# Patient Record
Sex: Female | Born: 1935 | Race: White | Hispanic: No | Marital: Married | Smoking: Former smoker
Health system: Southern US, Community
[De-identification: ages and names within clinical notes are randomized; demographics above are authoritative.]

## PROBLEM LIST (undated history)

## (undated) DIAGNOSIS — D239 Other benign neoplasm of skin, unspecified: Secondary | ICD-10-CM

## (undated) DIAGNOSIS — D649 Anemia, unspecified: Secondary | ICD-10-CM

## (undated) DIAGNOSIS — F32A Depression, unspecified: Secondary | ICD-10-CM

## (undated) DIAGNOSIS — IMO0001 Reserved for inherently not codable concepts without codable children: Secondary | ICD-10-CM

## (undated) DIAGNOSIS — E039 Hypothyroidism, unspecified: Secondary | ICD-10-CM

## (undated) DIAGNOSIS — T7840XA Allergy, unspecified, initial encounter: Secondary | ICD-10-CM

## (undated) DIAGNOSIS — N184 Chronic kidney disease, stage 4 (severe): Secondary | ICD-10-CM

## (undated) DIAGNOSIS — K219 Gastro-esophageal reflux disease without esophagitis: Secondary | ICD-10-CM

## (undated) DIAGNOSIS — I509 Heart failure, unspecified: Secondary | ICD-10-CM

## (undated) DIAGNOSIS — Z942 Lung transplant status: Secondary | ICD-10-CM

## (undated) DIAGNOSIS — I10 Essential (primary) hypertension: Secondary | ICD-10-CM

## (undated) DIAGNOSIS — I82409 Acute embolism and thrombosis of unspecified deep veins of unspecified lower extremity: Secondary | ICD-10-CM

## (undated) DIAGNOSIS — F419 Anxiety disorder, unspecified: Secondary | ICD-10-CM

## (undated) DIAGNOSIS — IMO0002 Reserved for concepts with insufficient information to code with codable children: Secondary | ICD-10-CM

## (undated) DIAGNOSIS — J439 Emphysema, unspecified: Secondary | ICD-10-CM

## (undated) DIAGNOSIS — E785 Hyperlipidemia, unspecified: Secondary | ICD-10-CM

## (undated) DIAGNOSIS — R9430 Abnormal result of cardiovascular function study, unspecified: Secondary | ICD-10-CM

## (undated) DIAGNOSIS — F329 Major depressive disorder, single episode, unspecified: Secondary | ICD-10-CM

## (undated) DIAGNOSIS — E2740 Unspecified adrenocortical insufficiency: Secondary | ICD-10-CM

## (undated) DIAGNOSIS — E119 Type 2 diabetes mellitus without complications: Secondary | ICD-10-CM

## (undated) DIAGNOSIS — R011 Cardiac murmur, unspecified: Secondary | ICD-10-CM

## (undated) DIAGNOSIS — I5033 Acute on chronic diastolic (congestive) heart failure: Principal | ICD-10-CM

## (undated) HISTORY — PX: NISSEN FUNDOPLICATION: SHX2091

## (undated) HISTORY — DX: Essential (primary) hypertension: I10

## (undated) HISTORY — DX: Anemia, unspecified: D64.9

## (undated) HISTORY — DX: Hyperlipidemia, unspecified: E78.5

## (undated) HISTORY — DX: Reserved for concepts with insufficient information to code with codable children: IMO0002

## (undated) HISTORY — DX: Abnormal result of cardiovascular function study, unspecified: R94.30

## (undated) HISTORY — DX: Gastro-esophageal reflux disease without esophagitis: K21.9

## (undated) HISTORY — PX: APPENDECTOMY: SHX54

## (undated) HISTORY — DX: Allergy, unspecified, initial encounter: T78.40XA

## (undated) HISTORY — PX: TONSILLECTOMY: SUR1361

## (undated) HISTORY — PX: ABDOMINAL HYSTERECTOMY: SHX81

## (undated) HISTORY — DX: Emphysema, unspecified: J43.9

---

## 2003-04-03 ENCOUNTER — Encounter: Payer: Self-pay | Admitting: Pulmonary Disease

## 2004-02-26 ENCOUNTER — Ambulatory Visit: Payer: Self-pay | Admitting: Family Medicine

## 2004-02-26 ENCOUNTER — Encounter: Admission: RE | Admit: 2004-02-26 | Discharge: 2004-02-26 | Payer: Self-pay | Admitting: Family Medicine

## 2004-03-01 ENCOUNTER — Ambulatory Visit: Payer: Self-pay | Admitting: Family Medicine

## 2004-04-13 ENCOUNTER — Ambulatory Visit: Payer: Self-pay | Admitting: Family Medicine

## 2004-06-08 ENCOUNTER — Ambulatory Visit: Payer: Self-pay | Admitting: Family Medicine

## 2004-08-24 ENCOUNTER — Ambulatory Visit: Payer: Self-pay | Admitting: Family Medicine

## 2004-09-22 ENCOUNTER — Ambulatory Visit: Payer: Self-pay | Admitting: Family Medicine

## 2004-10-06 ENCOUNTER — Ambulatory Visit: Payer: Self-pay | Admitting: Family Medicine

## 2004-12-05 ENCOUNTER — Ambulatory Visit: Payer: Self-pay | Admitting: Internal Medicine

## 2005-01-18 ENCOUNTER — Ambulatory Visit: Payer: Self-pay | Admitting: Family Medicine

## 2006-05-11 ENCOUNTER — Encounter: Payer: Self-pay | Admitting: Family Medicine

## 2006-12-24 DIAGNOSIS — J45909 Unspecified asthma, uncomplicated: Secondary | ICD-10-CM | POA: Insufficient documentation

## 2006-12-24 DIAGNOSIS — I10 Essential (primary) hypertension: Secondary | ICD-10-CM | POA: Insufficient documentation

## 2007-12-30 ENCOUNTER — Encounter: Payer: Self-pay | Admitting: Family Medicine

## 2008-01-03 ENCOUNTER — Encounter: Payer: Self-pay | Admitting: Family Medicine

## 2008-01-24 ENCOUNTER — Encounter: Payer: Self-pay | Admitting: Family Medicine

## 2008-01-24 ENCOUNTER — Encounter: Payer: Self-pay | Admitting: Internal Medicine

## 2008-01-28 ENCOUNTER — Ambulatory Visit: Payer: Self-pay | Admitting: Internal Medicine

## 2008-01-28 DIAGNOSIS — J449 Chronic obstructive pulmonary disease, unspecified: Secondary | ICD-10-CM | POA: Insufficient documentation

## 2008-01-28 DIAGNOSIS — J4489 Other specified chronic obstructive pulmonary disease: Secondary | ICD-10-CM | POA: Insufficient documentation

## 2008-02-08 DIAGNOSIS — J309 Allergic rhinitis, unspecified: Secondary | ICD-10-CM | POA: Insufficient documentation

## 2008-02-08 DIAGNOSIS — E785 Hyperlipidemia, unspecified: Secondary | ICD-10-CM

## 2008-02-11 ENCOUNTER — Ambulatory Visit: Payer: Self-pay | Admitting: Internal Medicine

## 2008-03-06 ENCOUNTER — Ambulatory Visit: Payer: Self-pay | Admitting: Internal Medicine

## 2008-03-26 ENCOUNTER — Encounter: Payer: Self-pay | Admitting: Family Medicine

## 2008-04-22 ENCOUNTER — Encounter: Payer: Self-pay | Admitting: Family Medicine

## 2008-04-24 ENCOUNTER — Telehealth: Payer: Self-pay | Admitting: Internal Medicine

## 2008-04-30 ENCOUNTER — Telehealth (INDEPENDENT_AMBULATORY_CARE_PROVIDER_SITE_OTHER): Payer: Self-pay | Admitting: *Deleted

## 2008-05-06 ENCOUNTER — Telehealth: Payer: Self-pay | Admitting: Internal Medicine

## 2008-05-11 ENCOUNTER — Telehealth (INDEPENDENT_AMBULATORY_CARE_PROVIDER_SITE_OTHER): Payer: Self-pay | Admitting: *Deleted

## 2008-05-13 ENCOUNTER — Ambulatory Visit: Payer: Self-pay | Admitting: Internal Medicine

## 2008-05-18 LAB — HM MAMMOGRAPHY

## 2008-06-19 ENCOUNTER — Telehealth: Payer: Self-pay | Admitting: Family Medicine

## 2008-06-30 ENCOUNTER — Ambulatory Visit: Payer: Self-pay | Admitting: Family Medicine

## 2008-06-30 DIAGNOSIS — E039 Hypothyroidism, unspecified: Secondary | ICD-10-CM

## 2008-06-30 DIAGNOSIS — F341 Dysthymic disorder: Secondary | ICD-10-CM | POA: Insufficient documentation

## 2008-07-01 ENCOUNTER — Telehealth: Payer: Self-pay | Admitting: Family Medicine

## 2008-07-22 ENCOUNTER — Ambulatory Visit: Payer: Self-pay | Admitting: Family Medicine

## 2008-07-22 ENCOUNTER — Telehealth: Payer: Self-pay | Admitting: Family Medicine

## 2008-07-22 DIAGNOSIS — E876 Hypokalemia: Secondary | ICD-10-CM

## 2008-07-22 DIAGNOSIS — R252 Cramp and spasm: Secondary | ICD-10-CM | POA: Insufficient documentation

## 2008-07-23 ENCOUNTER — Encounter: Payer: Self-pay | Admitting: Family Medicine

## 2008-07-23 ENCOUNTER — Telehealth: Payer: Self-pay | Admitting: Family Medicine

## 2008-09-09 ENCOUNTER — Ambulatory Visit: Payer: Self-pay | Admitting: Family Medicine

## 2008-09-09 DIAGNOSIS — R350 Frequency of micturition: Secondary | ICD-10-CM

## 2008-09-09 DIAGNOSIS — T50995A Adverse effect of other drugs, medicaments and biological substances, initial encounter: Secondary | ICD-10-CM

## 2008-09-09 DIAGNOSIS — E559 Vitamin D deficiency, unspecified: Secondary | ICD-10-CM | POA: Insufficient documentation

## 2008-09-09 LAB — CONVERTED CEMR LAB
Bilirubin Urine: NEGATIVE
CO2: 28 meq/L (ref 19–32)
Chloride: 105 meq/L (ref 96–112)
Glucose, Urine, Semiquant: NEGATIVE
Ketones, urine, test strip: NEGATIVE
Nitrite: NEGATIVE
Potassium: 4.3 meq/L (ref 3.5–5.1)
Protein, U semiquant: NEGATIVE
Sodium: 140 meq/L (ref 135–145)
Specific Gravity, Urine: 1.02
Urobilinogen, UA: 0.2
pH: 5

## 2008-09-22 LAB — CONVERTED CEMR LAB
ALT: 16 units/L (ref 0–35)
AST: 22 units/L (ref 0–37)
Albumin: 3.8 g/dL (ref 3.5–5.2)
Alkaline Phosphatase: 67 units/L (ref 39–117)
BUN: 18 mg/dL (ref 6–23)
Basophils Absolute: 0 10*3/uL (ref 0.0–0.1)
Basophils Relative: 0.2 % (ref 0.0–3.0)
Bilirubin, Direct: 0.2 mg/dL (ref 0.0–0.3)
CO2: 29 meq/L (ref 19–32)
Calcium: 9.1 mg/dL (ref 8.4–10.5)
Chloride: 109 meq/L (ref 96–112)
Cholesterol: 163 mg/dL (ref 0–200)
Creatinine, Ser: 0.9 mg/dL (ref 0.4–1.2)
Eosinophils Absolute: 0.2 10*3/uL (ref 0.0–0.7)
Eosinophils Relative: 1.9 % (ref 0.0–5.0)
GFR calc non Af Amer: 65.64 mL/min (ref >60)
Glucose, Bld: 99 mg/dL (ref 70–99)
HCT: 43.9 % (ref 36.0–46.0)
HDL: 83.7 mg/dL (ref >39.00)
Hemoglobin: 15.1 g/dL — ABNORMAL HIGH (ref 12.0–15.0)
LDL Cholesterol: 70 mg/dL (ref 0–99)
Lymphocytes Relative: 20.1 % (ref 12.0–46.0)
Lymphs Abs: 1.7 10*3/uL (ref 0.7–4.0)
MCHC: 34.4 g/dL (ref 30.0–36.0)
MCV: 95.4 fL (ref 78.0–100.0)
Monocytes Absolute: 0.6 10*3/uL (ref 0.1–1.0)
Monocytes Relative: 7.5 % (ref 3.0–12.0)
Neutro Abs: 6 10*3/uL (ref 1.4–7.7)
Neutrophils Relative %: 70.3 % (ref 43.0–77.0)
Platelets: 212 10*3/uL (ref 150.0–400.0)
Potassium: 3.5 meq/L (ref 3.5–5.1)
RBC: 4.6 M/uL (ref 3.87–5.11)
RDW: 14.9 % — ABNORMAL HIGH (ref 11.5–14.6)
Sodium: 142 meq/L (ref 135–145)
TSH: 0.03 microintl units/mL — ABNORMAL LOW (ref 0.35–5.50)
Total Bilirubin: 0.8 mg/dL (ref 0.3–1.2)
Total CHOL/HDL Ratio: 2
Total Protein: 7 g/dL (ref 6.0–8.3)
Triglycerides: 48 mg/dL (ref 0.0–149.0)
VLDL: 9.6 mg/dL (ref 0.0–40.0)
Vit D, 25-Hydroxy: 30 ng/mL (ref 30–89)
WBC: 8.5 10*3/uL (ref 4.5–10.5)

## 2008-09-28 ENCOUNTER — Encounter: Payer: Self-pay | Admitting: Family Medicine

## 2008-10-13 ENCOUNTER — Telehealth: Payer: Self-pay | Admitting: Family Medicine

## 2008-10-29 ENCOUNTER — Ambulatory Visit: Payer: Self-pay | Admitting: Family Medicine

## 2008-11-02 ENCOUNTER — Telehealth: Payer: Self-pay | Admitting: Family Medicine

## 2008-12-23 ENCOUNTER — Telehealth: Payer: Self-pay | Admitting: Family Medicine

## 2009-01-14 ENCOUNTER — Telehealth (INDEPENDENT_AMBULATORY_CARE_PROVIDER_SITE_OTHER): Payer: Self-pay | Admitting: *Deleted

## 2009-02-09 ENCOUNTER — Ambulatory Visit: Payer: Self-pay | Admitting: Family Medicine

## 2009-02-09 DIAGNOSIS — R05 Cough: Secondary | ICD-10-CM

## 2009-02-09 DIAGNOSIS — Z87891 Personal history of nicotine dependence: Secondary | ICD-10-CM

## 2009-03-09 ENCOUNTER — Ambulatory Visit: Payer: Self-pay | Admitting: Family Medicine

## 2009-03-09 DIAGNOSIS — J019 Acute sinusitis, unspecified: Secondary | ICD-10-CM | POA: Insufficient documentation

## 2009-04-19 ENCOUNTER — Telehealth: Payer: Self-pay | Admitting: Family Medicine

## 2009-05-05 ENCOUNTER — Telehealth: Payer: Self-pay | Admitting: Family Medicine

## 2009-05-19 ENCOUNTER — Telehealth: Payer: Self-pay | Admitting: Family Medicine

## 2009-05-27 ENCOUNTER — Encounter: Payer: Self-pay | Admitting: Family Medicine

## 2009-06-09 ENCOUNTER — Ambulatory Visit: Payer: Self-pay | Admitting: Family Medicine

## 2009-06-17 ENCOUNTER — Ambulatory Visit: Payer: Self-pay | Admitting: Pulmonary Disease

## 2009-06-17 DIAGNOSIS — J438 Other emphysema: Secondary | ICD-10-CM | POA: Insufficient documentation

## 2009-06-17 DIAGNOSIS — J9610 Chronic respiratory failure, unspecified whether with hypoxia or hypercapnia: Secondary | ICD-10-CM

## 2009-06-21 ENCOUNTER — Telehealth: Payer: Self-pay | Admitting: Pulmonary Disease

## 2009-06-22 ENCOUNTER — Telehealth: Payer: Self-pay | Admitting: Pulmonary Disease

## 2009-06-24 ENCOUNTER — Telehealth: Payer: Self-pay | Admitting: Pulmonary Disease

## 2009-06-29 ENCOUNTER — Telehealth: Payer: Self-pay | Admitting: Pulmonary Disease

## 2009-07-01 ENCOUNTER — Telehealth: Payer: Self-pay | Admitting: Family Medicine

## 2009-07-28 ENCOUNTER — Encounter (HOSPITAL_COMMUNITY): Admission: RE | Admit: 2009-07-28 | Discharge: 2009-11-05 | Payer: Self-pay | Admitting: Pulmonary Disease

## 2009-07-30 ENCOUNTER — Telehealth: Payer: Self-pay | Admitting: Family Medicine

## 2009-09-15 ENCOUNTER — Ambulatory Visit: Payer: Self-pay | Admitting: Family Medicine

## 2009-09-15 DIAGNOSIS — M949 Disorder of cartilage, unspecified: Secondary | ICD-10-CM

## 2009-09-15 DIAGNOSIS — M899 Disorder of bone, unspecified: Secondary | ICD-10-CM | POA: Insufficient documentation

## 2009-09-17 ENCOUNTER — Ambulatory Visit: Payer: Self-pay | Admitting: Pulmonary Disease

## 2009-09-21 ENCOUNTER — Telehealth: Payer: Self-pay | Admitting: Pulmonary Disease

## 2009-09-23 ENCOUNTER — Encounter: Payer: Self-pay | Admitting: Pulmonary Disease

## 2009-10-04 ENCOUNTER — Telehealth (INDEPENDENT_AMBULATORY_CARE_PROVIDER_SITE_OTHER): Payer: Self-pay | Admitting: *Deleted

## 2009-10-08 ENCOUNTER — Telehealth: Payer: Self-pay | Admitting: Family Medicine

## 2009-10-14 ENCOUNTER — Telehealth (INDEPENDENT_AMBULATORY_CARE_PROVIDER_SITE_OTHER): Payer: Self-pay | Admitting: *Deleted

## 2009-10-21 ENCOUNTER — Ambulatory Visit: Payer: Self-pay | Admitting: Pulmonary Disease

## 2009-10-21 DIAGNOSIS — R0602 Shortness of breath: Secondary | ICD-10-CM

## 2009-10-22 ENCOUNTER — Telehealth (INDEPENDENT_AMBULATORY_CARE_PROVIDER_SITE_OTHER): Payer: Self-pay | Admitting: *Deleted

## 2009-10-25 ENCOUNTER — Ambulatory Visit: Payer: Self-pay

## 2009-10-25 ENCOUNTER — Ambulatory Visit: Payer: Self-pay | Admitting: Cardiology

## 2009-10-25 ENCOUNTER — Ambulatory Visit (HOSPITAL_COMMUNITY): Admission: RE | Admit: 2009-10-25 | Discharge: 2009-10-25 | Payer: Self-pay | Admitting: Pulmonary Disease

## 2009-10-25 ENCOUNTER — Encounter: Payer: Self-pay | Admitting: Pulmonary Disease

## 2009-11-03 ENCOUNTER — Encounter: Payer: Self-pay | Admitting: Pulmonary Disease

## 2009-11-08 ENCOUNTER — Encounter: Payer: Self-pay | Admitting: Pulmonary Disease

## 2009-11-09 ENCOUNTER — Encounter (HOSPITAL_COMMUNITY): Admission: RE | Admit: 2009-11-09 | Discharge: 2010-02-07 | Payer: Self-pay | Admitting: Pulmonary Disease

## 2009-11-10 ENCOUNTER — Telehealth: Payer: Self-pay | Admitting: Family Medicine

## 2009-11-10 ENCOUNTER — Telehealth (INDEPENDENT_AMBULATORY_CARE_PROVIDER_SITE_OTHER): Payer: Self-pay | Admitting: *Deleted

## 2009-11-11 ENCOUNTER — Encounter: Payer: Self-pay | Admitting: Family Medicine

## 2009-11-17 ENCOUNTER — Ambulatory Visit: Payer: Self-pay | Admitting: Family Medicine

## 2009-11-18 ENCOUNTER — Ambulatory Visit: Payer: Self-pay | Admitting: Pulmonary Disease

## 2009-11-19 ENCOUNTER — Telehealth (INDEPENDENT_AMBULATORY_CARE_PROVIDER_SITE_OTHER): Payer: Self-pay | Admitting: *Deleted

## 2009-11-19 ENCOUNTER — Ambulatory Visit: Payer: Self-pay | Admitting: Family Medicine

## 2009-11-22 ENCOUNTER — Telehealth: Payer: Self-pay | Admitting: Pulmonary Disease

## 2009-11-23 ENCOUNTER — Telehealth: Payer: Self-pay | Admitting: Family Medicine

## 2009-11-25 ENCOUNTER — Telehealth: Payer: Self-pay | Admitting: Family Medicine

## 2009-11-26 ENCOUNTER — Encounter: Payer: Self-pay | Admitting: Family Medicine

## 2009-12-06 ENCOUNTER — Encounter: Payer: Self-pay | Admitting: Family Medicine

## 2009-12-09 ENCOUNTER — Telehealth (INDEPENDENT_AMBULATORY_CARE_PROVIDER_SITE_OTHER): Payer: Self-pay | Admitting: *Deleted

## 2009-12-10 ENCOUNTER — Encounter (INDEPENDENT_AMBULATORY_CARE_PROVIDER_SITE_OTHER): Payer: Self-pay | Admitting: *Deleted

## 2009-12-13 ENCOUNTER — Telehealth (INDEPENDENT_AMBULATORY_CARE_PROVIDER_SITE_OTHER): Payer: Self-pay | Admitting: *Deleted

## 2009-12-15 ENCOUNTER — Ambulatory Visit: Payer: Self-pay | Admitting: Gastroenterology

## 2009-12-24 ENCOUNTER — Telehealth: Payer: Self-pay | Admitting: Family Medicine

## 2009-12-24 ENCOUNTER — Ambulatory Visit: Payer: Self-pay | Admitting: Family Medicine

## 2009-12-27 ENCOUNTER — Telehealth (INDEPENDENT_AMBULATORY_CARE_PROVIDER_SITE_OTHER): Payer: Self-pay | Admitting: *Deleted

## 2009-12-29 ENCOUNTER — Ambulatory Visit: Payer: Self-pay | Admitting: Gastroenterology

## 2009-12-29 ENCOUNTER — Encounter: Payer: Self-pay | Admitting: Pulmonary Disease

## 2009-12-29 LAB — HM COLONOSCOPY

## 2009-12-31 ENCOUNTER — Encounter: Payer: Self-pay | Admitting: Gastroenterology

## 2010-01-04 ENCOUNTER — Encounter: Payer: Self-pay | Admitting: Family Medicine

## 2010-01-07 ENCOUNTER — Telehealth (INDEPENDENT_AMBULATORY_CARE_PROVIDER_SITE_OTHER): Payer: Self-pay | Admitting: *Deleted

## 2010-01-10 ENCOUNTER — Telehealth: Payer: Self-pay | Admitting: Family Medicine

## 2010-01-13 ENCOUNTER — Telehealth: Payer: Self-pay | Admitting: Pulmonary Disease

## 2010-01-14 ENCOUNTER — Telehealth (INDEPENDENT_AMBULATORY_CARE_PROVIDER_SITE_OTHER): Payer: Self-pay | Admitting: *Deleted

## 2010-01-17 ENCOUNTER — Telehealth (INDEPENDENT_AMBULATORY_CARE_PROVIDER_SITE_OTHER): Payer: Self-pay | Admitting: *Deleted

## 2010-01-17 ENCOUNTER — Ambulatory Visit: Payer: Self-pay | Admitting: Pulmonary Disease

## 2010-02-14 ENCOUNTER — Ambulatory Visit: Payer: Self-pay | Admitting: Pulmonary Disease

## 2010-02-14 DIAGNOSIS — J441 Chronic obstructive pulmonary disease with (acute) exacerbation: Secondary | ICD-10-CM

## 2010-02-15 ENCOUNTER — Encounter (HOSPITAL_COMMUNITY)
Admission: RE | Admit: 2010-02-15 | Discharge: 2010-04-15 | Payer: Self-pay | Source: Home / Self Care | Attending: Pulmonary Disease | Admitting: Pulmonary Disease

## 2010-03-16 ENCOUNTER — Encounter: Payer: Self-pay | Admitting: Family Medicine

## 2010-03-25 ENCOUNTER — Telehealth: Payer: Self-pay | Admitting: Pulmonary Disease

## 2010-03-29 ENCOUNTER — Telehealth (INDEPENDENT_AMBULATORY_CARE_PROVIDER_SITE_OTHER): Payer: Self-pay | Admitting: *Deleted

## 2010-03-29 ENCOUNTER — Telehealth: Payer: Self-pay | Admitting: Family Medicine

## 2010-04-07 ENCOUNTER — Encounter: Payer: Self-pay | Admitting: Family Medicine

## 2010-04-17 HISTORY — PX: OTHER SURGICAL HISTORY: SHX169

## 2010-05-15 LAB — CONVERTED CEMR LAB
ALT: 15 units/L (ref 0–35)
AST: 23 units/L (ref 0–37)
Albumin: 4 g/dL (ref 3.5–5.2)
Alkaline Phosphatase: 50 units/L (ref 39–117)
BUN: 17 mg/dL (ref 6–23)
Basophils Absolute: 0.1 10*3/uL (ref 0.0–0.1)
Basophils Relative: 0.8 % (ref 0.0–3.0)
Bilirubin Urine: NEGATIVE
Bilirubin, Direct: 0.2 mg/dL (ref 0.0–0.3)
Blood in Urine, dipstick: NEGATIVE
CO2: 30 meq/L (ref 19–32)
Calcium: 9.7 mg/dL (ref 8.4–10.5)
Chloride: 103 meq/L (ref 96–112)
Cholesterol: 168 mg/dL (ref 0–200)
Creatinine, Ser: 0.8 mg/dL (ref 0.4–1.2)
Eosinophils Absolute: 0.1 10*3/uL (ref 0.0–0.7)
Eosinophils Relative: 1.5 % (ref 0.0–5.0)
GFR calc non Af Amer: 70.87 mL/min (ref >60)
Glucose, Bld: 91 mg/dL (ref 70–99)
Glucose, Urine, Semiquant: NEGATIVE
HCT: 45 % (ref 36.0–46.0)
HDL: 84.6 mg/dL (ref >39.00)
Hemoglobin: 15.4 g/dL — ABNORMAL HIGH (ref 12.0–15.0)
Ketones, urine, test strip: NEGATIVE
LDL Cholesterol: 73 mg/dL (ref 0–99)
Lymphocytes Relative: 17.5 % (ref 12.0–46.0)
Lymphs Abs: 1.6 10*3/uL (ref 0.7–4.0)
MCHC: 34.1 g/dL (ref 30.0–36.0)
MCV: 93.7 fL (ref 78.0–100.0)
Monocytes Absolute: 0.7 10*3/uL (ref 0.1–1.0)
Monocytes Relative: 8 % (ref 3.0–12.0)
Neutro Abs: 6.7 10*3/uL (ref 1.4–7.7)
Neutrophils Relative %: 72.2 % (ref 43.0–77.0)
Nitrite: NEGATIVE
Platelets: 211 10*3/uL (ref 150.0–400.0)
Potassium: 4.4 meq/L (ref 3.5–5.1)
Protein, U semiquant: NEGATIVE
RBC: 4.8 M/uL (ref 3.87–5.11)
RDW: 14.8 % — ABNORMAL HIGH (ref 11.5–14.6)
Sodium: 142 meq/L (ref 135–145)
Specific Gravity, Urine: 1.015
TSH: 0.09 microintl units/mL — ABNORMAL LOW (ref 0.35–5.50)
Total Bilirubin: 1 mg/dL (ref 0.3–1.2)
Total CHOL/HDL Ratio: 2
Total Protein: 7.3 g/dL (ref 6.0–8.3)
Triglycerides: 50 mg/dL (ref 0.0–149.0)
Urobilinogen, UA: 0.2
VLDL: 10 mg/dL (ref 0.0–40.0)
Vit D, 25-Hydroxy: 38 ng/mL (ref 30–89)
WBC Urine, dipstick: NEGATIVE
WBC: 9.2 10*3/uL (ref 4.5–10.5)
pH: 6.5

## 2010-05-17 NOTE — Progress Notes (Signed)
Summary: approval for lung trnasplant eval at [facility]  Phone Note Call from Patient   Summary of Call: call regarding transplant lung  Initial call taken by: Pura Spice, RN,  November 10, 2009 3:33 PM  Follow-up for Phone Call        pt called to report she has been approved for lung transplant evaluation appt in sept . pt reports will need colonscopy ,some immunizations .    Follow-up by: Pura Spice, RN,  November 10, 2009 3:42 PM  Additional Follow-up for Phone Call Additional follow up Details #1::        per dr Scotty Court have pt sch appt for immunizatins and refer to Dr Christella Hartigan for colonscopy. pt aware Additional Follow-up by: Pura Spice, RN,  November 11, 2009 7:40 AM

## 2010-05-17 NOTE — Assessment & Plan Note (Signed)
Summary: rov for severe emphysema   Primary Provider/Referring Provider:  Cecille Rubin  CC:  4 month follow up. pt states her breathing is the same form last visit. Pt states she has been having some acid reflux and is wondering what kind of meds can she take for that. Pt denies any cough. .  History of Present Illness: the pt comes in today for f/u of her severe emphysema with chronic respiratory failure.  She is participating in pulmonary rehab, and feels that her exertional tolerance is near her usual baseline.  She is being evaluated at [facility] for possible lung tx, and is optimistic this will work out.  She is denies cough or congestion, but is having some reflux symptoms.  I have asked her to try otc prilosec or pepcid.  Current Medications (verified): 1)  Levothyroxine Sodium 112 Mcg Tabs (Levothyroxine Sodium) .... Once Daily 2)  Lovastatin 20 Mg Tabs (Lovastatin) .... Take 1 Tablet By Mouth Once A Day At Bedtime 3)  Oxygen 4l/m  Inogen .... Use Cont Except At Bedtime Then Removes 4)  Citalopram Hydrobromide 20 Mg Tabs (Citalopram Hydrobromide) .... Take 1 Tablet By Mouth Once A Day 5)  Temazepam 30 Mg Caps (Temazepam) .... Take 1 Capsule By Mouth Once A Day As Needed Sleep 6)  Bayer Aspirin 325 Mg Tabs (Aspirin) .... Take 1 Tablet By Mouth Once A Day 7)  Calcium Carbonate-Vitamin D 600-400 Mg-Unit  Tabs (Calcium Carbonate-Vitamin D) .... Take 1 Tablet By Mouth Two Times A Day 8)  Spiriva Handihaler 18 Mcg Caps (Tiotropium Bromide Monohydrate) .Marland Kitchen.. 1 Puff Daily This Was Sent To [email or web address] 9)  Fosamax 70 Mg Tabs (Alendronate Sodium) .... Take 1 By Mouth Weekly 10)  Diltiazem Hcl 90 Mg Tabs (Diltiazem Hcl) .... Take 1 Tablet By Mouth Once A Day 11)  Symbicort 160-4.5 Mcg/act Aero (Budesonide-Formoterol Fumarate) .... 2 Inhalantions A M and  Pm For Copd 12)  Ventolin Hfa 108 (90 Base) Mcg/act Aers (Albuterol Sulfate) .... 2 Puffs Three Times A Day As Needed 13)  Triamterene-Hctz  37.5-25 Mg Tabs (Triamterene-Hctz) .Marland Kitchen.. 1 By Mouth Once Daily As Needed Swelling.  Allergies (verified): 1)  ! Pcn 2)  ! Neosporin  Review of Systems       The patient complains of shortness of breath with activity, acid heartburn, and indigestion.  The patient denies shortness of breath at rest, productive cough, non-productive cough, coughing up blood, chest pain, irregular heartbeats, loss of appetite, weight change, abdominal pain, difficulty swallowing, sore throat, tooth/dental problems, headaches, nasal congestion/difficulty breathing through nose, sneezing, itching, ear ache, anxiety, depression, hand/feet swelling, joint stiffness or pain, rash, change in color of mucus, and fever.    Vital Signs:  Patient profile:   75 year old female Height:      60 inches Weight:      122.38 pounds BMI:     23.99 O2 Sat:      90 % on 4 L/min Temp:     97.8 degrees F oral Pulse rate:   60 / minute BP sitting:   132 / 80  (right arm) Cuff size:   regular  Vitals Entered By: Carver Fila (January 17, 2010 3:03 PM)  O2 Flow:  4 L/min CC: 4 month follow up. pt states her breathing is the same form last visit. Pt states she has been having some acid reflux and is wondering what kind of meds can she take for that. Pt denies any cough.  Comments meds and  allergies updated Phone number updated Carver Fila  January 17, 2010 3:03 PM    Physical Exam  General:  wd female in nad Lungs:  mild decrease in bs, o/w clear Heart:  rrr, no mrg Extremities:  no edema or cyanosis Neurologic:  alert and oriented, moves all 4.   Impression & Recommendations:  Problem # 1:  EMPHYSEMA (ICD-492.8) the pt has severe emphysema with chronic respiratory failure.  She is doing as well as can be expected on her current regimen, and is participating in the pulmonary rehab program.  She has been referred to [facility] for possible lung transplant, with her evaluation ongoing currently.  I have asked her to stay on her  meds, and to keep as active as possible.  Other Orders: Est. Patient Level III (29562) Flu Vaccine 45yrs + MEDICARE PATIENTS (Z3086) Administration Flu vaccine - MCR (V7846)  Patient Instructions: 1)  no change in meds. 2)  will give you the flu shot today 3)  followup with me in 4mos or sooner if having problems        Flu Vaccine Consent Questions     Do you have a history of severe allergic reactions to this vaccine? no    Any prior history of allergic reactions to egg and/or gelatin? no    Do you have a sensitivity to the preservative Thimersol? no    Do you have a past history of Guillan-Barre Syndrome? no    Do you currently have an acute febrile illness? no    Have you ever had a severe reaction to latex? no    Vaccine information given and explained to patient? yes    Are you currently pregnant? no    Lot Number:[id number]   Exp Date:10/15/2010   Site Given  Left Deltoid IMdflu  Carver Fila  January 17, 2010 4:09 PM

## 2010-05-17 NOTE — Progress Notes (Signed)
Summary: FYI and thank you  Phone Note Call from Patient   Caller: Nancy Blair Call For: clance Summary of Call: Spoke with Nancy Blair and she just wanted to let Dr. Shelle Iron know that she has completed her work-up at [facility] for her transplant and she is set to do the 23 day rehab program there and this will be the last step in the process. After completion of the rehab program she will go on the transplant list. Nancy Blair just wanted to let [facility] know this and to thank him for referring her to [facility], I advised I will forward message to Dr. Shelle Iron.  Initial call taken by: Carron Curie CMA,  March 25, 2010 10:53 AM  Follow-up for Phone Call        noted. Follow-up by: Barbaraann Share MD,  March 25, 2010 4:15 PM

## 2010-05-17 NOTE — Progress Notes (Signed)
Summary: poredisone ?  Phone Note Call from Patient Call back at Home Phone [phone]   Caller: Patient Call For: clance Summary of Call: does pt need to continue her predisone since her xray was clear Initial call taken by: Lacinda Axon,  October 22, 2009 12:22 PM  Follow-up for Phone Call        Spoke with pt and advised that she needs to take the prednisone as [facility] prescribed.  Pt verbalized understanding. Follow-up by: Vernie Murders,  October 22, 2009 1:41 PM

## 2010-05-17 NOTE — Progress Notes (Signed)
Summary: refill temazepam  Phone Note Call from Patient   Caller: Patient Summary of Call: wants refills temazepam   Initial call taken by: Pura Spice, RN,  November 23, 2009 1:14 PM  Follow-up for Phone Call        notified pt and was in question about rx dr Scotty Court gave her on September 15 2009 for temazepam. pt stated she lost rx.  Follow-up by: Pura Spice, RN,  November 23, 2009 1:15 PM    Prescriptions: TEMAZEPAM 30 MG CAPS (TEMAZEPAM) Take 1 capsule by mouth once a day as needed sleep  #30 x 1   Entered by:   Pura Spice, RN   Authorized by:   Judithann Sheen MD   Signed by:   Pura Spice, RN on 11/23/2009   Method used:   Telephoned to ...       [facility] [address] (retail)       [phone]       [place], [place]  [address]       Ph: [phone]       Fax: [phone]   RxID:   [id number]

## 2010-05-17 NOTE — Progress Notes (Signed)
Summary: returning a call  Phone Note Call from Patient Call back at Home Phone [phone]   Caller: [facility] mail Summary of Call: returning a call. leave a message. or call her within the next 45 minutes. Initial call taken by: Warnell Forester,  October 08, 2009 1:04 PM  Follow-up for Phone Call        I think you have sent this to the wrong MD.  I am not trying to contact patient. Follow-up by: Barbaraann Share MD,  October 13, 2009 5:12 PM

## 2010-05-17 NOTE — Progress Notes (Signed)
Summary: order for Oximizer - waiting on fax  Phone Note Call from Patient   Caller: Patient Call For: clance Summary of Call: need order sent to [facility] for oximizer. Initial call taken by: Rickard Patience,  January 13, 2010 1:20 PM  Follow-up for Phone Call        called and spoke with pt.  pt would like an order for an oximizer sent to [facility].  Please advise if ok or not.  Thank you.  Aundra Millet Reynolds LPN  January 13, 2010 1:40 PM   Additional Follow-up for Phone Call Additional follow up Details #1::        I need to know what the issue is.  We do not usually prescribe oximizers unless pts need more than 5-6 liters and having sat issues.  Is she having sat issues?  if so, needs ov with me so we can check these.  Is she having issues at rehab?  then they need to send me notes  Additional Follow-up by: Barbaraann Share MD,  January 13, 2010 5:37 PM    Additional Follow-up for Phone Call Additional follow up Details #2::    [facility] Gweneth Dimitri RN  January 14, 2010 9:37 AM  Pt returned call.  States her o2 sats have dropped as low as 71% at rehab so they have her on 15L via mask.  They are wanting to try to use the oximizer and try to decrease the amount of o2 she is needing.  Pt requesting this today as she is now going to rehab 3 x wk.  Will call pulmonary rehab to request notes for KC's review.  [facility] pulm rehab, spoke with [facility] and was told she would have Boneta Lucks return call about getting records.  Will send message to [facility] so he is aware.  Gweneth Dimitri RN  January 14, 2010 10:04 AM  Geraldine Contras called back and said pt has prev had an oximizer for awhile now but pt's family members cleaned her house this last weekend and accidently threw the oximizer away- therefore that is why pt is requesting order for another one.  Geraldine Contras also stated she will get Boneta Lucks to fax over recent records on pt.  Waiting on fax. Aundra Millet Reynolds LPN  January 14, 2010 10:10 AM   received records from [facility] and  put in [facility] very important look at folder.  Aundra Millet Reynolds LPN  January 14, 2010 12:03 PM   Additional Follow-up for Phone Call Additional follow up Details #3:: Details for Additional Follow-up Action Taken: ok to order oximizer.  order sent to [facility] Additional Follow-up by: Barbaraann Share MD,  January 14, 2010 5:24 PM

## 2010-05-17 NOTE — Miscellaneous (Signed)
Summary: Pulmonary/[place]  Pulmonary/[place]   Imported By: Lester Jayton 09/28/2009 13:50:16  _____________________________________________________________________  External Attachment:    Type:   Image     Comment:   External Document

## 2010-05-17 NOTE — Progress Notes (Signed)
Summary: CMN- correction needed  Phone Note From Other Clinic   Caller: HEATHER W/ [employer] Call For: [facility] Summary of Call: call re: CMN. says she needs section "c" info- answer can match question # 5. then initial (kc's) and today's date. then fax back to [employer] [phone]. caller's # is [phone] (please check behind me- fax # may be caller's # and vise vesa. thanks. Initial call taken by: Tivis Ringer, CNA,  June 22, 2009 4:17 PM  Follow-up for Phone Call        paper is in [facility] very important look at folder for him to fill out part C.  Aundra Millet Reynolds LPN  June 23, 3084 8:25 AM   Additional Follow-up for Phone Call Additional follow up Details #1::        noted. Additional Follow-up by: Barbaraann Share MD,  June 23, 2009 6:12 PM

## 2010-05-17 NOTE — Letter (Signed)
Summary: Generic Letter   at [facility]  [address] [place], [place] [address]   Phone: [phone]  Fax: [phone]    05/27/2009    To Whom It May Concern:  I am writing this letter to express that my patient, [place], has multiple pulmonary problems. She is on oxygen and carries with her a small medical bag with her oxygenator in it. It is imperative that she has this bag with her all times.  Please feel free to contact me with any questions or concerns at [phone].           Sincerely,      Dr. Hosie Spangle. Stafford,Jr, MD

## 2010-05-17 NOTE — Miscellaneous (Signed)
Summary: Pulm Rehab/[place]  Pulm Rehab/[place]   Imported By: Sherian Rein 11/10/2009 11:07:29  _____________________________________________________________________  External Attachment:    Type:   Image     Comment:   External Document

## 2010-05-17 NOTE — Progress Notes (Signed)
Summary: Levothyroxine change  Phone Note From Other Clinic   Caller: Malena Peer, MD Summary of Call: Patient is seeing the Transplant Team at [facility] for possibility of lung transplant. During some routine blood work there it was uncovered that her TSH was suppressed at 0.18 and her free T4 was elevated at 1.23. they will fax [place] the labs then we will adjust Synthroid dose at that point Initial call taken by: Danise Edge MD,  January 10, 2010 4:56 PM  Follow-up for Phone Call        Any sign of the lab results from [facility] yet? If no please call patient and give her verbal report and we will need to decrease her Levothyroxine to 112 micrograms once daily and recheck tsh and free T4 in 8 weeks Follow-up by: Danise Edge MD,  January 11, 2010 2:21 PM  Additional Follow-up for Phone Call Additional follow up Details #1::        Patient informed Additional Follow-up by: Josph Macho RMA,  January 11, 2010 3:57 PM    New/Updated Medications: LEVOTHYROXINE SODIUM 112 MCG TABS (LEVOTHYROXINE SODIUM) once daily Prescriptions: LEVOTHYROXINE SODIUM 112 MCG TABS (LEVOTHYROXINE SODIUM) once daily  #30 x 1   Entered by:   Josph Macho RMA   Authorized by:   Danise Edge MD   Signed by:   Josph Macho RMA on 01/11/2010   Method used:   Electronically to        [facility] [address] (retail)       [phone]       [place], [place]  [address]       Ph: [phone]       Fax: [phone]   RxID:   [id number]  Left message for pt to return my call/ CF

## 2010-05-17 NOTE — Assessment & Plan Note (Signed)
Summary: BREATHING PROBLEMS // RS   Vital Signs:  Patient profile:   75 year old female Weight:      117 pounds BMI:     22.93 O2 Sat:      92 % on Room air Temp:     97.7 degrees F Pulse rate:   64 / minute BP sitting:   120 / 82  (left arm) Cuff size:   regular  Vitals Entered By: Pura Spice, RN (June 09, 2009 11:41 AM)  O2 Flow:  Room air CC: SOB uses  02 at home on 2L . [facility] notified to deliver "eclipse O2 machine " for pt.    History of Present Illness: this 75 year old white very female who has had COPD for some time and has been a smoker in the past COPD has worsened to the point that she needs O2 at 2 L most of the time today the pulse ox was 92% with 2 L of oxygen but after walking pO2 88 After some time and attempted to refer patient to a pulmonologist but she has refused Because of a bad experience previously he had worse she has been doing relatively well but her symptoms have increased in severity over the past 4-6 weeks and is here time she was able to go on a cruise utilized in the nasal oxygen We both agree that she needs to be referred and further treatment will continue same treatment until seen by pulmonologist Hypertension his control and no other complaints     Allergies: 1)  ! Pcn 2)  ! Neosporin  Past History:  Past Medical History: Last updated: 01/28/2008 Asthma Hypertension Allergic Rhinitis Hyperlipidemia eczema  Past Surgical History: Last updated: 01/28/2008 Appendectomy Hysterectomy Breast Biopsy B9 Vein stripping and ligation, no hx DVT Tonsils  Social History: Last updated: 09/09/2008 Occupation:jewelry sales Married Former Smoker-quit 20 yrs ago  smoked 3 ppd before quitting  Alcohol use-yes Regular exercise-yes  Risk Factors: Smoking Status: quit > 6 months (09/09/2008) Packs/Day: 3.0 (09/09/2008)  Review of Systems      See HPI General:  See HPI; Denies chills, fatigue, fever, loss of appetite,  malaise, sleep disorder, sweats, weakness, and weight loss. Eyes:  Denies blurring, discharge, double vision, eye irritation, eye pain, halos, itching, light sensitivity, red eye, vision loss-1 eye, and vision loss-both eyes. ENT:  Denies decreased hearing, difficulty swallowing, ear discharge, earache, hoarseness, nasal congestion, nosebleeds, postnasal drainage, ringing in ears, sinus pressure, and sore throat. CV:  Denies bluish discoloration of lips or nails, chest pain or discomfort, difficulty breathing at night, difficulty breathing while lying down, fainting, fatigue, leg cramps with exertion, lightheadness, near fainting, palpitations, shortness of breath with exertion, swelling of feet, swelling of hands, and weight gain. Resp:  Complains of shortness of breath; needs oxygen at 2 L. GI:  Denies abdominal pain, bloody stools, change in bowel habits, constipation, dark tarry stools, diarrhea, excessive appetite, gas, hemorrhoids, indigestion, loss of appetite, nausea, vomiting, vomiting blood, and yellowish skin color. GU:  Denies abnormal vaginal bleeding, decreased libido, discharge, dysuria, genital sores, hematuria, incontinence, nocturia, urinary frequency, and urinary hesitancy.  Physical Exam  General:  Well-developed,well-nourished,in no acute distress; alert,appropriate and cooperative throughout examination Head:  Normocephalic and atraumatic without obvious abnormalities. No apparent alopecia or balding. Eyes:  No corneal or conjunctival inflammation noted. EOMI. Perrla. Funduscopic exam benign, without hemorrhages, exudates or papilledema. Vision grossly normal. Ears:  External ear exam shows no significant lesions or deformities.  Otoscopic examination  reveals clear canals, tympanic membranes are intact bilaterally without bulging, retraction, inflammation or discharge. Hearing is grossly normal bilaterally. Nose:  External nasal examination shows no deformity or inflammation.  Nasal mucosa are pink and moist without lesions or exudates. Mouth:  Oral mucosa and oropharynx without lesions or exudates.  Teeth in good repair. Lungs:  diminished breath sounds bilaterally with a sarcoid lesion on expiration Heart:  Normal rate and regular rhythm. S1 and S2 normal without gallop, murmur, click, rub or other extra sounds. Abdomen:  Bowel sounds positive,abdomen soft and non-tender without masses, organomegaly or hernias noted. Extremities:  No clubbing, cyanosis, edema, or deformity noted with normal full range of motion of all joints.     Impression & Recommendations:  Problem # 1:  COPD (ICD-496) Assessment Deteriorated  Her updated medication list for this problem includes:    Spiriva Handihaler 18 Mcg Caps (Tiotropium bromide monohydrate) .Marland Kitchen... 1 puff daily this was sent to [email or web address]    Symbicort 160-4.5 Mcg/act Aero (Budesonide-formoterol fumarate) .Marland Kitchen... 2 inhalantions a m and  pm for copd    Ventolin Hfa 108 (90 Base) Mcg/act Aers (Albuterol sulfate) .Marland Kitchen... 2 puffs three times a day as needed  Orders: Pulmonary Referral (Pulmonary)  Problem # 2:  COUGH (ICD-786.2) Assessment: Unchanged  Problem # 3:  TOBACCO USE, QUIT (ICD-V15.82) Assessment: Improved  Problem # 4:  URINARY FREQUENCY (ICD-788.41) Assessment: Improved  Problem # 5:  HYPERLIPIDEMIA (ICD-272.4) Assessment: Improved  Her updated medication list for this problem includes:    Lovastatin 20 Mg Tabs (Lovastatin) .Marland Kitchen... Take 1 tablet by mouth once a day at bedtime  Problem # 6:  HYPOTHYROIDISM (ICD-244.9) Assessment: Improved  Her updated medication list for this problem includes:    Levothyroxine Sodium 125 Mcg Tabs (Levothyroxine sodium) .Marland Kitchen... Take 1 tablet by mouth once a day  Problem # 7:  ANXIETY DEPRESSION (ICD-300.4) Assessment: Improved  Problem # 8:  HYPERTENSION (ICD-401.9) Assessment: Improved  Her updated medication list for this problem includes:    Diltiazem Hcl 90  Mg Tabs (Diltiazem hcl) .Marland Kitchen... 1 by mouth two times a day  Complete Medication List: 1)  Levothyroxine Sodium 125 Mcg Tabs (Levothyroxine sodium) .... Take 1 tablet by mouth once a day 2)  Lovastatin 20 Mg Tabs (Lovastatin) .... Take 1 tablet by mouth once a day at bedtime 3)  Oxygen 2 L/m [facility]  .... At bedtime as needed 4)  Citalopram Hydrobromide 20 Mg Tabs (Citalopram hydrobromide) .... Take 1 tablet by mouth once a day 5)  Temazepam 30 Mg Caps (Temazepam) .... Take 1 capsule by mouth once a day as needed sleep 6)  Bayer Aspirin 325 Mg Tabs (Aspirin) .... Take 1 tablet by mouth once a day 7)  Calcium Carbonate-vitamin D 600-400 Mg-unit Tabs (Calcium carbonate-vitamin d) .... Take 1 tablet by mouth two times a day 8)  Spiriva Handihaler 18 Mcg Caps (Tiotropium bromide monohydrate) .Marland Kitchen.. 1 puff daily this was sent to [email or web address] 9)  Fosamax 70 Mg Tabs (Alendronate sodium) .... Take 1 by mouth weekly 10)  Diltiazem Hcl 90 Mg Tabs (Diltiazem hcl) .Marland Kitchen.. 1 by mouth two times a day 11)  Symbicort 160-4.5 Mcg/act Aero (Budesonide-formoterol fumarate) .... 2 inhalantions a m and  pm for copd 12)  Ventolin Hfa 108 (90 Base) Mcg/act Aers (Albuterol sulfate) .... 2 puffs three times a day as needed  Patient Instructions: 1)  To continue present treatment as well as using nasal oxygen at 2 L until she can be seen by the  pulmonologist

## 2010-05-17 NOTE — Assessment & Plan Note (Signed)
Summary: CPX/PT FASTING/CJR   Vital Signs:  Patient profile:   75 year old female Height:      60 inches Weight:      116 pounds BMI:     22.74 O2 Sat:      93 % on 4 L/min Temp:     98.1 degrees F Pulse rate:   76 / minute Pulse rhythm:   regular BP sitting:   130 / 72  (left arm) Cuff size:   regular  Vitals Entered By: Pura Spice, RN (September 15, 2009 10:56 AM)  O2 Flow:  4 L/min  Contraindications/Deferment of Procedures/Staging:    Test/Procedure: PAP Smear    Reason for deferment: patient declined     Test/Procedure: Colonoscopy    Reason for deferment: patient declined  CC: go over problems refill meds  refuses PAP stataed had hystrectomy 2008  Fasting  Is Patient Diabetic? No   History of Present Illness: This 75 year old white female with history of emphysema and being treated by Dr. Marcelyn Bruins His own courier and albuterol as needed but also on oxygen therapy during the day on exertion but not at night which is recommended to utilize it I she is to see Dr. Cora Daniels on June 3 for evaluation and any change in treatment Her other complaint is that of the entire ball of time but her oxygen level stays low and we will check her physically as well as laboratory one to explain this. She refuses a colonoscopic exam as well as a Pap smear Up-to-date on mammogram bone density she had a hysterectomy 2008 she smoked until my T. 90 and quit He has nocturnal leg cramps and such Klonopin sulfite is now available she drinks tonic water  Preventive Screening-Counseling & Management  Alcohol-Tobacco     Smoking Status: quit > 6 months     Year Quit: 1990  EKG  Procedure date:  09/15/2009  Findings:       sinus rhythm with rate of:   72 sinus Rhythm supraventricular extrasystoles first degee A-V block  Marked left axis deviation   Allergies: 1)  ! Pcn 2)  ! Neosporin  Past History:  Past Medical History: Last updated: 06/17/2009 Emphysema Hypertension Allergic  Rhinitis Hyperlipidemia eczema  Past Surgical History: Last updated: 01/28/2008 Appendectomy Hysterectomy Breast Biopsy B9 Vein stripping and ligation, no hx DVT Tonsils  Social History: Last updated: 06/17/2009 Occupation: retired Educational psychologist and food service Married. pt has 3 children. Former Smoker- 2 ppd x 40+ years.  quit 1990.   Alcohol use-yes Regular exercise-yes  Risk Factors: Smoking Status: quit > 6 months (09/15/2009) Packs/Day: 3.0 (09/09/2008)  Review of Systems      See HPI General:  See HPI; Complains of fatigue. Eyes:  Denies blurring, discharge, double vision, eye irritation, eye pain, halos, itching, light sensitivity, red eye, vision loss-1 eye, and vision loss-both eyes. ENT:  Denies decreased hearing, difficulty swallowing, ear discharge, earache, hoarseness, nasal congestion, nosebleeds, postnasal drainage, ringing in ears, sinus pressure, and sore throat. CV:  Denies bluish discoloration of lips or nails, chest pain or discomfort, difficulty breathing at night, difficulty breathing while lying down, fainting, fatigue, leg cramps with exertion, lightheadness, near fainting, palpitations, shortness of breath with exertion, swelling of feet, swelling of hands, and weight gain; electrocardiogram interpreted as sinus rhythm supraventricular extrasystoles first degree heart block left axis deviation. Resp:  See HPI; Complains of cough and shortness of breath; denies wheezing; shortness of breath on exertion but not  at rest. GI:  Denies abdominal pain, bloody stools, change in bowel habits, constipation, dark tarry stools, diarrhea, excessive appetite, gas, hemorrhoids, indigestion, loss of appetite, nausea, vomiting, vomiting blood, and yellowish skin color. GU:  Denies abnormal vaginal bleeding, decreased libido, discharge, dysuria, genital sores, hematuria, incontinence, nocturia, urinary frequency, and urinary hesitancy. MS:  Denies joint pain, joint redness,  joint swelling, loss of strength, low back pain, mid back pain, muscle aches, muscle , cramps, muscle weakness, stiffness, and thoracic pain.  Physical Exam  General:  Well-developed,well-nourished,in no acute distress; alert,appropriate and cooperative throughout examination Head:  Normocephalic and atraumatic without obvious abnormalities. No apparent alopecia or balding. Eyes:  No corneal or conjunctival inflammation noted. EOMI. Perrla. Funduscopic exam benign, without hemorrhages, exudates or papilledema. Vision grossly normal. Ears:  External ear exam shows no significant lesions or deformities.  Otoscopic examination reveals clear canals, tympanic membranes are intact bilaterally without bulging, retraction, inflammation or discharge. Hearing is grossly normal bilaterally. Nose:  External nasal examination shows no deformity or inflammation. Nasal mucosa are pink and moist without lesions or exudates. Mouth:  Oral mucosa and oropharynx without lesions or exudates.  Teeth in good repair. Neck:  No deformities, masses, or tenderness noted. Chest Wall:  No deformities, masses, or tenderness noted. Breasts:  No mass, nodules, thickening, tenderness, bulging, retraction, inflamation, nipple discharge or skin changes noted.   Lungs:  decreased breath sounds, no dullness no wheezing Heart:  Normal rate and regular rhythm. S1 and S2 normal without gallop, murmur, click, rub or other extra sounds. Abdomen:  Bowel sounds positive,abdomen soft and non-tender without masses, organomegaly or hernias noted. Rectal:  not examined Genitalia:  not examined Msk:  No deformity or scoliosis noted of thoracic or lumbar spine.   Pulses:  R and L carotid,radial,femoral,dorsalis pedis and posterior tibial pulses are full and equal bilaterally Extremities:  No clubbing, cyanosis, edema, or deformity noted with normal full range of motion of all joints.   Neurologic:  No cranial nerve deficits noted. Station and  gait are normal. Plantar reflexes are down-going bilaterally. DTRs are symmetrical throughout. Sensory, motor and coordinative functions appear intact. Skin:  Intact without suspicious lesions or rashes Cervical Nodes:  No lymphadenopathy noted Axillary Nodes:  No palpable lymphadenopathy Inguinal Nodes:  No significant adenopathy Psych:  Cognition and judgment appear intact. Alert and cooperative with normal attention span and concentration. No apparent delusions, illusions, hallucinations   Impression & Recommendations:  Problem # 1:  CHRONIC RESPIRATORY FAILURE (ZOX-096.04) Assessment Unchanged  Problem # 2:  EMPHYSEMA (ICD-492.8) Assessment: Unchanged nasal oxygen, Spiriva Symbicort" identified as directed by Dr. Shelle Iron  Problem # 3:  HYPERLIPIDEMIA (ICD-272.4) Assessment: Improved  Her updated medication list for this problem includes:    Lovastatin 20 Mg Tabs (Lovastatin) .Marland Kitchen... Take 1 tablet by mouth once a day at bedtime  Orders: Venipuncture (54098) TLB-Lipid Panel (80061-LIPID) TLB-Hepatic/Liver Function Pnl (80076-HEPATIC)  Problem # 4:  LEG CRAMPS, NOCTURNAL (ICD-729.82) Assessment: Unchanged tonic water one or 2 glasses a night to prevent cramps  Problem # 5:  HYPOTHYROIDISM (ICD-244.9) Assessment: Improved  Her updated medication list for this problem includes:    Levothyroxine Sodium 125 Mcg Tabs (Levothyroxine sodium) .Marland Kitchen... Take 1 tablet by mouth once a day    Levothroid 112 Mcg Tabs (Levothyroxine sodium) ..... One q.d.  Orders: TLB-TSH (Thyroid Stimulating Hormone) (84443-TSH)  Problem # 6:  ANXIETY DEPRESSION (ICD-300.4) Assessment: Improved citalopram 20 mg q.d.  Problem # 7:  COPD (ICD-496) Assessment: Unchanged  Her updated  medication list for this problem includes:    Spiriva Handihaler 18 Mcg Caps (Tiotropium bromide monohydrate) .Marland Kitchen... 1 puff daily this was sent to [email or web address]    Symbicort 160-4.5 Mcg/act Aero (Budesonide-formoterol  fumarate) .Marland Kitchen... 2 inhalantions a m and  pm for copd    Ventolin Hfa 108 (90 Base) Mcg/act Aers (Albuterol sulfate) .Marland Kitchen... 2 puffs three times a day as needed  Problem # 8:  HYPERTENSION (ICD-401.9) Assessment: Improved  Her updated medication list for this problem includes:    Diltiazem Hcl 90 Mg Tabs (Diltiazem hcl) .Marland Kitchen... Take 1 tablet by mouth once a day  Orders: EKG w/ Interpretation (93000)  Problem # 9:  OSTEOPENIA (ICD-733.90)  Her updated medication list for this problem includes:    Calcium Carbonate-vitamin D 600-400 Mg-unit Tabs (Calcium carbonate-vitamin d) .Marland Kitchen... Take 1 tablet by mouth two times a day    Fosamax 70 Mg Tabs (Alendronate sodium) .Marland Kitchen... Take 1 by mouth weekly  Orders: Prescription Created Electronically [phone] [id number])  Complete Medication List: 1)  Levothyroxine Sodium 125 Mcg Tabs (Levothyroxine sodium) .... Take 1 tablet by mouth once a day 2)  Lovastatin 20 Mg Tabs (Lovastatin) .... Take 1 tablet by mouth once a day at bedtime 3)  Oxygen 4l/m Inogen  .... Use cont except at bedtime then removes 4)  Citalopram Hydrobromide 20 Mg Tabs (Citalopram hydrobromide) .... Take 1 tablet by mouth once a day 5)  Temazepam 30 Mg Caps (Temazepam) .... Take 1 capsule by mouth once a day as needed sleep 6)  Bayer Aspirin 325 Mg Tabs (Aspirin) .... Take 1 tablet by mouth once a day 7)  Calcium Carbonate-vitamin D 600-400 Mg-unit Tabs (Calcium carbonate-vitamin d) .... Take 1 tablet by mouth two times a day 8)  Spiriva Handihaler 18 Mcg Caps (Tiotropium bromide monohydrate) .Marland Kitchen.. 1 puff daily this was sent to [email or web address] 9)  Fosamax 70 Mg Tabs (Alendronate sodium) .... Take 1 by mouth weekly 10)  Diltiazem Hcl 90 Mg Tabs (Diltiazem hcl) .... Take 1 tablet by mouth once a day 11)  Symbicort 160-4.5 Mcg/act Aero (Budesonide-formoterol fumarate) .... 2 inhalantions a m and  pm for copd 12)  Ventolin Hfa 108 (90 Base) Mcg/act Aers (Albuterol sulfate) .... 2 puffs three times a day as  needed 13)  Levothroid 112 Mcg Tabs (Levothyroxine sodium) .... One q.d.  Other Orders: T-Vitamin D (25-Hydroxy) [id number]) UA Dipstick w/o Micro (automated)  (81003) TLB-BMP (Basic Metabolic Panel-BMET) (80048-METABOL) TLB-CBC Platelet - w/Differential (85025-CBCD)  Patient Instructions: 1)  as we had reviewed your various medical problems a lot she has continued medications as prescribed levothyroxine dosage has been reduced due to to a low TSH 2)  Continue treatment under Dr. Shelle Iron 4 emphysema, COPD 3)  Return as needed 4)  Will call results of lab studies Prescriptions: LEVOTHROID 112 MCG TABS (LEVOTHYROXINE SODIUM) one q.d.  #30 x 11   Entered and Authorized by:   Judithann Sheen MD   Signed by:   Judithann Sheen MD on 09/27/2009   Method used:   Electronically to        [facility] [address] (retail)       [phone]       [place], [place]  [address]       Ph: [phone]       Fax: [phone]   RxID:   [id number] TEMAZEPAM 30 MG CAPS (TEMAZEPAM) Take 1 capsule by mouth once a day  as needed sleep  #30 x 5   Entered and Authorized by:   Judithann Sheen MD   Signed by:   Judithann Sheen MD on 09/15/2009   Method used:   Print then Give to Patient   RxID:   [id number] LEVOTHYROXINE SODIUM 125 MCG TABS (LEVOTHYROXINE SODIUM) Take 1 tablet by mouth once a day  #30 x 11   Entered and Authorized by:   Judithann Sheen MD   Signed by:   Judithann Sheen MD on 09/15/2009   Method used:   Electronically to        [facility] [address] [place] (retail)       [phone]       [place], [place]  [address]       Ph: [phone]       Fax: [phone]   RxID:   [id number] LOVASTATIN 20 MG TABS (LOVASTATIN) Take 1 tablet by mouth once a day at bedtime  #30 x 11   Entered and Authorized by:   Judithann Sheen MD   Signed by:   Judithann Sheen MD on 09/15/2009    Method used:   Electronically to        [facility] [address] [place] (retail)       [phone]       [place], [place]  [address]       Ph: [phone]       Fax: [phone]   RxID:   [id number] CITALOPRAM HYDROBROMIDE 20 MG TABS (CITALOPRAM HYDROBROMIDE) Take 1 tablet by mouth once a day  #30 x 11   Entered and Authorized by:   Judithann Sheen MD   Signed by:   Judithann Sheen MD on 09/15/2009   Method used:   Electronically to        [facility] [address] [place] (retail)       [phone]       [place], [place]  [address]       Ph: [phone]       Fax: [phone]   RxID:   [id number] FOSAMAX 70 MG TABS (ALENDRONATE SODIUM) take 1 by mouth weekly  #4 x 11   Entered and Authorized by:   Judithann Sheen MD   Signed by:   Judithann Sheen MD on 09/15/2009   Method used:   Electronically to        [facility] [address] [place] (retail)       [phone]       [place], [place]  [address]       Ph: [phone]       Fax: [phone]   RxID:   [id number]     Laboratory Results   Urine Tests    Routine Urinalysis   Color: yellow Appearance: Clear Glucose: negative   (Normal Range: Negative) Bilirubin: negative   (Normal Range: Negative) Ketone: negative   (Normal Range: Negative) Spec. Gravity: 1.015   (Normal Range: 1.003-1.035) Blood: negative   (Normal Range: Negative) pH: 6.5   (Normal Range: 5.0-8.0) Protein: negative   (Normal Range: Negative) Urobilinogen: 0.2   (Normal Range: 0-1) Nitrite: negative   (Normal Range: Negative) Leukocyte Esterace: negative   (Normal Range: Negative)  Comments: Rita Ohara  September 15, 2009 12:19 PM     Appended Document: CPX/PT FASTING/CJR reviewed EKG which was abnormal interpretation is sinus rhythm with supraventricular extrasystoles first degree AV block marked left axis deviation

## 2010-05-17 NOTE — Progress Notes (Signed)
Summary: lung transplant approved - FYI  --LMTCB x 1  Phone Note Call from Patient Call back at [facility] Phone [phone]   Caller: Patient Call For: clance Summary of Call: pt wants kc to know that she has been approved by [facility] for a lung transplant (she meets all the criteria).  Initial call taken by: Tivis Ringer, CNA,  November 10, 2009 3:32 PM  Follow-up for Phone Call        [facility] Vernie Murders  November 10, 2009 3:35 PM  Spoke with pt.  She states that she has been approved for lung trasplant and is going to [facility] for her eval on Sept 19th 2011.  Follow-up by: Vernie Murders,  November 10, 2009 3:42 PM  Additional Follow-up for Phone Call Additional follow up Details #1::        good for her!  let her know they will do extensive testing, and she will have to meet all of those criteria as well. Additional Follow-up by: Barbaraann Share MD,  November 10, 2009 5:46 PM    Additional Follow-up for Phone Call Additional follow up Details #2::    Left message for pt to call office back.  Gweneth Dimitri RN  November 11, 2009 8:47 AM  Pt aware of [facility]'s response. Vernie Murders  November 11, 2009 11:49 AM

## 2010-05-17 NOTE — Assessment & Plan Note (Signed)
Summary: Acute NP office visit - COPD, asthma   Copy to:  Rickard Patience Primary Provider/Referring Provider:  Judithann Sheen MD  CC:  increased SOB, wheezing, and prod cough with thick white mucus.  History of Present Illness: 75 yo female with known hx of she has known severe emphysema with chronic respiratory failure, and is oxygen dependent.   November 18, 2009--Presents for an acute office viist. Complains of increased SOB, wheezing, prod cough with thick white mucus. She has upcoming visit w/ [facility] for eval. for Lung transplant. She is worried that she will get worse. Mucus is white and sticky. Worse in evening. Denies chest pain, dyspnea, orthopnea, hemoptysis, fever, n/v/d, edema, headache. Using mucinex.   Medications Prior to Update: 1)  Levothyroxine Sodium 125 Mcg Tabs (Levothyroxine Sodium) .... Take 1 Tablet By Mouth Once A Day 2)  Lovastatin 20 Mg Tabs (Lovastatin) .... Take 1 Tablet By Mouth Once A Day At Bedtime 3)  Oxygen 4l/m  Inogen .... Use Cont Except At Bedtime Then Removes 4)  Citalopram Hydrobromide 20 Mg Tabs (Citalopram Hydrobromide) .... Take 1 Tablet By Mouth Once A Day 5)  Temazepam 30 Mg Caps (Temazepam) .... Take 1 Capsule By Mouth Once A Day As Needed Sleep 6)  Bayer Aspirin 325 Mg Tabs (Aspirin) .... Take 1 Tablet By Mouth Once A Day 7)  Calcium Carbonate-Vitamin D 600-400 Mg-Unit  Tabs (Calcium Carbonate-Vitamin D) .... Take 1 Tablet By Mouth Two Times A Day 8)  Spiriva Handihaler 18 Mcg Caps (Tiotropium Bromide Monohydrate) .Marland Kitchen.. 1 Puff Daily This Was Sent To [email or web address] 9)  Fosamax 70 Mg Tabs (Alendronate Sodium) .... Take 1 By Mouth Weekly 10)  Diltiazem Hcl 90 Mg Tabs (Diltiazem Hcl) .... Take 1 Tablet By Mouth Once A Day 11)  Symbicort 160-4.5 Mcg/act Aero (Budesonide-Formoterol Fumarate) .... 2 Inhalantions A M and  Pm For Copd 12)  Ventolin Hfa 108 (90 Base) Mcg/act Aers (Albuterol Sulfate) .... 2 Puffs Three Times A Day As Needed 13)   Levothroid 112 Mcg Tabs (Levothyroxine Sodium) .... One Q.d. 14)  Prednisone 10 Mg  Tabs (Prednisone) .... Take 4 Each Day For 2 Days, Then 3 Each Day For 2 Days, Then 2 Each Day For 2 Days, Then 1 Each Day For 2 Days, Then Stop  Current Medications (verified): 1)  Levothyroxine Sodium 125 Mcg Tabs (Levothyroxine Sodium) .... Take 1 Tablet By Mouth Once A Day 2)  Lovastatin 20 Mg Tabs (Lovastatin) .... Take 1 Tablet By Mouth Once A Day At Bedtime 3)  Oxygen 4l/m  Inogen .... Use Cont Except At Bedtime Then Removes 4)  Citalopram Hydrobromide 20 Mg Tabs (Citalopram Hydrobromide) .... Take 1 Tablet By Mouth Once A Day 5)  Temazepam 30 Mg Caps (Temazepam) .... Take 1 Capsule By Mouth Once A Day As Needed Sleep 6)  Bayer Aspirin 325 Mg Tabs (Aspirin) .... Take 1 Tablet By Mouth Once A Day 7)  Calcium Carbonate-Vitamin D 600-400 Mg-Unit  Tabs (Calcium Carbonate-Vitamin D) .... Take 1 Tablet By Mouth Two Times A Day 8)  Spiriva Handihaler 18 Mcg Caps (Tiotropium Bromide Monohydrate) .Marland Kitchen.. 1 Puff Daily This Was Sent To [email or web address] 9)  Fosamax 70 Mg Tabs (Alendronate Sodium) .... Take 1 By Mouth Weekly 10)  Diltiazem Hcl 90 Mg Tabs (Diltiazem Hcl) .... Take 1 Tablet By Mouth Once A Day 11)  Symbicort 160-4.5 Mcg/act Aero (Budesonide-Formoterol Fumarate) .... 2 Inhalantions A M and  Pm For Copd 12)  Ventolin Hfa 108 (90  Base) Mcg/act Aers (Albuterol Sulfate) .... 2 Puffs Three Times A Day As Needed  Allergies (verified): 1)  ! Pcn 2)  ! Neosporin  Past History:  Past Medical History: Last updated: 06/17/2009 Emphysema Hypertension Allergic Rhinitis Hyperlipidemia eczema  Past Surgical History: Last updated: 01/28/2008 Appendectomy Hysterectomy Breast Biopsy B9 Vein stripping and ligation, no hx DVT Tonsils  Family History: Last updated: 06/17/2009 Family History Hypertension Family History of Stroke M 1st degree relative <50 2 children w/ asthma Twin sister- no smoking, no lung  disease. 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 Former Smoker- 2 ppd x 40+ years.  quit 1990.   Alcohol use-yes Regular exercise-yes  Risk Factors: Exercise: yes (12/24/2006)  Risk Factors: Smoking Status: quit > 6 months (09/15/2009) Packs/Day: 3.0 (09/09/2008)  Review of Systems      See HPI  Vital Signs:  Patient profile:   75 year old female Height:      60 inches Weight:      122 pounds BMI:     23.91 O2 Sat:      90 % on 4 L/min cont Temp:     99.3 degrees F oral Pulse rate:   82 / minute BP sitting:   142 / 76  (right arm) Cuff size:   regular  Vitals Entered By: Boone Master CNA/MA (November 18, 2009 3:18 PM)  O2 Flow:  4 L/min cont CC: increased SOB, wheezing, prod cough with thick white mucus Is Patient Diabetic? No Comments Medications reviewed with patient Daytime contact number verified with patient. Boone Master CNA/MA  November 18, 2009 3:20 PM    Physical Exam  Additional Exam:  GEN: A/Ox3; pleasant , NAD HEENT:  [place]/AT, , EACs-clear, TMs-wnl, NOSE-clear, THROAT-clear NECK:  Supple w/ fair ROM; no JVD; normal carotid impulses w/o bruits; no thyromegaly or nodules palpated; no lymphadenopathy. RESP  Coarse BS w/ no wheezing  CARD:  RRR, no m/r/g   GI:   Soft & nt; nml bowel sounds; no organomegaly or masses detected. [place]: Warm bil,  no calf tenderness edema, clubbing, pulses intact     Impression & Recommendations:  Problem # 1:  COPD (ICD-496)  Exacerbation  REC:  Doxycycline 100mg  two times a day for 7 days.  Mucinex DM two times a day as needed cough/congestion Please contact office for sooner follow up if symptoms do not improve or worsen   Orders: Est. Patient Level III ([address])  Medications Added to Medication List This Visit: 1)  Doxycycline Hyclate 100 Mg Caps (Doxycycline hyclate) .Marland Kitchen..  1 by mouth two times a day  Complete Medication List: 1)  Levothyroxine Sodium 125 Mcg Tabs (Levothyroxine sodium) .... Take 1 tablet by mouth once a day 2)  Lovastatin 20 Mg Tabs (Lovastatin) .... Take 1 tablet by mouth once a day at bedtime 3)  Oxygen 4l/m Inogen  .... Use cont except at bedtime then removes 4)  Citalopram Hydrobromide 20 Mg Tabs (Citalopram hydrobromide) .... Take 1 tablet by mouth once a day 5)  Temazepam 30 Mg Caps (Temazepam) .... Take 1 capsule by mouth once a day as needed sleep 6)  Bayer Aspirin 325 Mg Tabs (Aspirin) .... Take 1 tablet by mouth once a day 7)  Calcium Carbonate-vitamin D 600-400 Mg-unit Tabs (Calcium carbonate-vitamin d) .... Take 1 tablet by mouth two times a day 8)  Spiriva Handihaler 18 Mcg Caps (  Tiotropium bromide monohydrate) .Marland Kitchen.. 1 puff daily this was sent to [email or web address] 9)  Fosamax 70 Mg Tabs (Alendronate sodium) .... Take 1 by mouth weekly 10)  Diltiazem Hcl 90 Mg Tabs (Diltiazem hcl) .... Take 1 tablet by mouth once a day 11)  Symbicort 160-4.5 Mcg/act Aero (Budesonide-formoterol fumarate) .... 2 inhalantions a m and  pm for copd 12)  Ventolin Hfa 108 (90 Base) Mcg/act Aers (Albuterol sulfate) .... 2 puffs three times a day as needed 13)  Doxycycline Hyclate 100 Mg Caps (Doxycycline hyclate) .Marland Kitchen.. 1 by mouth two times a day  Patient Instructions: 1)  Doxycycline 100mg  two times a day for 7 days.  2)  Mucinex DM two times a day as needed cough/congestion 3)  Please contact office for sooner follow up if symptoms do not improve or worsen  Prescriptions: DOXYCYCLINE HYCLATE 100 MG CAPS (DOXYCYCLINE HYCLATE) 1 by mouth two times a day  #14 x 0   Entered and Authorized by:   Rubye Oaks NP   Signed by:   Rubye Oaks NP on 11/18/2009   Method used:   Electronically to        [facility] [address] [place] (retail)       [phone]       [place], [place]  [address]       Ph: [phone]       Fax:  [phone]   RxID:   [id number]

## 2010-05-17 NOTE — Assessment & Plan Note (Signed)
Summary: HEP B INJ // RS  Nurse Visit   Allergies: 1)  ! Pcn 2)  ! Neosporin  Immunizations Administered:  Hepatitis B Vaccine # 2:    Vaccine Type: HepB Adolescent    Site: left deltoid    Mfr: Merck    Dose: 1.0 ml    Route: IM    Given by: Josph Macho RMA    Exp. Date: 02/06/2012    Lot #: [id number]    VIS given: 11/01/05 version given December 24, 2009.  Orders Added: 1)  Hepatitis B Vaccine ADOLESCENT (2 dose) [90743] 2)  Admin 1st Vaccine [[address]109]

## 2010-05-17 NOTE — Progress Notes (Signed)
Summary: records  Phone Note Call from Patient Call back at Home Phone [phone]   Caller: Patient Call For: clance Reason for Call: Talk to Nurse Summary of Call: have all patients records been sent to [facility] for her appt?  If not, please let her know and she'll come by and pick them up. Initial call taken by: Eugene Gavia,  December 09, 2009 1:29 PM  Follow-up for Phone Call        this is a message that needs to be addressed through medical records.  will forward to medical records.  Aundra Millet Reynolds LPN  December 09, 2009 1:33 PM   Additional Follow-up for Phone Call Additional follow up Details #1::        spoke with the patient. Additional Follow-up by: Wilder Glade,  December 09, 2009 4:57 PM

## 2010-05-17 NOTE — Progress Notes (Signed)
Summary: WT GAIN  Phone Note Call from Patient Call back at Home Phone [phone]   Caller: Patient Call For: Judithann Sheen MD Summary of Call: PT WIOULD  GINA TO CALL HER BACK CONCERN  WT GAIN Initial call taken by: Heron Sabins,  November 25, 2009 11:59 AM  Follow-up for Phone Call        pt stated been on prednisone and doxycycline given to her by dr clance for infection and c/o 9 pound weight gain. pls advise .   Follow-up by: Pura Spice, RN,  November 25, 2009 5:20 PM  Additional Follow-up for Phone Call Additional follow up Details #1::        per dr Alfonzo Feller call in generic maxzide  Additional Follow-up by: Pura Spice, RN,  November 25, 2009 5:21 PM    New/Updated Medications: TRIAMTERENE-HCTZ 37.5-25 MG TABS (TRIAMTERENE-HCTZ) 1 by mouth once daily as needed swelling. Prescriptions: TRIAMTERENE-HCTZ 37.5-25 MG TABS (TRIAMTERENE-HCTZ) 1 by mouth once daily as needed swelling.  #30 x 0   Entered by:   Pura Spice, RN   Authorized by:   Judithann Sheen MD   Signed by:   Pura Spice, RN on 11/25/2009   Method used:   Electronically to        [facility] [address] (retail)       [phone]       [place], [place]  [address]       Ph: [phone]       Fax: [phone]   RxID:   [id number]

## 2010-05-17 NOTE — Progress Notes (Signed)
Summary: referral questions  Phone Note Call from Patient   Caller: Patient Call For: Judithann Sheen MD Summary of Call: Pt has questions about GI referral. [phone] Initial call taken by: [facility] CMA,  December 13, 2009 11:42 AM  Follow-up for Phone Call        pt informed of MDS name Follow-up by: Josph Macho RMA,  December 13, 2009 12:07 PM

## 2010-05-17 NOTE — Assessment & Plan Note (Signed)
Summary: acute sick visit for worsening sob.   Copy to:  Rickard Patience Primary Provider/Referring Provider:  Judithann Sheen MD  CC:  Pt reports that O2 is up to 6L now at rehab - SOB worse in past week.  History of Present Illness: The pt comes in today for an acute sick visit.  She has known severe emphysema with chronic respiratory failure, and is oxygen dependent.  Despite this, she leads a fairly active lifestyle, and participates in regular exercise.  She comes in today where she has noted worsening doe the past one week, and is requiring higher oxygen flow at rehab to maintain her sats.  She denies cough, congestion, pleuritic chest pain or pressure.  She does not feel that she is taking on fluid, although her weight is up about 2 pounds since last visit.  Her sats today are 84% on 4lpm pulsed walking to the exam room, but does come up to 92% after resting a few minutes.  She has been staying on her pulmonary meds consistently.  Current Medications (verified): 1)  Levothyroxine Sodium 125 Mcg Tabs (Levothyroxine Sodium) .... Take 1 Tablet By Mouth Once A Day 2)  Lovastatin 20 Mg Tabs (Lovastatin) .... Take 1 Tablet By Mouth Once A Day At Bedtime 3)  Oxygen 4l/m  Inogen .... Use Cont Except At Bedtime Then Removes 4)  Citalopram Hydrobromide 20 Mg Tabs (Citalopram Hydrobromide) .... Take 1 Tablet By Mouth Once A Day 5)  Temazepam 30 Mg Caps (Temazepam) .... Take 1 Capsule By Mouth Once A Day As Needed Sleep 6)  Bayer Aspirin 325 Mg Tabs (Aspirin) .... Take 1 Tablet By Mouth Once A Day 7)  Calcium Carbonate-Vitamin D 600-400 Mg-Unit  Tabs (Calcium Carbonate-Vitamin D) .... Take 1 Tablet By Mouth Two Times A Day 8)  Spiriva Handihaler 18 Mcg Caps (Tiotropium Bromide Monohydrate) .Marland Kitchen.. 1 Puff Daily This Was Sent To [email or web address] 9)  Fosamax 70 Mg Tabs (Alendronate Sodium) .... Take 1 By Mouth Weekly 10)  Diltiazem Hcl 90 Mg Tabs (Diltiazem Hcl) .... Take 1 Tablet By Mouth Once A  Day 11)  Symbicort 160-4.5 Mcg/act Aero (Budesonide-Formoterol Fumarate) .... 2 Inhalantions A M and  Pm For Copd 12)  Ventolin Hfa 108 (90 Base) Mcg/act Aers (Albuterol Sulfate) .... 2 Puffs Three Times A Day As Needed 13)  Levothroid 112 Mcg Tabs (Levothyroxine Sodium) .... One Q.d.  Allergies (verified): 1)  ! Pcn 2)  ! Neosporin  Past History:  Past medical, surgical, family and social histories (including risk factors) reviewed, and no changes noted (except as noted below).  Past Medical History: Reviewed history from 06/17/2009 and no changes required. Emphysema Hypertension Allergic Rhinitis Hyperlipidemia eczema  Past Surgical History: Reviewed history from 01/28/2008 and no changes required. Appendectomy Hysterectomy Breast Biopsy B9 Vein stripping and ligation, no hx DVT Tonsils  Family History: Reviewed history from 06/17/2009 and no changes required. Family History Hypertension Family History of Stroke M 1st degree relative <50 2 children w/ asthma Twin sister- no smoking, no lung disease. heart disease: mother, father cancer: sister (pancreatic)   Social History: Reviewed history from 06/17/2009 and no changes required. 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 Former Smoker- 2 ppd x 40+ years.  quit 1990.   Alcohol use-yes Regular exercise-yes  Review of Systems       The patient complains of shortness of breath with activity and shortness of breath at rest.  The patient denies productive cough,  non-productive cough, coughing up blood, chest pain, irregular heartbeats, acid heartburn, indigestion, loss of appetite, weight change, abdominal pain, difficulty swallowing, sore throat, tooth/dental problems, headaches, nasal congestion/difficulty breathing through nose, sneezing, itching, ear ache, anxiety, depression, hand/feet swelling, joint stiffness or pain, rash, change in color of mucus, and fever.    Vital  Signs:  Patient profile:   75 year old female Height:      60 inches Weight:      120 pounds BMI:     23.52 O2 Sat:      84 % on 4 L/min Temp:     97.9 degrees F oral Pulse rate:   94 / minute BP sitting:   142 / 62  (left arm) Cuff size:   regular  Vitals Entered By: Abigail Miyamoto RN (October 21, 2009 11:15 AM)  O2 Flow:  4 L/min  O2 Sat Comments 92% on 4L after resting  Physical Exam  General:  thin female in nad Nose:  no purulence or drainage noted. Lungs:  decreased bs throughout with no wheezing or rhonchi Heart:  distant, rrr Extremities:  no edema or cyanosis Neurologic:  alert and oriented, moves all 4.   Impression & Recommendations:  Problem # 1:  DYSPNEA (ICD-786.05) the pt has worsening sob and a little bit lower sats than baseline of unknown origin.  She has no acute bronchospasm on exam, or other historical info that is suggestive of a copd exacerbation.  She has nothing to suggest acute PE or a pulmonary infection.  She has not had a recent cardiac evaluation, and will at least start with an echo.  Despite all of this, her worsening symptoms are more than likely due to her lung disease.  Will treat emperically with a course of steroids, and see if we can get her on continuous rather than pulsed oxygen.  Will also check a cxr today to r/o acute process.  Problem # 2:  EMPHYSEMA (ICD-492.8) with chronic respiratory failure.  The pt is interested in being evaluated at [facility] for possible lung transplant.  I have spoken with the team there, and are willing to evaluate her.  The paperwork has been faxed to [place] and will return.  Medications Added to Medication List This Visit: 1)  Prednisone 10 Mg Tabs (Prednisone) .... Take 4 each day for 2 days, then 3 each day for 2 days, then 2 each day for 2 days, then 1 each day for 2 days, then stop  Other Orders: Est. Patient Level IV (62130) Echo Referral (Echo) T-2 View CXR (71020TC)  Patient Instructions: 1)  will treat  with a course of prednisone. 2)  will check cxr today, and schedule for echo. 3)  please call dme and see if your portable concentrator can be switched to continuous mode with exertions.  If it cannot, we may have to consider different portable source. 4)  please call if you are not improving   Prescriptions: PREDNISONE 10 MG  TABS (PREDNISONE) take 4 each day for 2 days, then 3 each day for 2 days, then 2 each day for 2 days, then 1 each day for 2 days, then stop  #20 x 0   Entered and Authorized by:   Barbaraann Share MD   Signed by:   Barbaraann Share MD on 10/21/2009   Method used:   Print then Give to Patient   RxID:   [id number]

## 2010-05-17 NOTE — Progress Notes (Signed)
Summary: PPD results  Phone Note From Other Clinic   Caller: kim w/ [facility] Call For: clance Summary of Call: requests PPD results. fax to [phone]. contact # is [phone] Initial call taken by: Tivis Ringer, CNA,  January 14, 2010 5:01 PM  Follow-up for Phone Call        I looked through all OV notes from [facility] and looks like we never placed PPD.  I spoke with Raynelle Fanning at [facility] and made aware of this. Follow-up by: Vernie Murders,  January 14, 2010 5:12 PM

## 2010-05-17 NOTE — Progress Notes (Signed)
Summary: REQ FOR MED REFILL  Phone Note Call from Patient   Caller: Patient [phone] Reason for Call: Refill Medication Summary of Call: Pt called to adv that she called in on Monday, 05/03/2009, and left msg for Almira Coaster, RN (Dr. Charmian Muff nurse)??--- No phone note submitted?Marland Kitchen... Pt adv that [facility] Bergeron is advising her that no has sent in a refill RX for med (Diltiazem Hcl 90 Mg Tabs) 90 day supply..... Pt req that same be sent in to [facility] [address] [address].   Initial call taken by: Debbra Riding,  May 05, 2009 2:39 PM  Follow-up for Phone Call        notified pt and left mess on her VM that i did not receive any message from her for her refill on diltiazem but have escripted it over for her and infromed her next CPX due May 2011 and to call back if questions . Follow-up by: Pura Spice, RN,  May 05, 2009 2:51 PM    Prescriptions: DILTIAZEM HCL 90 MG TABS (DILTIAZEM HCL) 1 by mouth two times a day  #180 x 0   Entered by:   Pura Spice, RN   Authorized by:   Judithann Sheen MD   Signed by:   Pura Spice, RN on 05/05/2009   Method used:   Faxed to ...       [facility] [address] (retail)       [phone]       [place], [place]  [address]       Ph: [phone]       Fax: [phone]   RxID:   [id number]

## 2010-05-17 NOTE — Letter (Signed)
Summary: Lung Transplant/[facility]  Lung Transplant/[facility]   Imported By: Lester Conejos 11/17/2009 09:16:57  _____________________________________________________________________  External Attachment:    Type:   Image     Comment:   External Document

## 2010-05-17 NOTE — Progress Notes (Signed)
Summary: pulm rehab  Phone Note From Other Clinic   Caller: jenni w/ [facility] Call For: Nancy Blair Summary of Call: requests to talk to nurse re: pt (caller is aware that kc is oot of office this afternoon). [phone] Initial call taken by: Tivis Ringer, CNA,  September 21, 2009 4:08 PM  Follow-up for Phone Call        Boneta Lucks calling from pulm rehab states that a fax is coming about pt progress in rehab. Sh estaets she has not been getting good sats onpt. She has been in the mid 80's on 4 liters nasal oximizer, so they have incresed her to 6 liters nasal oximizer and  she went 250 feet before desaturating. Boneta Lucks wants to know if it is ok to have pt on 6 liters for rehab and if they are not able to progress her in the program on 6 liters would he ok a venti mask fro rehab? Again Boneta Lucks is faxing over orders for this to be signed as well. Carron Curie CMA  September 21, 2009 4:16 PM   Additional Follow-up for Phone Call Additional follow up Details #1::        that plan is ok with me. Additional Follow-up by: Barbaraann Share MD,  September 22, 2009 3:50 PM    Additional Follow-up for Phone Call Additional follow up Details #2::    Aundra Millet Boneta Lucks will be faxing an order form for [facility] to sign in regards to this phone note, just wanted to make you aware that per Boneta Lucks they have to have this signed cannot take verbal. Thanks. Carron Curie CMA  September 22, 2009 4:13 PM   paperwork received and put in [facility] very important look at folder for him to review and sign.  Arman Filter LPN  September 24, 6211 8:53 AM   Additional Follow-up for Phone Call Additional follow up Details #3:: Details for Additional Follow-up Action Taken: done Additional Follow-up by: Barbaraann Share MD,  September 23, 2009 5:04 PM

## 2010-05-17 NOTE — Progress Notes (Signed)
Summary: needs refill today  Phone Note Call from Patient Call back at Home Phone [phone]   Caller: Patient Call For: Judithann Sheen MD Summary of Call: pt needs refill on spiriva call into [facility] [address] [phone] today, pt is out Initial call taken by: Heron Sabins,  July 30, 2009 12:37 PM    Prescriptions: SPIRIVA HANDIHALER 18 MCG CAPS (TIOTROPIUM BROMIDE MONOHYDRATE) 1 puff daily this was sent to [email or web address]  #1 x 3   Entered by:   Lynann Beaver CMA   Authorized by:   Judithann Sheen MD   Signed by:   Lynann Beaver CMA on 07/30/2009   Method used:   Electronically to        [facility] [address] (retail)       [phone]       [place], [place]  [address]       Ph: [phone]       Fax: [phone]   RxID:   [id number]

## 2010-05-17 NOTE — Progress Notes (Signed)
Summary: REFILL REQUEST  Phone Note Refill Request Message from:  Patient on December 24, 2009 2:47 PM  Refills Requested: Medication #1:  TEMAZEPAM 30 MG CAPS Take 1 capsule by mouth once a day as needed sleep   Notes: Psychologist, forensic - [address]  Medication #2:  DILTIAZEM HCL 90 MG TABS Take 1 tablet by mouth once a day   Notes: Psychologist, forensic - [address]    Initial call taken by: Debbra Riding,  December 24, 2009 2:48 PM  Follow-up for Phone Call        Please advise Temazepam refill? I already sent the refill for Diltiazem Follow-up by: Josph Macho RMA,  December 24, 2009 3:15 PM  Additional Follow-up for Phone Call Additional follow up Details #1::        so it looks like Dr Scotty Court sent 6 months worth in June and [place] sent 2 months worth last month? So even if she lost Dr Charmian Muff rx she should still have a refill Additional Follow-up by: Danise Edge MD,  December 24, 2009 3:29 PM    Additional Follow-up for Phone Call Additional follow up Details #2::    Pharmacy states there are no refills on the Temazepam? Follow-up by: Josph Macho RMA,  December 24, 2009 4:34 PM  Prescriptions: TEMAZEPAM 30 MG CAPS (TEMAZEPAM) Take 1 capsule by mouth once a day as needed sleep  #30 x 0   Entered by:   Josph Macho RMA   Authorized by:   Danise Edge MD   Signed by:   Josph Macho RMA on 12/24/2009   Method used:   Telephoned to ...       [facility] [address] (retail)       [phone]       [place], [place]  [address]       Ph: [phone]       Fax: [phone]   RxID:   [id number] DILTIAZEM HCL 90 MG TABS (DILTIAZEM HCL) Take 1 tablet by mouth once a day  #30 x 1   Entered by:   Josph Macho RMA   Authorized by:   Danise Edge MD   Signed by:   Josph Macho RMA on 12/24/2009   Method used:   Electronically to        [facility] [address] (retail)       [phone]       [place], [place]  [address]       Ph: [phone]       Fax: [phone]   RxID:   [id number]

## 2010-05-17 NOTE — Assessment & Plan Note (Signed)
Summary: consult for emphysema, chronic respiratory failure.   Copy to:  Rickard Patience Primary Provider/Referring Provider:  Judithann Sheen MD  CC:  Pulmonary Consult.  History of Present Illness: The pt is a 75y/o female with known severe emphysema, who presents today to re-establish with pulmonologist.  She has not been seen in this office since 2005.  She  had pfts in 2004 which showed FEV1 of 1.34, and ratio of 50.  Her DLCO was 41%.  She has been on HS oxygen since last year, and she tells me her last cxr in Dec of 2010 was clear.  The pt states that her doe was at her usual baseline until about one year ago when she began to notice worsening.  She currently has less than one block DOE on flat ground at a moderate pace.  She gets winded bringing groceries in from the car.  She denies chronic cough, and has had no LE edema.  She currently is being maintained on spiriva and symbicort inhalers.  Medications Prior to Update: 1)  Levothyroxine Sodium 125 Mcg Tabs (Levothyroxine Sodium) .... Take 1 Tablet By Mouth Once A Day 2)  Lovastatin 20 Mg Tabs (Lovastatin) .... Take 1 Tablet By Mouth Once A Day At Bedtime 3)  Oxygen 2 L/m Advanced .... At Bedtime As Needed 4)  Citalopram Hydrobromide 20 Mg Tabs (Citalopram Hydrobromide) .... Take 1 Tablet By Mouth Once A Day 5)  Temazepam 30 Mg Caps (Temazepam) .... Take 1 Capsule By Mouth Once A Day As Needed Sleep 6)  Bayer Aspirin 325 Mg Tabs (Aspirin) .... Take 1 Tablet By Mouth Once A Day 7)  Calcium Carbonate-Vitamin D 600-400 Mg-Unit  Tabs (Calcium Carbonate-Vitamin D) .... Take 1 Tablet By Mouth Two Times A Day 8)  Spiriva Handihaler 18 Mcg Caps (Tiotropium Bromide Monohydrate) .Marland Kitchen.. 1 Puff Daily This Was Sent To [email or web address] 9)  Fosamax 70 Mg Tabs (Alendronate Sodium) .... Take 1 By Mouth Weekly 10)  Diltiazem Hcl 90 Mg Tabs (Diltiazem Hcl) .Marland Kitchen.. 1 By Mouth Two Times A Day 11)  Symbicort 160-4.5 Mcg/act Aero (Budesonide-Formoterol  Fumarate) .... 2 Inhalantions A M and  Pm For Copd 12)  Ventolin Hfa 108 (90 Base) Mcg/act Aers (Albuterol Sulfate) .... 2 Puffs Three Times A Day As Needed  Allergies (verified): 1)  ! Pcn 2)  ! Neosporin  Past History:  Past Medical History: Emphysema Hypertension Allergic Rhinitis Hyperlipidemia eczema  Past Surgical History: Reviewed history from 01/28/2008 and no changes required. Appendectomy Hysterectomy Breast Biopsy B9 Vein stripping and ligation, no hx DVT Tonsils  Family History: Reviewed history from 01/28/2008 and no changes required. Family History Hypertension Family History of Stroke M 1st degree relative <50 2 children w/ asthma Twin sister- no smoking, no lung disease. heart disease: mother, father cancer: sister (pancreatic)   Social History: Reviewed history from 09/09/2008 and no changes required. 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 Former Smoker- 2 ppd x 40+ years.  quit 1990.   Alcohol use-yes Regular exercise-yes  Review of Systems       The patient complains of shortness of breath with activity and acid heartburn.  The patient denies shortness of breath at rest, productive cough, non-productive cough, coughing up blood, chest pain, irregular heartbeats, indigestion, loss of appetite, weight change, abdominal pain, difficulty swallowing, sore throat, tooth/dental problems, headaches, nasal congestion/difficulty breathing through nose, sneezing, itching, ear ache, anxiety, depression, hand/feet swelling, joint stiffness or pain, rash, change in  color of mucus, and fever.    Vital Signs:  Patient profile:   75 year old female Height:      60 inches Weight:      117.50 pounds BMI:     23.03 O2 Sat:      81 % on Room air Temp:     97.5 degrees F oral Pulse rate:   65 / minute BP sitting:   130 / 72  (left arm) Cuff size:   regular  Vitals Entered By: Arman Filter LPN (June 18, 1306 11:29  AM)  O2 Flow:  Room air CC: Pulmonary Consult Comments Started pt on 2 LPM cont.  o2 sats increased to 88% on 2 LPM cont.  increased o2 to 3 LPM cont.  o2 sats increased to 92 % on 3 LPM cont.   Medications reviewed with patient Arman Filter LPN  June 17, 6576 11:29 AM    Physical Exam  General:  wd female in nad Eyes:  PERRLA and EOMI.   Nose:  narrowed but patent Mouth:  clear Neck:  no jvd, tmg, LN Lungs:  very decreased bs throughout, no wheezing Heart:  rrr, no mrg Abdomen:  soft and nontender, bs+ Extremities:  no edema, no cyanosis pulses intact distally Neurologic:  alert and oriented, moves all 4.   Impression & Recommendations:  Problem # 1:  EMPHYSEMA (ICD-492.8) the pt has a history of severe emphysema by her pfts in 2004, and currently is having worsening doe.  She does not have bronchospasm by exam, and I suspect the issue is exertional hypoxemia.  She will need to wear oxygen 24hrs a day.  She is on a good bronchodilator regimen, and the only other thing to consider is a pulmonary rehab program.  She is willing to try.  If she goes to rehab, wears oxygen compliantly, and continues to have signficant doe, may want to consider a cardiac w/u if this hasn't been done.  Problem # 2:  CHRONIC RESPIRATORY FAILURE (ICD-518.83) the pt has become oxgyen dependent at this point, and will need to wear 24hrs/day.  Other Orders: Est. Patient Level V (46962) DME Referral (DME) Rehabilitation Referral (Rehab) Consultation Level IV (95284)  Patient Instructions: 1)  stay on current inhaler regimen. 2)  will need to stay on oxygen 24hrs a day at 2 liters 3)  will refer to pulmonary rehab program at [facility]. 4)  followup with me in 3mos or sooner if oxygen is not helping a lot.

## 2010-05-17 NOTE — Assessment & Plan Note (Signed)
Summary: rov for severe emphysema   Copy to:  Rickard Patience Primary Provider/Referring Provider:  Judithann Sheen MD  CC:  Pt is here for a 3 month f/u appt.  Pt states breathing is unchanged from last visit-still sob with exertion.  pt denied cough, chest pain, and edema.  .  History of Present Illness: the pt comes in today for f/u of her known chronic respiratory failure secondary to severe emphysema.  She is maintaining her usual baseline with respect to exercise tolerance, and denies any recent chest infection.  She is staying on her bronchodilator regimen, and reports no increased congestion, cough, or purulence.  She has not had any worsening LE edema.  She is not as compliant as I wish her to be wrt oxygen use.  Vital Signs:  Patient profile:   75 year old female Height:      60 inches Weight:      118 pounds BMI:     23.13 O2 Sat:      90 % on 3 L/minpulse Temp:     98.0 degrees F oral Pulse rate:   75 / minute BP sitting:   124 / 62  (left arm) Cuff size:   regular  Vitals Entered By: Arman Filter LPN (September 17, 1608 1:39 PM)  O2 Flow:  3 L/minpulse   Current Medications (verified): 1)  Levothyroxine Sodium 125 Mcg Tabs (Levothyroxine Sodium) .... Take 1 Tablet By Mouth Once A Day 2)  Lovastatin 20 Mg Tabs (Lovastatin) .... Take 1 Tablet By Mouth Once A Day At Bedtime 3)  Oxygen 4l/m  Inogen .... Use Cont Except At Bedtime Then Removes 4)  Citalopram Hydrobromide 20 Mg Tabs (Citalopram Hydrobromide) .... Take 1 Tablet By Mouth Once A Day 5)  Temazepam 30 Mg Caps (Temazepam) .... Take 1 Capsule By Mouth Once A Day As Needed Sleep 6)  Bayer Aspirin 325 Mg Tabs (Aspirin) .... Take 1 Tablet By Mouth Once A Day 7)  Calcium Carbonate-Vitamin D 600-400 Mg-Unit  Tabs (Calcium Carbonate-Vitamin D) .... Take 1 Tablet By Mouth Two Times A Day 8)  Spiriva Handihaler 18 Mcg Caps (Tiotropium Bromide Monohydrate) .Marland Kitchen.. 1 Puff Daily This Was Sent To [email or web address] 9)  Fosamax  70 Mg Tabs (Alendronate Sodium) .... Take 1 By Mouth Weekly 10)  Diltiazem Hcl 90 Mg Tabs (Diltiazem Hcl) .... Take 1 Tablet By Mouth Once A Day 11)  Symbicort 160-4.5 Mcg/act Aero (Budesonide-Formoterol Fumarate) .... 2 Inhalantions A M and  Pm For Copd 12)  Ventolin Hfa 108 (90 Base) Mcg/act Aers (Albuterol Sulfate) .... 2 Puffs Three Times A Day As Needed  Allergies (verified): 1)  ! Pcn 2)  ! Neosporin  Review of Systems       The patient complains of shortness of breath with activity.  The patient denies shortness of breath at rest, productive cough, non-productive cough, coughing up blood, chest pain, irregular heartbeats, acid heartburn, indigestion, loss of appetite, weight change, abdominal pain, difficulty swallowing, sore throat, tooth/dental problems, headaches, nasal congestion/difficulty breathing through nose, sneezing, itching, ear ache, anxiety, depression, hand/feet swelling, joint stiffness or pain, rash, change in color of mucus, and fever.    Physical Exam  General:  wd female in nad Nose:  no drainage noted Lungs:  decreased bs, no wheezing or rhonchi Heart:  rrr, no mrg Extremities:  no edema or cyanosis Neurologic:  alert and oriented, moves all 4.   Impression & Recommendations:  Problem # 1:  CHRONIC RESPIRATORY  FAILURE (ICD-518.83) the pt is wearing oxygen, but not as compliantly as she should.  She is trying to balance mobility, convenience, and compliance all at the same time, and sometimes results in inadequate sats.  I have asked her to work on this.  Problem # 2:  EMPHYSEMA (ICD-492.8) the pt is on an adequate bronchodilator regimen, and there is really nothing further to do medically.  She is participating in pulmonary rehab, and I have asked her to continue.  Medications Added to Medication List This Visit: 1)  Diltiazem Hcl 90 Mg Tabs (Diltiazem hcl) .... Take 1 tablet by mouth once a day  Other Orders: Est. Patient Level III ([address]) Prescription  Created Electronically [phone])  Patient Instructions: 1)  no change in meds 2)  stay in pulmonary rehab. 3)  if your sats fall on pulsed oxygen, take a rest or change to continuous flow. 4)  wear oxygen 24/hrs a day unless sitting at home comfortably with adequate sats.  You MUST wear while sleeping 5)  followup with me in 4mos.  Prescriptions: VENTOLIN HFA 108 (90 BASE) MCG/ACT AERS (ALBUTEROL SULFATE) 2 puffs three times a day as needed  #1 x 6   Entered and Authorized by:   Barbaraann Share MD   Signed by:   Barbaraann Share MD on 09/17/2009   Method used:   Electronically to        [facility] [address] (retail)       [phone]       [place], [place]  [address]       Ph: [phone]       Fax: [phone]   RxID:   [id number] SYMBICORT 160-4.5 MCG/ACT AERO (BUDESONIDE-FORMOTEROL FUMARATE) 2 inhalantions A M and  PM for COPD  #1 x 6   Entered and Authorized by:   Barbaraann Share MD   Signed by:   Barbaraann Share MD on 09/17/2009   Method used:   Electronically to        [facility] [address] (retail)       [phone]       [place], [place]  [address]       Ph: [phone]       Fax: [phone]   RxID:   [id number] SPIRIVA HANDIHALER 18 MCG CAPS (TIOTROPIUM BROMIDE MONOHYDRATE) 1 puff daily this was sent to [email or web address]  #30 x 3   Entered and Authorized by:   Barbaraann Share MD   Signed by:   Barbaraann Share MD on 09/17/2009   Method used:   Electronically to        [facility] [address] (retail)       [phone]       [place], [place]  [address]       Ph: [phone]       Fax: [phone]   RxID:   [id number]     Appended Document: rov for severe emphysema reviewed

## 2010-05-17 NOTE — Letter (Signed)
Summary: CMN[facility]  CMN/[facility]   Imported By: Lester Delanson 06/28/2009 10:19:51  _____________________________________________________________________  External Attachment:    Type:   Image     Comment:   External Document

## 2010-05-17 NOTE — Letter (Signed)
Summary: [facility] Instructions  [facility]  [address] [place], [place] [address]   Phone: [phone]  Fax: [phone]       Nancy Blair    1935/06/06    MRN: [id number]        Procedure Day Dorna Bloom:  Wednesday   12-29-09     Arrival Time:  9:00 a.m.     Procedure Time:  10:00 a.m.     Location of Procedure:                    _x_  [facility] (4th Floor)   PREPARATION FOR COLONOSCOPY WITH MOVIPREP   Starting 5 days prior to your procedure  12-24-09 do not eat nuts, seeds, popcorn, corn, beans, peas,  salads, or any raw vegetables.  Do not take any fiber supplements (e.g. Metamucil, Citrucel, and Benefiber).  THE DAY BEFORE YOUR PROCEDURE         DATE: 12-28-09   DAY: Tuesday  1.  Drink clear liquids the entire day-NO SOLID FOOD  2.  Do not drink anything colored red or purple.  Avoid juices with pulp.  No orange juice.  3.  Drink at least 64 oz. (8 glasses) of fluid/clear liquids during the day to prevent dehydration and help the prep work efficiently.  CLEAR LIQUIDS INCLUDE: Water Jello Ice Popsicles Tea (sugar ok, no milk/cream) Powdered fruit flavored drinks Coffee (sugar ok, no milk/cream) Gatorade Juice: apple, white grape, white cranberry  Lemonade Clear bullion, consomm, broth Carbonated beverages (any kind) Strained chicken noodle soup Hard Candy                             4.  In the morning, mix first dose of MoviPrep solution:    Empty 1 Pouch A and 1 Pouch B into the disposable container    Add lukewarm drinking water to the top line of the container. Mix to dissolve    Refrigerate (mixed solution should be used within 24 hrs)  5.  Begin drinking the prep at 5:00 p.m. The MoviPrep container is divided by 4 marks.   Every 15 minutes drink the solution down to the next mark (approximately 8 oz) until the full liter is complete.   6.  Follow completed prep with 16 oz of clear liquid of your choice (Nothing red or  purple).  Continue to drink clear liquids until bedtime.  7.  Before going to bed, mix second dose of MoviPrep solution:    Empty 1 Pouch A and 1 Pouch B into the disposable container    Add lukewarm drinking water to the top line of the container. Mix to dissolve    Refrigerate  THE DAY OF YOUR PROCEDURE      DATE: 12-29-09   DAY: Wednesday  Beginning at  5:00 a.m. (5 hours before procedure):         1. Every 15 minutes, drink the solution down to the next mark (approx 8 oz) until the full liter is complete.  2. Follow completed prep with 16 oz. of clear liquid of your choice.    3. You may drink clear liquids until 8:00 a.m.  (2 HOURS BEFORE PROCEDURE).   MEDICATION INSTRUCTIONS  Unless otherwise instructed, you should take regular prescription medications with a small sip of water   as early as possible the morning of your procedure.   Additional medication instructions:   Hold Triamterene/HCTZ  the morning of procedure.         OTHER INSTRUCTIONS  You will need a responsible adult at least 73 years of age to accompany you and drive you home.   This person must remain in the waiting room during your procedure.  Wear loose fitting clothing that is easily removed.  Leave jewelry and other valuables at home.  However, you may wish to bring a book to read or  an iPod/MP3 player to listen to music as you wait for your procedure to start.  Remove all body piercing jewelry and leave at home.  Total time from sign-in until discharge is approximately 2-3 hours.  You should go home directly after your procedure and rest.  You can resume normal activities the  day after your procedure.  The day of your procedure you should not:   Drive   Make legal decisions   Operate machinery   Drink alcohol   Return to work  You will receive specific instructions about eating, activities and medications before you leave.    The above instructions have been reviewed and  explained to me by   Wyona Almas RN  December 15, 2009 11:37 AM     I fully understand and can verbalize these instructions _____________________________ Date _________

## 2010-05-17 NOTE — Progress Notes (Signed)
Summary: transplant-returned call  Phone Note Call from Patient Call back at Home Phone [phone]   Caller: Patient Call For: clance Summary of Call: pt wants to know if kc has talked to the transplant team at [facility] re: seeing pt. also wants to verify that records from her pcp have been received.  Initial call taken by: Tivis Ringer, CNA,  October 04, 2009 11:05 AM  Follow-up for Phone Call        Pt would like to know 1.  if [facility] has talked with the transplant team at [facility] yet and 2.  if he has received her records yet from her PCP. Will forward message to KC-pls advise.  Thanks! Gweneth Dimitri RN  October 04, 2009 11:13 AM   Additional Follow-up for Phone Call Additional follow up Details #1::        I called transplant nurse practioner last week and left message for her to call me...will try again I have not seen the records in question.  May want to ask megan   NO records received from PCP.  Aundra Millet Reynolds LPN  October 05, 2009 1:43 PM Additional Follow-up by: Barbaraann Share MD,  October 04, 2009 4:29 PM    Additional Follow-up for Phone Call Additional follow up Details #2::    pt returned call from kc. Leonides Sake.  October 05, 2009 1:26 PM  pt returned call  Philipp Deputy [facility]  October 06, 2009 9:37 AM   lmtcb  Philipp Deputy [facility]  October 07, 2009 12:03 PM   Spoke with pt and advised that [facility] has tried to contact transplant NP and had to [facility] but will keep trying to reach her.  I advised also that records from PCP not yet recieived.  Pt states that she will call PCP and have these sent again.   Follow-up by: Vernie Murders,  October 08, 2009 9:55 AM  Additional Follow-up for Phone Call Additional follow up Details #3:: Details for Additional Follow-up Action Taken:

## 2010-05-17 NOTE — Letter (Signed)
Summary: Results Letter  [facility]  [address] [place], [place] [address]   Phone: [phone]  Fax: [phone]        December 31, 2009 MRN: [id number]    [facility] [address] [place], [place]  [address]    Dear Ms. Whitacre,   The polyps removed during your recent procedure were proven to be adenomatous.  These are pre-cancerous polyps that may have grown into cancers if they had not been removed.  Based on current nationally recognized surveillance guidelines, I recommend that you have a repeat colonoscopy in 3 years.   We will therefore put your information in our reminder system and will contact you in 3 years to schedule a repeat procedure.  Please call if you have any questions or concerns.       Sincerely,  Rachael Fee MD  This letter has been electronically signed by your physician.  Appended Document: Results Letter letter mailed

## 2010-05-17 NOTE — Letter (Signed)
Summary: CMN for Oxygen / [facility]  CMN for Oxygen / [employer]   Imported By: Lennie Odor 01/13/2010 15:03:40  _____________________________________________________________________  External Attachment:    Type:   Image     Comment:   External Document

## 2010-05-17 NOTE — Progress Notes (Signed)
Summary: PPD results  Phone Note Call from Patient Call back at [phone]   Caller: [email or web address] Call For: clance Summary of Call: States pt says she had PPD done on 8/3, pls advise. Initial call taken by: Darletta Moll,  January 17, 2010 11:17 AM  Follow-up for Phone Call        called and spoke with [facility].  They are requesting pt's PPD results that were done 11/19/2009.  Results faxed to [facility] at [phone].  Aundra Millet Reynolds LPN  January 17, 2010 11:29 AM

## 2010-05-17 NOTE — Miscellaneous (Signed)
Summary: TB skin test/[facility]  TB skin test/[place] [facility]   Imported By: Maryln Gottron 01/21/2010 15:34:51  _____________________________________________________________________  External Attachment:    Type:   Image     Comment:   External Document

## 2010-05-17 NOTE — Miscellaneous (Signed)
Summary: [facility] Previsit/prep  Clinical Lists Changes  Medications: Added new medication of MOVIPREP 100 GM  SOLR (PEG-KCL-NACL-NASULF-NA ASC-C) As per prep instructions. - Signed Rx of MOVIPREP 100 GM  SOLR (PEG-KCL-NACL-NASULF-NA ASC-C) As per prep instructions.;  #1 x 0;  Signed;  Entered by: Wyona Almas RN;  Authorized by: Rachael Fee MD;  Method used: Electronically to [facility] [phone] [place], [place], [place]  [address], Ph: [phone], Fax: [phone] Observations: Added new observation of ALLERGY REV: Done (12/15/2009 10:22)    Prescriptions: MOVIPREP 100 GM  SOLR (PEG-KCL-NACL-NASULF-NA ASC-C) As per prep instructions.  #1 x 0   Entered by:   Wyona Almas RN   Authorized by:   Rachael Fee MD   Signed by:   Wyona Almas RN on 12/15/2009   Method used:   Electronically to        [facility] [address] (retail)       [phone]       [place], [place]  [address]       Ph: [phone]       Fax: [phone]   RxID:   [id number]

## 2010-05-17 NOTE — Consult Note (Signed)
Summary: [facility]-Possible Lung Transplant  [facility]-Possible Lung Transplant   Imported By: Maryln Gottron 01/19/2010 10:02:31  _____________________________________________________________________  External Attachment:    Type:   Image     Comment:   External Document

## 2010-05-17 NOTE — Progress Notes (Signed)
Summary: Nancy Blair w/[facility]  Phone Note From Other Clinic   Caller: heather w/[employer] Call For: [name] Summary of Call: Nancy Blair is waiting on signature from Dr. Shelle Iron plz ref previous notes,....section C question 5 needs to be completed.......Marland Kitchenfax#[phone]...Marland KitchenMarland KitchenI advised rep it would be faxed as soon as completed by Dr Shelle Iron. Initial call taken by: Tivis Ringer, CNA,  June 24, 2009 5:00 PM  Follow-up for Phone Call        has this been done yet? Please advise. Carron Curie CMA  June 24, 2009 5:04 PM   this has already been taking care of. KC completed it last night and was faxed approx 5:30 last night.  Aundra Millet Reynolds LPN  June 25, 2009 8:42 AM

## 2010-05-17 NOTE — Progress Notes (Signed)
Summary: Temazepam refill  Phone Note Call from Patient   Summary of Call: Pt called and left a message stating her Temazepam wasn't called in on Friday?  I called [facility] and they stated the Temazepam was called in on Friday and the Rx is ready to be picked up. I left a message for the pt to return my call. Initial call taken by: Josph Macho RMA,  December 27, 2009 12:12 PM

## 2010-05-17 NOTE — Assessment & Plan Note (Signed)
Summary: acute sick visit for copd exacerbation.   Copy to:  Rickard Patience Primary Provider/Referring Provider:  Cecille Rubin  CC:  pt c/o cough with thick white phlem, wheezing, increased sob, chest pain, chest congestion, and post nasal drip x 3 days.  History of Present Illness: The pt comes in today for an acute sick visit.  She has known severe emphysema with chronic respiratory failure, and is being evaluated at [facility] for possible lung transplant.  She gives a 6day h/o runny nose, sore throat, then cough with tinted mucus and worsening chest congestion.  She has since developed chest tightness, and is having worsening sob.  She is concerned over ongoing symptoms.  She denies any f/c/s.  Current Medications (verified): 1)  Levothyroxine Sodium 112 Mcg Tabs (Levothyroxine Sodium) .... Once Daily 2)  Lovastatin 20 Mg Tabs (Lovastatin) .... Take 1 Tablet By Mouth Once A Day At Bedtime 3)  Oxygen 4l/m  [employer] .... Use Cont Except At Bedtime Then Removes 4)  Citalopram Hydrobromide 20 Mg Tabs (Citalopram Hydrobromide) .... Take 1 Tablet By Mouth Once A Day 5)  Temazepam 30 Mg Caps (Temazepam) .... Take 1 Capsule By Mouth Once A Day As Needed Sleep 6)  Bayer Aspirin 325 Mg Tabs (Aspirin) .... Take 1 Tablet By Mouth Once A Day 7)  Calcium Carbonate-Vitamin D 600-400 Mg-Unit  Tabs (Calcium Carbonate-Vitamin D) .... Take 1 Tablet By Mouth Two Times A Day 8)  Spiriva Handihaler 18 Mcg Caps (Tiotropium Bromide Monohydrate) .Marland Kitchen.. 1 Puff Daily This Was Sent To [email or web address] 9)  Diltiazem Hcl 90 Mg Tabs (Diltiazem Hcl) .... Take 1 Tablet By Mouth Once A Day 10)  Symbicort 160-4.5 Mcg/act Aero (Budesonide-Formoterol Fumarate) .... 2 Inhalantions A M and  Pm For Copd 11)  Ventolin Hfa 108 (90 Base) Mcg/act Aers (Albuterol Sulfate) .... 2 Puffs Three Times A Day As Needed 12)  Triamterene-Hctz 37.5-25 Mg Tabs (Triamterene-Hctz) .Marland Kitchen.. 1 By Mouth Once Daily As Needed Swelling.  Allergies (verified): 1)   ! Pcn 2)  ! Neosporin  Past History:  Past medical, surgical, family and social histories (including risk factors) reviewed, and no changes noted (except as noted below).  Past Medical History: Reviewed history from 06/17/2009 and no changes required. Emphysema Hypertension Allergic Rhinitis Hyperlipidemia eczema  Past Surgical History: Reviewed history from 01/28/2008 and no changes required. Appendectomy Hysterectomy Breast Biopsy B9 Vein stripping and ligation, no hx DVT Tonsils  Family History: Reviewed history from 06/17/2009 and no changes required. Family History Hypertension Family History of Stroke M 1st degree relative <50 2 children w/ asthma Twin sister- no smoking, no lung disease. heart disease: mother, father cancer: sister (pancreatic)   Social History: Reviewed history from 06/17/2009 and no changes required. 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 Former Smoker- 2 ppd x 40+ years.  quit 1990.   Alcohol use-yes Regular exercise-yes  Review of Systems       The patient complains of shortness of breath with activity, shortness of breath at rest, productive cough, chest pain, and nasal congestion/difficulty breathing through nose.  The patient denies non-productive cough, coughing up blood, irregular heartbeats, acid heartburn, indigestion, loss of appetite, weight change, abdominal pain, difficulty swallowing, sore throat, tooth/dental problems, headaches, sneezing, itching, ear ache, anxiety, depression, hand/feet swelling, joint stiffness or pain, rash, change in color of mucus, and fever.    Vital Signs:  Patient profile:   75 year old female Height:      60 inches  Weight:      122 pounds BMI:     23.91 O2 Sat:      94 % on 4 L/min continous Temp:     97.6 degrees F oral Pulse rate:   64 / minute BP sitting:   122 / 78  (left arm) Cuff size:   regular  Vitals Entered By: Carver Fila (February 14, 2010  3:12 PM)  O2 Flow:  4 L/min continous CC: pt c/o cough with thick white phlem, wheezing, increased sob, chest pain, chest congestion, post nasal drip x 3 days Comments meds and allergies updated Phone number updated Mindy Silva  February 14, 2010 3:12 PM    Physical Exam  General:  wd female in nad Nose:  no purulence or discharge noted Mouth:  no exudates or lesions seen Lungs:  decreased bs, a few rhonchi but no wheezing Heart:  rrr, no mrg Extremities:  no edema or cyanosis  Neurologic:  alert and oriented, moves all 4.   Impression & Recommendations:  Problem # 1:  CHRONIC OBSTRUCTIVE PULMONARY DISEASE, ACUTE EXACERBATION (ICD-491.21) the pt's history is most c/w an acute copd exacerbation.  It is unclear whether she has an acute viral vs, bacterial infection, but she has no lung reserve and will need aggressive treatment in order to prevent decompensation.  Will treat her with a an abx as well as a course of prednisone.  She is to call if she is not seeing improvement after 3-4 disease.  Medications Added to Medication List This Visit: 1)  Prednisone 10 Mg Tabs (Prednisone) .... Take 4 each day for 2 days, then 3 each day for 2 days, then 2 each day for 2 days, then 1 each day for 2 days, then stop 2)  Levaquin 750 Mg Tabs (Levofloxacin) .... One tablet by mouth daily  Patient Instructions: 1)  will treat with a short course of prednisone. 2)  levaquin 750mg  one each day for 5 days 3)  please call if not improving by the end of the week.   Prescriptions: LEVAQUIN 750 MG  TABS (LEVOFLOXACIN) One tablet by mouth daily  #5 x 0   Entered and Authorized by:   Barbaraann Share MD   Signed by:   Barbaraann Share MD on 02/14/2010   Method used:   Print then Give to Patient   RxID:   [id number] PREDNISONE 10 MG  TABS (PREDNISONE) take 4 each day for 2 days, then 3 each day for 2 days, then 2 each day for 2 days, then 1 each day for 2 days, then stop  #20 x 0   Entered and  Authorized by:   Barbaraann Share MD   Signed by:   Barbaraann Share MD on 02/14/2010   Method used:   Electronically to        [facility] [address] (retail)       [phone]       [place], [place]  [address]       Ph: [phone]       Fax: [phone]   RxID:   [id number]     Appended Document: Orders Update    Clinical Lists Changes  Orders: Added new Service order of Est. Patient Level IV ([address]) - Signed

## 2010-05-17 NOTE — Assessment & Plan Note (Signed)
Summary: TB/HEP B/TETANUS/CJR  Nurse Visit   Allergies: 1)  ! Pcn 2)  ! Neosporin  Immunizations Administered:  Tetanus Vaccine:    Vaccine Type: Td    Site: left deltoid    Mfr: Sanofi Pasteur    Dose: 0.5 ml    Route: IM    Given by: Pura Spice, RN    Exp. Date: 05/19/2011    Lot #: [id number]  PPD Skin Test:    Vaccine Type: PPD    Site: left forearm    Mfr: Sanofi Pasteur    Dose: 0.1 ml    Route: ID    Given by: C. Freeman,CMA    Exp. Date: 01/28/2011    Lot #: [id number]  Hepatitis B Vaccine # 1:    Vaccine Type: HepB NB-9yrs    Site: right deltoid    Mfr: Merck    Dose: 0.1 ml    Route: IM    Given by: Pura Spice, RN    Exp. Date: 08/15/2011    Lot #: [id number]  Orders Added: 1)  TD Toxoids IM 7 YR + [90714] 2)  Admin 1st Vaccine [90471] 3)  TB Skin Test [21308] 4)  Admin of Any Addtl Vaccine [90472] 5)  Hepatitis B Vaccine NB-81yrs [65784] pt here for immunizations only as she is on transplant list at [facility]. ..gh rn........Marland Kitchen #1 Hep B given .gh rn.......Marland Kitchen

## 2010-05-17 NOTE — Progress Notes (Signed)
Summary: pulmonary rehab  Phone Note Call from Patient   Caller: Patient Call For: Khris Jansson Summary of Call: pt calling to see when she can start pulmonary rehab Initial call taken by: Rickard Patience,  June 29, 2009 12:00 PM  Follow-up for Phone Call        advised pt it takes rehab at least 1 month to get pt set up for appt to start because of high pt volume. Pt staets understanding. Carron Curie CMA  June 29, 2009 12:08 PM

## 2010-05-17 NOTE — Progress Notes (Signed)
Summary: REQ FOR MED  Phone Note Call from Patient   Caller: Patient @ [phone] Reason for Call: Refill Medication Summary of Call: Pt called in to adv that she needs to have a RX for med (Ventolin - Albuterol) called in to [facility] on [address]...Marland KitchenMarland KitchenPt adv that the med that she was put on to replace Ventolin [employer] trader) is making her sick... Pt req to be put back on Ventolin even though her insurance doesn't cover it, she will pay for cost because she knows it works and doesn't make her sick. Initial call taken by: Debbra Riding,  April 19, 2009 11:59 AM  Follow-up for Phone Call        ok per dr Johny Shears and was called in to [facility] [address]  Follow-up by: Pura Spice, RN,  April 20, 2009 9:36 AM  Additional Follow-up for Phone Call Additional follow up Details #1::        Left message for pt to pick up meds. Additional Follow-up by: Lynann Beaver CMA,  April 20, 2009 9:39 AM    New/Updated Medications: VENTOLIN HFA 108 (90 BASE) MCG/ACT AERS (ALBUTEROL SULFATE) 2 puffs three times a day as needed Prescriptions: VENTOLIN HFA 108 (90 BASE) MCG/ACT AERS (ALBUTEROL SULFATE) 2 puffs three times a day as needed  #1 x 6   Entered by:   Pura Spice, RN   Authorized by:   Judithann Sheen MD   Signed by:   Pura Spice, RN on 04/20/2009   Method used:   Electronically to        [facility] [address] (retail)       [phone]       [place], [place]  [address]       Ph: [phone]       Fax: [phone]   RxID:   [id number]

## 2010-05-17 NOTE — Progress Notes (Signed)
Summary: note needed  Phone Note Call from Patient Call back at Home Phone [phone]   Caller: vm Call For: stafford Summary of Call: Going on a trip & needs note about the oxygen saying what it is & what it's for so I can get it through the airport.  Call to pick up note or to let me you'll mail it. Initial call taken by: Rudy Jew, RN,  May 19, 2009 11:23 AM  Follow-up for Phone Call        Will write a letter to explain what the oxygenator is to take  on the plane

## 2010-05-17 NOTE — Procedures (Signed)
Summary: Colonoscopy  Patient: Nancy Blair Note: All result statuses are Final unless otherwise noted.  Tests: (1) Colonoscopy (COL)   COL Colonoscopy           DONE     [facility]     [address]     [place], [place]  [address]           COLONOSCOPY PROCEDURE REPORT           PATIENT:  [name], [name]  MR#:  [id number]     BIRTHDATE:  Feb 17, 1936, 74 yrs. old  GENDER:  female     ENDOSCOPIST:  Rachael Fee, MD     REF. BY:  Dianna Limbo, M.D.     PROCEDURE DATE:  12/29/2009     PROCEDURE:  Colonoscopy with snare polypectomy     ASA CLASS:  Class III     INDICATIONS:  Routine Risk Screening     MEDICATIONS:   Fentanyl 25 mcg IV, Versed 4 mg IV     DESCRIPTION OF PROCEDURE:   After the risks benefits and     alternatives of the procedure were thoroughly explained, informed     consent was obtained.  Digital rectal exam was performed and     revealed no rectal masses.   The LB [id number]-[id number] endoscope     was introduced through the anus and advanced to the cecum, which     was identified by both the appendix and ileocecal valve, without     limitations.  The quality of the prep was good, using MoviPrep.     The instrument was then slowly withdrawn as the colon was fully     examined.     <<PROCEDUREIMAGES>>     FINDINGS:  Three sessile polyps were found, all were removed with     cold snare and sent to pathology (jar 1). These measured 2-5mm     across, located in ascending/transverse/rectum segments (see     image3).  Melanosis coli was found throughout the colon.  Mild     diverticulosis was found in the sigmoid to descending colon     segments.  This was otherwise a normal examination of the colon     (see image1, image2, and image5).   Retroflexed views in the     rectum revealed no abnormalities.    The scope was then withdrawn     from the patient and the procedure completed.     COMPLICATIONS:  None     ENDOSCOPIC IMPRESSION:     1) Three  small polyps, all removed and sent to pathology     2) Melanosis throughout the colon     3) Mild diverticulosis in the sigmoid to descending colon     segments     4) Otherwise normal examination           RECOMMENDATIONS:     1) If the polyp(s) removed today are proven to be adenomatous     (pre-cancerous) polyps, you will need a colonoscopy in 3-5 years.     Otherwise you should continue to follow colorectal cancer     screening guidelines for "routine risk" patients with a     colonoscopy in 10 years.     2) You will receive a letter within 1-2 weeks with the results     of your biopsy as well as final recommendations. Please call my     office if you have not received a  letter after 3 weeks.           ______________________________     Rachael Fee, MD           n.     eSIGNED:   Rachael Fee at 12/29/2009 10:36 AM           [place], [place], [phone]  Note: An exclamation mark (!) indicates a result that was not dispersed into the flowsheet. Document Creation Date: 12/29/2009 10:37 AM _______________________________________________________________________  (1) Order result status: Final Collection or observation date-time: 12/29/2009 10:31 Requested date-time:  Receipt date-time:  Reported date-time:  Referring Physician:   Ordering Physician: Rob Bunting [phone]) Specimen Source:  Source: Launa Grill Order Number: [id number] Lab site:   Appended Document: Colonoscopy     Procedures Next Due Date:    Colonoscopy: 12/2012

## 2010-05-17 NOTE — Letter (Signed)
Summary: [facility]  [facility]   Imported By: Maryln Gottron 12/14/2009 15:40:55  _____________________________________________________________________  External Attachment:    Type:   Image     Comment:   External Document

## 2010-05-17 NOTE — Progress Notes (Signed)
Summary: portable 02  Phone Note Call from Patient Call back at Home Phone [phone]   Caller: Patient Call For: nadel Reason for Call: Talk to Nurse Summary of Call: [facility] told  pt portable 02 ordered for pt.  They were suppose to send a rep to our office to pick up order.  Pt still hasn't heard from [facility]?  Can you pleae check on thsi? Initial call taken by: Eugene Gavia,  November 22, 2009 3:16 PM  Follow-up for Phone Call        per EMR, looks like TP sent order to [facility] on 11-19-2009 for oxygen. Pt still hasn't heard from [facility] re oxygen.  Will forward message to [facility] to address.  Aundra Millet Reynolds LPN  November 23, 2009 10:53 AM  Spoke with Efraim Kaufmann at [place] [facility]. [facility] was needing o2 sats. Per Truett Perna will contact pt today. Rhonda Cobb  November 23, 2009 11:05 AM Called and spoke with pt's husband (pt was at rehab) and advised of above and that they should hear from [place] [facility] today. Rhonda Cobb  November 23, 2009 11:08 AM

## 2010-05-17 NOTE — Progress Notes (Signed)
Summary: transplant/ [facility] status/ low O2-CALL AFTER 1:30 PM  Phone Note Call from Patient Call back at Home Phone [phone]   Caller: Patient Call For: clance Summary of Call: pt called again re: transplant team in [facility]. wanted to know the status. pt also wants kc to know that she is almost through w/ pulm rehab. was on 2L O2 and now on 6L. says it drops "rapidly"- barely stays above 90 on 6L. call pt after 1:30 today when she will be home.  Initial call taken by: Tivis Ringer, CNA,  October 14, 2009 9:05 AM  Follow-up for Phone Call        Spoke with pt.  She is calling again to check and see if you have heard anything from [facility] regarding transplant.  Please advise thanks! Follow-up by: Vernie Murders,  October 14, 2009 1:47 PM  Additional Follow-up for Phone Call Additional follow up Details #1::        let her know that I am awaiting email from the director of pulmonary transplant at [facility].  I will call her as soon as I know something.   regarding her sats, if she is requiring 6lpm at REST to maintain sats, she needs ov.  If she it is with exertion, part of the problem is either pulsed oxygen, or what she is doing is too much for her at that time. Additional Follow-up by: Barbaraann Share MD,  October 14, 2009 4:41 PM    Additional Follow-up for Phone Call Additional follow up Details #2::    called spoke with patient, advised her of KC's response.  pt verbalized her understanding regarding the e-mail from the pulm transplant director at [facility].  pt states that she is using the oxygen at 6L/min w/ activity and 4L/min at rest.  appt scheduled with [facility] 10-21-09 @ 1100.  pt okay with waiting 1 week, declined sooner appt with TP.  advised pt to call if her symptoms worsen before OV.  pt verbalized her understanding. Boone Master CNA/MA  October 14, 2009 5:23 PM    Appended Document: transplant/ [facility] status/ low O2-CALL AFTER 1:30 PM megan, did we ever receive the paperwork for transplant referral on  this pt from [facility]?  Appended Document: transplant/ [facility] status/ low O2-CALL AFTER 1:30 PM received paperwork and put in your very important look at folder for you to review.

## 2010-05-17 NOTE — Assessment & Plan Note (Signed)
Summary: tb reading with judy//ccm  Nurse Visit   Allergies: 1)  ! Pcn 2)  ! Neosporin  PPD Results    Date of reading: 11/19/2009    Results: < 5mm    Interpretation: negative

## 2010-05-17 NOTE — Progress Notes (Signed)
Summary: increase o2 liter flow?   Phone Note Call from Patient Call back at Home Phone [phone]   Caller: Patient Call For: clance Summary of Call: Wants to know if it will be ok to increase her O2 to 5 liters while she is working around the house, re: difficulty breathing. Also as an [place], she went to [facility] and it turned out very well, waiting on phone call 9/27 to see if she will be put in program. Initial call taken by: Darletta Moll,  January 07, 2010 11:48 AM  Follow-up for Phone Call        [facility] to discuss pt SOB.Carron Curie CMA  January 07, 2010 12:14 PM  Pt states that she is currenlty on 4 liters with exertion, but she is asking if she can increase to 5 liters with exertion because she is still have SOB on 4 liters. Please advise.  Pt also wanted KC to know sh ehas been to [facility] and completed all tests and will be contacted on 01-11-10 about if she gets in the program there. Carron Curie CMA  January 07, 2010 12:27 PM    Additional Follow-up for Phone Call Additional follow up Details #1::        let her know that most of time, shortness of breath is not an oxygen problem but a lack of pulmonary reserve problem.  Would not want to increase her oxygen unless sats were less than 90%.  Also let her know that too much oxygen can be toxic to her, and slow down her breathing drive.  I would not recommend turning up oxygen unless we know for a fact her oxygen level is the issue. Additional Follow-up by: Barbaraann Share MD,  January 07, 2010 2:48 PM    Additional Follow-up for Phone Call Additional follow up Details #2::    called spoke with patient, informed her of KC's recs as stated above.  pt verbalized her understanding about the low sats vs. pulm reserve, and the recommendation to not increase her o2 until we know that her o2 level is the issue.  pt has appt with [facility] 10.3.11 - will keep this appt. Follow-up by: Boone Master CNA/MA,  January 07, 2010 3:01  PM

## 2010-05-17 NOTE — Progress Notes (Signed)
Summary: Form  Phone Note Call from Patient Call back at Home Phone [phone]   Caller: Patient Call For: Jacob Chamblee Reason for Call: Talk to Nurse Summary of Call: need to sign, date and fax form back to [employer] asap.  Please contact pt when done. Initial call taken by: Eugene Gavia,  June 21, 2009 2:31 PM  Follow-up for Phone Call        do you have this form? Carron Curie CMA  June 21, 2009 3:19 PM  No. I do not.  Was she going to fax it?  Mail it? Drop it off?  Aundra Millet Reynolds LPN  June 22, 979 3:34 PM   pt brough form on day of ov and states KC filled it out and [facility] faxed it. So I found form in [facility] scan and it was signed and dated at time of OV but I refaxed it any way. pt aware form completed and refaxed. Carron Curie CMA  June 21, 2009 3:46 PM

## 2010-05-17 NOTE — Progress Notes (Signed)
Summary: order for continuous flow portable o2 to [facility]  Phone Note Call from Patient Call back at Home Phone [phone]   Caller: Patient Call For: clance Summary of Call: pt requests order for "full flow O2"- [facility] has already been at her home today to demonstrate this.  Initial call taken by: Tivis Ringer, CNA,  November 19, 2009 11:22 AM  Follow-up for Phone Call        pt seen by TP yesteday.  she is requesting an order be placed for her to have portbale o2 that provides continuous o2.  TP, may we place this order?  pt uses [facility]. Boone Master CNA/MA  November 19, 2009 11:32 AM   Additional Follow-up for Phone Call Additional follow up Details #1::        yes Additional Follow-up by: Rubye Oaks NP,  November 19, 2009 11:35 AM    Additional Follow-up for Phone Call Additional follow up Details #2::    order placed in EMR.  [facility] TCB to inform pt of pending order. Boone Master CNA/MA  November 19, 2009 11:55 AM   pt returned call.  advised of pending order.  pt verbalized her understanding. Follow-up by: Boone Master CNA/MA,  November 19, 2009 12:11 PM

## 2010-05-17 NOTE — Progress Notes (Signed)
Summary: order request for [facility] to pick up equip  Phone Note Call from Patient Call back at Home Phone [phone]   Caller: vm Wed Summary of Call: Need an order faxed to [facility] that I no longer need their equipment.  Got the equipment needed and I want [facility] to take theirs back.  Call me if questions.   Initial call taken by: Rudy Jew, RN,  July 01, 2009 8:42 AM  Follow-up for Phone Call        [facility]ified [facility] and spoke with beth shull and order faxed to [phone] to pick up O2 equipment.  Follow-up by: Pura Spice, RN,  July 01, 2009 10:18 AM

## 2010-05-18 ENCOUNTER — Ambulatory Visit: Admit: 2010-05-18 | Payer: Self-pay | Admitting: Pulmonary Disease

## 2010-05-18 ENCOUNTER — Ambulatory Visit: Payer: Self-pay | Admitting: Pulmonary Disease

## 2010-05-19 NOTE — Progress Notes (Signed)
Summary: samples please  Phone Note Refill Request Message from:  Patient  Refills Requested: Medication #1:  SPIRIVA HANDIHALER 18 MCG CAPS 1 puff daily this was sent to [email or web address]  Medication #2:  SYMBICORT 160-4.5 MCG/ACT AERO 2 inhalantions A M and  PM for COPD Pt is requesting samples  Initial call taken by: Heron Sabins,  March 29, 2010 11:49 AM  Follow-up for Phone Call        pt informed none avaiable Follow-up by: Willy Eddy, LPN,  March 29, 2010 2:15 PM

## 2010-05-19 NOTE — Letter (Addendum)
Summary: [facility]   Imported By: Maryln Gottron 04/12/2010 14:21:11  _____________________________________________________________________  External Attachment:    Type:   Image     Comment:   External Document

## 2010-05-19 NOTE — Progress Notes (Signed)
Summary: sample  Phone Note Call from Patient Call back at Home Phone [phone]   Caller: Patient Call For: clance Reason for Call: Talk to Nurse Summary of Call: Patient asking for samples of spiriva and symbicort. Initial call taken by: Lehman Prom,  March 29, 2010 11:45 AM  Follow-up for Phone Call        No samples available.  Pt statees no rx needed.  Has also called PCP for samples.   Follow-up by: Gweneth Dimitri RN,  March 29, 2010 11:58 AM

## 2010-05-19 NOTE — Letter (Signed)
Summary: [facility]  [facility]   Imported By: Maryln Gottron 04/28/2010 09:24:07  _____________________________________________________________________  External Attachment:    Type:   Image     Comment:   External Document

## 2010-07-12 ENCOUNTER — Encounter: Payer: Self-pay | Admitting: Family Medicine

## 2010-08-20 ENCOUNTER — Other Ambulatory Visit: Payer: Self-pay | Admitting: Family Medicine

## 2010-08-20 ENCOUNTER — Other Ambulatory Visit: Payer: Self-pay | Admitting: Pulmonary Disease

## 2010-08-23 ENCOUNTER — Other Ambulatory Visit: Payer: Self-pay

## 2010-08-23 MED ORDER — LEVOTHYROXINE SODIUM 112 MCG PO TABS: 112.0000 ug | ORAL_TABLET | Freq: Every day | ORAL | Status: DC

## 2010-08-23 NOTE — Telephone Encounter (Signed)
rx sent to lev[facility] to [facility] [address]

## 2010-09-08 ENCOUNTER — Ambulatory Visit (INDEPENDENT_AMBULATORY_CARE_PROVIDER_SITE_OTHER): Payer: Medicare Other | Admitting: Family Medicine

## 2010-09-08 ENCOUNTER — Encounter: Payer: Self-pay | Admitting: Family Medicine

## 2010-09-08 VITALS — BP 128/62 | HR 71 | Temp 98.2°F | Wt 120.5 lb

## 2010-09-08 DIAGNOSIS — Z942 Lung transplant status: Secondary | ICD-10-CM

## 2010-09-11 ENCOUNTER — Encounter: Payer: Self-pay | Admitting: Family Medicine

## 2010-09-11 NOTE — Patient Instructions (Signed)
Filled out handicap form to take to the local license dept

## 2010-09-11 NOTE — Progress Notes (Signed)
  Subjective:    Patient ID: Nancy Blair, female    DOB: May 13, 1935, 75 y.o.   MRN: [id number] This 75 year old white married female is in today with a history of having had total lung replacement in the last 3 months has been doing her well infarction in without oxygen his head severe COPD needing oxygen and 25 L even at rest she is doing fine but towards long walks at this time and request a handicap sticker for her car she has no other medical complaints at this timeHPI    Review of Systems see history of present illness     Objective:   Physical Examthe patient is a well-developed well-nourished white female who breathed and normal and has no distress happy and very pleasant Heart exam normal no enlarged heart regular rhythm Lungs are clear to palpation and percussion bilaterally no dullness no wheezing no rales       Assessment & Plan:  Post total lung replacement COPD cured Signed handicapped form Does not  need any medication refills at this time

## 2010-09-13 ENCOUNTER — Ambulatory Visit: Payer: Self-pay | Admitting: Family Medicine

## 2010-12-07 DIAGNOSIS — R845 Abnormal microbiological findings in specimens from respiratory organs and thorax: Secondary | ICD-10-CM | POA: Insufficient documentation

## 2011-01-23 ENCOUNTER — Telehealth: Payer: Self-pay | Admitting: *Deleted

## 2011-01-23 NOTE — Telephone Encounter (Signed)
[facility] Nurse called to let us know Pt's BP was elevated at her Physical today 174/90.  She is being followed at [facility] for lung transplant, and DM and HT right now.  Has appt there in 3 weeks.  Offered her an appt to come see of the the MDs for her BP and to get a flu shot but she refuses stating the only reason her BP is up is because this is a new nurse.  She usually is followed by [facility].  Pt was advised if she changes her mind, to please call for appt.

## 2011-01-24 ENCOUNTER — Ambulatory Visit: Payer: Medicare Other | Admitting: Family Medicine

## 2011-01-24 NOTE — Telephone Encounter (Signed)
noted 

## 2011-01-31 DIAGNOSIS — Z79899 Other long term (current) drug therapy: Secondary | ICD-10-CM | POA: Insufficient documentation

## 2011-04-21 ENCOUNTER — Ambulatory Visit: Payer: Medicare Other | Admitting: Family Medicine

## 2011-04-27 ENCOUNTER — Encounter: Payer: Self-pay | Admitting: Family Medicine

## 2011-04-27 ENCOUNTER — Ambulatory Visit (INDEPENDENT_AMBULATORY_CARE_PROVIDER_SITE_OTHER): Payer: Medicare Other | Admitting: Family Medicine

## 2011-04-27 DIAGNOSIS — I10 Essential (primary) hypertension: Secondary | ICD-10-CM

## 2011-04-27 DIAGNOSIS — E785 Hyperlipidemia, unspecified: Secondary | ICD-10-CM

## 2011-04-27 DIAGNOSIS — L089 Local infection of the skin and subcutaneous tissue, unspecified: Secondary | ICD-10-CM | POA: Insufficient documentation

## 2011-04-27 DIAGNOSIS — Z942 Lung transplant status: Secondary | ICD-10-CM

## 2011-04-27 MED ORDER — DOXYCYCLINE HYCLATE 100 MG PO TABS: 100.0000 mg | ORAL_TABLET | Freq: Two times a day (BID) | ORAL | Status: AC

## 2011-04-27 NOTE — Assessment & Plan Note (Signed)
Chronic problem.  Fair control.  Asymptomatic.  No changes at this time.

## 2011-04-27 NOTE — Assessment & Plan Note (Signed)
Pt still following w/ [facility], local pulm is Dr Shelle Iron.  Last bronch showed mild rejection.  Has bronch upcoming.  Will follow along.

## 2011-04-27 NOTE — Progress Notes (Signed)
  Subjective:    Patient ID: Nancy Blair, female    DOB: Oct 30, 1935, 76 y.o.   MRN: [id number]  HPI New to establish.  Previous MD- Scotty Court.  Health Maintenance- overdue for mammo but not interested at this time due to post-op chest pain.  Colonoscopy 2012.  Pap 2012.  Hyperlipidemia- chronic problem, on Lovastatin.  No abd pain, N/V.  HTN- chronic problem, well controlled today.  On dilt.  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  On Prograf, Dapsone, Prednisone.  Has not had bone density.  Anemia- chronic problem, not on iron, not currently being treated.  Following w/ regular labs.   Review of Systems For ROS see HPI     Objective:   Physical Exam  Vitals reviewed. Constitutional: She appears well-developed and well-nourished. No distress.  HENT:  Head: Normocephalic and atraumatic.  Eyes:       L eye droop (chronic)  Cardiovascular: Normal rate and regular rhythm.   Murmur (I/VI SEM) heard. Pulmonary/Chest: Effort normal and breath sounds normal. No respiratory distress.  Skin: Skin is warm and dry.       1.5 cm lesion on L lower leg w/ heaped up margins and central ulceration.  TTP, no drainage or fluctuance.          Assessment & Plan:

## 2011-04-27 NOTE — Assessment & Plan Note (Signed)
Chronic problem.  Tolerating statin w/out difficulty.  Overdue for labs.  Not fasting today.  Will return for CPE and will do labs at that time.

## 2011-04-27 NOTE — Patient Instructions (Signed)
Please schedule your complete physical in the next month at your convenience (don't eat before this) We'll call you with your wound center appt Start the Doxycycline- take w/ food to avoid upset stomach Call with any questions or concerns Welcome!  We're glad to have you!

## 2011-04-27 NOTE — Assessment & Plan Note (Signed)
L lower leg wound w/ heaped up granulation tissue, central ulceration.  + TTP.  No purulent drainage but poor healing.  Start abx.  Refer to wound care.  Pt expressed understanding and is in agreement w/ plan.

## 2011-05-04 ENCOUNTER — Encounter (HOSPITAL_BASED_OUTPATIENT_CLINIC_OR_DEPARTMENT_OTHER): Payer: Medicare Other | Attending: Internal Medicine

## 2011-05-04 DIAGNOSIS — Z942 Lung transplant status: Secondary | ICD-10-CM | POA: Insufficient documentation

## 2011-05-04 DIAGNOSIS — T380X5A Adverse effect of glucocorticoids and synthetic analogues, initial encounter: Secondary | ICD-10-CM | POA: Insufficient documentation

## 2011-05-04 DIAGNOSIS — K219 Gastro-esophageal reflux disease without esophagitis: Secondary | ICD-10-CM | POA: Insufficient documentation

## 2011-05-04 DIAGNOSIS — S91009A Unspecified open wound, unspecified ankle, initial encounter: Secondary | ICD-10-CM | POA: Insufficient documentation

## 2011-05-04 DIAGNOSIS — Z79899 Other long term (current) drug therapy: Secondary | ICD-10-CM | POA: Insufficient documentation

## 2011-05-04 DIAGNOSIS — E139 Other specified diabetes mellitus without complications: Secondary | ICD-10-CM | POA: Insufficient documentation

## 2011-05-04 DIAGNOSIS — IMO0002 Reserved for concepts with insufficient information to code with codable children: Secondary | ICD-10-CM | POA: Insufficient documentation

## 2011-05-04 DIAGNOSIS — Z794 Long term (current) use of insulin: Secondary | ICD-10-CM | POA: Insufficient documentation

## 2011-05-04 DIAGNOSIS — E785 Hyperlipidemia, unspecified: Secondary | ICD-10-CM | POA: Insufficient documentation

## 2011-05-04 DIAGNOSIS — E039 Hypothyroidism, unspecified: Secondary | ICD-10-CM | POA: Insufficient documentation

## 2011-05-04 DIAGNOSIS — I10 Essential (primary) hypertension: Secondary | ICD-10-CM | POA: Insufficient documentation

## 2011-05-04 DIAGNOSIS — S81009A Unspecified open wound, unspecified knee, initial encounter: Secondary | ICD-10-CM | POA: Insufficient documentation

## 2011-05-11 NOTE — Progress Notes (Signed)
[facility]  [name], [name]              ACCOUNT NO.:  [id number]  MEDICAL RECORD NO.:  [id number]      DATE OF BIRTH:  1936-02-10  PHYSICIAN:  Maxwell Caul, M.D. VISIT DATE:  05/11/2011                                  OFFICE VISIT   Nancy Blair is a 76 year old lady who is in our clinic today for review of the wound on the left anterior leg.  The patient states that this occurred 2 weeks ago when she hit her leg on a gate.  She went to her own primary physician.  She was given an antibiotic.  She is not certain whether cultures were done.  She was referred here.  She is a steroid- induced type 2 diabetic, has not had any prior wound issues.  She is a bilateral lung transplant.  The patient currently on Prograf and prednisone.  PAST MEDICAL HISTORY: 1. Hypertension. 2. Steroid-induced diabetes. 3. Anemia. 4. Hypothyroidism. 5. Gastroesophageal reflux. 6. Hyperlipidemia. 7. 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 8. Hysterectomy. 9. Tubal ligation. 10.History of vein stripping and bilateral vein ligation. 11.Bilateral cataracts.  MEDICATIONS: 1. Aspirin 81 daily. 2. Dapsone 50 mg daily. 3. Ganciclovir 65 IV daily (presumably CMV).  Complication of her     immunocompromised state. 4. Novolin insulin sliding scale. 5. She is on Synthroid. 6. Magnesium oxide. 7. Lopressor. 8. Nystatin. 9. Protonix. 10.Pravachol. 11.Prednisone 15 mg daily. 12.Prograf 4 mg in the a.m. and 5 mg in the p.m.  PHYSICAL EXAMINATION:  VITAL SIGNS:  Her temperature is 98.3, pulse 76, respirations 18, blood pressure is 189/72, weight 122 pounds. RESPIRATORY:  Revealed clear air entry bilaterally.  There was no crackles or wheezes.  She was not in any distress. CARDIAC:  Heart sounds are normal.  There is no signs of congestive heart failure. LYMPH:  Nonpalpable in the cervical, clavicular, or axillary areas.  WOUND EXAMINATION:  The wound was on the left  anterior leg over her tibia.  This was a small area, but nevertheless had raised edges.  Light surface debridement was done of the wound, which bled fairly profusely, but responded to direct pressure.  IMPRESSIONS:  Presumably traumatic wound to the left anterior lower extremity.  I asked the patient several times if there was a history of some form of lesion on this area before the presumed trauma each time she denied this.  The raised edges with the regular border made me somewhat concerned about this area. Nevertheless, the patient stuck to her history of trauma 2 weeks ago.  We prescribed a silver collagen to this wound with a Santyl underneath.  We applied a Kerlix, Coban wrap to this.  I did culture the area for CNS.  If this wound remains nonhealing, one would wonder about a punch biopsy.  However, given the simplicity of the history, we will see how she does with conservative care for now.  There was really no evidence of surrounding infection.          ______________________________ Maxwell Caul, M.D.     MGR/MEDQ  D:  05/11/2011  T:  05/11/2011  Job:  [id number]

## 2011-05-22 ENCOUNTER — Telehealth: Payer: Self-pay | Admitting: *Deleted

## 2011-05-22 ENCOUNTER — Other Ambulatory Visit: Payer: Self-pay | Admitting: Family Medicine

## 2011-05-22 LAB — CBC WITH DIFFERENTIAL/PLATELET
Basophils Absolute: 0.1 10*3/uL (ref 0.0–0.1)
Basophils Relative: 1 % (ref 0–1)
Eosinophils Absolute: 0.1 10*3/uL (ref 0.0–0.7)
Eosinophils Relative: 1 % (ref 0–5)
HCT: 23.9 % — ABNORMAL LOW (ref 36.0–46.0)
Hemoglobin: 7.1 g/dL — ABNORMAL LOW (ref 12.0–15.0)
Lymphocytes Relative: 40 % (ref 12–46)
Lymphs Abs: 1.9 10*3/uL (ref 0.7–4.0)
MCH: 32.3 pg (ref 26.0–34.0)
MCHC: 29.7 g/dL — ABNORMAL LOW (ref 30.0–36.0)
MCV: 108.6 fL — ABNORMAL HIGH (ref 78.0–100.0)
Monocytes Absolute: 0.6 10*3/uL (ref 0.1–1.0)
Monocytes Relative: 13 % — ABNORMAL HIGH (ref 3–12)
Neutro Abs: 2.1 10*3/uL (ref 1.7–7.7)
Neutrophils Relative %: 44 % (ref 43–77)
Platelets: 266 10*3/uL (ref 150–400)
RBC: 2.2 MIL/uL — ABNORMAL LOW (ref 3.87–5.11)
RDW: 14 % (ref 11.5–15.5)
WBC: 4.8 10*3/uL (ref 4.0–10.5)

## 2011-05-22 LAB — BASIC METABOLIC PANEL WITH GFR
BUN: 28 mg/dL — ABNORMAL HIGH (ref 6–23)
CO2: 24 mEq/L (ref 19–32)
Calcium: 9 mg/dL (ref 8.4–10.5)
Chloride: 108 mEq/L (ref 96–112)
Creat: 2.01 mg/dL — ABNORMAL HIGH (ref 0.50–1.10)
GFR, Est African American: 28 mL/min — ABNORMAL LOW
GFR, Est Non African American: 24 mL/min — ABNORMAL LOW
Glucose, Bld: 85 mg/dL (ref 70–99)
Potassium: 4 mEq/L (ref 3.5–5.3)
Sodium: 141 mEq/L (ref 135–145)

## 2011-05-22 LAB — VANCOMYCIN, TROUGH: Vancomycin Tr: 31.3 ug/mL — ABNORMAL HIGH (ref 10.0–20.0)

## 2011-05-22 NOTE — Telephone Encounter (Signed)
I did not order this medicine for her.  She has been seeing the wound center and her treating MD over there needs to adjust her med.

## 2011-05-22 NOTE — Telephone Encounter (Signed)
Spoke with [facility] who states that they did not order labs either. Called back over to [facility] and ask who was the order provider on labs per Latifah  Dr Elmo Putt is ordering provider Per Loney Loh will contact ordering provider. Tried to look up order physician unable to find contact info on provider.

## 2011-05-22 NOTE — Telephone Encounter (Signed)
Vancomycin high 31.3

## 2011-05-22 NOTE — Telephone Encounter (Signed)
Copy of noted printed and given to Dr [name] advising of abnormal lab

## 2011-05-22 NOTE — Telephone Encounter (Signed)
Called left message with labs tech at [facility] that Dr [name] was not order provider will need to contact wound care in refer to these labs. Spoke to wound center who advise they did not order labs.Ph [phone] and was advise by them that maybe labs order by [phone] [facility] Tried to call office is closed for lunch will try later.

## 2011-05-22 NOTE — Telephone Encounter (Signed)
Dr Frederico Hamman is a provider at [facility]- not sure how I'm getting this info.

## 2011-05-22 NOTE — Telephone Encounter (Signed)
Savannah from [facility] to give BMP orders for pt however no one in our system ordered the labs, rep stated Vanetta Mulders ordered the labs, rep stated that she would find the provider that placed the order, I did give rep our fax number as well. After call disconnected spoke to MD Tabori and she advised that MD Albon is with [facility], called [facility] back to advise the location of MD Albon noted per internet with [facility] and noted sent to vm, left message of contact information.

## 2011-05-24 ENCOUNTER — Telehealth: Payer: Self-pay | Admitting: *Deleted

## 2011-05-24 NOTE — Telephone Encounter (Signed)
Called pt to clarify the MD that she has seen recently that ordered her lab work noted that it was MD Vanetta Mulders at [facility], advised pt that she needs to call that office immediately to schedule and appt, called [facility] and spoke with Marcelino Duster to advise that we did not order these labs and per the critical matter MD Albon needs to be notified, Marcelino Duster advised that the rep from yesterday had called 5 different places to find whom sent the labs, advised that these need to be sent to this MD asap, Marcelino Duster advised that she noted there is an MD Albon on [employer] and she sent the labs there, I advised that the pt had seen the MD Albon whom information I had sent to their office via phone yesterday, Marcelino Duster advised she would take care of it, I spoke to MD tabori per concern for pt and printed the labs results and called MD Albon office to verify the fax number and alert as to concern for pt based on critical labs,spoke with MD Albon and advised the critical labs, faxed results to number [phone], MD Albon advised that she had noted many concerns about pt per she had started pt on Vancomycin ABT oral and via IV and wanted to make sure we did not have pt on any vancomycin, advised we did not prescribe vancomycin to pt. MD Albon understood.

## 2011-05-25 ENCOUNTER — Encounter (HOSPITAL_BASED_OUTPATIENT_CLINIC_OR_DEPARTMENT_OTHER): Payer: Medicare Other | Attending: Internal Medicine

## 2011-05-25 ENCOUNTER — Telehealth: Payer: Self-pay | Admitting: Family Medicine

## 2011-05-25 ENCOUNTER — Telehealth: Payer: Self-pay | Admitting: Gastroenterology

## 2011-05-25 DIAGNOSIS — A4902 Methicillin resistant Staphylococcus aureus infection, unspecified site: Secondary | ICD-10-CM | POA: Insufficient documentation

## 2011-05-25 DIAGNOSIS — E119 Type 2 diabetes mellitus without complications: Secondary | ICD-10-CM | POA: Insufficient documentation

## 2011-05-25 DIAGNOSIS — C44721 Squamous cell carcinoma of skin of unspecified lower limb, including hip: Secondary | ICD-10-CM | POA: Insufficient documentation

## 2011-05-25 DIAGNOSIS — D899 Disorder involving the immune mechanism, unspecified: Secondary | ICD-10-CM | POA: Insufficient documentation

## 2011-05-25 DIAGNOSIS — E039 Hypothyroidism, unspecified: Secondary | ICD-10-CM | POA: Insufficient documentation

## 2011-05-25 DIAGNOSIS — Z942 Lung transplant status: Secondary | ICD-10-CM | POA: Insufficient documentation

## 2011-05-25 DIAGNOSIS — E785 Hyperlipidemia, unspecified: Secondary | ICD-10-CM | POA: Insufficient documentation

## 2011-05-25 DIAGNOSIS — D649 Anemia, unspecified: Secondary | ICD-10-CM

## 2011-05-25 DIAGNOSIS — I10 Essential (primary) hypertension: Secondary | ICD-10-CM | POA: Insufficient documentation

## 2011-05-25 NOTE — Telephone Encounter (Signed)
Patient called stating she needs referral for endoscopy and colonoscopy as soon as possible.

## 2011-05-25 NOTE — Telephone Encounter (Signed)
Placed refferal for Gastrologist per MD Tabori instructions advised referral coordinator to see if we can get a rush order for pt to be seen, referral coordinator working on this currently.

## 2011-05-25 NOTE — Telephone Encounter (Signed)
Pt appt given for 05/26/11 830 with Dr Christella Hartigan pt aware

## 2011-05-25 NOTE — Telephone Encounter (Signed)
Please call pt and see if she has a GI doctor so that we can enter her referrals.

## 2011-05-26 ENCOUNTER — Ambulatory Visit (HOSPITAL_BASED_OUTPATIENT_CLINIC_OR_DEPARTMENT_OTHER)
Admission: RE | Admit: 2011-05-26 | Discharge: 2011-05-26 | Disposition: A | Discharge status: Other Acute Inpatient Hospital | Payer: Medicare Other | Source: Ambulatory Visit | Attending: Gastroenterology | Admitting: Gastroenterology

## 2011-05-26 ENCOUNTER — Encounter: Payer: Self-pay | Admitting: Gastroenterology

## 2011-05-26 ENCOUNTER — Encounter (HOSPITAL_COMMUNITY): Payer: Self-pay | Admitting: *Deleted

## 2011-05-26 ENCOUNTER — Ambulatory Visit (INDEPENDENT_AMBULATORY_CARE_PROVIDER_SITE_OTHER): Payer: Medicare Other | Admitting: Gastroenterology

## 2011-05-26 ENCOUNTER — Encounter (HOSPITAL_COMMUNITY)
Admission: RE | Disposition: A | Discharge status: Other Acute Inpatient Hospital | Payer: Self-pay | Source: Ambulatory Visit | Attending: Gastroenterology

## 2011-05-26 ENCOUNTER — Inpatient Hospital Stay (HOSPITAL_COMMUNITY)
Admission: AD | Admit: 2011-05-26 | Discharge: 2011-05-28 | DRG: 812 | Disposition: A | Discharge status: Home / Self Care | Payer: Medicare Other | Source: Ambulatory Visit | Attending: Internal Medicine | Admitting: Internal Medicine

## 2011-05-26 ENCOUNTER — Encounter (HOSPITAL_COMMUNITY): Payer: Self-pay

## 2011-05-26 DIAGNOSIS — Z9889 Other specified postprocedural states: Secondary | ICD-10-CM

## 2011-05-26 DIAGNOSIS — K2970 Gastritis, unspecified, without bleeding: Secondary | ICD-10-CM

## 2011-05-26 DIAGNOSIS — IMO0002 Reserved for concepts with insufficient information to code with codable children: Secondary | ICD-10-CM

## 2011-05-26 DIAGNOSIS — L089 Local infection of the skin and subcutaneous tissue, unspecified: Secondary | ICD-10-CM

## 2011-05-26 DIAGNOSIS — K921 Melena: Secondary | ICD-10-CM

## 2011-05-26 DIAGNOSIS — Z942 Lung transplant status: Secondary | ICD-10-CM

## 2011-05-26 DIAGNOSIS — D649 Anemia, unspecified: Secondary | ICD-10-CM

## 2011-05-26 DIAGNOSIS — N179 Acute kidney failure, unspecified: Secondary | ICD-10-CM | POA: Diagnosis present

## 2011-05-26 DIAGNOSIS — J4489 Other specified chronic obstructive pulmonary disease: Secondary | ICD-10-CM | POA: Diagnosis present

## 2011-05-26 DIAGNOSIS — K2990 Gastroduodenitis, unspecified, without bleeding: Secondary | ICD-10-CM

## 2011-05-26 DIAGNOSIS — I129 Hypertensive chronic kidney disease with stage 1 through stage 4 chronic kidney disease, or unspecified chronic kidney disease: Secondary | ICD-10-CM | POA: Diagnosis present

## 2011-05-26 DIAGNOSIS — D631 Anemia in chronic kidney disease: Principal | ICD-10-CM | POA: Diagnosis present

## 2011-05-26 DIAGNOSIS — I10 Essential (primary) hypertension: Secondary | ICD-10-CM

## 2011-05-26 DIAGNOSIS — J449 Chronic obstructive pulmonary disease, unspecified: Secondary | ICD-10-CM | POA: Diagnosis present

## 2011-05-26 DIAGNOSIS — T50995A Adverse effect of other drugs, medicaments and biological substances, initial encounter: Secondary | ICD-10-CM | POA: Diagnosis present

## 2011-05-26 DIAGNOSIS — E039 Hypothyroidism, unspecified: Secondary | ICD-10-CM | POA: Diagnosis present

## 2011-05-26 DIAGNOSIS — N189 Chronic kidney disease, unspecified: Secondary | ICD-10-CM | POA: Diagnosis present

## 2011-05-26 HISTORY — PX: ESOPHAGOGASTRODUODENOSCOPY: SHX5428

## 2011-05-26 LAB — DIFFERENTIAL
Basophils Absolute: 0.1 10*3/uL (ref 0.0–0.1)
Basophils Relative: 1 % (ref 0–1)
Eosinophils Relative: 1 % (ref 0–5)
Lymphocytes Relative: 30 % (ref 12–46)
Monocytes Relative: 7 % (ref 3–12)
Neutro Abs: 3.7 10*3/uL (ref 1.7–7.7)

## 2011-05-26 LAB — COMPREHENSIVE METABOLIC PANEL
BUN: 34 mg/dL — ABNORMAL HIGH (ref 6–23)
Calcium: 8.5 mg/dL (ref 8.4–10.5)
GFR calc Af Amer: 16 mL/min — ABNORMAL LOW (ref >90)
Glucose, Bld: 112 mg/dL — ABNORMAL HIGH (ref 70–99)
Sodium: 139 mEq/L (ref 135–145)
Total Protein: 5.5 g/dL — ABNORMAL LOW (ref 6.0–8.3)

## 2011-05-26 LAB — CBC
HCT: 21.8 % — ABNORMAL LOW (ref 36.0–46.0)
Hemoglobin: 6.7 g/dL — CL (ref 12.0–15.0)
RBC: 2.08 MIL/uL — ABNORMAL LOW (ref 3.87–5.11)
WBC: 6.1 10*3/uL (ref 4.0–10.5)

## 2011-05-26 LAB — GLUCOSE, CAPILLARY: Glucose-Capillary: 135 mg/dL — ABNORMAL HIGH (ref 70–99)

## 2011-05-26 LAB — PREPARE RBC (CROSSMATCH)

## 2011-05-26 SURGERY — EGD (ESOPHAGOGASTRODUODENOSCOPY)
Anesthesia: Moderate Sedation

## 2011-05-26 MED ORDER — CITALOPRAM HYDROBROMIDE 10 MG PO TABS
10.0000 mg | ORAL_TABLET | Freq: Every day | ORAL | Status: DC
  Administered 2011-05-26 – 2011-05-28 (×3): 10 mg via ORAL
  Filled 2011-05-26 (×4): qty 1

## 2011-05-26 MED ORDER — SODIUM CHLORIDE 0.9 % IV SOLN
75.0000 mg | INTRAVENOUS | Status: DC
  Administered 2011-05-26 – 2011-05-28 (×3): 75 mg via INTRAVENOUS
  Filled 2011-05-26 (×3): qty 75

## 2011-05-26 MED ORDER — DIPHENHYDRAMINE HCL 50 MG/ML IJ SOLN
INTRAMUSCULAR | Status: AC
  Filled 2011-05-26: qty 1

## 2011-05-26 MED ORDER — PANTOPRAZOLE SODIUM 40 MG PO TBEC
40.0000 mg | DELAYED_RELEASE_TABLET | Freq: Every day | ORAL | Status: DC
  Administered 2011-05-26 – 2011-05-28 (×3): 40 mg via ORAL
  Filled 2011-05-26 (×4): qty 1

## 2011-05-26 MED ORDER — SIMVASTATIN 20 MG PO TABS
20.0000 mg | ORAL_TABLET | Freq: Every day | ORAL | Status: DC
  Administered 2011-05-26 – 2011-05-27 (×2): 20 mg via ORAL
  Filled 2011-05-26 (×4): qty 1

## 2011-05-26 MED ORDER — DILTIAZEM HCL 90 MG PO TABS
90.0000 mg | ORAL_TABLET | Freq: Every day | ORAL | Status: DC
  Administered 2011-05-27 – 2011-05-28 (×2): 90 mg via ORAL
  Filled 2011-05-26 (×3): qty 1

## 2011-05-26 MED ORDER — GANCICLOVIR SODIUM 500 MG IV SOLR: 75.0000 mg | Freq: Every day | INTRAVENOUS | Status: DC

## 2011-05-26 MED ORDER — MIDAZOLAM HCL 10 MG/2ML IJ SOLN
INTRAMUSCULAR | Status: AC
  Filled 2011-05-26: qty 4

## 2011-05-26 MED ORDER — SODIUM CHLORIDE 0.9 % IV SOLN
Freq: Once | INTRAVENOUS | Status: AC
  Administered 2011-05-26: 500 mL via INTRAVENOUS

## 2011-05-26 MED ORDER — BUSPIRONE HCL 5 MG PO TABS
5.0000 mg | ORAL_TABLET | Freq: Three times a day (TID) | ORAL | Status: DC
  Administered 2011-05-26 – 2011-05-28 (×7): 5 mg via ORAL
  Filled 2011-05-26 (×12): qty 1

## 2011-05-26 MED ORDER — TRAZODONE HCL 50 MG PO TABS
50.0000 mg | ORAL_TABLET | Freq: Every day | ORAL | Status: DC
  Administered 2011-05-26 – 2011-05-27 (×2): 50 mg via ORAL
  Filled 2011-05-26 (×4): qty 1

## 2011-05-26 MED ORDER — BUTAMBEN-TETRACAINE-BENZOCAINE 2-2-14 % EX AERO
INHALATION_SPRAY | CUTANEOUS | Status: DC | PRN
  Administered 2011-05-26: 2 via TOPICAL

## 2011-05-26 MED ORDER — FENTANYL NICU IV SYRINGE 50 MCG/ML
INJECTION | INTRAMUSCULAR | Status: DC | PRN
  Administered 2011-05-26 (×2): 25 ug via INTRAVENOUS

## 2011-05-26 MED ORDER — ONDANSETRON HCL 4 MG PO TABS: 4.0000 mg | ORAL_TABLET | Freq: Four times a day (QID) | ORAL | Status: DC | PRN

## 2011-05-26 MED ORDER — PREDNISONE 10 MG PO TABS
10.0000 mg | ORAL_TABLET | Freq: Every day | ORAL | Status: DC
  Administered 2011-05-26 – 2011-05-28 (×3): 10 mg via ORAL
  Filled 2011-05-26 (×4): qty 1

## 2011-05-26 MED ORDER — LEVOTHYROXINE SODIUM 112 MCG PO TABS
112.0000 ug | ORAL_TABLET | Freq: Every day | ORAL | Status: DC
  Administered 2011-05-26 – 2011-05-28 (×3): 112 ug via ORAL
  Filled 2011-05-26 (×4): qty 1

## 2011-05-26 MED ORDER — DAPSONE 25 MG PO TABS
50.0000 mg | ORAL_TABLET | Freq: Every day | ORAL | Status: DC
  Administered 2011-05-26 – 2011-05-28 (×3): 50 mg via ORAL
  Filled 2011-05-26 (×4): qty 2

## 2011-05-26 MED ORDER — ACETAMINOPHEN 650 MG RE SUPP: 650.0000 mg | Freq: Four times a day (QID) | RECTAL | Status: DC | PRN

## 2011-05-26 MED ORDER — ACETAMINOPHEN 325 MG PO TABS: 650.0000 mg | ORAL_TABLET | Freq: Four times a day (QID) | ORAL | Status: DC | PRN

## 2011-05-26 MED ORDER — FENTANYL CITRATE 0.05 MG/ML IJ SOLN
INTRAMUSCULAR | Status: AC
  Filled 2011-05-26: qty 4

## 2011-05-26 MED ORDER — MIDAZOLAM HCL 10 MG/2ML IJ SOLN
INTRAMUSCULAR | Status: DC | PRN
  Administered 2011-05-26: 2 mg via INTRAVENOUS
  Administered 2011-05-26: 1 mg via INTRAVENOUS
  Administered 2011-05-26: 2 mg via INTRAVENOUS

## 2011-05-26 MED ORDER — METOPROLOL TARTRATE 25 MG PO TABS
25.0000 mg | ORAL_TABLET | Freq: Two times a day (BID) | ORAL | Status: DC
  Administered 2011-05-26 – 2011-05-28 (×4): 25 mg via ORAL
  Filled 2011-05-26 (×7): qty 1

## 2011-05-26 MED ORDER — ONDANSETRON HCL 4 MG/2ML IJ SOLN: 4.0000 mg | Freq: Four times a day (QID) | INTRAMUSCULAR | Status: DC | PRN

## 2011-05-26 NOTE — Op Note (Signed)
[facility] [address] [place], [place]  [address]  ENDOSCOPY PROCEDURE REPORT  PATIENT:  Nancy Blair, Nancy Blair  MR#:  [id number] BIRTHDATE:  [birth year], 75 yrs. old  GENDER:  female ENDOSCOPIST:  Rachael Fee, MD REFERRING:   Dr. [name]; Dr. 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 MEDICATIONS:   Fentanyl 50 mcg IV, Versed 5 mg IV TOPICAL ANESTHETIC:  Cetacaine Spray  DESCRIPTION OF PROCEDURE:   After the risks benefits and alternatives of the procedure were thoroughly explained, informed consent was obtained.  The Pentax Gastroscope [id number] endoscope was introduced through the mouth and advanced to the second portion of the duodenum, without limitations.  The instrument was slowly withdrawn as the mucosa was fully examined. <<PROCEDUREIMAGES>>  Previous fundoplication anatomy, the wrap in normal appearing. (see image5).  There was mild, non-specific gastritis. (see image6).  Otherwise the examination was normal (see image4, image3, and image1).   There was no blood in UGI tract. Retroflexed views revealed no abnormalities.  The scope was then withdrawn from the patient and the procedure completed. COMPLICATIONS:  None  ENDOSCOPIC IMPRESSION: 1) Intact fundopication 2) Mild, non-specific gastritis 3) Otherwise normal examination; no blood in UGI tract  RECOMMENDATIONS: [facility] ospitalist to admit for blood transfusion, clincal observation. She will need follow up appt with Dr. Frederico Hamman early next week at [facility].  ______________________________ Rachael Fee, MD  n. eSIGNED:   Rachael Fee at 05/26/2011 12:27 PM  [place], [place], [id number]

## 2011-05-26 NOTE — Interval H&P Note (Signed)
History and Physical Interval Note:  05/26/2011 11:16 AM  Nancy Blair  has presented today for surgery, with the diagnosis of Melena [578.1] Anemia [285.9]  The various methods of treatment have been discussed with the patient and family. After consideration of risks, benefits and other options for treatment, the patient has consented to  Procedure(s): ESOPHAGOGASTRODUODENOSCOPY (EGD) as a surgical intervention .  The patients' history has been reviewed, patient examined, no change in status, stable for surgery.  I have reviewed the patients' chart and labs.  Questions were answered to the patient's satisfaction.     Rob Bunting

## 2011-05-26 NOTE — Progress Notes (Signed)
Dr  Christella Hartigan said that that was okay to use existing PICC line for endoscopic procedure. [facility] to fax over information concerning placement.

## 2011-05-26 NOTE — Patient Instructions (Signed)
EGD today at [facility] and then admission to hospital for blood transfusion. This plan was discussed with Dr. Frederico Hamman.

## 2011-05-26 NOTE — H&P (Signed)
PCP:   Neena Rhymes, MD, MD   Chief Complaint:  Anemia.  HPI: 76 year old lady with h/o double lung transplant , follows at [facility], was sent in for EGD today for anemia, with a hgb of 7, malena and occasional spitting of blood. She underwent EGD today , was found to be normal without any frank bleeding. We were requested to admit the patient for management of symptomatic anemia with 1 to 2 units of blood transfusions and monitor overnight. She currently denies any complaints. Prior to the procedure she reported dizziness, and malena. She denies any chest pain, sob, palpitations, abd pain nausea or vomiting.   Review of Systems:  The patient denies, fever, weight loss,, vision loss, decreased hearing, hoarseness, chest pain, syncope, peripheral edema, balance deficits, hemoptysis, abdominal pain, hematochezia, severe indigestion/heartburn, hematuria, incontinence, genital sores, muscle weakness, suspicious skin lesions, transient blindness, difficulty walking, depression, unusual weight change, enlarged lymph nodes, angioedema, and breast masses.  Past Medical History: Past Medical History  Diagnosis Date  . Emphysema of lung   . Hypertension   . Hyperlipidemia   . Allergy   . Anemia   . GERD (gastroesophageal reflux disease)    Past Surgical History  Procedure Date  . Appendectomy   . Abdominal hysterectomy   . Tonsillectomy   . Total lung replacement     Medications: Prior to Admission medications   Medication Sig Start Date End Date Taking? Authorizing Provider  aspirin 81 MG tablet Take 160 mg by mouth daily.    Historical Provider, MD  busPIRone (BUSPAR) 5 MG tablet Take 5 mg by mouth 3 (three) times daily.    Historical Provider, MD  Calcium Carbonate-Vit D-Min 600-400 MG-UNIT TABS Take by mouth 2 (two) times daily.      Historical Provider, MD  Calcium Citrate (CITRACAL PO) Take 1 tablet by mouth daily.    Historical Provider, MD  ciprofloxacin (CIPRO) 750 MG tablet Take  750 mg by mouth 2 (two) times daily.    Historical Provider, MD  citalopram (CELEXA) 20 MG tablet TAKE ONE TABLET BY MOUTH EVERY DAY 08/20/10   Damian Leavell., MD  CLINDAMYCIN HCL PO Take by mouth as directed.    Historical Provider, MD  dapsone 25 MG tablet Take 50 mg by mouth daily.     Historical Provider, MD  diltiazem (CARDIZEM) 90 MG tablet TAKE ONE TABLET BY MOUTH EVERY DAY 08/20/10   Danise Edge, MD  GANCICLOVIR SODIUM IV Inject into the vein.    Historical Provider, MD  levothyroxine (SYNTHROID, LEVOTHROID) 112 MCG tablet Take 1 tablet (112 mcg total) by mouth daily. 08/23/10   Damian Leavell., MD  magnesium oxide (MAG-OX) 400 MG tablet Take 400 mg by mouth 2 (two) times daily.    Historical Provider, MD  metoprolol tartrate (LOPRESSOR) 25 MG tablet Take 25 mg by mouth 2 (two) times daily.    Historical Provider, MD  Multiple Vitamins-Minerals (CENTRUM SILVER PO) Take 1 tablet by mouth daily.    Historical Provider, MD  NON FORMULARY Ganciclovir 24 ml Infusion as directed    Historical Provider, MD  omeprazole (PRILOSEC) 20 MG capsule Take 20 mg by mouth daily.    Historical Provider, MD  pravastatin (PRAVACHOL) 20 MG tablet Take 20 mg by mouth daily.    Historical Provider, MD  predniSONE (DELTASONE) 10 MG tablet Take 10 mg by mouth as directed.      Historical Provider, MD  tacrolimus (PROGRAF) 1 MG capsule Take  5 mg by mouth 2 (two) times daily.    Historical Provider, MD  traZODone (DESYREL) 50 MG tablet Take 50 mg by mouth at bedtime.    Historical Provider, MD    Allergies:   Allergies  Allergen Reactions  . Penicillins     REACTION: swelling  . Triple Antibiotic     REACTION: hives,blisters    Social History:  reports that she has quit smoking. She has never used smokeless tobacco. She reports that she does not drink alcohol or use illicit drugs. .  Family History: Family History  Problem Relation Age of Onset  . Heart disease Mother   . Heart  disease Father   . Pancreatic cancer Sister   . Colon cancer Neg Hx   . Malignant hyperthermia Neg Hx     Physical Exam: There were no vitals filed for this visit. Constitutional: Vital signs reviewed.  Patient is a well-developed and well-nourished  in no acute distress and cooperative with exam. Alert and oriented x3.  Head: Normocephalic and atraumatic Mouth: no erythema or exudates, MMM Eyes: PERRL, EOMI, conjunctivae normal, No scleral icterus.  Neck: Supple, Trachea midline normal ROM, No JVD, mass, thyromegaly, or carotid bruit present.  Cardiovascular: RRR, S1 normal, S2 normal, no MRG, pulses symmetric and intact bilaterally Pulmonary/Chest: clear no wheezes, rales, or rhonchi Abdominal: Soft. Non-tender, non-distended, bowel sounds are normal, no masses, organomegaly, or guarding present.  GU: no CVA tenderness Musculoskeletal:left leg wound  Wrapped in bandage. Hematology: no cervical, inginal, or axillary adenopathy.  Neurological: A&O x3, Strenght is normal and symmetric bilaterally, cranial nerve II-XII are grossly intact, no focal motor deficit, sensory intact to light touch bilaterally.  Skin: Warm, dry and intact. No rash, cyanosis, or clubbing.       Labs on Admission:  No results found for this basename: NA:2,K:2,CL:2,CO2:2,GLUCOSE:2,BUN:2,CREATININE:2,CALCIUM:2,MG:2,PHOS:2 in the last 72 hours No results found for this basename: AST:2,ALT:2,ALKPHOS:2,BILITOT:2,PROT:2,ALBUMIN:2 in the last 72 hours No results found for this basename: LIPASE:2,AMYLASE:2 in the last 72 hours No results found for this basename: WBC:2,NEUTROABS:2,HGB:2,HCT:2,MCV:2,PLT:2 in the last 72 hours No results found for this basename: CKTOTAL:3,CKMB:3,CKMBINDEX:3,TROPONINI:3 in the last 72 hours No results found for this basename: TSH,T4TOTAL,FREET3,T3FREE,THYROIDAB in the last 72 hours No results found for this basename: VITAMINB12:2,FOLATE:2,FERRITIN:2,TIBC:2,IRON:2,RETICCTPCT:2 in the last  72 hours  Radiological Exams on Admission: No results found.  Assessment/Plan Present on Admission:  .Anemia: macrocytic. Her baseline hgb is 9. She was found to have hgb of 6.7. 2 units prbc transfusion ordered. We will get a post transfusion H&H.  Her EGD is within normal limits. She underwent a colonoscopy 2 years ago , within normal limits.   S/p lung transplant: on immunosuppressants. Spoke to Dr Edwyna Shell, would resume her home medications including prednisone, ganciclovir pro phylactically for viremia. Would hold prograf tonight for her acute on chronic renal failure and resume once daily dosing in am.   Acute on chronic renal failure: most likley secondary to her medications. Continue to monitor.  Left lower extremity wound with mild cellulitis like changes: stable. No antibiotics needed.  DVT Prophylaxis    Time spent on this patient including examination and decision-making process: 50 minutes.  Rasheena Talmadge [phone] 05/26/2011, 2:52 PM

## 2011-05-26 NOTE — Progress Notes (Signed)
Pt Hgb 6.7  MD notified.

## 2011-05-26 NOTE — Progress Notes (Signed)
Review of pertinent gastrointestinal problems: 1. adenomatous colon polyps, colonoscopy September 2011. Also noted diverticulosis and melanosis. Recall colonoscopy set for 3 year interval.     HPI: This is a   very pleasant 76-year-old woman whom I last saw over a year ago at the time of her routine screening colonoscopy.   CBC recently showed hemoglobin 7.1, MCV 109. One year ago her hemoglobin was 15. In the interim she has had a double lung transplantation and has been on immunosuppressive medicines.  She has been told she was anemic "all her life."  She had a bronchoscopy, done as protocol looking for rejection, no sign of rejection (about a month ago).  She has been coughing up blood.  No vomiting of blood.  She actually had fundoplication as part of hte lung transplant to keep acid from causing.  Had started vancomycin (10 day course, ).  Clindamycin.  Gancyclovir.      She has had black colored stools now for about 2 days.  She agrees that these were dark and tarry.   She is not on iron.  She has not had significant abdominal pains, no nausea and she does not vomit ever.  She has no chest pains, no shortness of breath, she is fatigued but not more than her baseline  She is on prilosec daily.   I do not have any other lab tests available since one year ago. All of her transplant followup has been at [facility], I assume labs have been drawn several times there. They are not available at the time of this visit.  She is on Prograf and dapsone. Dapsone is listed as having serious reactions of hemolytic anemia, aplastic anemia. Prograf also has anemia listed as a potential side effect.  Ganciclovir has a side effect of pancytopenia.  No nsaids.   She had labs drawn at home, YESTERDAY, results sent to [facility], not available currently.  Her creatinine is now 2, her BUN 28. One year ago these were both normal.  We are having a very difficult time getting labs from [facility] to compare her current  abnormalities to.  After several phone calls I was able to speak with someone who ran out of her other recent labs. One week ago her hemoglobin was 8.5 and one month ago her hemoglobin was 9.3. One week ago her creatinine was 1.9 and one month ago her creatinine was 1.8.  Review of systems: Pertinent positive and negative review of systems were noted in the above HPI section. Complete review of systems was performed and was otherwise normal.    Past Medical History  Diagnosis Date  . Emphysema of lung   . Hypertension   . Hyperlipidemia   . Allergy   . Anemia     Past Surgical History  Procedure Date  . Appendectomy   . Abdominal hysterectomy   . Tonsillectomy   . Total lung replacement     Current Outpatient Prescriptions  Medication Sig Dispense Refill  . aspirin 81 MG tablet Take 160 mg by mouth daily.      . busPIRone (BUSPAR) 5 MG tablet Take 5 mg by mouth 3 (three) times daily.      . Calcium Carbonate-Vit D-Min 600-400 MG-UNIT TABS Take by mouth 2 (two) times daily.        . citalopram (CELEXA) 20 MG tablet TAKE ONE TABLET BY MOUTH EVERY DAY  30 tablet  10  . dapsone 25 MG tablet Take 50 mg by mouth daily.       .   diltiazem (CARDIZEM) 90 MG tablet TAKE ONE TABLET BY MOUTH EVERY DAY  30 tablet  3  . levothyroxine (SYNTHROID, LEVOTHROID) 112 MCG tablet Take 1 tablet (112 mcg total) by mouth daily.  30 tablet  11  . magnesium oxide (MAG-OX) 400 MG tablet Take 400 mg by mouth 2 (two) times daily.      . metoprolol tartrate (LOPRESSOR) 25 MG tablet Take 25 mg by mouth 2 (two) times daily.      . NON FORMULARY Oxygen 4L/M Indogen. Use constantly except at bedtime       . predniSONE (DELTASONE) 10 MG tablet Take 10 mg by mouth as directed.        . tacrolimus (PROGRAF) 1 MG capsule Take 5 mg by mouth 2 (two) times daily.      . traZODone (DESYREL) 50 MG tablet Take 50 mg by mouth at bedtime.        Allergies as of 05/26/2011 - Review Complete 05/26/2011  Allergen Reaction  Noted  . Penicillins    . Triple antibiotic      Family History  Problem Relation Age of Onset  . Heart disease Mother   . Heart disease Father   . Pancreatic cancer Sister   . Colon cancer Neg Hx     History   Social History  . Marital Status: Widowed    Spouse Name: N/A    Number of Children: N/A  . Years of Education: N/A   Occupational History  . Not on file.   Social History Main Topics  . Smoking status: Former Smoker  . Smokeless tobacco: Never Used  . Alcohol Use: No  . Drug Use: No  . Sexually Active: Not on file   Other Topics Concern  . Not on file   Social History Narrative  . No narrative on file       Physical Exam: Ht 5' (1.524 m)  Wt 127 lb (57.607 kg)  BMI 24.80 kg/m2 Constitutional: generally well-appearing Psychiatric: alert and oriented x3 Eyes: extraocular movements intact Mouth: oral pharynx moist, no lesions Neck: supple no lymphadenopathy Cardiovascular: heart regular rate and rhythm Lungs: clear to auscultation bilaterally Abdomen: soft, nontender, nondistended, no obvious ascites, no peritoneal signs, normal bowel sounds Extremities: no lower extremity edema bilaterally Skin: Pale    Assessment and plan: 76 y.o. 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 With her recent melena and worsening anemia she may have upper GI bleed. She needs EGD and blood transfusion. She is quite complex medically with double lung transplantation, currently immunosuppressed. She is also on several antibiotics currently for possible rejection or possible infection in her lungs. We will arrange for EGD at [facility] and will plan to admit her to the hospitalist service for blood transfusion, care with her immunosuppression, lung transplantation.    

## 2011-05-26 NOTE — H&P (View-Only) (Signed)
Review of pertinent gastrointestinal problems: 1. adenomatous colon polyps, colonoscopy September 2011. Also noted diverticulosis and melanosis. Recall colonoscopy set for 3 year interval.     HPI: This is a   very pleasant 76 year old woman whom I last saw over a year ago at the time of her routine screening colonoscopy.   CBC recently showed hemoglobin 7.1, MCV 109. One year ago her hemoglobin was 15. In the interim she has had a double lung transplantation and has been on immunosuppressive medicines.  She has been told she was anemic "all her life."  She had a bronchoscopy, done as protocol looking for rejection, no sign of rejection (about a month ago).  She has been coughing up blood.  No vomiting of blood.  She actually had fundoplication as part of hte lung transplant to keep acid from causing.  Had started vancomycin (10 day course, ).  Clindamycin.  Gancyclovir.      She has had black colored stools now for about 2 days.  She agrees that these were dark and tarry.   She is not on iron.  She has not had significant abdominal pains, no nausea and she does not vomit ever.  She has no chest pains, no shortness of breath, she is fatigued but not more than her baseline  She is on prilosec daily.   I do not have any other lab tests available since one year ago. All of her transplant followup has been at [facility], I assume labs have been drawn several times there. They are not available at the time of this visit.  She is on Prograf and dapsone. Dapsone is listed as having serious reactions of hemolytic anemia, aplastic anemia. Prograf also has anemia listed as a potential side effect.  Ganciclovir has a side effect of pancytopenia.  No nsaids.   She had labs drawn at home, [facility], results sent to [facility], not available currently.  Her creatinine is now 2, her BUN 28. One year ago these were both normal.  We are having a very difficult time getting labs from [facility] to compare her current  abnormalities to.  After several phone calls I was able to speak with someone who ran out of her other recent labs. One week ago her hemoglobin was 8.5 and one month ago her hemoglobin was 9.3. One week ago her creatinine was 1.9 and one month ago her creatinine was 1.8.  Review of systems: Pertinent positive and negative review of systems were noted in the above HPI section. Complete review of systems was performed and was otherwise normal.    Past Medical History  Diagnosis Date  . Emphysema of lung   . Hypertension   . Hyperlipidemia   . Allergy   . Anemia     Past Surgical History  Procedure Date  . Appendectomy   . Abdominal hysterectomy   . Tonsillectomy   . Total lung replacement     Current Outpatient Prescriptions  Medication Sig Dispense Refill  . aspirin 81 MG tablet Take 160 mg by mouth daily.      . busPIRone (BUSPAR) 5 MG tablet Take 5 mg by mouth 3 (three) times daily.      . Calcium Carbonate-Vit D-Min 600-400 MG-UNIT TABS Take by mouth 2 (two) times daily.        . citalopram (CELEXA) 20 MG tablet TAKE ONE TABLET BY MOUTH EVERY DAY  30 tablet  10  . dapsone 25 MG tablet Take 50 mg by mouth daily.       Marland Kitchen  diltiazem (CARDIZEM) 90 MG tablet TAKE ONE TABLET BY MOUTH EVERY DAY  30 tablet  3  . levothyroxine (SYNTHROID, LEVOTHROID) 112 MCG tablet Take 1 tablet (112 mcg total) by mouth daily.  30 tablet  11  . magnesium oxide (MAG-OX) 400 MG tablet Take 400 mg by mouth 2 (two) times daily.      . metoprolol tartrate (LOPRESSOR) 25 MG tablet Take 25 mg by mouth 2 (two) times daily.      . NON FORMULARY Oxygen 4L/M Indogen. Use constantly except at bedtime       . predniSONE (DELTASONE) 10 MG tablet Take 10 mg by mouth as directed.        . tacrolimus (PROGRAF) 1 MG capsule Take 5 mg by mouth 2 (two) times daily.      . traZODone (DESYREL) 50 MG tablet Take 50 mg by mouth at bedtime.        Allergies as of 05/26/2011 - Review Complete 05/26/2011  Allergen Reaction  Noted  . Penicillins    . Triple antibiotic      Family History  Problem Relation Age of Onset  . Heart disease Mother   . Heart disease Father   . Pancreatic cancer Sister   . Colon cancer Neg Hx     History   Social History  . Marital Status: Widowed    Spouse Name: N/A    Number of Children: N/A  . Years of Education: N/A   Occupational History  . Not on file.   Social History Main Topics  . Smoking status: Former Games developer  . Smokeless tobacco: Never Used  . Alcohol Use: No  . Drug Use: No  . Sexually Active: Not on file   Other Topics Concern  . Not on file   Social History Narrative  . No narrative on file       Physical Exam: Ht 5' (1.524 m)  Wt 127 lb (57.607 kg)  BMI 24.80 kg/m2 Constitutional: generally well-appearing Psychiatric: alert and oriented x3 Eyes: extraocular movements intact Mouth: oral pharynx moist, no lesions Neck: supple no lymphadenopathy Cardiovascular: heart regular rate and rhythm Lungs: clear to auscultation bilaterally Abdomen: soft, nontender, nondistended, no obvious ascites, no peritoneal signs, normal bowel sounds Extremities: no lower extremity edema bilaterally Skin: Pale    Assessment and plan: 76 y.o. 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 With her recent melena and worsening anemia she may have upper GI bleed. She needs EGD and blood transfusion. She is quite complex medically with double lung transplantation, currently immunosuppressed. She is also on several antibiotics currently for possible rejection or possible infection in her lungs. We will arrange for EGD at [facility] and will plan to admit her to the hospitalist service for blood transfusion, care with her immunosuppression, lung transplantation.

## 2011-05-27 LAB — BASIC METABOLIC PANEL
BUN: 38 mg/dL — ABNORMAL HIGH (ref 6–23)
Calcium: 8.3 mg/dL — ABNORMAL LOW (ref 8.4–10.5)
Creatinine, Ser: 3 mg/dL — ABNORMAL HIGH (ref 0.50–1.10)
GFR calc Af Amer: 17 mL/min — ABNORMAL LOW (ref >90)
GFR calc non Af Amer: 14 mL/min — ABNORMAL LOW (ref >90)
Potassium: 4.8 mEq/L (ref 3.5–5.1)

## 2011-05-27 LAB — GLUCOSE, CAPILLARY: Glucose-Capillary: 106 mg/dL — ABNORMAL HIGH (ref 70–99)

## 2011-05-27 LAB — CBC
HCT: 31.8 % — ABNORMAL LOW (ref 36.0–46.0)
MCHC: 32.7 g/dL (ref 30.0–36.0)
RDW: 18.4 % — ABNORMAL HIGH (ref 11.5–15.5)

## 2011-05-27 MED ORDER — SODIUM CHLORIDE 0.9 % IJ SOLN
10.0000 mL | INTRAMUSCULAR | Status: DC | PRN
  Administered 2011-05-27: 10 mL

## 2011-05-27 MED ORDER — TACROLIMUS 0.5 MG PO CAPS
0.5000 mg | ORAL_CAPSULE | Freq: Every day | ORAL | Status: DC
  Administered 2011-05-27 – 2011-05-28 (×2): 0.5 mg via ORAL
  Filled 2011-05-27 (×3): qty 1

## 2011-05-27 MED ORDER — FUROSEMIDE 10 MG/ML IJ SOLN
20.0000 mg | Freq: Once | INTRAMUSCULAR | Status: AC
  Administered 2011-05-27: 20 mg via INTRAVENOUS
  Filled 2011-05-27: qty 2

## 2011-05-27 MED ORDER — HYDRALAZINE HCL 20 MG/ML IJ SOLN
5.0000 mg | Freq: Once | INTRAMUSCULAR | Status: AC
  Administered 2011-05-27: 5 mg via INTRAVENOUS
  Filled 2011-05-27: qty 1

## 2011-05-27 MED ORDER — INSULIN ASPART 100 UNIT/ML ~~LOC~~ SOLN
0.0000 [IU] | Freq: Three times a day (TID) | SUBCUTANEOUS | Status: DC
  Administered 2011-05-27: 2 [IU] via SUBCUTANEOUS

## 2011-05-27 MED ORDER — INSULIN ASPART 100 UNIT/ML ~~LOC~~ SOLN
0.0000 [IU] | Freq: Every day | SUBCUTANEOUS | Status: DC
  Filled 2011-05-27: qty 3

## 2011-05-27 NOTE — Progress Notes (Signed)
Subjective: COMFORTABLE  Objective: Weight change:   Intake/Output Summary (Last 24 hours) at 05/27/11 1926 Last data filed at 05/27/11 1900  Gross per 24 hour  Intake   1195 ml  Output      0 ml  Net   1195 ml    Filed Vitals:   05/27/11 1300  BP: 111/59  Pulse: 71  Temp: 99 F (37.2 C)  Resp: 20   On exam she is alert has low grade temp CVS S1S2 LUNGS CLEAR ABD SOFT NON TENDER BOWEL SOUNDS HEARD EXT NO PEDAL EDEMA. LEFT LEG WOUND.  Lab Results: Results for orders placed during the hospital encounter of 05/26/11 (from the past 24 hour(s))  CBC     Status: Abnormal   Collection Time   05/27/11  8:20 AM      Component Value Range   WBC 8.8  4.0 - 10.5 (K/uL)   RBC 3.34 (*) 3.87 - 5.11 (MIL/uL)   Hemoglobin 10.4 (*) 12.0 - 15.0 (g/dL)   HCT 16.1 (*) 09.6 - 46.0 (%)   MCV 95.2  78.0 - 100.0 (fL)   MCH 31.1  26.0 - 34.0 (pg)   MCHC 32.7  30.0 - 36.0 (g/dL)   RDW 04.5 (*) 40.9 - 15.5 (%)   Platelets 217  150 - 400 (K/uL)  BASIC METABOLIC PANEL     Status: Abnormal   Collection Time   05/27/11  8:20 AM      Component Value Range   Sodium 138  135 - 145 (mEq/L)   Potassium 4.8  3.5 - 5.1 (mEq/L)   Chloride 102  96 - 112 (mEq/L)   CO2 24  19 - 32 (mEq/L)   Glucose, Bld 158 (*) 70 - 99 (mg/dL)   BUN 38 (*) 6 - 23 (mg/dL)   Creatinine, Ser 8.11 (*) 0.50 - 1.10 (mg/dL)   Calcium 8.3 (*) 8.4 - 10.5 (mg/dL)   GFR calc non Af Amer 14 (*) >90 (mL/min)   GFR calc Af Amer 17 (*) >90 (mL/min)  GLUCOSE, CAPILLARY     Status: Abnormal   Collection Time   05/27/11  5:27 PM      Component Value Range   Glucose-Capillary 170 (*) 70 - 99 (mg/dL)     Micro Results: No results found for this or any previous visit (from the past 240 hour(s)).  Studies/Results: No results found. Medications: Scheduled Meds:   . busPIRone  5 mg Oral TID  . citalopram  10 mg Oral Daily  . dapsone  50 mg Oral Daily  . diltiazem  90 mg Oral Daily  . furosemide  20 mg Intravenous Once  .  ganciclovir (CYTOVENE) IV  75 mg Intravenous Q24H  . hydrALAZINE  5 mg Intravenous Once  . insulin aspart  0-5 Units Subcutaneous QHS  . insulin aspart  0-9 Units Subcutaneous TID WC  . levothyroxine  112 mcg Oral QAC breakfast  . metoprolol tartrate  25 mg Oral BID  . pantoprazole  40 mg Oral Q1200  . predniSONE  10 mg Oral Q breakfast  . simvastatin  20 mg Oral q1800  . tacrolimus  0.5 mg Oral Daily  . traZODone  50 mg Oral QHS   Continuous Infusions:  PRN Meds:.acetaminophen, acetaminophen, ondansetron (ZOFRAN) IV, ondansetron, sodium chloride  Assessment/Plan: Patient Active Hospital Problem List: Anemia (05/26/2011)  post transfusion H&H is 10. Will repeat a cbc in am. No malena or evidence of GI bleeding.   Copd s/p lung transplant: on  prograf and prednisone  Left leg wound: outpatient follow up.  Dm: on ssi   LOS: 1 day   Nancy Blair 05/27/2011, 7:26 PM

## 2011-05-28 LAB — GLUCOSE, CAPILLARY
Glucose-Capillary: 112 mg/dL — ABNORMAL HIGH (ref 70–99)
Glucose-Capillary: 221 mg/dL — ABNORMAL HIGH (ref 70–99)

## 2011-05-28 LAB — CBC
MCH: 31 pg (ref 26.0–34.0)
MCHC: 32.4 g/dL (ref 30.0–36.0)
Platelets: 207 10*3/uL (ref 150–400)
RBC: 3.26 MIL/uL — ABNORMAL LOW (ref 3.87–5.11)
RDW: 17.8 % — ABNORMAL HIGH (ref 11.5–15.5)

## 2011-05-28 LAB — TYPE AND SCREEN
Antibody Screen: NEGATIVE
Unit division: 0

## 2011-05-28 LAB — BASIC METABOLIC PANEL
BUN: 33 mg/dL — ABNORMAL HIGH (ref 6–23)
GFR calc Af Amer: 22 mL/min — ABNORMAL LOW (ref >90)
GFR calc non Af Amer: 19 mL/min — ABNORMAL LOW (ref >90)
Potassium: 4 mEq/L (ref 3.5–5.1)
Sodium: 142 mEq/L (ref 135–145)

## 2011-05-28 MED ORDER — TACROLIMUS 0.5 MG PO CAPS: 0.5000 mg | ORAL_CAPSULE | Freq: Every day | ORAL | Status: DC

## 2011-05-28 MED ORDER — HEPARIN SOD (PORK) LOCK FLUSH 100 UNIT/ML IV SOLN: 250.0000 [IU] | INTRAVENOUS | Status: DC | PRN

## 2011-05-28 NOTE — Progress Notes (Signed)
Spoke with Dr. Blake Divine, pt is going home with PICC to continue Ganciclovir at home. Dr. Blake Divine states she spoke with [facility] doctor (Dr. Glenetta Borg). Pt is to start Ganciclovir 75mg  tomorrow. Dr. Blake Divine States Dr. Glenetta Borg will order change with pt's home infusion company. Pt's husband present for discharge, reviewed medications, he states pt has appt with Dr. Glenetta Borg tomorrow. He will take discharge medication list for review. IV nurse present to flush PICC as Ganciclovir is complete.

## 2011-05-29 ENCOUNTER — Encounter (HOSPITAL_COMMUNITY): Payer: Self-pay | Admitting: Gastroenterology

## 2011-05-29 NOTE — Progress Notes (Signed)
UR completed 

## 2011-05-29 NOTE — Discharge Summary (Addendum)
DISCHARGE SUMMARY  Nancy Blair  MR#: [id number]  DOB:23-Sep-1935  Date of Admission: 05/26/2011 Date of Discharge: 05/29/2011  Attending Physician:Nancy Blair  Patient's NWG:Nancy [name], MD, MD  Consults: -GI consult for EGD.   Discharge Diagnoses: Present on Admission:  .Anemia Lung Transplant COPD Hypertension Hypothyroidism Acute on Chronic Renal Failure      Medication List  As of 05/29/2011  7:55 AM   STOP taking these medications         ciprofloxacin 750 MG tablet      clindamycin 150 MG capsule      magnesium oxide 400 MG tablet         TAKE these medications         aspirin 81 MG chewable tablet   Chew 81 mg by mouth daily.      busPIRone 5 MG tablet   Commonly known as: BUSPAR   Take 5 mg by mouth 3 (three) times daily.      CALCIUM 600 + D PO   Take 1 tablet by mouth 2 (two) times daily.      citalopram 20 MG tablet   Commonly known as: CELEXA   TAKE ONE TABLET BY MOUTH EVERY DAY      CITRACAL PO   Take 1 tablet by mouth daily.      dapsone 25 MG tablet   Take 50 mg by mouth daily.      GANCICLOVIR SODIUM IV   Inject into the vein once.      levothyroxine 112 MCG tablet   Commonly known as: SYNTHROID, LEVOTHROID   Take 1 tablet (112 mcg total) by mouth daily.      metoprolol tartrate 25 MG tablet   Commonly known as: LOPRESSOR   Take 25 mg by mouth 2 (two) times daily.      mulitivitamin with minerals Tabs   Take 1 tablet by mouth daily.      omeprazole 20 MG capsule   Commonly known as: PRILOSEC   Take 20 mg by mouth daily.      pravastatin 20 MG tablet   Commonly known as: PRAVACHOL   Take 20 mg by mouth daily.      predniSONE 10 MG tablet   Commonly known as: DELTASONE   Take 15 mg by mouth daily. Patient intends to taper down to 10mg  daily due to dr's instructions.      tacrolimus 0.5 MG capsule   Commonly known as: PROGRAF   Take 1 capsule (0.5 mg total) by mouth daily.      traZODone 50 MG tablet   Commonly known as: DESYREL   Take 50 mg by mouth at bedtime.          Admit Note  76 year old lady with h/o double lung transplant , follows at [facility], was sent in for EGD today for anemia, with a hgb of 7, malena and occasional spitting of blood. She underwent EGD today , was found to be normal without any frank bleeding. We were requested to admit the patient for management of symptomatic anemia with 1 to 2 units of blood transfusions and monitor overnight.   Hospital Course: Present on Admission:  1. COPD: S/P lung transplant on immunosuppressants, prograf and prednisone. Follow up with Dr Nancy Blair in am.  2. Anemia: probably anemia of chronic disease vs secondary to medications. She had a colonoscopy 2 years which was normal as per the GI, underwent EGD this admission which was essentially normal without any frank bleeding.  She was admitted and got 2 units of PRBC transfusion. Her Hemoglobin improved to 10 and remained at 10.  3. Acute on chronic Renal Failure:  Most likely secondary to medications. Decreased the Ganciclovir to 75mg  daily and decreased the prograf to 0.5mg  daily. Her renal funtion has improved from 3.3 to 2.4 at the time of discharge.  4. Hypertension: controlled. Initially Diltiazem was put on the medication regimen, as it was on their medication list but was later taken off, as her blood pressure dropped.  5. Hypothyroidism: continue with synthroid.     Day of Discharge BP 110/70  Pulse 64  Temp(Src) 98.2 F (36.8 C) (Oral)  Resp 20  Ht 5' (1.524 m)  Wt 56.745 kg (125 lb 1.6 oz)  BMI 24.43 kg/m2  SpO2 92%  Physical Exam: On exam she is alert afebrile and comfortable.  CVS S1S2  LUNGS CLEAR  ABD SOFT NON TENDER BOWEL SOUNDS HEARD  EXT no pedal edema, cyanosis or clubbing and left leg wound wrapped in bandage   Results for orders placed during the hospital encounter of 05/26/11 (from the past 24 hour(s))  BASIC METABOLIC PANEL     Status: Abnormal    Collection Time   05/28/11 11:30 AM      Component Value Range   Sodium 142  135 - 145 (mEq/L)   Potassium 4.0  3.5 - 5.1 (mEq/L)   Chloride 108  96 - 112 (mEq/L)   CO2 24  19 - 32 (mEq/L)   Glucose, Bld 94  70 - 99 (mg/dL)   BUN 33 (*) 6 - 23 (mg/dL)   Creatinine, Ser 0.45 (*) 0.50 - 1.10 (mg/dL)   Calcium 8.4  8.4 - 40.9 (mg/dL)   GFR calc non Af Amer 19 (*) >90 (mL/min)   GFR calc Af Amer 22 (*) >90 (mL/min)  GLUCOSE, CAPILLARY     Status: Normal   Collection Time   05/28/11 11:40 AM      Component Value Range   Glucose-Capillary 93  70 - 99 (mg/dL)  GLUCOSE, CAPILLARY     Status: Abnormal   Collection Time   05/28/11  4:32 PM      Component Value Range   Glucose-Capillary 221 (*) 70 - 99 (mg/dL)    Disposition: Home   Follow-up Appts: Discharge Orders    Future Orders Please Complete By Expires   Diet - Blair sodium heart healthy      Increase activity slowly      Discharge instructions      Comments:   Follow up at [facility] tomorrow.      Follow-up Information    Follow up with Nancy Rhymes, MD. (As needed)    Contact information:   [phone] [place] [address] [phone]          Tests Needing Follow-up: CBC  Time spent in discharge (includes decision making & examination of pt): 45 minutes  Signed: Valine Blair 05/29/2011, 7:55 AM

## 2011-05-30 ENCOUNTER — Encounter: Payer: Medicare Other | Admitting: Family Medicine

## 2011-06-08 ENCOUNTER — Encounter (HOSPITAL_BASED_OUTPATIENT_CLINIC_OR_DEPARTMENT_OTHER): Payer: Medicare Other

## 2011-06-16 DIAGNOSIS — D239 Other benign neoplasm of skin, unspecified: Secondary | ICD-10-CM

## 2011-06-16 HISTORY — DX: Other benign neoplasm of skin, unspecified: D23.9

## 2011-06-19 ENCOUNTER — Telehealth: Payer: Self-pay

## 2011-06-19 NOTE — Telephone Encounter (Signed)
Patient voiced understanding and agreed to call.    KP

## 2011-06-19 NOTE — Telephone Encounter (Signed)
Call from patient and she stated she was put on an increase dose of recently due to the rejection of her lung transplant. Since the medication was increase she has had lightheadedness. (per patient double lung transplant 05/2010) She denied Chest pain. Denied SOB. I have advised patient to call her physician at [facility] and let them know of her symptoms since medication increased, she voiced understanding and agreed to do so, any other suggestion. Please advise    KP

## 2011-06-19 NOTE — Telephone Encounter (Signed)
No further recommendations.

## 2011-07-04 LAB — PULMONARY FUNCTION TEST

## 2011-07-13 ENCOUNTER — Encounter: Payer: Self-pay | Admitting: Family Medicine

## 2011-07-13 ENCOUNTER — Ambulatory Visit (INDEPENDENT_AMBULATORY_CARE_PROVIDER_SITE_OTHER): Payer: Medicare Other | Admitting: Family Medicine

## 2011-07-13 VITALS — BP 120/77 | HR 76 | Temp 97.6°F | Ht 60.0 in | Wt 129.4 lb

## 2011-07-13 DIAGNOSIS — C44721 Squamous cell carcinoma of skin of unspecified lower limb, including hip: Secondary | ICD-10-CM | POA: Insufficient documentation

## 2011-07-13 NOTE — Progress Notes (Signed)
  Subjective:    Patient ID: Nancy Blair, female    DOB: 07/27/1935, 76 y.o.   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  Procedure done 3 weeks ago.  Has follow up w/ them in a 'few weeks'.  Still having 'oozing from wound'.  Some surrounding redness.  Pulmonary MD wanted this looked at.  Some pain.  Reports she had skin grafting done- no notes to review.   Review of Systems For ROS see HPI     Objective:   Physical Exam  Skin: Skin is warm and dry. Lesion (3.5x3 cm area on L lower leg w/ dark outer area (pt reports site of skin grafting) and 2 areas of pink, clean based, wounds w/ new granulation tissue.  + serous drainage.  no surrounding erythema or induration) noted. No rash noted.             Assessment & Plan:

## 2011-07-13 NOTE — Patient Instructions (Signed)
The wound looks good!  It does not look infected If any concerns- please call your surgeon Continue to keep it clean and dry Call if you need [place]! Happy Odis Luster!

## 2011-07-16 NOTE — Assessment & Plan Note (Signed)
New.  Pt's wound appears to be well healing.  No current purulent drainage, surrounding erythema or induration.  No current need for abx.  Reviewed red flags that should prompt immediate return.  Pt has f/u pending w/ derm.  Will assist as able.

## 2011-07-21 LAB — PULMONARY FUNCTION TEST

## 2011-08-01 LAB — PULMONARY FUNCTION TEST

## 2011-08-18 ENCOUNTER — Telehealth: Payer: Self-pay | Admitting: *Deleted

## 2011-08-18 DIAGNOSIS — T86819 Unspecified complication of lung transplant: Secondary | ICD-10-CM

## 2011-08-18 DIAGNOSIS — Z298 Encounter for other specified prophylactic measures: Secondary | ICD-10-CM

## 2011-08-18 NOTE — Telephone Encounter (Signed)
Incoming fax noted to set pt up for pt for weekly blood draws per [facility] MD Zaas  CBC (DX 996.84) BMP & LFT'S (DX 996.84) MAGNESIUM (DX 275.2) CYCLOSPORIN-BY LC/MS (DX V07.2)  Placed standing orders in the chart, called pt to advise the orders have been set up and that she can set up her first lab visit, set up pt first lab visit for 08-21-11 at 8:45am, pt accepted apt.

## 2011-08-21 ENCOUNTER — Other Ambulatory Visit (INDEPENDENT_AMBULATORY_CARE_PROVIDER_SITE_OTHER): Payer: Medicare Other

## 2011-08-21 ENCOUNTER — Telehealth: Payer: Self-pay | Admitting: *Deleted

## 2011-08-21 ENCOUNTER — Ambulatory Visit (INDEPENDENT_AMBULATORY_CARE_PROVIDER_SITE_OTHER): Payer: Medicare Other | Admitting: Family Medicine

## 2011-08-21 ENCOUNTER — Encounter: Payer: Self-pay | Admitting: Family Medicine

## 2011-08-21 VITALS — BP 120/75 | HR 67 | Temp 97.7°F | Ht 60.0 in | Wt 128.0 lb

## 2011-08-21 DIAGNOSIS — R42 Dizziness and giddiness: Secondary | ICD-10-CM

## 2011-08-21 DIAGNOSIS — Z2989 Encounter for other specified prophylactic measures: Secondary | ICD-10-CM

## 2011-08-21 DIAGNOSIS — T86819 Unspecified complication of lung transplant: Secondary | ICD-10-CM

## 2011-08-21 DIAGNOSIS — Z298 Encounter for other specified prophylactic measures: Secondary | ICD-10-CM

## 2011-08-21 LAB — CBC WITH DIFFERENTIAL/PLATELET
Eosinophils Relative: 0 % (ref 0.0–5.0)
HCT: 22.4 % — CL (ref 36.0–46.0)
Hemoglobin: 7.2 g/dL — CL (ref 12.0–15.0)
Lymphs Abs: 1.7 10*3/uL (ref 0.7–4.0)
Monocytes Relative: 4.3 % (ref 3.0–12.0)
Neutro Abs: 19.5 10*3/uL — ABNORMAL HIGH (ref 1.4–7.7)
WBC: 22.2 10*3/uL (ref 4.5–10.5)

## 2011-08-21 LAB — BASIC METABOLIC PANEL
BUN: 44 mg/dL — ABNORMAL HIGH (ref 6–23)
CO2: 24 mEq/L (ref 19–32)
Calcium: 9.6 mg/dL (ref 8.4–10.5)
Creatinine, Ser: 2.7 mg/dL — ABNORMAL HIGH (ref 0.4–1.2)
Glucose, Bld: 159 mg/dL — ABNORMAL HIGH (ref 70–99)

## 2011-08-21 LAB — HEPATIC FUNCTION PANEL
Albumin: 2.8 g/dL — ABNORMAL LOW (ref 3.5–5.2)
Total Protein: 6.2 g/dL (ref 6.0–8.3)

## 2011-08-21 NOTE — Patient Instructions (Signed)
Dr Frederico Hamman is supposed to call me back and let me know if we can stop the Cipro or need to change it to something else [facility] notify you of your lab results Call with any questions or concerns Hang in there!!!

## 2011-08-21 NOTE — Progress Notes (Signed)
Lab only 

## 2011-08-21 NOTE — Telephone Encounter (Addendum)
Critical lab values: 22.2 WBC, 7.2 hemoglobin, 22.4 hematocrit. Labs to be faxed Forward to MD, copy printed and given to MD

## 2011-08-21 NOTE — Progress Notes (Signed)
  Subjective:    Patient ID: Nancy Blair, female    DOB: Sep 05, 1935, 76 y.o.   MRN: [id number]  HPI Dizziness- 'i'm light headed all the time', sxs started last week when she started the 7 day course of Cipro.  Dizziness both at rest and w/ position change.  Was given Cipro by Dr Frederico Hamman for slight lung rejection discovered on bronch/UTI.  Denies vertigo.  Drinking fluids w/out difficulty.  Has hx of similar and that was felt to be due to orthostasis but BP has been better recently.  Pt and husband feel strongly that sxs are due to initiation of Cipro.   Review of Systems For ROS see HPI     Objective:   Physical Exam  Vitals reviewed. Constitutional: She is oriented to person, place, and time. She appears well-developed and well-nourished. No distress.  HENT:  Head: Normocephalic and atraumatic.  Eyes: Conjunctivae and EOM are normal. Pupils are equal, round, and reactive to light.  Neck: Normal range of motion. Neck supple.  Cardiovascular: Normal rate, regular rhythm, normal heart sounds and intact distal pulses.   Pulmonary/Chest: Effort normal and breath sounds normal. No respiratory distress. She has no wheezes. She has no rales.  Abdominal: Soft. Bowel sounds are normal. She exhibits no distension. There is no tenderness. There is no rebound and no guarding.  Musculoskeletal: She exhibits no edema.  Lymphadenopathy:    She has no cervical adenopathy.  Neurological: She is alert and oriented to person, place, and time. No cranial nerve deficit.  Skin: Skin is warm and dry.       Sallow appearing  Psychiatric: She has a normal mood and affect. Her behavior is normal.          Assessment & Plan:

## 2011-08-24 ENCOUNTER — Other Ambulatory Visit (INDEPENDENT_AMBULATORY_CARE_PROVIDER_SITE_OTHER): Payer: Medicare Other

## 2011-08-24 DIAGNOSIS — T86819 Unspecified complication of lung transplant: Secondary | ICD-10-CM

## 2011-08-24 LAB — CBC WITH DIFFERENTIAL/PLATELET
Basophils Relative: 0.3 % (ref 0.0–3.0)
Eosinophils Relative: 0.1 % (ref 0.0–5.0)
MCV: 102.6 fl — ABNORMAL HIGH (ref 78.0–100.0)
Monocytes Absolute: 1.1 10*3/uL — ABNORMAL HIGH (ref 0.1–1.0)
Neutrophils Relative %: 83.7 % — ABNORMAL HIGH (ref 43.0–77.0)
RBC: 2.12 Mil/uL — ABNORMAL LOW (ref 3.87–5.11)
WBC: 18.5 10*3/uL (ref 4.5–10.5)

## 2011-08-24 NOTE — Progress Notes (Signed)
Lab only 

## 2011-09-02 DIAGNOSIS — R42 Dizziness and giddiness: Secondary | ICD-10-CM | POA: Insufficient documentation

## 2011-09-02 NOTE — Assessment & Plan Note (Signed)
New.  Pt reports hx of similar but at that time BP was low- BP normal today.  Pt w/ multiple chronic problems that could be cause- anemia, hypothyroid, anti-rejection meds- and new issues- UTI, abx use, dehydration.  Pt had labs drawn this AM- awaiting results to determine possible causes.  Called [facility] to get verbal OK to d/c cipro- they are in agreement w/ this.  Pt aware.  After labs reviewed- pt found to have very high WBC.  Since pt is immunosuppressed due to rejection meds, called [facility] to determine whether they wanted pt to go to ER here in [place] or go to [place].  Transplant coordinator indicated they would contact pt and address the issue.

## 2011-09-04 ENCOUNTER — Other Ambulatory Visit (INDEPENDENT_AMBULATORY_CARE_PROVIDER_SITE_OTHER): Payer: Medicare Other

## 2011-09-04 DIAGNOSIS — T86819 Unspecified complication of lung transplant: Secondary | ICD-10-CM

## 2011-09-04 DIAGNOSIS — Z298 Encounter for other specified prophylactic measures: Secondary | ICD-10-CM

## 2011-09-04 LAB — HEPATIC FUNCTION PANEL
AST: 23 U/L (ref 0–37)
Albumin: 2.8 g/dL — ABNORMAL LOW (ref 3.5–5.2)
Total Protein: 5.8 g/dL — ABNORMAL LOW (ref 6.0–8.3)

## 2011-09-04 LAB — CBC WITH DIFFERENTIAL/PLATELET
Basophils Relative: 0.5 % (ref 0.0–3.0)
Eosinophils Absolute: 0 10*3/uL (ref 0.0–0.7)
Hemoglobin: 9.3 g/dL — ABNORMAL LOW (ref 12.0–15.0)
Lymphs Abs: 1.4 10*3/uL (ref 0.7–4.0)
MCHC: 32.6 g/dL (ref 30.0–36.0)
MCV: 98.2 fl (ref 78.0–100.0)
Monocytes Absolute: 0.6 10*3/uL (ref 0.1–1.0)
Neutro Abs: 10.6 10*3/uL — ABNORMAL HIGH (ref 1.4–7.7)
RBC: 2.9 Mil/uL — ABNORMAL LOW (ref 3.87–5.11)

## 2011-09-04 LAB — BASIC METABOLIC PANEL
BUN: 29 mg/dL — ABNORMAL HIGH (ref 6–23)
CO2: 22 mEq/L (ref 19–32)
Calcium: 8.2 mg/dL — ABNORMAL LOW (ref 8.4–10.5)
Glucose, Bld: 122 mg/dL — ABNORMAL HIGH (ref 70–99)
Sodium: 138 mEq/L (ref 135–145)

## 2011-09-04 LAB — MAGNESIUM: Magnesium: 1.6 mg/dL (ref 1.5–2.5)

## 2011-09-04 NOTE — Progress Notes (Signed)
LABS ONLY  

## 2011-09-05 LAB — CYCLOSPORINE LEVEL: Cyclosporine, Blood: 787 ng/mL — ABNORMAL HIGH (ref 140–330)

## 2011-09-08 ENCOUNTER — Other Ambulatory Visit (INDEPENDENT_AMBULATORY_CARE_PROVIDER_SITE_OTHER): Payer: Medicare Other

## 2011-09-08 DIAGNOSIS — T86819 Unspecified complication of lung transplant: Secondary | ICD-10-CM

## 2011-09-08 LAB — BASIC METABOLIC PANEL
BUN: 37 mg/dL — ABNORMAL HIGH (ref 6–23)
Calcium: 8.4 mg/dL (ref 8.4–10.5)
GFR: 29.82 mL/min — ABNORMAL LOW (ref >60.00)
Potassium: 3.1 mEq/L — ABNORMAL LOW (ref 3.5–5.1)
Sodium: 142 mEq/L (ref 135–145)

## 2011-09-08 LAB — CBC WITH DIFFERENTIAL/PLATELET
Lymphocytes Relative: 5.2 % — ABNORMAL LOW (ref 12.0–46.0)
MCHC: 31.8 g/dL (ref 30.0–36.0)
Monocytes Absolute: 0 10*3/uL — ABNORMAL LOW (ref 0.1–1.0)
Neutrophils Relative %: 94.6 % — ABNORMAL HIGH (ref 43.0–77.0)
RDW: 21.5 % — ABNORMAL HIGH (ref 11.5–14.6)

## 2011-09-08 LAB — HEPATIC FUNCTION PANEL
AST: 23 U/L (ref 0–37)
Total Bilirubin: 0.6 mg/dL (ref 0.3–1.2)

## 2011-09-08 LAB — MAGNESIUM: Magnesium: 1.8 mg/dL (ref 1.5–2.5)

## 2011-09-08 NOTE — Progress Notes (Signed)
Labs only

## 2011-09-08 NOTE — Progress Notes (Signed)
Addended by: Silvio Pate D on: 09/08/2011 10:06 AM   Modules accepted: Orders

## 2011-09-14 ENCOUNTER — Other Ambulatory Visit (INDEPENDENT_AMBULATORY_CARE_PROVIDER_SITE_OTHER): Payer: Medicare Other

## 2011-09-14 DIAGNOSIS — Z298 Encounter for other specified prophylactic measures: Secondary | ICD-10-CM

## 2011-09-14 DIAGNOSIS — T86819 Unspecified complication of lung transplant: Secondary | ICD-10-CM

## 2011-09-14 LAB — CBC WITH DIFFERENTIAL/PLATELET
Basophils Relative: 0.6 % (ref 0.0–3.0)
Eosinophils Absolute: 0 10*3/uL (ref 0.0–0.7)
Eosinophils Relative: 0.1 % (ref 0.0–5.0)
Hemoglobin: 10.4 g/dL — ABNORMAL LOW (ref 12.0–15.0)
Lymphocytes Relative: 9.5 % — ABNORMAL LOW (ref 12.0–46.0)
MCHC: 33 g/dL (ref 30.0–36.0)
Monocytes Relative: 3.6 % (ref 3.0–12.0)
Neutro Abs: 10.2 10*3/uL — ABNORMAL HIGH (ref 1.4–7.7)
Neutrophils Relative %: 86.2 % — ABNORMAL HIGH (ref 43.0–77.0)
RBC: 3.22 Mil/uL — ABNORMAL LOW (ref 3.87–5.11)
WBC: 11.9 10*3/uL — ABNORMAL HIGH (ref 4.5–10.5)

## 2011-09-14 LAB — HEPATIC FUNCTION PANEL
ALT: 22 U/L (ref 0–35)
AST: 35 U/L (ref 0–37)
Albumin: 3.4 g/dL — ABNORMAL LOW (ref 3.5–5.2)
Total Protein: 6.3 g/dL (ref 6.0–8.3)

## 2011-09-14 LAB — BASIC METABOLIC PANEL
BUN: 68 mg/dL — ABNORMAL HIGH (ref 6–23)
CO2: 25 mEq/L (ref 19–32)
Chloride: 106 mEq/L (ref 96–112)
Creatinine, Ser: 2.3 mg/dL — ABNORMAL HIGH (ref 0.4–1.2)
Glucose, Bld: 178 mg/dL — ABNORMAL HIGH (ref 70–99)
Potassium: 2.6 mEq/L — CL (ref 3.5–5.1)

## 2011-09-14 LAB — MAGNESIUM: Magnesium: 2.3 mg/dL (ref 1.5–2.5)

## 2011-09-14 MED ORDER — POTASSIUM CHLORIDE CRYS ER 20 MEQ PO TBCR: 40.0000 meq | EXTENDED_RELEASE_TABLET | Freq: Two times a day (BID) | ORAL | Status: DC

## 2011-09-14 NOTE — Progress Notes (Signed)
Labs only

## 2011-09-14 NOTE — Progress Notes (Signed)
Addended by: Edwena Felty T on: 09/14/2011 04:24 PM   Modules accepted: Orders

## 2011-09-19 ENCOUNTER — Other Ambulatory Visit (INDEPENDENT_AMBULATORY_CARE_PROVIDER_SITE_OTHER): Payer: Medicare Other

## 2011-09-19 DIAGNOSIS — T86819 Unspecified complication of lung transplant: Secondary | ICD-10-CM

## 2011-09-19 DIAGNOSIS — Z298 Encounter for other specified prophylactic measures: Secondary | ICD-10-CM

## 2011-09-19 LAB — HEPATIC FUNCTION PANEL
Bilirubin, Direct: 0.2 mg/dL (ref 0.0–0.3)
Total Bilirubin: 0.8 mg/dL (ref 0.3–1.2)

## 2011-09-19 LAB — CBC WITH DIFFERENTIAL/PLATELET
Basophils Absolute: 0 10*3/uL (ref 0.0–0.1)
Basophils Relative: 0.2 % (ref 0.0–3.0)
Eosinophils Absolute: 0 10*3/uL (ref 0.0–0.7)
Lymphocytes Relative: 23.2 % (ref 12.0–46.0)
MCHC: 33.3 g/dL (ref 30.0–36.0)
MCV: 98.9 fl (ref 78.0–100.0)
Monocytes Absolute: 0.4 10*3/uL (ref 0.1–1.0)
Neutro Abs: 5.1 10*3/uL (ref 1.4–7.7)
Neutrophils Relative %: 71.3 % (ref 43.0–77.0)
RBC: 3 Mil/uL — ABNORMAL LOW (ref 3.87–5.11)
RDW: 21.4 % — ABNORMAL HIGH (ref 11.5–14.6)

## 2011-09-19 LAB — BASIC METABOLIC PANEL
BUN: 47 mg/dL — ABNORMAL HIGH (ref 6–23)
Chloride: 108 mEq/L (ref 96–112)
Creatinine, Ser: 1.8 mg/dL — ABNORMAL HIGH (ref 0.4–1.2)
GFR: 28.88 mL/min — ABNORMAL LOW (ref >60.00)
Potassium: 5.6 mEq/L — ABNORMAL HIGH (ref 3.5–5.1)

## 2011-09-19 LAB — MAGNESIUM: Magnesium: 2.2 mg/dL (ref 1.5–2.5)

## 2011-09-19 NOTE — Progress Notes (Signed)
Labs only

## 2011-09-20 ENCOUNTER — Other Ambulatory Visit: Payer: Self-pay | Admitting: Family Medicine

## 2011-09-20 LAB — CYCLOSPORINE LEVEL: Cyclosporine, Blood: 131 ng/mL — ABNORMAL LOW (ref 140–330)

## 2011-10-03 ENCOUNTER — Other Ambulatory Visit (INDEPENDENT_AMBULATORY_CARE_PROVIDER_SITE_OTHER): Payer: Medicare Other

## 2011-10-03 ENCOUNTER — Other Ambulatory Visit: Payer: Self-pay | Admitting: *Deleted

## 2011-10-03 DIAGNOSIS — T86819 Unspecified complication of lung transplant: Secondary | ICD-10-CM

## 2011-10-03 DIAGNOSIS — Z298 Encounter for other specified prophylactic measures: Secondary | ICD-10-CM

## 2011-10-03 LAB — CBC WITH DIFFERENTIAL/PLATELET
Basophils Relative: 0.9 % (ref 0.0–3.0)
Eosinophils Relative: 0.4 % (ref 0.0–5.0)
HCT: 29.3 % — ABNORMAL LOW (ref 36.0–46.0)
Hemoglobin: 9.6 g/dL — ABNORMAL LOW (ref 12.0–15.0)
Lymphocytes Relative: 22.6 % (ref 12.0–46.0)
Lymphs Abs: 1.3 10*3/uL (ref 0.7–4.0)
Monocytes Relative: 6.7 % (ref 3.0–12.0)
Neutro Abs: 3.9 10*3/uL (ref 1.4–7.7)
RBC: 2.85 Mil/uL — ABNORMAL LOW (ref 3.87–5.11)

## 2011-10-03 LAB — BASIC METABOLIC PANEL
BUN: 44 mg/dL — ABNORMAL HIGH (ref 6–23)
CO2: 24 mEq/L (ref 19–32)
Chloride: 110 mEq/L (ref 96–112)
Creatinine, Ser: 2 mg/dL — ABNORMAL HIGH (ref 0.4–1.2)
Glucose, Bld: 101 mg/dL — ABNORMAL HIGH (ref 70–99)

## 2011-10-03 LAB — HEPATIC FUNCTION PANEL
ALT: 37 U/L — ABNORMAL HIGH (ref 0–35)
Albumin: 3.1 g/dL — ABNORMAL LOW (ref 3.5–5.2)
Alkaline Phosphatase: 45 U/L (ref 39–117)
Bilirubin, Direct: 0.1 mg/dL (ref 0.0–0.3)
Total Protein: 5.8 g/dL — ABNORMAL LOW (ref 6.0–8.3)

## 2011-10-03 NOTE — Telephone Encounter (Signed)
Called pt to see if the [facility] transplant office has contacted her per noted this nurse left a vm and faxed all labs and concerns to the [facility] transplant office, called mobile and noted as a business and not pt mobile number, called pt home and she advised that [facility] office has not contacted her, let pt know that a vm was left with the [facility] transplant office as well as labs faxed with question concerning the pot, pt understood and will also call our office if/when the [facility] contacts her and we will call her as well if they contact our office, pt understood

## 2011-10-03 NOTE — Progress Notes (Signed)
Labs only

## 2011-10-03 NOTE — Telephone Encounter (Signed)
Will need to review labs first

## 2011-10-03 NOTE — Telephone Encounter (Signed)
Please advise if Pt still needs to take potassium if so Rx needs to be sent in to pharmacy.

## 2011-10-03 NOTE — Telephone Encounter (Signed)
Spoke to pt husband to advise that MD [name] noted for [facility] office to clarify if pt needs to continue taking the potassium, husband advised that MD [facility]bon will no longer be taking care of pt per leaving the [facility] Transplant office, notes MD Thad Ranger in charge of pt care at this point, advised that this nurse will fax pt information to [facility] office to advise about potassium, pt husband understood, faxed all labs available, noted cyclosporine level has not come back at this time per pt labs drawn today, called [facility] office to ask if any information can be given for a direct number to new MD Reynolds for pt per noted past concern with inability to reach MD Albon via answering service noted, contacted MD [facility]bon and was given administrative assistants number per noted should be the same for new MD taking care of pt's care, called number given [phone] left detailed message noting recent labs faxed today with note that pt wants to know if potassium medication still needs to be taken, also gave personal extension to return call to advise new MD contact number and name for verification

## 2011-10-04 NOTE — Telephone Encounter (Signed)
Noted a fax sent today with update of labs per noted Cyclosporine resulted today,noted vm left with [facility] office, still no call back to this office nor pt will call again tommorow

## 2011-10-10 ENCOUNTER — Other Ambulatory Visit (INDEPENDENT_AMBULATORY_CARE_PROVIDER_SITE_OTHER): Payer: Medicare Other

## 2011-10-10 DIAGNOSIS — T86819 Unspecified complication of lung transplant: Secondary | ICD-10-CM

## 2011-10-10 DIAGNOSIS — Z298 Encounter for other specified prophylactic measures: Secondary | ICD-10-CM

## 2011-10-10 LAB — BASIC METABOLIC PANEL
Calcium: 8.4 mg/dL (ref 8.4–10.5)
GFR: 24.21 mL/min — ABNORMAL LOW (ref >60.00)
Glucose, Bld: 89 mg/dL (ref 70–99)
Sodium: 141 mEq/L (ref 135–145)

## 2011-10-10 LAB — CBC WITH DIFFERENTIAL/PLATELET
Basophils Absolute: 0.1 10*3/uL (ref 0.0–0.1)
Eosinophils Absolute: 0 10*3/uL (ref 0.0–0.7)
HCT: 29.4 % — ABNORMAL LOW (ref 36.0–46.0)
Lymphs Abs: 1.3 10*3/uL (ref 0.7–4.0)
MCHC: 32.8 g/dL (ref 30.0–36.0)
MCV: 102.4 fl — ABNORMAL HIGH (ref 78.0–100.0)
Monocytes Absolute: 0.5 10*3/uL (ref 0.1–1.0)
Neutrophils Relative %: 64.3 % (ref 43.0–77.0)
Platelets: 136 10*3/uL — ABNORMAL LOW (ref 150.0–400.0)
RDW: 19.9 % — ABNORMAL HIGH (ref 11.5–14.6)

## 2011-10-10 LAB — HEPATIC FUNCTION PANEL
Albumin: 2.9 g/dL — ABNORMAL LOW (ref 3.5–5.2)
Alkaline Phosphatase: 39 U/L (ref 39–117)
Bilirubin, Direct: 0.1 mg/dL (ref 0.0–0.3)
Total Bilirubin: 0.6 mg/dL (ref 0.3–1.2)

## 2011-10-13 NOTE — Telephone Encounter (Signed)
Pt noted she still has not heard from [facility] office, advised we have not received a call back either, noted a vm left and several faxes sent, pt notes she feels fine and has upcoming apt on July 14th. Will call our office if she needs further assitance

## 2011-10-18 ENCOUNTER — Other Ambulatory Visit (INDEPENDENT_AMBULATORY_CARE_PROVIDER_SITE_OTHER): Payer: Medicare Other

## 2011-10-18 DIAGNOSIS — Z2989 Encounter for other specified prophylactic measures: Secondary | ICD-10-CM

## 2011-10-18 DIAGNOSIS — T86819 Unspecified complication of lung transplant: Secondary | ICD-10-CM

## 2011-10-18 DIAGNOSIS — Z298 Encounter for other specified prophylactic measures: Secondary | ICD-10-CM

## 2011-10-18 LAB — BASIC METABOLIC PANEL
BUN: 47 mg/dL — ABNORMAL HIGH (ref 6–23)
Chloride: 101 mEq/L (ref 96–112)
GFR: 27.98 mL/min — ABNORMAL LOW (ref >60.00)
Glucose, Bld: 109 mg/dL — ABNORMAL HIGH (ref 70–99)
Potassium: 3.1 mEq/L — ABNORMAL LOW (ref 3.5–5.1)

## 2011-10-18 LAB — CBC WITH DIFFERENTIAL/PLATELET
Basophils Relative: 0.3 % (ref 0.0–3.0)
Eosinophils Relative: 0.2 % (ref 0.0–5.0)
Hemoglobin: 9.3 g/dL — ABNORMAL LOW (ref 12.0–15.0)
MCV: 103 fl — ABNORMAL HIGH (ref 78.0–100.0)
Monocytes Absolute: 0.6 10*3/uL (ref 0.1–1.0)
Neutrophils Relative %: 68.6 % (ref 43.0–77.0)
RBC: 2.76 Mil/uL — ABNORMAL LOW (ref 3.87–5.11)
WBC: 5.8 10*3/uL (ref 4.5–10.5)

## 2011-10-18 LAB — HEPATIC FUNCTION PANEL
AST: 29 U/L (ref 0–37)
Albumin: 3 g/dL — ABNORMAL LOW (ref 3.5–5.2)
Alkaline Phosphatase: 51 U/L (ref 39–117)
Bilirubin, Direct: 0 mg/dL (ref 0.0–0.3)

## 2011-11-09 DIAGNOSIS — E2740 Unspecified adrenocortical insufficiency: Secondary | ICD-10-CM | POA: Insufficient documentation

## 2011-11-09 DIAGNOSIS — IMO0002 Reserved for concepts with insufficient information to code with codable children: Secondary | ICD-10-CM | POA: Insufficient documentation

## 2012-01-05 ENCOUNTER — Telehealth: Payer: Self-pay | Admitting: Family Medicine

## 2012-01-05 NOTE — Telephone Encounter (Signed)
patient made appt put them in for 30-minutes and due to her symptoms, wed at 215pm, also note that I transferred her husband to [facility] and he called right back . Thanks If this is not acceptable please let me know

## 2012-01-05 NOTE — Telephone Encounter (Signed)
Caller: Anthony/Spouse; Patient Name: Nancy Blair; PCP: Sheliah Hatch.; Best Callback Phone Number: [phone]; Calling regarding groin area pain that started 01/03/12, fell in the house and think she pulled a muscle. Left leg pain. Has been applying heating pad with some relief, rates pain a 6-7, has took Tylenol with some relief. Afebrile. Emergent signs and symptoms ruled out as per Leg Injury protocol except for see in 4 hours due to moderate-severe pain, able to bear some weight and limits activities. Already has appointment scheduled for 01/10/12 with Dr. [name], advised to proceed to UC and states she will just wait.

## 2012-01-05 NOTE — Telephone Encounter (Signed)
Patient was made aware to go to UC.       KP

## 2012-01-07 NOTE — Telephone Encounter (Signed)
Will see pt when i return on Wednesday- if having worsening sxs or other concerns, needs to go to [facility]

## 2012-01-08 NOTE — Telephone Encounter (Signed)
Left message on voicemail on informing patient or Dr.Tabori's recommendation to go to Urgent Care or the Emergency Room in having worsening symptoms or other concerns, otherwise to be seen on Wed. Patient to call if question or concerns

## 2012-01-10 ENCOUNTER — Ambulatory Visit (INDEPENDENT_AMBULATORY_CARE_PROVIDER_SITE_OTHER)
Admission: RE | Admit: 2012-01-10 | Discharge: 2012-01-10 | Disposition: A | Discharge status: Home / Self Care | Payer: Medicare Other | Source: Ambulatory Visit | Attending: Family Medicine | Admitting: Family Medicine

## 2012-01-10 ENCOUNTER — Encounter: Payer: Self-pay | Admitting: Family Medicine

## 2012-01-10 ENCOUNTER — Ambulatory Visit (INDEPENDENT_AMBULATORY_CARE_PROVIDER_SITE_OTHER): Payer: Medicare Other | Admitting: Family Medicine

## 2012-01-10 VITALS — BP 118/75 | HR 73 | Temp 97.9°F | Ht 60.0 in

## 2012-01-10 DIAGNOSIS — M25559 Pain in unspecified hip: Secondary | ICD-10-CM | POA: Insufficient documentation

## 2012-01-10 MED ORDER — HYDROCODONE-ACETAMINOPHEN 5-325 MG PO TABS: 1.0000 | ORAL_TABLET | Freq: Four times a day (QID) | ORAL | Status: DC | PRN

## 2012-01-10 NOTE — Patient Instructions (Addendum)
Go to [address] for the xray Take the hydrocodone as needed for pain We'll determine the next steps based on xray results Hang in there!!!

## 2012-01-10 NOTE — Progress Notes (Signed)
  Subjective:    Patient ID: Nancy Blair, female    DOB: Sep 12, 1935, 76 y.o.   MRN: [id number]  HPI L leg pain- fell 4-5 days ago, did not hit head, no LOC.  Feels like a 'pulled muscle in groin'.  No radiation of pain.  No pain or difficulty w/ knee or ankle.  No swelling.  Pain improves w/ rest.  Pain worse w/ walking or bearing weight.  Has been minimally active since fall.   Review of Systems For ROS see HPI     Objective:   Physical Exam  Vitals reviewed. Constitutional: She appears well-developed and well-nourished. No distress (not distressed but obviously uncomfortable).  Musculoskeletal:       Left hip: She exhibits decreased range of motion (pain w/ flexion, internal rotation), tenderness (along groin) and bony tenderness. She exhibits no swelling and no deformity.       Legs:         Assessment & Plan:

## 2012-01-11 ENCOUNTER — Telehealth: Payer: Self-pay | Admitting: *Deleted

## 2012-01-11 DIAGNOSIS — S329XXA Fracture of unspecified parts of lumbosacral spine and pelvis, initial encounter for closed fracture: Secondary | ICD-10-CM

## 2012-01-11 NOTE — Telephone Encounter (Signed)
Spoke to pt to advise results/instructions. Pt understood. Placed stat referral in chart for Ortho apt, pt aware someone will call her with apt, updated pt cell number to [phone] per old mobile number belonging to food chain, advised for pt to take pain medication, pt stated "I will"

## 2012-01-11 NOTE — Assessment & Plan Note (Signed)
New.  Given degree of tenderness will get xray to assess for possible hip fracture.  vicodin for pain.  If fracture, will refer to ortho.  Pt expressed understanding and is in agreement w/ plan.

## 2012-01-19 ENCOUNTER — Telehealth: Payer: Self-pay | Admitting: Family Medicine

## 2012-01-19 MED ORDER — HYDROCODONE-ACETAMINOPHEN 5-325 MG PO TABS: 1.0000 | ORAL_TABLET | Freq: Four times a day (QID) | ORAL | Status: DC | PRN

## 2012-01-19 NOTE — Telephone Encounter (Signed)
Ok to refill 

## 2012-01-19 NOTE — Telephone Encounter (Signed)
Ok for #30 

## 2012-01-19 NOTE — Telephone Encounter (Signed)
Refill: Norco 5-325 mg tab. Take one tablet by mouth every 6 hours as needed for pain. Qty 30. Last fill 01-10-12

## 2012-01-19 NOTE — Telephone Encounter (Signed)
Refill phoned in

## 2012-02-06 ENCOUNTER — Emergency Department (HOSPITAL_COMMUNITY): Payer: Medicare Other

## 2012-02-06 ENCOUNTER — Observation Stay (HOSPITAL_COMMUNITY)
Admission: EM | Admit: 2012-02-06 | Discharge: 2012-02-07 | Disposition: A | Discharge status: Home / Self Care | Payer: Medicare Other | Attending: Internal Medicine | Admitting: Internal Medicine

## 2012-02-06 ENCOUNTER — Encounter (HOSPITAL_COMMUNITY): Payer: Self-pay | Admitting: *Deleted

## 2012-02-06 DIAGNOSIS — R9431 Abnormal electrocardiogram [ECG] [EKG]: Secondary | ICD-10-CM

## 2012-02-06 DIAGNOSIS — S32599A Other specified fracture of unspecified pubis, initial encounter for closed fracture: Secondary | ICD-10-CM

## 2012-02-06 DIAGNOSIS — S0083XA Contusion of other part of head, initial encounter: Secondary | ICD-10-CM

## 2012-02-06 DIAGNOSIS — N289 Disorder of kidney and ureter, unspecified: Secondary | ICD-10-CM

## 2012-02-06 DIAGNOSIS — R55 Syncope and collapse: Secondary | ICD-10-CM

## 2012-02-06 DIAGNOSIS — N189 Chronic kidney disease, unspecified: Secondary | ICD-10-CM | POA: Insufficient documentation

## 2012-02-06 DIAGNOSIS — Z79899 Other long term (current) drug therapy: Secondary | ICD-10-CM | POA: Insufficient documentation

## 2012-02-06 DIAGNOSIS — Z23 Encounter for immunization: Secondary | ICD-10-CM | POA: Insufficient documentation

## 2012-02-06 DIAGNOSIS — E876 Hypokalemia: Secondary | ICD-10-CM | POA: Diagnosis present

## 2012-02-06 DIAGNOSIS — E039 Hypothyroidism, unspecified: Secondary | ICD-10-CM | POA: Diagnosis present

## 2012-02-06 DIAGNOSIS — X58XXXA Exposure to other specified factors, initial encounter: Secondary | ICD-10-CM | POA: Insufficient documentation

## 2012-02-06 DIAGNOSIS — J4489 Other specified chronic obstructive pulmonary disease: Secondary | ICD-10-CM | POA: Insufficient documentation

## 2012-02-06 DIAGNOSIS — M47812 Spondylosis without myelopathy or radiculopathy, cervical region: Secondary | ICD-10-CM | POA: Insufficient documentation

## 2012-02-06 DIAGNOSIS — I129 Hypertensive chronic kidney disease with stage 1 through stage 4 chronic kidney disease, or unspecified chronic kidney disease: Secondary | ICD-10-CM | POA: Insufficient documentation

## 2012-02-06 DIAGNOSIS — J449 Chronic obstructive pulmonary disease, unspecified: Secondary | ICD-10-CM | POA: Insufficient documentation

## 2012-02-06 DIAGNOSIS — G319 Degenerative disease of nervous system, unspecified: Secondary | ICD-10-CM | POA: Insufficient documentation

## 2012-02-06 DIAGNOSIS — Z942 Lung transplant status: Secondary | ICD-10-CM

## 2012-02-06 DIAGNOSIS — D649 Anemia, unspecified: Secondary | ICD-10-CM

## 2012-02-06 DIAGNOSIS — Z9181 History of falling: Secondary | ICD-10-CM | POA: Insufficient documentation

## 2012-02-06 DIAGNOSIS — I10 Essential (primary) hypertension: Secondary | ICD-10-CM | POA: Diagnosis present

## 2012-02-06 DIAGNOSIS — W19XXXA Unspecified fall, initial encounter: Secondary | ICD-10-CM

## 2012-02-06 DIAGNOSIS — S022XXA Fracture of nasal bones, initial encounter for closed fracture: Secondary | ICD-10-CM

## 2012-02-06 DIAGNOSIS — S0081XA Abrasion of other part of head, initial encounter: Secondary | ICD-10-CM

## 2012-02-06 DIAGNOSIS — R42 Dizziness and giddiness: Secondary | ICD-10-CM | POA: Diagnosis present

## 2012-02-06 DIAGNOSIS — I951 Orthostatic hypotension: Principal | ICD-10-CM | POA: Diagnosis present

## 2012-02-06 DIAGNOSIS — J9610 Chronic respiratory failure, unspecified whether with hypoxia or hypercapnia: Secondary | ICD-10-CM | POA: Diagnosis present

## 2012-02-06 DIAGNOSIS — N390 Urinary tract infection, site not specified: Secondary | ICD-10-CM

## 2012-02-06 HISTORY — DX: Unspecified adrenocortical insufficiency: E27.40

## 2012-02-06 HISTORY — DX: Hypothyroidism, unspecified: E03.9

## 2012-02-06 HISTORY — DX: Lung transplant status: Z94.2

## 2012-02-06 HISTORY — DX: Other benign neoplasm of skin, unspecified: D23.9

## 2012-02-06 HISTORY — DX: Chronic kidney disease, stage 4 (severe): N18.4

## 2012-02-06 LAB — COMPREHENSIVE METABOLIC PANEL
Albumin: 3.2 g/dL — ABNORMAL LOW (ref 3.5–5.2)
BUN: 53 mg/dL — ABNORMAL HIGH (ref 6–23)
Creatinine, Ser: 1.75 mg/dL — ABNORMAL HIGH (ref 0.50–1.10)
GFR calc Af Amer: 32 mL/min — ABNORMAL LOW (ref >90)
Total Bilirubin: 0.4 mg/dL (ref 0.3–1.2)
Total Protein: 6.5 g/dL (ref 6.0–8.3)

## 2012-02-06 LAB — AMMONIA: Ammonia: 15 umol/L (ref 11–60)

## 2012-02-06 LAB — URINALYSIS, ROUTINE W REFLEX MICROSCOPIC
Ketones, ur: NEGATIVE mg/dL
Protein, ur: 100 mg/dL — AB
Urobilinogen, UA: 0.2 mg/dL (ref 0.0–1.0)

## 2012-02-06 LAB — CBC WITH DIFFERENTIAL/PLATELET
Basophils Relative: 0 % (ref 0–1)
Eosinophils Absolute: 0 10*3/uL (ref 0.0–0.7)
HCT: 31 % — ABNORMAL LOW (ref 36.0–46.0)
Hemoglobin: 10.5 g/dL — ABNORMAL LOW (ref 12.0–15.0)
MCH: 32.3 pg (ref 26.0–34.0)
MCHC: 33.9 g/dL (ref 30.0–36.0)
MCV: 95.4 fL (ref 78.0–100.0)
Monocytes Absolute: 1.3 10*3/uL — ABNORMAL HIGH (ref 0.1–1.0)
Monocytes Relative: 8 % (ref 3–12)

## 2012-02-06 LAB — URINE MICROSCOPIC-ADD ON

## 2012-02-06 LAB — LACTIC ACID, PLASMA: Lactic Acid, Venous: 2.5 mmol/L — ABNORMAL HIGH (ref 0.5–2.2)

## 2012-02-06 MED ORDER — LEVOTHYROXINE SODIUM 112 MCG PO TABS
112.0000 ug | ORAL_TABLET | Freq: Every day | ORAL | Status: DC
  Administered 2012-02-07: 112 ug via ORAL
  Filled 2012-02-06 (×3): qty 1

## 2012-02-06 MED ORDER — BUSPIRONE HCL 5 MG PO TABS
5.0000 mg | ORAL_TABLET | Freq: Three times a day (TID) | ORAL | Status: DC
  Administered 2012-02-06 – 2012-02-07 (×2): 5 mg via ORAL
  Filled 2012-02-06 (×4): qty 1

## 2012-02-06 MED ORDER — DAPSONE 25 MG PO TABS
50.0000 mg | ORAL_TABLET | Freq: Every day | ORAL | Status: DC
  Administered 2012-02-06 – 2012-02-07 (×2): 50 mg via ORAL
  Filled 2012-02-06 (×2): qty 2

## 2012-02-06 MED ORDER — HEPARIN SODIUM (PORCINE) 5000 UNIT/ML IJ SOLN
5000.0000 [IU] | Freq: Three times a day (TID) | INTRAMUSCULAR | Status: DC
  Administered 2012-02-06 – 2012-02-07 (×2): 5000 [IU] via SUBCUTANEOUS
  Filled 2012-02-06 (×5): qty 1

## 2012-02-06 MED ORDER — CYCLOSPORINE MODIFIED (NEORAL) 25 MG PO CAPS
25.0000 mg | ORAL_CAPSULE | Freq: Every day | ORAL | Status: DC
  Administered 2012-02-07: 25 mg via ORAL
  Filled 2012-02-06: qty 1

## 2012-02-06 MED ORDER — TRAZODONE HCL 50 MG PO TABS
50.0000 mg | ORAL_TABLET | Freq: Every day | ORAL | Status: DC
  Administered 2012-02-06: 50 mg via ORAL
  Filled 2012-02-06 (×2): qty 1

## 2012-02-06 MED ORDER — POTASSIUM CHLORIDE CRYS ER 20 MEQ PO TBCR
40.0000 meq | EXTENDED_RELEASE_TABLET | Freq: Two times a day (BID) | ORAL | Status: DC
  Filled 2012-02-06: qty 2

## 2012-02-06 MED ORDER — ONDANSETRON HCL 4 MG/2ML IJ SOLN: 4.0000 mg | Freq: Three times a day (TID) | INTRAMUSCULAR | Status: AC | PRN

## 2012-02-06 MED ORDER — ASPIRIN 81 MG PO CHEW
81.0000 mg | CHEWABLE_TABLET | Freq: Every day | ORAL | Status: DC
  Administered 2012-02-07: 81 mg via ORAL
  Filled 2012-02-06: qty 1

## 2012-02-06 MED ORDER — PANTOPRAZOLE SODIUM 40 MG PO TBEC
40.0000 mg | DELAYED_RELEASE_TABLET | Freq: Every day | ORAL | Status: DC
  Administered 2012-02-07: 40 mg via ORAL
  Filled 2012-02-06: qty 1

## 2012-02-06 MED ORDER — PREDNISONE 5 MG PO TABS
15.0000 mg | ORAL_TABLET | Freq: Every day | ORAL | Status: DC
  Administered 2012-02-07: 15 mg via ORAL
  Filled 2012-02-06 (×3): qty 1

## 2012-02-06 MED ORDER — SODIUM CHLORIDE 0.9 % IJ SOLN
3.0000 mL | Freq: Two times a day (BID) | INTRAMUSCULAR | Status: DC
  Administered 2012-02-06 – 2012-02-07 (×2): 3 mL via INTRAVENOUS

## 2012-02-06 MED ORDER — CIPROFLOXACIN IN D5W 400 MG/200ML IV SOLN: 400.0000 mg | Freq: Once | INTRAVENOUS | Status: DC

## 2012-02-06 MED ORDER — POTASSIUM CHLORIDE 20 MEQ/15ML (10%) PO LIQD
40.0000 meq | Freq: Once | ORAL | Status: AC
  Administered 2012-02-06: 40 meq via ORAL
  Filled 2012-02-06: qty 30

## 2012-02-06 MED ORDER — TETANUS-DIPHTHERIA TOXOIDS TD 5-2 LFU IM INJ
0.5000 mL | INJECTION | Freq: Once | INTRAMUSCULAR | Status: AC
  Administered 2012-02-06: 0.5 mL via INTRAMUSCULAR
  Filled 2012-02-06: qty 0.5

## 2012-02-06 MED ORDER — TACROLIMUS 0.5 MG PO CAPS
0.5000 mg | ORAL_CAPSULE | Freq: Every day | ORAL | Status: DC
  Administered 2012-02-07: 0.5 mg via ORAL
  Filled 2012-02-06 (×2): qty 1

## 2012-02-06 MED ORDER — VALGANCICLOVIR HCL 450 MG PO TABS
450.0000 mg | ORAL_TABLET | ORAL | Status: DC
  Administered 2012-02-07: 450 mg via ORAL
  Filled 2012-02-06: qty 1

## 2012-02-06 MED ORDER — ADULT MULTIVITAMIN W/MINERALS CH
1.0000 | ORAL_TABLET | Freq: Every day | ORAL | Status: DC
  Administered 2012-02-07: 1 via ORAL
  Filled 2012-02-06: qty 1

## 2012-02-06 MED ORDER — HYDRALAZINE HCL 10 MG PO TABS
10.0000 mg | ORAL_TABLET | Freq: Three times a day (TID) | ORAL | Status: DC
  Administered 2012-02-06 – 2012-02-07 (×3): 10 mg via ORAL
  Filled 2012-02-06 (×5): qty 1

## 2012-02-06 MED ORDER — POTASSIUM CHLORIDE CRYS ER 20 MEQ PO TBCR
40.0000 meq | EXTENDED_RELEASE_TABLET | Freq: Two times a day (BID) | ORAL | Status: DC
  Administered 2012-02-06 – 2012-02-07 (×2): 40 meq via ORAL
  Filled 2012-02-06 (×3): qty 2

## 2012-02-06 MED ORDER — SODIUM CHLORIDE 0.9 % IV SOLN
INTRAVENOUS | Status: AC
  Administered 2012-02-06: 23:00:00 via INTRAVENOUS

## 2012-02-06 NOTE — H&P (Signed)
**Note Nancy Blair** [facility] History and Physical  DERISHA FUNDERBURKE ZOX:[id number] DOB: [birth year] DOA: 02/06/2012  Referring physician: Clarene Duke, EDP PCP: Neena Rhymes, MD  Specialists: None  Chief Complaint: Falls and weakness  HPI: Nancy Blair is a 76 y.o. female with known h/o severe COPDEmphysema, S/p BOLT @ [facility] 05/2010 followed by Dr. Mervin Hack, h/o recent multiple falls-Patient states that she fell this morning as well as twice today.  SHe states that she became light headed Francis Dowse thinks its from medicine]-was recently placed on steroids last week after bronchoscopy and thinks that the steroids might have caused this.  She has a h/o of falling and has fallen 3-4 weeks ago and cracked her pelvic bone and was supposed to have rehab and missed the appt and when she went back to [employer] and just recnetly, about 10/16 was using the wheelchair.  SHe then staretd to use a walker 10/17.  She sustained 3 falls in the rpast week. The last time she had fallen before yesterday was  She describes the lightheadedness as happenening after she sits up and stands and lasts for a few minutes.  This happens every once in a while.  She is on blood pressure emdicines which wer echanged and then this stopped.  Was recently (a couple of months ago) changed on her BP meds   On review of her blood pressure indications it looks like she is on Coreg as well as metoprolol as well as amlodipine as well as diltiazem, all of which have AV nodal properties and propensity to cause hypotension.  Review of Systems: .  Has been no nausea no vomiting no shortness of breath no fever no chills no weakness on any one sided body no blurred vision or double vision no abdominal pain no nausea no vomiting no headache no sore throat  Past Medical History  Diagnosis Date  . Emphysema of lung   . Hypertension   . Hyperlipidemia   . Allergy   . Anemia   . GERD (gastroesophageal reflux disease)   . Adrenal insufficiency    . Squamous acanthoma of skin 06/2011    rt leg  . Hypothyroidism   . CKD (chronic kidney disease) stage 4, GFR 15-29 ml/min   . Lung transplant status, bilateral    Past Surgical History  Procedure Date  . Appendectomy   . Abdominal hysterectomy   . Tonsillectomy   . Total lung replacement   . Esophagogastroduodenoscopy 05/26/2011    Procedure: ESOPHAGOGASTRODUODENOSCOPY (EGD);  Surgeon: Rob Bunting, MD;  Location: [facility];  Service: Endoscopy;  Laterality: N/A;   Social History:  reports that she has quit smoking. She started smoking about 59 years ago. She has never used smokeless tobacco. She reports that she does not drink alcohol or use illicit drugs.   Allergies  Allergen Reactions  . Neomycin-Bacitracin Zn-Polymyx     REACTION: hives,blisters  . Penicillins     REACTION: swelling  . Septra (Sulfamethoxazole-Tmp Ds)     unknown    Family History  Problem Relation Age of Onset  . Heart disease Mother   . Heart disease Father   . Pancreatic cancer Sister   . Colon cancer Neg Hx   . Malignant hyperthermia Neg Hx   . Hypertension Mother   . Hypertension Father     Prior to Admission medications   Medication Sig Start Date End Date Taking? Authorizing Provider  aspirin 81 MG chewable tablet Chew 81 mg by mouth daily.   Yes Historical  Provider, MD  Calcium Carbonate-Vitamin D (CALCIUM 600 + D PO) Take 1 tablet by mouth 2 (two) times daily.   Yes Historical Provider, MD  amLODipine (NORVASC) 2.5 MG tablet  12/21/11   Historical Provider, MD  busPIRone (BUSPAR) 5 MG tablet Take 5 mg by mouth 3 (three) times daily.    Historical Provider, MD  Calcium Citrate (CITRACAL PO) Take 1 tablet by mouth daily.    Historical Provider, MD  carvedilol (COREG) 12.5 MG tablet  07/04/11   Historical Provider, MD  ciprofloxacin (CIPRO) 500 MG tablet  08/17/11   Historical Provider, MD  citalopram (CELEXA) 20 MG tablet TAKE ONE TABLET BY MOUTH EVERY DAY 09/20/11   Sheliah Hatch, MD    clindamycin (CLEOCIN) 150 MG capsule  05/18/11   Historical Provider, MD  cycloSPORINE modified (NEORAL/GENGRAF) 25 MG capsule  08/15/11   Historical Provider, MD  dapsone 25 MG tablet Take 50 mg by mouth daily.     Historical Provider, MD  diltiazem (CARDIZEM CD) 120 MG 24 hr capsule  11/02/11   Historical Provider, MD  furosemide (LASIX) 20 MG tablet  01/04/12   Historical Provider, MD  GANCICLOVIR SODIUM IV Inject into the vein once.    Historical Provider, MD  HYDROcodone-acetaminophen (NORCO/VICODIN) 5-325 MG per tablet Take 1 tablet by mouth every 6 (six) hours as needed for pain. 01/19/12   Sheliah Hatch, MD  hydrocortisone (CORTEF) 20 MG tablet  10/27/11   Historical Provider, MD  levothyroxine (SYNTHROID, LEVOTHROID) 112 MCG tablet Take 1 tablet (112 mcg total) by mouth daily. 08/23/10   Damian Leavell., MD  metoprolol tartrate (LOPRESSOR) 25 MG tablet Take 25 mg by mouth 2 (two) times daily.    Historical Provider, MD  Multiple Vitamin (MULITIVITAMIN WITH MINERALS) TABS Take 1 tablet by mouth daily.    Historical Provider, MD  mycophenolate (CELLCEPT) 500 MG tablet  12/21/11   Historical Provider, MD  omeprazole (PRILOSEC) 20 MG capsule Take 20 mg by mouth daily.    Historical Provider, MD  potassium chloride SA (K-DUR,KLOR-CON) 20 MEQ tablet Take 2 tablets (40 mEq total) by mouth 2 (two) times daily. 09/14/11 09/13/12  Sheliah Hatch, MD  pravastatin (PRAVACHOL) 20 MG tablet Take 20 mg by mouth daily.    Historical Provider, MD  predniSONE (DELTASONE) 10 MG tablet Take 15 mg by mouth daily. Patient intends to taper down to 10mg  daily due to dr's instructions.    Historical Provider, MD  sulfamethoxazole-trimethoprim (BACTRIM DS) 800-160 MG per tablet  05/16/11   Historical Provider, MD  tacrolimus (PROGRAF) 0.5 MG capsule Take 1 capsule (0.5 mg total) by mouth daily. 05/28/11 05/27/12  Kathlen Mody, MD  traZODone (DESYREL) 50 MG tablet Take 50 mg by mouth at bedtime.    Historical  Provider, MD  valGANciclovir (VALCYTE) 450 MG tablet Take 450 mg by mouth every other day.    Historical Provider, MD  vancomycin [facility]) 10 G SOLR  05/18/11   Historical Provider, MD   Physical Exam: Filed Vitals:   02/06/12 1124 02/06/12 1311 02/06/12 1322 02/06/12 1323  BP: 173/82 222/106 135/70 117/63  Pulse: 62 68 68 71  Temp: 98.2 F (36.8 C)     TempSrc: Oral     Resp: 26     SpO2: 94%        General:  Alert pleasant oriented Caucasian female-multiple bruises over her face over 4 had bruise over I. on the left  Eyes: Icterus or pallor  ENT:  Seemingly deformed nose however nontender non-painful to touch  Neck: Supple no carotid bruit  Cardiovascular: S1-S2 no murmur rub or  Respiratory: Medically clear no added to  Abdomen: Soft nontender  Skin: Multiple bruises in various stages of  Musculoskeletal: Range of motion intact  Psychiatric: euThymic  Neurologic: Grossly intact gross motor noted  Labs on Admission:  Basic Metabolic Panel:  Lab 02/06/12 1610  NA 137  K 2.6*  CL 95*  CO2 29  GLUCOSE 197*  BUN 53*  CREATININE 1.75*  CALCIUM 9.1  MG --  PHOS --   Liver Function Tests:  Lab 02/06/12 1252  AST 16  ALT 20  ALKPHOS 86  BILITOT 0.4  PROT 6.5  ALBUMIN 3.2*   No results found for this basename: LIPASE:5,AMYLASE:5 in the last 168 hours  Lab 02/06/12 1230  AMMONIA 15   CBC:  Lab 02/06/12 1252  WBC 17.6*  NEUTROABS 15.1*  HGB 10.5*  HCT 31.0*  MCV 95.4  PLT 265   Cardiac Enzymes:  Lab 02/06/12 1251  CKTOTAL --  CKMB --  CKMBINDEX --  TROPONINI <0.30    BNP (last 3 results) No results found for this basename: PROBNP:3 in the last 8760 hours CBG: No results found for this basename: GLUCAP:5 in the last 168 hours  Radiological Exams on Admission: Dg Chest 2 View  02/06/2012  *RADIOLOGY REPORT*  Clinical Data: Cough, history of lung transplant.  Emphysema.  CHEST - 2 VIEW  Comparison:  10/21/2009.  Findings: Previous  median sternotomy.  Multiple clips in the mediastinum.  The heart, mediastinal and hilar contours are normal. The lungs are clear.  There are no pneumothoraces.  There is blunting of the left costophrenic angle on the frontal view only. This is likely due to scarring.  No definite effusions.  No acute bony changes.  IMPRESSION: No active disease.   Original Report Authenticated By: Mervin Hack, M.D.    Dg Pelvis 1-2 Views  02/06/2012  *RADIOLOGY REPORT*  Clinical Data: Recent falls.  PELVIS - 1-2 VIEW  Comparison: None.  Findings: There is a recent fracture of the left pubic arch. There is a suggestion of some periosteal reaction around both components of the fractures suggesting that this is not acute.  There is no additional evidence of acute skeletal trauma.  The sacrum and sacroiliac joints are normal.  Mild degenerative changes in both hip joints are observed.  The proximal femora show no acute findings.  IMPRESSION: Recent fracture of the left pubic arch.   Original Report Authenticated By: Mervin Hack, M.D.    Ct Head Wo Contrast  02/06/2012  *RADIOLOGY REPORT*  Clinical Data:  Fall  CT HEAD WITHOUT CONTRAST CT MAXILLOFACIAL WITHOUT CONTRAST CT CERVICAL SPINE WITHOUT CONTRAST  Technique:  Multidetector CT imaging of the head, cervical spine, and maxillofacial structures were performed using the standard protocol without intravenous contrast. Multiplanar CT image reconstructions of the cervical spine and maxillofacial structures were also generated.  Comparison:   None  CT HEAD  Findings: Fracture of the nasal bone.  No skull fracture.  Negative for intracranial hemorrhage.  No acute infarct or mass.  There is atrophy and mild chronic microvascular ischemia in the white matter.  IMPRESSION: Atrophy and chronic microvascular ischemia.  No acute intracranial abnormality.  Comminuted nasal bone fracture  CT MAXILLOFACIAL  Findings:  Comminuted nasal bone fracture with some displaced  fragments.  The nasal septum is deviated significantly to the left and may be fractured anteriorly.  Septal deviation may have been preexisting to the nasal bone fracture.  Negative for orbital fracture.  Negative for fracture of the mandible or zygomatic arch.  There is moderate to advanced degenerative change in the right temporal mandibular joint.  There is mucosal thickening in the paranasal sinuses including the right frontal sinus and ethmoid sinuses.  This may be due to preexisting sinusitis.  No air-fluid level. Soft tissue swelling in the forehead compatible with contusion.  IMPRESSION: Comminuted and displaced fracture of the nasal bone bilaterally. Nasal septum may be fractured anteriorly and is significantly deviated to the left.  No other fractures.  CT CERVICAL SPINE  Findings:   Negative for cervical spine fracture.  Disc degeneration and spondylosis is present.  There is prominent facet degeneration bilaterally.  Mild posterior slip C3-4 likely related to facet and disc degeneration.  No acute bony change. Carotid calcification is present.  IMPRESSION: Moderately severe spondylosis and facet degeneration.  Negative for fracture.   Original Report Authenticated By: Camelia Phenes, M.D.    Ct Cervical Spine Wo Contrast  02/06/2012  *RADIOLOGY REPORT*  Clinical Data:  Fall  CT HEAD WITHOUT CONTRAST CT MAXILLOFACIAL WITHOUT CONTRAST CT CERVICAL SPINE WITHOUT CONTRAST  Technique:  Multidetector CT imaging of the head, cervical spine, and maxillofacial structures were performed using the standard protocol without intravenous contrast. Multiplanar CT image reconstructions of the cervical spine and maxillofacial structures were also generated.  Comparison:   None  CT HEAD  Findings: Fracture of the nasal bone.  No skull fracture.  Negative for intracranial hemorrhage.  No acute infarct or mass.  There is atrophy and mild chronic microvascular ischemia in the white matter.  IMPRESSION: Atrophy and chronic  microvascular ischemia.  No acute intracranial abnormality.  Comminuted nasal bone fracture  CT MAXILLOFACIAL  Findings:  Comminuted nasal bone fracture with some displaced fragments.  The nasal septum is deviated significantly to the left and may be fractured anteriorly.  Septal deviation may have been preexisting to the nasal bone fracture.  Negative for orbital fracture.  Negative for fracture of the mandible or zygomatic arch.  There is moderate to advanced degenerative change in the right temporal mandibular joint.  There is mucosal thickening in the paranasal sinuses including the right frontal sinus and ethmoid sinuses.  This may be due to preexisting sinusitis.  No air-fluid level. Soft tissue swelling in the forehead compatible with contusion.  IMPRESSION: Comminuted and displaced fracture of the nasal bone bilaterally. Nasal septum may be fractured anteriorly and is significantly deviated to the left.  No other fractures.  CT CERVICAL SPINE  Findings:   Negative for cervical spine fracture.  Disc degeneration and spondylosis is present.  There is prominent facet degeneration bilaterally.  Mild posterior slip C3-4 likely related to facet and disc degeneration.  No acute bony change. Carotid calcification is present.  IMPRESSION: Moderately severe spondylosis and facet degeneration.  Negative for fracture.   Original Report Authenticated By: Camelia Phenes, M.D.    Ct Maxillofacial Wo Cm  02/06/2012  *RADIOLOGY REPORT*  Clinical Data:  Fall  CT HEAD WITHOUT CONTRAST CT MAXILLOFACIAL WITHOUT CONTRAST CT CERVICAL SPINE WITHOUT CONTRAST  Technique:  Multidetector CT imaging of the head, cervical spine, and maxillofacial structures were performed using the standard protocol without intravenous contrast. Multiplanar CT image reconstructions of the cervical spine and maxillofacial structures were also generated.  Comparison:   None  CT HEAD  Findings: Fracture of the nasal bone.  No skull fracture.  Negative  for intracranial hemorrhage.  No acute infarct or mass.  There is atrophy and mild chronic microvascular ischemia in the white matter.  IMPRESSION: Atrophy and chronic microvascular ischemia.  No acute intracranial abnormality.  Comminuted nasal bone fracture  CT MAXILLOFACIAL  Findings:  Comminuted nasal bone fracture with some displaced fragments.  The nasal septum is deviated significantly to the left and may be fractured anteriorly.  Septal deviation may have been preexisting to the nasal bone fracture.  Negative for orbital fracture.  Negative for fracture of the mandible or zygomatic arch.  There is moderate to advanced degenerative change in the right temporal mandibular joint.  There is mucosal thickening in the paranasal sinuses including the right frontal sinus and ethmoid sinuses.  This may be due to preexisting sinusitis.  No air-fluid level. Soft tissue swelling in the forehead compatible with contusion.  IMPRESSION: Comminuted and displaced fracture of the nasal bone bilaterally. Nasal septum may be fractured anteriorly and is significantly deviated to the left.  No other fractures.  CT CERVICAL SPINE  Findings:   Negative for cervical spine fracture.  Disc degeneration and spondylosis is present.  There is prominent facet degeneration bilaterally.  Mild posterior slip C3-4 likely related to facet and disc degeneration.  No acute bony change. Carotid calcification is present.  IMPRESSION: Moderately severe spondylosis and facet degeneration.  Negative for fracture.   Original Report Authenticated By: Camelia Phenes, M.D.     EKG: Independently reviewed. Normal sinus rhythm  Assessment/Plan Principal Problem:  *Syncopal episodes Active Problems:  HYPOTHYROIDISM  HYPOKALEMIA  HYPERTENSION  Lung transplant status, bilateral  Dizziness   1. Syncopy-this very much like orthostatic hypotension. This is iatrogenic and secondary to multiple blood pressure medications including to be blockers,  calcium channel blocker as well as non-dihydropyridine verapamil. I will discontinue all these medications this patient on hydralazine 3 times a day for blood pressure above 160/100. She will need close followup with her outpatient physician for the same. We will keep her on telemetry she'll ensure that there is no sinus arrhythmia or any specific AV nodal issue. She's already had CT scan of the head neck which does not show anything new in terms of fractures. She states that she fell and had a fracture of the nasal bone probably a year ago. 2. Lung transplant-potentially will need pulmonologist help with regards to medications-she has a list of medications that she does regularly take and this may need to be discussed with [facility] with regards to which ones she is currently active on. 3. Medical noncompliance-patient does not take her medications on time and it is noted that she is given her medications by her husband, who also does not seem to be very conversant her health care-most of the history had to be reaffirmed with the daughter. He she will probably need a home health RN PT and OT to work with her 4. Hypokalemia-replace orally 5. Stage III CKD-outpatient nephrology input needed-this is actually better than it was prior 6. Old pubic ramus fracture-this is stable at present time and does not need further workup-see above patient will need PT OT consult in the morning 7. End-stage COPD-per pulmonology  Code Status: DO NOT RESUSCITATE (must indicate code status--if unknown or must be presumed, indicate so) Family Communication: Discussion undertaken at bedside with family (indicate person spoken with, if applicable, with phone number if by telephone) Disposition Plan: Admit for telemetry Obs. Anticipate d/c in 1-2 dayst (indicate anticipated LOS)  Time spent: 45 min  Mahala Menghini [facility] Pager [phone]  If 7PM-7AM, please contact  night-coverage [email or web address] Password [email or web address] 02/06/2012, 2:52 PM

## 2012-02-06 NOTE — ED Notes (Signed)
Patient ambulated to and from the restroom. Patients' gait was unsteady and had to stop halfway back to the room. Patients states " I feel tired after walking."

## 2012-02-06 NOTE — ED Notes (Signed)
Pt in from home by ems. Pt reports falling secondary to near syncopal episode today. Has been feeling dizzy related to new medication, Depo-medrol. Has fallen twice in last 24 hrs. Skin tears to bil arms and forehead from fall last night. No deficits or deformities noted. No new pain complaints from most recent fall today. Fell 3 weeks ago and is still having some pelvic pain related to that fall.

## 2012-02-06 NOTE — ED Notes (Signed)
Pt returned from CT °

## 2012-02-06 NOTE — ED Notes (Signed)
ZOX:[id number]<AV> Expected date:<BR> Expected time:<BR> Means of arrival:Ambulance<BR> Comments:<BR> fall

## 2012-02-06 NOTE — ED Provider Notes (Signed)
History     CSN: [id number]  Arrival date & time 02/06/12  1109   First MD Initiated Contact with Patient 02/06/12 1130      Chief Complaint  Patient presents with  . Fall     HPI Pt was seen at 1200.  Per pt, husband and daughter, pt c/o gradual onset and worsening of persistent "frequent falls" over the past month, worse over the past several days. Pt has fallen 3 times since last night.  Pt states she feels "lightheaded" before she falls. Pt was seen by her PMD 3 weeks ago s/p fall, dx fractures of left pubic rami.  Pt denies CP/palpitations, no SOB/cough, no fevers, no syncope, no N/V/D, no abd pain, no focal motor weakness, no tingling/numbness in extremities.    Past Medical History  Diagnosis Date  . Emphysema of lung   . Hypertension   . Hyperlipidemia   . Allergy   . Anemia   . GERD (gastroesophageal reflux disease)   . Adrenal insufficiency   . Squamous acanthoma of skin 06/2011    rt leg  . Hypothyroidism   . CKD (chronic kidney disease) stage 4, GFR 15-29 ml/min   . Lung transplant status, bilateral     Past Surgical History  Procedure Date  . Appendectomy   . Abdominal hysterectomy   . Tonsillectomy   . Total lung replacement   . Esophagogastroduodenoscopy 05/26/2011    Procedure: ESOPHAGOGASTRODUODENOSCOPY (EGD);  Surgeon: Rob Bunting, MD;  Location: [facility];  Service: Endoscopy;  Laterality: N/A;    Family History  Problem Relation Age of Onset  . Heart disease Mother   . Heart disease Father   . Pancreatic cancer Sister   . Colon cancer Neg Hx   . Malignant hyperthermia Neg Hx     History  Substance Use Topics  . Smoking status: Former Games developer  . Smokeless tobacco: Never Used  . Alcohol Use: No    Review of Systems ROS: Statement: All systems negative except as marked or noted in the HPI; Constitutional: +generalized weakness. Negative for fever and chills. ; ; Eyes: Negative for eye pain, redness and discharge. ; ; ENMT: Negative for  ear pain, hoarseness, nasal congestion, sinus pressure and sore throat. ; ; Cardiovascular: Negative for chest pain, palpitations, diaphoresis, dyspnea and peripheral edema. ; ; Respiratory: Negative for cough, wheezing and stridor. ; ; Gastrointestinal: Negative for nausea, vomiting, diarrhea, abdominal pain, blood in stool, hematemesis, jaundice and rectal bleeding. . ; ; Genitourinary: Negative for dysuria, flank pain and hematuria. ; ; Musculoskeletal: Negative for back pain and neck pain. Negative for swelling and trauma.; ; Skin: +abrasions, ecchymosis. Negative for pruritus, rash, blisters, and skin lesion.; ; Neuro: +lightheadedness. Negative for headache and neck stiffness. Negative for altered level of consciousness , altered mental status, extremity weakness, paresthesias, involuntary movement, seizure and syncope.       Allergies  Neomycin-bacitracin zn-polymyx; Penicillins; and Septra  Home Medications   Current Outpatient Rx  Name Route Sig Dispense Refill  . ASPIRIN 81 MG PO CHEW Oral Chew 81 mg by mouth daily.    Marland Kitchen CALCIUM 600 + D PO Oral Take 1 tablet by mouth 2 (two) times daily.    Marland Kitchen AMLODIPINE BESYLATE 2.5 MG PO TABS      . BUSPIRONE HCL 5 MG PO TABS Oral Take 5 mg by mouth 3 (three) times daily.    Marland Kitchen CITRACAL PO Oral Take 1 tablet by mouth daily.    Marland Kitchen CARVEDILOL 12.5  MG PO TABS      . CIPROFLOXACIN HCL 500 MG PO TABS      . CITALOPRAM HYDROBROMIDE 20 MG PO TABS  TAKE ONE TABLET BY MOUTH EVERY DAY 30 tablet 6  . CLINDAMYCIN HCL 150 MG PO CAPS      . CYCLOSPORINE MODIFIED (NEORAL) 25 MG PO CAPS      . DAPSONE 25 MG PO TABS Oral Take 50 mg by mouth daily.     Marland Kitchen DILTIAZEM HCL ER COATED BEADS 120 MG PO CP24      . FUROSEMIDE 20 MG PO TABS      . GANCICLOVIR SODIUM IV Intravenous Inject into the vein once.    Marland Kitchen HYDROCODONE-ACETAMINOPHEN 5-325 MG PO TABS Oral Take 1 tablet by mouth every 6 (six) hours as needed for pain. 30 tablet 0  . HYDROCORTISONE 20 MG PO TABS      .  LEVOTHYROXINE SODIUM 112 MCG PO TABS Oral Take 1 tablet (112 mcg total) by mouth daily. 30 tablet 11  . METOPROLOL TARTRATE 25 MG PO TABS Oral Take 25 mg by mouth 2 (two) times daily.    . ADULT MULTIVITAMIN W/MINERALS CH Oral Take 1 tablet by mouth daily.    Marland Kitchen MYCOPHENOLATE MOFETIL 500 MG PO TABS      . OMEPRAZOLE 20 MG PO CPDR Oral Take 20 mg by mouth daily.    Marland Kitchen POTASSIUM CHLORIDE CRYS ER 20 MEQ PO TBCR Oral Take 2 tablets (40 mEq total) by mouth 2 (two) times daily. 120 tablet 0  . PRAVASTATIN SODIUM 20 MG PO TABS Oral Take 20 mg by mouth daily.    Marland Kitchen PREDNISONE 10 MG PO TABS Oral Take 15 mg by mouth daily. Patient intends to taper down to 10mg  daily due to dr's instructions.    . SULFAMETHOXAZOLE-TMP DS 800-160 MG PO TABS      . TACROLIMUS 0.5 MG PO CAPS Oral Take 1 capsule (0.5 mg total) by mouth daily. 7 capsule 0  . TRAZODONE HCL 50 MG PO TABS Oral Take 50 mg by mouth at bedtime.    Marland Kitchen VALGANCICLOVIR HCL 450 MG PO TABS Oral Take 450 mg by mouth every other day.    Marland Kitchen VANCOMYCIN HCL 10 G IV SOLR        BP 173/82  Pulse 62  Temp 98.2 F (36.8 C) (Oral)  Resp 26  SpO2 94%  Physical Exam 1205: Physical examination: Vital signs and O2 SAT: Reviewed; Constitutional: Well developed, Well nourished, Well hydrated, In no acute distress; Head and Face: Normocephalic, No scalp hematomas. Multiple skin tears, abrasions, and ecchymosis to forehead.  +mild edema and ecchymosis to left medial periorbital area. Non-tender to palp superior and inferior orbital rim areas.  No zygoma tenderness.  No mandibular tenderness.; Eyes: EOMI, PERRL, No scleral icterus. No obvious hyphema or hypopyon.; ENMT: Mouth and pharynx normal, Mucous membranes moist, +teeth and tongue intact.  No intraoral or intranasal bleeding.  No septal hematomas.  No trismus, no malocclusion.; Neck: Supple, Full range of motion, No lymphadenopathy; Spine: No midline CS, TS, LS tenderness.; Cardiovascular: Regular rate and rhythm, No  gallop; Respiratory: Breath sounds clear & equal bilaterally, No wheezes, Normal respiratory effort/excursion; Chest: Nontender, No deformity, Movement normal, No crepitus; Abdomen: Soft, Nontender, Nondistended, Normal bowel sounds; Genitourinary: No CVA tenderness;; Extremities: No deformity, Full range of motion major/large joints of bilat UE's and LE's without pain or tenderness to palp, Neurovascularly intact, Pulses normal, No tenderness, No edema, Pelvis stable. +multiple  ecchymosis and skin tears in various stages of healing to face, bilat UE's and LE's; Neuro: AA&Ox3, vague historian. Major CN grossly intact. Speech clear. No facial droop. No gross focal motor or sensory deficits in extremities.; Skin: Color normal, Warm, Dry   ED Course  Procedures    MDM  MDM Reviewed: nursing note, vitals and previous chart Reviewed previous: labs and ECG Interpretation: labs, ECG, x-ray and CT scan      Date: 02/06/2012  Rate: 65  Rhythm: normal sinus rhythm  QRS Axis: normal  Intervals: normal  ST/T Wave abnormalities: nonspecific T wave changes  Conduction Disutrbances:none  Narrative Interpretation:  LVH  Old EKG Reviewed: changes noted; NS T-wave changes new since previous EKG dated 06/27/1996.  Results for orders placed during the hospital encounter of 02/06/12  CBC WITH DIFFERENTIAL      Component Value Range   WBC 17.6 (*) 4.0 - 10.5 K/uL   RBC 3.25 (*) 3.87 - 5.11 MIL/uL   Hemoglobin 10.5 (*) 12.0 - 15.0 g/dL   HCT 16.1 (*) 09.6 - 04.5 %   MCV 95.4  78.0 - 100.0 fL   MCH 32.3  26.0 - 34.0 pg   MCHC 33.9  30.0 - 36.0 g/dL   RDW 40.9  81.1 - 91.4 %   Platelets 265  150 - 400 K/uL   Neutrophils Relative 86 (*) 43 - 77 %   Neutro Abs 15.1 (*) 1.7 - 7.7 K/uL   Lymphocytes Relative 6 (*) 12 - 46 %   Lymphs Abs 1.1  0.7 - 4.0 K/uL   Monocytes Relative 8  3 - 12 %   Monocytes Absolute 1.3 (*) 0.1 - 1.0 K/uL   Eosinophils Relative 0  0 - 5 %   Eosinophils Absolute 0.0  0.0 - 0.7  K/uL   Basophils Relative 0  0 - 1 %   Basophils Absolute 0.0  0.0 - 0.1 K/uL  COMPREHENSIVE METABOLIC PANEL      Component Value Range   Sodium 137  135 - 145 mEq/L   Potassium 2.6 (*) 3.5 - 5.1 mEq/L   Chloride 95 (*) 96 - 112 mEq/L   CO2 29  19 - 32 mEq/L   Glucose, Bld 197 (*) 70 - 99 mg/dL   BUN 53 (*) 6 - 23 mg/dL   Creatinine, Ser 7.82 (*) 0.50 - 1.10 mg/dL   Calcium 9.1  8.4 - 95.6 mg/dL   Total Protein 6.5  6.0 - 8.3 g/dL   Albumin 3.2 (*) 3.5 - 5.2 g/dL   AST 16  0 - 37 U/L   ALT 20  0 - 35 U/L   Alkaline Phosphatase 86  39 - 117 U/L   Total Bilirubin 0.4  0.3 - 1.2 mg/dL   GFR calc non Af Amer 28 (*) >90 mL/min   GFR calc Af Amer 32 (*) >90 mL/min  AMMONIA      Component Value Range   Ammonia 15  11 - 60 umol/L  LACTIC ACID, PLASMA      Component Value Range   Lactic Acid, Venous 2.5 (*) 0.5 - 2.2 mmol/L  TROPONIN I      Component Value Range   Troponin I <0.30  <0.30 ng/mL  URINALYSIS, ROUTINE W REFLEX MICROSCOPIC      Component Value Range   Color, Urine YELLOW  YELLOW   APPearance CLOUDY (*) CLEAR   Specific Gravity, Urine 1.017  1.005 - 1.030   pH 6.5  5.0 -  8.0   Glucose, UA 500 (*) NEGATIVE mg/dL   Hgb urine dipstick TRACE (*) NEGATIVE   Bilirubin Urine NEGATIVE  NEGATIVE   Ketones, ur NEGATIVE  NEGATIVE mg/dL   Protein, ur 454 (*) NEGATIVE mg/dL   Urobilinogen, UA 0.2  0.0 - 1.0 mg/dL   Nitrite NEGATIVE  NEGATIVE   Leukocytes, UA SMALL (*) NEGATIVE  URINE MICROSCOPIC-ADD ON      Component Value Range   Squamous Epithelial / LPF FEW (*) RARE   WBC, UA 11-20  <3 WBC/hpf   RBC / HPF 0-2  <3 RBC/hpf   Bacteria, UA MANY (*) RARE   Dg Chest 2 View 02/06/2012  *RADIOLOGY REPORT*  Clinical Data: Cough, history of lung transplant.  Emphysema.  CHEST - 2 VIEW  Comparison:  10/21/2009.  Findings: Previous median sternotomy.  Multiple clips in the mediastinum.  The heart, mediastinal and hilar contours are normal. The lungs are clear.  There are no  pneumothoraces.  There is blunting of the left costophrenic angle on the frontal view only. This is likely due to scarring.  No definite effusions.  No acute bony changes.  IMPRESSION: No active disease.   Original Report Authenticated By: Mervin Hack, M.D.    Dg Pelvis 1-2 Views 02/06/2012  *RADIOLOGY REPORT*  Clinical Data: Recent falls.  PELVIS - 1-2 VIEW  Comparison: None.  Findings: There is a recent fracture of the left pubic arch. There is a suggestion of some periosteal reaction around both components of the fractures suggesting that this is not acute.  There is no additional evidence of acute skeletal trauma.  The sacrum and sacroiliac joints are normal.  Mild degenerative changes in both hip joints are observed.  The proximal femora show no acute findings.  IMPRESSION: Recent fracture of the left pubic arch.   Original Report Authenticated By: Mervin Hack, M.D.    Dg Hip Complete Left 01/10/2012  *RADIOLOGY REPORT*  Clinical Data: Fall with left hip pain.  LEFT HIP - COMPLETE 2+ VIEW  Comparison: None.  Findings: Medial joint space narrowing bilaterally.  Subchondral sclerosis and cyst formation as well as collar osteophytosis bilaterally.  Findings are rather symmetric.  There is a nondisplaced fracture of the left inferior pubic ramus.  Possible associated nondisplaced fracture of the left superior pubic ramus, suggested on the AP view of the pelvis only.  IMPRESSION:  1.  Nondisplaced left inferior pubic ramus fracture.  Difficult to exclude a subtle nondisplaced left superior pubic ramus fracture. 2.  Mild to moderate bilateral hip osteoarthritis.   Original Report Authenticated By: Reyes Ivan, M.D.    Ct Head Wo Contrast 02/06/2012  *RADIOLOGY REPORT*  Clinical Data:  Fall  CT HEAD WITHOUT CONTRAST CT MAXILLOFACIAL WITHOUT CONTRAST CT CERVICAL SPINE WITHOUT CONTRAST  Technique:  Multidetector CT imaging of the head, cervical spine, and maxillofacial structures were  performed using the standard protocol without intravenous contrast. Multiplanar CT image reconstructions of the cervical spine and maxillofacial structures were also generated.  Comparison:   None  CT HEAD  Findings: Fracture of the nasal bone.  No skull fracture.  Negative for intracranial hemorrhage.  No acute infarct or mass.  There is atrophy and mild chronic microvascular ischemia in the white matter.  IMPRESSION: Atrophy and chronic microvascular ischemia.  No acute intracranial abnormality.  Comminuted nasal bone fracture  CT MAXILLOFACIAL  Findings:  Comminuted nasal bone fracture with some displaced fragments.  The nasal septum is deviated significantly to the left  and may be fractured anteriorly.  Septal deviation may have been preexisting to the nasal bone fracture.  Negative for orbital fracture.  Negative for fracture of the mandible or zygomatic arch.  There is moderate to advanced degenerative change in the right temporal mandibular joint.  There is mucosal thickening in the paranasal sinuses including the right frontal sinus and ethmoid sinuses.  This may be due to preexisting sinusitis.  No air-fluid level. Soft tissue swelling in the forehead compatible with contusion.  IMPRESSION: Comminuted and displaced fracture of the nasal bone bilaterally. Nasal septum may be fractured anteriorly and is significantly deviated to the left.  No other fractures.  CT CERVICAL SPINE  Findings:   Negative for cervical spine fracture.  Disc degeneration and spondylosis is present.  There is prominent facet degeneration bilaterally.  Mild posterior slip C3-4 likely related to facet and disc degeneration.  No acute bony change. Carotid calcification is present.  IMPRESSION: Moderately severe spondylosis and facet degeneration.  Negative for fracture.   Original Report Authenticated By: Camelia Phenes, M.D.    Ct Cervical Spine Wo Contrast 02/06/2012  *RADIOLOGY REPORT*  Clinical Data:  Fall  CT HEAD WITHOUT  CONTRAST CT MAXILLOFACIAL WITHOUT CONTRAST CT CERVICAL SPINE WITHOUT CONTRAST  Technique:  Multidetector CT imaging of the head, cervical spine, and maxillofacial structures were performed using the standard protocol without intravenous contrast. Multiplanar CT image reconstructions of the cervical spine and maxillofacial structures were also generated.  Comparison:   None  CT HEAD  Findings: Fracture of the nasal bone.  No skull fracture.  Negative for intracranial hemorrhage.  No acute infarct or mass.  There is atrophy and mild chronic microvascular ischemia in the white matter.  IMPRESSION: Atrophy and chronic microvascular ischemia.  No acute intracranial abnormality.  Comminuted nasal bone fracture  CT MAXILLOFACIAL  Findings:  Comminuted nasal bone fracture with some displaced fragments.  The nasal septum is deviated significantly to the left and may be fractured anteriorly.  Septal deviation may have been preexisting to the nasal bone fracture.  Negative for orbital fracture.  Negative for fracture of the mandible or zygomatic arch.  There is moderate to advanced degenerative change in the right temporal mandibular joint.  There is mucosal thickening in the paranasal sinuses including the right frontal sinus and ethmoid sinuses.  This may be due to preexisting sinusitis.  No air-fluid level. Soft tissue swelling in the forehead compatible with contusion.  IMPRESSION: Comminuted and displaced fracture of the nasal bone bilaterally. Nasal septum may be fractured anteriorly and is significantly deviated to the left.  No other fractures.  CT CERVICAL SPINE  Findings:   Negative for cervical spine fracture.  Disc degeneration and spondylosis is present.  There is prominent facet degeneration bilaterally.  Mild posterior slip C3-4 likely related to facet and disc degeneration.  No acute bony change. Carotid calcification is present.  IMPRESSION: Moderately severe spondylosis and facet degeneration.  Negative for  fracture.   Original Report Authenticated By: Camelia Phenes, M.D.    Ct Maxillofacial Wo Cm 02/06/2012  *RADIOLOGY REPORT*  Clinical Data:  Fall  CT HEAD WITHOUT CONTRAST CT MAXILLOFACIAL WITHOUT CONTRAST CT CERVICAL SPINE WITHOUT CONTRAST  Technique:  Multidetector CT imaging of the head, cervical spine, and maxillofacial structures were performed using the standard protocol without intravenous contrast. Multiplanar CT image reconstructions of the cervical spine and maxillofacial structures were also generated.  Comparison:   None  CT HEAD  Findings: Fracture of the nasal bone.  No skull fracture.  Negative for intracranial hemorrhage.  No acute infarct or mass.  There is atrophy and mild chronic microvascular ischemia in the white matter.  IMPRESSION: Atrophy and chronic microvascular ischemia.  No acute intracranial abnormality.  Comminuted nasal bone fracture  CT MAXILLOFACIAL  Findings:  Comminuted nasal bone fracture with some displaced fragments.  The nasal septum is deviated significantly to the left and may be fractured anteriorly.  Septal deviation may have been preexisting to the nasal bone fracture.  Negative for orbital fracture.  Negative for fracture of the mandible or zygomatic arch.  There is moderate to advanced degenerative change in the right temporal mandibular joint.  There is mucosal thickening in the paranasal sinuses including the right frontal sinus and ethmoid sinuses.  This may be due to preexisting sinusitis.  No air-fluid level. Soft tissue swelling in the forehead compatible with contusion.  IMPRESSION: Comminuted and displaced fracture of the nasal bone bilaterally. Nasal septum may be fractured anteriorly and is significantly deviated to the left.  No other fractures.  CT CERVICAL SPINE  Findings:   Negative for cervical spine fracture.  Disc degeneration and spondylosis is present.  There is prominent facet degeneration bilaterally.  Mild posterior slip C3-4 likely related to  facet and disc degeneration.  No acute bony change. Carotid calcification is present.  IMPRESSION: Moderately severe spondylosis and facet degeneration.  Negative for fracture.   Original Report Authenticated By: Camelia Phenes, M.D.     Results for Nancy Blair, FULGHAM (MRN [id number]) as of 02/06/2012 15:48  Ref. Range 09/14/2011 10:00 09/19/2011 09:51 10/03/2011 09:53 10/10/2011 10:37 10/18/2011 10:23 02/06/2012 12:52  BUN Latest Range: 6-23 mg/dL 68 (H) 47 (H) 44 (H) 44 (H) 47 (H) 53 (H)  Creatinine Latest Range: 0.50-1.10 mg/dL 2.3 (H) 1.8 (H) 2.0 (H) 2.1 (H) 1.9 (H) 1.75 (H)    Results for Nancy Blair, PETERKIN (MRN [id number]) as of 02/06/2012 15:48  Ref. Range 09/14/2011 10:00 09/19/2011 09:51 10/03/2011 09:53 10/10/2011 10:37 10/18/2011 10:23 02/06/2012 12:52  Hemoglobin Latest Range: 12.0-15.0 g/dL 41.3 (L) 9.9 (L) 9.6 (L) 9.6 (L) 9.3 (L) 10.5 (L)  HCT Latest Range: 36.0-46.0 % 31.4 (L) 29.7 (L) 29.3 (L) 29.4 (L) 28.4 (L) 31.0 (L)     1450:  Pt is orthostatic.  Walked a short distance with unsteady gait.  Potassium repleted PO.  WBC count elevated, likely due to prednisone; possible UTI (UC is pending). H/H and BUN/Cr near pt's baseline.  Dx and testing d/w pt and family.  Questions answered.  Verb understanding, agreeable to admit.  T/C to [facility] Dr. Marion Downer, case discussed, including:  HPI, pertinent PM/SHx, VS/PE, dx testing, ED course and treatment:  Agreeable to admit, requests to write temporary orders, obtain tele bed to team 5.         Laray Anger, DO 02/06/12 2016

## 2012-02-07 DIAGNOSIS — N390 Urinary tract infection, site not specified: Secondary | ICD-10-CM

## 2012-02-07 DIAGNOSIS — Z942 Lung transplant status: Secondary | ICD-10-CM

## 2012-02-07 DIAGNOSIS — I951 Orthostatic hypotension: Secondary | ICD-10-CM | POA: Diagnosis present

## 2012-02-07 LAB — CBC
Hemoglobin: 9.6 g/dL — ABNORMAL LOW (ref 12.0–15.0)
RBC: 2.99 MIL/uL — ABNORMAL LOW (ref 3.87–5.11)

## 2012-02-07 LAB — COMPREHENSIVE METABOLIC PANEL
ALT: 15 U/L (ref 0–35)
AST: 17 U/L (ref 0–37)
Alkaline Phosphatase: 67 U/L (ref 39–117)
CO2: 27 mEq/L (ref 19–32)
Calcium: 8.1 mg/dL — ABNORMAL LOW (ref 8.4–10.5)
GFR calc non Af Amer: 30 mL/min — ABNORMAL LOW (ref >90)
Potassium: 4.3 mEq/L (ref 3.5–5.1)
Sodium: 137 mEq/L (ref 135–145)

## 2012-02-07 MED ORDER — T.E.D. KNEE LENGTH/L-REGULAR MISC: Status: DC

## 2012-02-07 MED ORDER — CIPROFLOXACIN HCL 250 MG PO TABS: 250.0000 mg | ORAL_TABLET | Freq: Two times a day (BID) | ORAL | Status: DC

## 2012-02-07 MED ORDER — CIPROFLOXACIN HCL 250 MG PO TABS
250.0000 mg | ORAL_TABLET | Freq: Two times a day (BID) | ORAL | Status: DC
  Administered 2012-02-07: 250 mg via ORAL
  Filled 2012-02-07 (×3): qty 1

## 2012-02-07 MED ORDER — CARVEDILOL 6.25 MG PO TABS: 6.2500 mg | ORAL_TABLET | Freq: Two times a day (BID) | ORAL | Status: DC

## 2012-02-07 MED ORDER — TRAZODONE HCL 50 MG PO TABS: 100.0000 mg | ORAL_TABLET | Freq: Every day | ORAL | Status: DC

## 2012-02-07 NOTE — Discharge Summary (Signed)
Physician Discharge Summary  Patient ID: Nancy Blair MRN: [id number] DOB/AGE: 74/74/1937 74 y.o.  Admit date: 02/06/2012 Discharge date: 02/07/2012  PCP: Neena Rhymes, MD  DISCHARGE DIAGNOSES:  Principal Problem:  *Orthostatic hypotension Active Problems:  HYPOTHYROIDISM  HYPOKALEMIA  HYPERTENSION  Lung transplant status, bilateral  Dizziness  Syncopal episodes   RECOMMENDATIONS TO PCP: 1. May need further adjustment to medications  DISCHARGE CONDITION: fair  INITIAL HISTORY: Nancy Blair is a 76 y.o. female with known h/o severe COPDEmphysema, S/p lung transplant @ [facility] 05/2010 followed by Dr. Mervin Hack, h/o recent multiple falls. Patient stated that she fell twice on the morning of admission. She stated that she became light headed Francis Dowse thinks its from medicine]-was recently placed on steroids last week after bronchoscopy and thinks that the steroids might have caused this. She has a h/o of falling and has fallen 3-4 weeks ago and cracked her pelvic bone and was supposed to have rehab and missed the appt and when she went back to [employer] and just recnetly, about 10/16 was using the wheelchair. She then staretd to use a walker 10/17. She sustained 3 falls in the past week. She describes the lightheadedness as happening after she sits up and stands and lasts for a few minutes. This happens every once in a while. She is on blood pressure medicines which were changed and then this stopped.   HOSPITAL COURSE:  Syncope secondary to Orthostatic hypotension Other work up has been unremarkable. UTI was detected. But her symptoms are suggestive of orthostases. Adjustments have been made to her BP medications. She has been prescribed TED stocking sand instructed to get up slowly from lying position.  Physical therapy has sen the patient and they recommend home health which will be arranged.   UTI Cultures are pending. Will initiate Cipro.  History of Lung  transplant This issue is stable. Can continue with her immunosuppressants. Can follow up with her providers in [place].   Medical noncompliance Apparently patient does not take her medications on time and it is noted that she is given her medications by her husband, who also does not seem to be very conversant her health care-most of the history had to be reaffirmed with the daughter. Home health Rn will be arranged as well.   Stage III CKD Management per PCP.  Old pubic ramus fracture This is stable at present time and does not need further workup. Home health arranged.  Nasal Fractures These are old as well. Patient has seen ENT in the past for the same.  End-stage COPD Per pulmonology as OP  Overall patient has improved. She has ambulated with PT. She is keen on going home. She is considered safe for discharge with home health.    IMAGING STUDIES Dg Chest 2 View  02/06/2012  *RADIOLOGY REPORT*  Clinical Data: Cough, history of lung transplant.  Emphysema.  CHEST - 2 VIEW  Comparison:  10/21/2009.  Findings: Previous median sternotomy.  Multiple clips in the mediastinum.  The heart, mediastinal and hilar contours are normal. The lungs are clear.  There are no pneumothoraces.  There is blunting of the left costophrenic angle on the frontal view only. This is likely due to scarring.  No definite effusions.  No acute bony changes.  IMPRESSION: No active disease.   Original Report Authenticated By: Mervin Hack, M.D.    Dg Pelvis 1-2 Views  02/06/2012  *RADIOLOGY REPORT*  Clinical Data: Recent falls.  PELVIS - 1-2 VIEW  Comparison:  None.  Findings: There is a recent fracture of the left pubic arch. There is a suggestion of some periosteal reaction around both components of the fractures suggesting that this is not acute.  There is no additional evidence of acute skeletal trauma.  The sacrum and sacroiliac joints are normal.  Mild degenerative changes in both hip joints are observed.   The proximal femora show no acute findings.  IMPRESSION: Recent fracture of the left pubic arch.   Original Report Authenticated By: Mervin Hack, M.D.    Ct Head Wo Contrast  02/06/2012  *RADIOLOGY REPORT*  Clinical Data:  Fall  CT HEAD WITHOUT CONTRAST CT MAXILLOFACIAL WITHOUT CONTRAST CT CERVICAL SPINE WITHOUT CONTRAST  Technique:  Multidetector CT imaging of the head, cervical spine, and maxillofacial structures were performed using the standard protocol without intravenous contrast. Multiplanar CT image reconstructions of the cervical spine and maxillofacial structures were also generated.  Comparison:   None  CT HEAD  Findings: Fracture of the nasal bone.  No skull fracture.  Negative for intracranial hemorrhage.  No acute infarct or mass.  There is atrophy and mild chronic microvascular ischemia in the white matter.  IMPRESSION: Atrophy and chronic microvascular ischemia.  No acute intracranial abnormality.  Comminuted nasal bone fracture  CT MAXILLOFACIAL  Findings:  Comminuted nasal bone fracture with some displaced fragments.  The nasal septum is deviated significantly to the left and may be fractured anteriorly.  Septal deviation may have been preexisting to the nasal bone fracture.  Negative for orbital fracture.  Negative for fracture of the mandible or zygomatic arch.  There is moderate to advanced degenerative change in the right temporal mandibular joint.  There is mucosal thickening in the paranasal sinuses including the right frontal sinus and ethmoid sinuses.  This may be due to preexisting sinusitis.  No air-fluid level. Soft tissue swelling in the forehead compatible with contusion.  IMPRESSION: Comminuted and displaced fracture of the nasal bone bilaterally. Nasal septum may be fractured anteriorly and is significantly deviated to the left.  No other fractures.  CT CERVICAL SPINE  Findings:   Negative for cervical spine fracture.  Disc degeneration and spondylosis is present.  There  is prominent facet degeneration bilaterally.  Mild posterior slip C3-4 likely related to facet and disc degeneration.  No acute bony change. Carotid calcification is present.  IMPRESSION: Moderately severe spondylosis and facet degeneration.  Negative for fracture.   Original Report Authenticated By: Camelia Phenes, M.D.    Ct Cervical Spine Wo Contrast  02/06/2012  *RADIOLOGY REPORT*  Clinical Data:  Fall  CT HEAD WITHOUT CONTRAST CT MAXILLOFACIAL WITHOUT CONTRAST CT CERVICAL SPINE WITHOUT CONTRAST  Technique:  Multidetector CT imaging of the head, cervical spine, and maxillofacial structures were performed using the standard protocol without intravenous contrast. Multiplanar CT image reconstructions of the cervical spine and maxillofacial structures were also generated.  Comparison:   None  CT HEAD  Findings: Fracture of the nasal bone.  No skull fracture.  Negative for intracranial hemorrhage.  No acute infarct or mass.  There is atrophy and mild chronic microvascular ischemia in the white matter.  IMPRESSION: Atrophy and chronic microvascular ischemia.  No acute intracranial abnormality.  Comminuted nasal bone fracture  CT MAXILLOFACIAL  Findings:  Comminuted nasal bone fracture with some displaced fragments.  The nasal septum is deviated significantly to the left and may be fractured anteriorly.  Septal deviation may have been preexisting to the nasal bone fracture.  Negative for orbital fracture.  Negative for fracture of the mandible or zygomatic arch.  There is moderate to advanced degenerative change in the right temporal mandibular joint.  There is mucosal thickening in the paranasal sinuses including the right frontal sinus and ethmoid sinuses.  This may be due to preexisting sinusitis.  No air-fluid level. Soft tissue swelling in the forehead compatible with contusion.  IMPRESSION: Comminuted and displaced fracture of the nasal bone bilaterally. Nasal septum may be fractured anteriorly and is  significantly deviated to the left.  No other fractures.  CT CERVICAL SPINE  Findings:   Negative for cervical spine fracture.  Disc degeneration and spondylosis is present.  There is prominent facet degeneration bilaterally.  Mild posterior slip C3-4 likely related to facet and disc degeneration.  No acute bony change. Carotid calcification is present.  IMPRESSION: Moderately severe spondylosis and facet degeneration.  Negative for fracture.   Original Report Authenticated By: Camelia Phenes, M.D.    Ct Maxillofacial Wo Cm  02/06/2012  *RADIOLOGY REPORT*  Clinical Data:  Fall  CT HEAD WITHOUT CONTRAST CT MAXILLOFACIAL WITHOUT CONTRAST CT CERVICAL SPINE WITHOUT CONTRAST  Technique:  Multidetector CT imaging of the head, cervical spine, and maxillofacial structures were performed using the standard protocol without intravenous contrast. Multiplanar CT image reconstructions of the cervical spine and maxillofacial structures were also generated.  Comparison:   None  CT HEAD  Findings: Fracture of the nasal bone.  No skull fracture.  Negative for intracranial hemorrhage.  No acute infarct or mass.  There is atrophy and mild chronic microvascular ischemia in the white matter.  IMPRESSION: Atrophy and chronic microvascular ischemia.  No acute intracranial abnormality.  Comminuted nasal bone fracture  CT MAXILLOFACIAL  Findings:  Comminuted nasal bone fracture with some displaced fragments.  The nasal septum is deviated significantly to the left and may be fractured anteriorly.  Septal deviation may have been preexisting to the nasal bone fracture.  Negative for orbital fracture.  Negative for fracture of the mandible or zygomatic arch.  There is moderate to advanced degenerative change in the right temporal mandibular joint.  There is mucosal thickening in the paranasal sinuses including the right frontal sinus and ethmoid sinuses.  This may be due to preexisting sinusitis.  No air-fluid level. Soft tissue swelling in  the forehead compatible with contusion.  IMPRESSION: Comminuted and displaced fracture of the nasal bone bilaterally. Nasal septum may be fractured anteriorly and is significantly deviated to the left.  No other fractures.  CT CERVICAL SPINE  Findings:   Negative for cervical spine fracture.  Disc degeneration and spondylosis is present.  There is prominent facet degeneration bilaterally.  Mild posterior slip C3-4 likely related to facet and disc degeneration.  No acute bony change. Carotid calcification is present.  IMPRESSION: Moderately severe spondylosis and facet degeneration.  Negative for fracture.   Original Report Authenticated By: Camelia Phenes, M.D.     DISCHARGE EXAMINATION: Blood pressure 112/46, pulse 71, temperature 98.3 F (36.8 C), temperature source Oral, resp. rate 19, height 5' (1.524 m), weight 57.7 kg (127 lb 3.3 oz), SpO2 98.00%. General appearance: alert, cooperative, appears stated age and no distress Head: bruising noted both sides of face. no active bleeding. Resp: clear to auscultation bilaterally Cardio: regular rate and rhythm, S1, S2 normal, no murmur, click, rub or gallop GI: soft, non-tender; bowel sounds normal; no masses,  no organomegaly Extremities: extremities normal, atraumatic, no cyanosis or edema Neurologic: Grossly normal  DISPOSITION: Home with Home health  Discharge Orders  Future Orders Please Complete By Expires   Diet - low sodium heart healthy      Increase activity slowly      Discharge instructions      Comments:   When you get up from lying position, spend a few minutes sitting up before standing. And then once you stand, dont start walking for another few minutes. Use the TED stockings (knee high) during the daytime.     Current Discharge Medication List    START taking these medications   Details  ciprofloxacin (CIPRO) 250 MG tablet Take 1 tablet (250 mg total) by mouth 2 (two) times daily. Qty: 14 tablet, Refills: 0    Elastic  Bandages & Supports (T.E.D. KNEE LENGTH/L-REGULAR) MISC Use during daytime every day Qty: 1 each, Refills: 5      CONTINUE these medications which have CHANGED   Details  carvedilol (COREG) 6.25 MG tablet Take 1 tablet (6.25 mg total) by mouth 2 (two) times daily with a meal. Qty: 60 tablet, Refills: 1    traZODone (DESYREL) 50 MG tablet Take 2 tablets (100 mg total) by mouth at bedtime. Qty: 30 tablet, Refills: 0      CONTINUE these medications which have NOT CHANGED   Details  aspirin 81 MG chewable tablet Chew 81 mg by mouth daily.    Calcium Carbonate-Vitamin D (CALCIUM 600 + D PO) Take 1 tablet by mouth 2 (two) times daily.    Cholecalciferol (VITAMIN D-3) 1000 UNITS CAPS Take 1 capsule by mouth daily.    citalopram (CELEXA) 20 MG tablet Take 20 mg by mouth daily.    cycloSPORINE (NEORAL) 100 MG/ML microemulsion solution Take 100 mg by mouth every morning.     cycloSPORINE modified (NEORAL) 25 MG capsule Take 75 mg by mouth every evening.    ferrous sulfate 325 (65 FE) MG tablet Take 325 mg by mouth daily with breakfast.    furosemide (LASIX) 20 MG tablet Take 20 mg by mouth every morning.     hydrocortisone (CORTEF) 20 MG tablet Take by mouth 2 (two) times daily.     levothyroxine (SYNTHROID, LEVOTHROID) 100 MCG tablet Take 100 mcg by mouth daily. Take on an empty stomach with a glass of water at least 30-60 minutes before breakfast    magnesium oxide (MAG-OX) 400 MG tablet Take 400 mg by mouth daily.    Multiple Vitamin (MULITIVITAMIN WITH MINERALS) TABS Take 1 tablet by mouth daily.    mycophenolate (CELLCEPT) 500 MG tablet Take 500 mg by mouth 2 (two) times daily.     pravastatin (PRAVACHOL) 20 MG tablet Take 20 mg by mouth at bedtime.     sulfamethoxazole-trimethoprim (BACTRIM,SEPTRA) 400-80 MG per tablet Take 1 tablet by mouth daily.    valGANciclovir (VALCYTE) 450 MG tablet Take 450 mg by mouth every other day. Monday , Wednesday and Friday      STOP taking  these medications     HYDROcodone-acetaminophen (NORCO/VICODIN) 5-325 MG per tablet      busPIRone (BUSPAR) 5 MG tablet      dapsone 25 MG tablet      omeprazole (PRILOSEC) 20 MG capsule      potassium chloride SA (K-DUR,KLOR-CON) 20 MEQ tablet      predniSONE (DELTASONE) 10 MG tablet      tacrolimus (PROGRAF) 0.5 MG capsule        Follow-up Information    Follow up with Neena Rhymes, MD. Schedule an appointment as soon as possible for a visit in 1  week.   Contact information:   [address] [place] [address] [phone]          TOTAL DISCHARGE TIME: 45 mins  [facility]  [facility] Pager [phone]  02/07/2012, 1:46 PM

## 2012-02-07 NOTE — Progress Notes (Signed)
   CARE MANAGEMENT NOTE 02/07/2012  Patient:  Nancy Blair, Nancy Blair   Account Number:  [id number]  Date Initiated:  02/07/2012  Documentation initiated by:  Jiles Crocker  Subjective/Objective Assessment:   ADMITTED WITH SYNCOPY     Action/Plan:   PCP: Neena Rhymes, MD  LIVES AT HOME WITH SPOUSE   Anticipated DC Date:  02/08/2012   Anticipated DC Plan:  HOME/SELF CARE      DC Planning Services  CM consult          Status of service:  In process, will continue to follow Medicare Important Message given?  NA - LOS <3 / Initial given by admissions (If response is "NO", the following Medicare IM given date fields will be blank)  Per UR Regulation:  Reviewed for med. necessity/level of care/duration of stay  Comments:  02/07/2012- B Malkia Nippert RN, BSN, MHA

## 2012-02-07 NOTE — Evaluation (Signed)
Physical Therapy Evaluation Patient Details Name: Nancy Blair MRN: [id number] DOB: 1935-09-20 Today's Date: 02/07/2012 Time: 4098-1191 PT Time Calculation (min): 25 min  PT Assessment / Plan / Recommendation Clinical Impression  pt adm with syncoe and has had multiople falls at home without syncope; Certainly presents as continued increased fall risk and strongly recommend f/u HHPT vs OPPT. Will follow    PT Assessment  Patient needs continued PT services    Follow Up Recommendations  Home health PT;Outpatient PT (vs. depending on what pt able to do/transportation)    Does the patient have the potential to tolerate intense rehabilitation      Barriers to Discharge        Equipment Recommendations  None recommended by PT    Recommendations for Other Services     Frequency Min 3X/week    Precautions / Restrictions Precautions Precautions: Fall Restrictions Weight Bearing Restrictions: No   Pertinent Vitals/Pain       Mobility  Bed Mobility Bed Mobility: Supine to Sit Supine to Sit: 6: Modified independent (Device/Increase time) Transfers Transfers: Sit to Stand;Stand to Sit Sit to Stand: From toilet;From bed;4: Min guard;5: Supervision Stand to Sit: To toilet;To chair/3-in-1;4: Min guard;5: Supervision Details for Transfer Assistance: cues for hand placement and safey Ambulation/Gait Ambulation/Gait Assistance: 4: Min assist;4: Min guard Ambulation Distance (Feet): 170 Feet Assistive device: Rolling walker Ambulation/Gait Assistance Details: cues for RW safety; assist for RW direction and technique Gait Pattern: Step-through pattern General Gait Details: amb 10 feet at pt request without RW and pt required min assist and attempted to furniture walk and bear wt on the IV pole; discussed RW and safety at home with pt    Shoulder Instructions     Exercises     PT Diagnosis: Difficulty walking  PT Problem List: Decreased activity tolerance;Decreased  balance;Decreased mobility;Decreased range of motion;Decreased knowledge of use of DME;Decreased strength;Decreased safety awareness PT Treatment Interventions: DME instruction;Gait training;Functional mobility training;Therapeutic activities;Therapeutic exercise;Balance training;Patient/family education   PT Goals Acute Rehab PT Goals PT Goal Formulation: With patient Time For Goal Achievement: 02/14/12 Potential to Achieve Goals: Good Pt will go Sit to Stand: with modified independence PT Goal: Sit to Stand - Progress: Goal set today Pt will go Stand to Sit: with modified independence PT Goal: Stand to Sit - Progress: Goal set today Pt will Ambulate: >150 feet;with supervision PT Goal: Ambulate - Progress: Goal set today Pt will Perform Home Exercise Program: with supervision, verbal cues required/provided PT Goal: Perform Home Exercise Program - Progress: Goal set today  Visit Information  Last PT Received On: 02/07/12 Assistance Needed: +1    Subjective Data  Subjective: i did not sleep, this bed drives me crazy   Prior Functioning  Home Living Lives With: Spouse Available Help at Discharge: Family Type of Home: House Home Access: Level entry Home Layout: One level Bathroom Shower/Tub: Naval architect Equipment: Wheelchair - manual;Walker - rolling Additional Comments: pt with multiple falls recently (2-3x this last week) 1 fall daytime, 1 at night Prior Function Level of Independence:  (amb with RW) Comments: pt reports all falls have occurred since the one resulting in pelvic fx; pt had no PT f/u after fall stating that her MD said she "didn't need it" Communication Communication: No difficulties    Cognition  Overall Cognitive Status: Appears within functional limits for tasks assessed/performed Arousal/Alertness: Awake/alert Orientation Level: Appears intact for tasks assessed Behavior During Session: [facility] for tasks performed    Extremity/Trunk  Assessment  Right Upper Extremity Assessment RUE ROM/Strength/Tone: [facility] for tasks assessed Left Upper Extremity Assessment LUE ROM/Strength/Tone: WFL for tasks assessed Right Lower Extremity Assessment RLE ROM/Strength/Tone: [facility] for tasks assessed Left Lower Extremity Assessment LLE ROM/Strength/Tone: Deficits LLE ROM/Strength/Tone Deficits: 3+/5 at hip; otherwise grossly [facility]   Balance Static Standing Balance Static Standing - Balance Support: No upper extremity supported;During functional activity Static Standing - Level of Assistance: 4: Min assist  End of Session PT - End of Session Equipment Utilized During Treatment: Gait belt Activity Tolerance: Patient tolerated treatment well Patient left: in chair;with call bell/phone within reach  GP Functional Assessment Tool Used: clinical judgement Functional Limitation: Mobility: Walking and moving around Mobility: Walking and Moving Around Current Status (W0981): At least 1 percent but less than 20 percent impaired, limited or restricted Mobility: Walking and Moving Around Goal Status [phone]): At least 1 percent but less than 20 percent impaired, limited or restricted   [facility] 02/07/2012, 10:17 AM

## 2012-02-08 NOTE — Progress Notes (Signed)
Telephone call to patient's home to offer home health care choices, patient and spouse chose [facility] for home health needs. Referral given to Norberta Keens RN with [facility] - they will contact patient today. Abelino Derrick RN,BSN,MHA

## 2012-02-11 LAB — URINE CULTURE

## 2012-02-13 ENCOUNTER — Telehealth: Payer: Self-pay | Admitting: *Deleted

## 2012-02-13 NOTE — Telephone Encounter (Signed)
Noted vm from Lenell Antu (home care rep) wanted to request an order for Knee High Ted hose stockings to be sent to pt [facility] [phone] per pt received discharge summary from hospital for pt to wear ted hose, noted pt is 27ft tall and weighs 130lbs, also wanted an order for pt to have PT for 2-3 weeks to assist with balance per fall prevention, please advise on hose

## 2012-02-14 NOTE — Telephone Encounter (Signed)
Ok for PT order, knee high TED hose for 20-30 mmHG compression

## 2012-02-14 NOTE — Telephone Encounter (Signed)
Left message on Nancy Blair Vm to give verbal order for pt to have PT to assist with balance/fall risk per vm advised ok to leave the vm to give verbal order and advised to call office if further assistance needed,also faxed signed order for pt ted hose as follows:  Knee High Ted Hose Sig:20-30 mmHg Wear daily One pair   To [facility] on [address]

## 2012-02-28 DIAGNOSIS — I951 Orthostatic hypotension: Secondary | ICD-10-CM

## 2012-02-28 DIAGNOSIS — R55 Syncope and collapse: Secondary | ICD-10-CM

## 2012-02-28 DIAGNOSIS — M6281 Muscle weakness (generalized): Secondary | ICD-10-CM

## 2012-02-28 DIAGNOSIS — R269 Unspecified abnormalities of gait and mobility: Secondary | ICD-10-CM

## 2012-03-13 ENCOUNTER — Telehealth: Payer: Self-pay | Admitting: Family Medicine

## 2012-03-13 NOTE — Telephone Encounter (Signed)
Pt called and stated that [facility] told her to come in to have blood work. I don't see anything but that doesn't mean anything. I have her scheduled for this Friday but wanted to check to make sure Nancy Blair would have what she would need. Thanks amy

## 2012-03-13 NOTE — Telephone Encounter (Signed)
Pt indicated that she does not have orders and has no idea what is needed. Pt states that all she knows is they told her that she needed to have blood work so that is why she called to schedule appt. Pt will call [facility] on Friday to determine what needs to be drawn. Pt advise to have [facility] fax orders or she can bring them in at appt. Pt ok

## 2012-03-13 NOTE — Telephone Encounter (Signed)
Left message to call office. Called to clarify if Pt will be bringing in orders with her from [facility] because there are no orders for [place] to draw.

## 2012-03-15 ENCOUNTER — Other Ambulatory Visit: Payer: Medicare Other

## 2012-03-19 ENCOUNTER — Telehealth: Payer: Self-pay | Admitting: *Deleted

## 2012-03-19 NOTE — Telephone Encounter (Signed)
Called [facility] spoke with Carollee Herter, the transplant coordinator. Clarified that labs (CBC, CMP, Mag., Cyclosporine) for this patient will probably be drawn every 2 weeks for a brief period then go to monthly. These labs can be drawn in the office, [facility] or [facility] with results being faxed to # [phone]. Only critical results need to be paged to the on call transplant coordinator at [phone]. Vidal Schwalbe, RN

## 2012-03-19 NOTE — Telephone Encounter (Signed)
We can go ahead and draw these for her.  We can enter the labs from the form along w/ dx codes

## 2012-03-20 ENCOUNTER — Other Ambulatory Visit (INDEPENDENT_AMBULATORY_CARE_PROVIDER_SITE_OTHER): Payer: Medicare Other

## 2012-03-20 DIAGNOSIS — T86819 Unspecified complication of lung transplant: Secondary | ICD-10-CM

## 2012-03-20 DIAGNOSIS — Z298 Encounter for other specified prophylactic measures: Secondary | ICD-10-CM

## 2012-03-20 LAB — CBC WITH DIFFERENTIAL/PLATELET
Basophils Relative: 0.7 % (ref 0.0–3.0)
Eosinophils Relative: 0.9 % (ref 0.0–5.0)
HCT: 29.5 % — ABNORMAL LOW (ref 36.0–46.0)
Hemoglobin: 9.5 g/dL — ABNORMAL LOW (ref 12.0–15.0)
Lymphs Abs: 1.2 10*3/uL (ref 0.7–4.0)
MCV: 96.8 fl (ref 78.0–100.0)
Monocytes Absolute: 0.7 10*3/uL (ref 0.1–1.0)
RBC: 3.05 Mil/uL — ABNORMAL LOW (ref 3.87–5.11)
WBC: 9.6 10*3/uL (ref 4.5–10.5)

## 2012-03-20 LAB — MAGNESIUM: Magnesium: 2.1 mg/dL (ref 1.5–2.5)

## 2012-03-20 LAB — HEPATIC FUNCTION PANEL
AST: 22 U/L (ref 0–37)
Albumin: 3.3 g/dL — ABNORMAL LOW (ref 3.5–5.2)
Total Protein: 6.4 g/dL (ref 6.0–8.3)

## 2012-03-20 LAB — BASIC METABOLIC PANEL
BUN: 33 mg/dL — ABNORMAL HIGH (ref 6–23)
Calcium: 9 mg/dL (ref 8.4–10.5)
GFR: 30.37 mL/min — ABNORMAL LOW (ref >60.00)
Glucose, Bld: 112 mg/dL — ABNORMAL HIGH (ref 70–99)

## 2012-06-25 DIAGNOSIS — Z7952 Long term (current) use of systemic steroids: Secondary | ICD-10-CM | POA: Insufficient documentation

## 2012-07-19 ENCOUNTER — Ambulatory Visit (INDEPENDENT_AMBULATORY_CARE_PROVIDER_SITE_OTHER): Payer: Medicare Other | Admitting: Family Medicine

## 2012-07-19 ENCOUNTER — Telehealth: Payer: Self-pay | Admitting: *Deleted

## 2012-07-19 ENCOUNTER — Encounter: Payer: Self-pay | Admitting: Family Medicine

## 2012-07-19 VITALS — BP 118/68 | HR 157 | Temp 98.0°F | Ht 60.0 in | Wt 132.0 lb

## 2012-07-19 DIAGNOSIS — R251 Tremor, unspecified: Secondary | ICD-10-CM | POA: Insufficient documentation

## 2012-07-19 DIAGNOSIS — R5381 Other malaise: Secondary | ICD-10-CM

## 2012-07-19 DIAGNOSIS — R5383 Other fatigue: Secondary | ICD-10-CM | POA: Insufficient documentation

## 2012-07-19 DIAGNOSIS — I951 Orthostatic hypotension: Secondary | ICD-10-CM

## 2012-07-19 DIAGNOSIS — R259 Unspecified abnormal involuntary movements: Secondary | ICD-10-CM

## 2012-07-19 DIAGNOSIS — F341 Dysthymic disorder: Secondary | ICD-10-CM

## 2012-07-19 LAB — CBC WITH DIFFERENTIAL/PLATELET
Basophils Absolute: 0 10*3/uL (ref 0.0–0.1)
Basophils Relative: 0 % (ref 0–1)
Eosinophils Absolute: 0.1 10*3/uL (ref 0.0–0.7)
Lymphs Abs: 1.3 10*3/uL (ref 0.7–4.0)
MCH: 30.7 pg (ref 26.0–34.0)
Neutrophils Relative %: 85 % — ABNORMAL HIGH (ref 43–77)
Platelets: 252 10*3/uL (ref 150–400)
RBC: 3.42 MIL/uL — ABNORMAL LOW (ref 3.87–5.11)
RDW: 15.5 % (ref 11.5–15.5)
WBC: 12.3 10*3/uL — ABNORMAL HIGH (ref 4.0–10.5)

## 2012-07-19 LAB — BASIC METABOLIC PANEL
CO2: 26 mEq/L (ref 19–32)
Calcium: 10 mg/dL (ref 8.4–10.5)
Creat: 3.68 mg/dL — ABNORMAL HIGH (ref 0.50–1.10)
Sodium: 141 mEq/L (ref 135–145)

## 2012-07-19 LAB — VITAMIN B12: Vitamin B-12: 988 pg/mL — ABNORMAL HIGH (ref 211–911)

## 2012-07-19 LAB — TSH: TSH: 0.638 u[IU]/mL (ref 0.350–4.500)

## 2012-07-19 LAB — FOLATE: Folate: >20 ng/mL

## 2012-07-19 NOTE — Patient Instructions (Addendum)
Follow up in 4-6 weeks to recheck mood and fatigue We'll notify you of your lab results Someone will call you with your Neurology appt Please call and schedule an appointment with a psychiatrist to adjust your depression meds You can stop the Buproprion since it's not working Make sure you change positions slowly- allow yourself time to adjust Continue to drink plenty of fluids Call with any questions or concerns Hang in there!

## 2012-07-19 NOTE — Telephone Encounter (Signed)
Spoke with pt and her husband concerning current c/o dizziness, fatigue and insomnia. Pts husband states this has been going on for several days and that they feel like this may be related to medication interactions. Pts B/P today is 137/47 with home machine, advised husband that top number okay but bottom is lower that we like to see it. Explained to husband that if sx get worse, he may need to take his wife to the ER for evaluation.

## 2012-07-19 NOTE — Progress Notes (Signed)
  Subjective:    Patient ID: Nancy Blair, female    DOB: Dec 18, 1935, 77 y.o.   MRN: [id number]  HPI Dizziness- occurs w/ changing positions.  sxs seem to improve if pt waits a moment before walking.  Has hx of orthostatic BP prior.  Currently on Coreg BID.  Pt will drink 2 bottles of water daily  Hand tremors- denies anxiety.  Pt reports tremors are now nearly constant.  Pt reports she will have episodes where 'my whole body will shake'.  No tremor of feet.  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  Pt's only activity is bed to chair, chair to bed.  Very inactive.  Husband has been pushing her to get out but pt declines.  On Trazodone for sleep.  On Buproprion- started by Dr Ilsa Iha 6-7 months ago.  Also on Celexa.  Husband reports pt has no motivation, pt admits to withdrawing from people and things.  Pt doesn't think Buproprion is helping.   Review of Systems For ROS see HPI     Objective:   Physical Exam  Vitals reviewed. Constitutional: She is oriented to person, place, and time. She appears well-developed and well-nourished. No distress.  HENT:  Head: Normocephalic and atraumatic.  Neck: Normal range of motion. Neck supple. No thyromegaly present.  Cardiovascular:  Tachy but regular S1/S2  Pulmonary/Chest: Effort normal and breath sounds normal. No respiratory distress. She has no wheezes. She has no rales.  Musculoskeletal: She exhibits no edema.  Lymphadenopathy:    She has no cervical adenopathy.  Neurological: She is alert and oriented to person, place, and time.  Coarse tremor of hands bilaterally- no rolling  Skin: Skin is warm and dry. No rash noted. No erythema.  Psychiatric:  Flat affect, withdrawn          Assessment & Plan:

## 2012-07-22 ENCOUNTER — Telehealth: Payer: Self-pay | Admitting: *Deleted

## 2012-07-22 DIAGNOSIS — R6889 Other general symptoms and signs: Secondary | ICD-10-CM

## 2012-07-22 NOTE — Telephone Encounter (Signed)
Message copied by Verdie Shire on Mon Jul 22, 2012  4:50 PM ------      Message from: Sheliah Hatch      Created: Sun Jul 21, 2012  9:05 AM       Pt's Cr has more than doubled in the last 4 months- based on this, needs nephrology referral.      WBC is also elevated- indicating possible infection.  Pt didn't have specific complaints in office but given her immunosuppressed state from her transplant meds, needs appt at [facility] ASAP.      Fax labs to [facility] ------

## 2012-07-22 NOTE — Telephone Encounter (Signed)
Spoke with the pt and informed her of recent lab results and note.  Pt understood and agreed to Nephrology referral which has been order and sent.  Pt stated that she has an appt at [facility] in June, and she want to know should she get a sooner appt than June.  Informed her yes she needs to call [facility] and informed them that she had lab done and she needs to get in ASAP because of abnrl labs.  Pt stated that she will call them.  Informed her I will call and get an fax# and fax her recent labs to [facility].  Labs faxed.//AB/CMA

## 2012-07-24 ENCOUNTER — Telehealth: Payer: Self-pay | Admitting: Family Medicine

## 2012-07-24 NOTE — Telephone Encounter (Signed)
Patient's spouse, Ethelene Browns, calling.  His question is while they are waiting on patient to be scheduled with Nephrology, what are they supposed to do about treating patient's infection?

## 2012-07-25 NOTE — Telephone Encounter (Signed)
At this time we don't know if there is an infection.  Her white blood cell count was elevated but this could be due to her transplant meds.  The nephrology evaluation is for worsening kidney function (could also be med related)

## 2012-07-25 NOTE — Telephone Encounter (Signed)
Spoke with the pt and informed her that I was calling her back regarding her husbands note.  Informed her that Dr. [name] said at this time we don't know if there is an infection.  Her white blood cell count was elevated but this could be due to her transplant meds.  The nephrology evaluation is for worsening kidney function (could also be med related).   Pt stated that she spoke with Luster Landsberg and told her she will call and make an appt with [facility], so she was given the #.  Also informed the pt that I spoke with Carollee Herter at [facility] and informed her that Dr. [name] would like for her to see Nephrology, and she thought that was a good idea.  Also explained to her that Dr.  [name] would like for her to be seen there sooner and Carollee Herter said she will call her.//AB/CMA

## 2012-07-28 NOTE — Assessment & Plan Note (Signed)
Recurrent issue for pt.  Pt reports she is able to control sxs when she changes positions slowly.  No med changes at this time.

## 2012-07-28 NOTE — Assessment & Plan Note (Signed)
New.  Pt reports she has had sxs since lung transplant.  Will have episodes where 'whole body will shake'.  Pt and husband report these are occuring more frequently.  Refer to neurology for complete evaluation and tx.  Pt expressed understanding and is in agreement w/ plan.

## 2012-07-28 NOTE — Assessment & Plan Note (Signed)
Deteriorated.  Pt was started on Wellbutrin 7 months ago but pt and husband report no improvement.  Encouraged pt to call and schedule appt w/ psych to better control sxs.

## 2012-07-28 NOTE — Assessment & Plan Note (Signed)
New.  Suspect this is multifactorial- depression, poor health, inactivity/deconditioning.  Check labs to r/o anemia, abnormal thyroid, infection, electrolyte abnormality.  Next steps dependent on labs.  Encouraged pt to f/u w/ [facility].  Pt expressed understanding and is in agreement w/ plan.

## 2012-07-29 ENCOUNTER — Other Ambulatory Visit (INDEPENDENT_AMBULATORY_CARE_PROVIDER_SITE_OTHER): Payer: Medicare Other

## 2012-07-29 DIAGNOSIS — Z2989 Encounter for other specified prophylactic measures: Secondary | ICD-10-CM

## 2012-07-29 DIAGNOSIS — T86819 Unspecified complication of lung transplant: Secondary | ICD-10-CM

## 2012-07-29 DIAGNOSIS — Z298 Encounter for other specified prophylactic measures: Secondary | ICD-10-CM

## 2012-07-29 LAB — HEPATIC FUNCTION PANEL
ALT: 15 U/L (ref 0–35)
Albumin: 3.3 g/dL — ABNORMAL LOW (ref 3.5–5.2)
Total Protein: 6.4 g/dL (ref 6.0–8.3)

## 2012-07-29 LAB — CBC WITH DIFFERENTIAL/PLATELET
Basophils Relative: 0.4 % (ref 0.0–3.0)
Eosinophils Relative: 0.9 % (ref 0.0–5.0)
HCT: 30 % — ABNORMAL LOW (ref 36.0–46.0)
Lymphs Abs: 1.7 10*3/uL (ref 0.7–4.0)
MCV: 95.1 fl (ref 78.0–100.0)
Monocytes Absolute: 0.7 10*3/uL (ref 0.1–1.0)
Monocytes Relative: 7.2 % (ref 3.0–12.0)
Platelets: 247 10*3/uL (ref 150.0–400.0)
RBC: 3.16 Mil/uL — ABNORMAL LOW (ref 3.87–5.11)
WBC: 9.1 10*3/uL (ref 4.5–10.5)

## 2012-07-29 LAB — COMPREHENSIVE METABOLIC PANEL
Albumin: 3.2 g/dL — ABNORMAL LOW (ref 3.5–5.2)
Alkaline Phosphatase: 38 U/L — ABNORMAL LOW (ref 39–117)
BUN: 36 mg/dL — ABNORMAL HIGH (ref 6–23)
CO2: 25 mEq/L (ref 19–32)
GFR: 23.03 mL/min — ABNORMAL LOW (ref >60.00)
Glucose, Bld: 87 mg/dL (ref 70–99)
Potassium: 3.5 mEq/L (ref 3.5–5.1)
Sodium: 139 mEq/L (ref 135–145)
Total Bilirubin: 0.4 mg/dL (ref 0.3–1.2)
Total Protein: 6.5 g/dL (ref 6.0–8.3)

## 2012-07-29 LAB — BASIC METABOLIC PANEL
Chloride: 102 mEq/L (ref 96–112)
GFR: 23.27 mL/min — ABNORMAL LOW (ref >60.00)
Potassium: 3.4 mEq/L — ABNORMAL LOW (ref 3.5–5.1)
Sodium: 139 mEq/L (ref 135–145)

## 2012-07-30 LAB — CYCLOSPORINE LEVEL: Cyclosporine, Blood: 167 ng/mL (ref 140–330)

## 2012-08-05 ENCOUNTER — Telehealth: Payer: Self-pay | Admitting: *Deleted

## 2012-08-05 NOTE — Telephone Encounter (Signed)
Nancy Blair with [facility] called to say that the pt is been seen there today by Dr. [name] and she is requesting the last discharge note [facility] faxed.  Faxed the last office note (06-25-12).//AB/CMA

## 2012-08-06 ENCOUNTER — Telehealth: Payer: Self-pay | Admitting: Family Medicine

## 2012-08-06 NOTE — Telephone Encounter (Signed)
Patient spouse states the patient received a clean bill of health from [facility] and to cancel all upcoming testing/appointments.

## 2012-08-07 NOTE — Telephone Encounter (Signed)
Noted  

## 2012-08-08 ENCOUNTER — Ambulatory Visit (INDEPENDENT_AMBULATORY_CARE_PROVIDER_SITE_OTHER): Payer: Medicare Other | Admitting: Neurology

## 2012-08-08 ENCOUNTER — Encounter: Payer: Self-pay | Admitting: Neurology

## 2012-08-08 VITALS — BP 130/68 | HR 72 | Temp 97.8°F | Resp 12 | Ht 60.5 in | Wt 134.0 lb

## 2012-08-08 DIAGNOSIS — R251 Tremor, unspecified: Secondary | ICD-10-CM

## 2012-08-08 DIAGNOSIS — R259 Unspecified abnormal involuntary movements: Secondary | ICD-10-CM

## 2012-08-08 DIAGNOSIS — G4700 Insomnia, unspecified: Secondary | ICD-10-CM

## 2012-08-08 MED ORDER — PRIMIDONE 50 MG PO TABS: 50.0000 mg | ORAL_TABLET | Freq: Every day | ORAL | Status: DC

## 2012-08-08 NOTE — Progress Notes (Signed)
Subjective:   Nancy Blair was seen in consultation in the movement disorder clinic at the request of Neena Rhymes, MD.  The evaluation is for tremor.  This patient is accompanied in the office by her spouse who supplements the history.   The patient is a 77 y.o. left handed female with a history of tremor.  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  They report that the entire body shakes.  She does not fall while shaking but if she holds onto something, she will not fall.  She states that they are discrete episodes and last seconds to minutes, but upon further questioning she admits that she generally has tremor most of the time.  She feels normal after one of the more severe episodes.  It is a rhythmic shake.  It can happen any time, day or night.  There is no loss of bladder/bowel control.  There is no family hx of tremor.    Upon review of her old records, I saw her creatinine had more than doubled.  It was 3.68 2 weeks ago and it was rechecked and it was 2.21 week ago.  Her baseline creatinine is about 1.7.  Her primary care physician sent her out with a nephrologist, but they canceled that appointment after her transplant team from [facility] told her that everything looked fine.  Affected by caffeine:  no Affected by alcohol:  no Affected by stress:  no Affected by fatigue:  no    Outside reports reviewed: historical medical records.  Allergies  Allergen Reactions  . Grapefruit Extract   . Neomycin-Bacitracin Zn-Polymyx     REACTION: hives,blisters  . Oysters (Shellfish Allergy)   . Penicillins     REACTION: swelling  . Prednisone Other (See Comments)    crazy  . Septra (Sulfamethoxazole-Tmp Ds)     unknown    Current Outpatient Prescriptions on File Prior to Visit  Medication Sig Dispense Refill  . aspirin 81 MG chewable tablet Chew 81 mg by mouth daily.      . Calcium Carbonate-Vitamin D (CALCIUM 600 + D PO) Take 1 tablet by  mouth 2 (two) times daily.      . carvedilol (COREG) 6.25 MG tablet Take 1 tablet (6.25 mg total) by mouth 2 (two) times daily with a meal.  60 tablet  1  . Cholecalciferol (VITAMIN D-3) 1000 UNITS CAPS Take 1 capsule by mouth daily.      . citalopram (CELEXA) 20 MG tablet Take 20 mg by mouth daily.      . cycloSPORINE (NEORAL) 100 MG/ML microemulsion solution Take 100 mg by mouth every morning.       . cycloSPORINE modified (NEORAL) 25 MG capsule Take 75 mg by mouth every evening.      Jae Dire Bandages & Supports (T.E.D. KNEE LENGTH/L-REGULAR) MISC Use during daytime every day  1 each  5  . ferrous sulfate 325 (65 FE) MG tablet Take 325 mg by mouth daily with breakfast.      . furosemide (LASIX) 20 MG tablet Take 20 mg by mouth every morning.       . hydrocortisone (CORTEF) 20 MG tablet Take by mouth 2 (two) times daily.       Marland Kitchen levothyroxine (SYNTHROID, LEVOTHROID) 100 MCG tablet Take 100 mcg by mouth daily. Take on an empty stomach with a glass of water at least 30-60 minutes before breakfast      . magnesium oxide (MAG-OX) 400  MG tablet Take 400 mg by mouth daily.      . Multiple Vitamin (MULITIVITAMIN WITH MINERALS) TABS Take 1 tablet by mouth daily.      . mycophenolate (CELLCEPT) 500 MG tablet Take 500 mg by mouth 2 (two) times daily.       . pravastatin (PRAVACHOL) 20 MG tablet Take 20 mg by mouth at bedtime.       . sulfamethoxazole-trimethoprim (BACTRIM,SEPTRA) 400-80 MG per tablet Take 1 tablet by mouth daily.      . traZODone (DESYREL) 50 MG tablet Take 2 tablets (100 mg total) by mouth at bedtime.  30 tablet  0  . valGANciclovir (VALCYTE) 450 MG tablet Take 450 mg by mouth every other day. Monday , Wednesday and Friday       No current facility-administered medications on file prior to visit.    Past Medical History  Diagnosis Date  . Emphysema of lung   . Hypertension   . Hyperlipidemia   . Allergy   . Anemia   . GERD (gastroesophageal reflux disease)   . Adrenal  insufficiency   . Squamous acanthoma of skin 06/2011    rt leg  . Hypothyroidism   . CKD (chronic kidney disease) stage 4, GFR 15-29 ml/min   . Lung transplant status, bilateral     Past Surgical History  Procedure Laterality Date  . Appendectomy    . Abdominal hysterectomy    . Tonsillectomy    . Total lung replacement  2012  . Esophagogastroduodenoscopy  05/26/2011    Procedure: ESOPHAGOGASTRODUODENOSCOPY (EGD);  Surgeon: Rob Bunting, MD;  Location: [facility];  Service: Endoscopy;  Laterality: N/A;    History   Social History  . Marital Status: Married    Spouse Name: N/A    Number of Children: N/A  . Years of Education: N/A   Occupational History  . Not on file.   Social History Main Topics  . Smoking status: Former Smoker -- 3.00 packs/day    Start date: 04/17/1952  . Smokeless tobacco: Never Used  . Alcohol Use: No  . Drug Use: No  . Sexually Active: No   Other Topics Concern  . Not on file   Social History Narrative   Lives with husband   Is a DNAR   Daughter who is an Rn [phone]    Family Status  Relation Status Death Age  . Mother Deceased   . Father Deceased     Review of Systems The patient does admit that she does not sleep well at night, taking 3 pills to help her sleep at night.  However, she also admits that she sleeps much of the day.  She does not exercise.  A complete 10 system ROS was obtained and was negative apart from what is mentioned.   Objective:   VITALS:   Filed Vitals:   08/08/12 1030  BP: 130/68  Pulse: 72  Temp: 97.8 F (36.6 C)  Resp: 12  Height: 5' 0.5" (1.537 m)  Weight: 134 lb (60.782 kg)   Gen:  Appears stated age and in NAD. HEENT:  Normocephalic, atraumatic. The mucous membranes are moist. The superficial temporal arteries are without ropiness or tenderness. Cardiovascular: Regular rate and rhythm with 3/6 systolic ejection murmur. Lungs: Clear to auscultation bilaterally. Neck: There are no carotid  bruits noted bilaterally.  NEUROLOGICAL:  Orientation:  The patient is alert and oriented x 3.  Recent and remote memory are intact.  Attention span and concentration are normal.  Able to name objects and repeat without trouble.  Fund of knowledge is appropriate Cranial nerves: There is good facial symmetry with the exception of left congenital ptosis. The pupils are pinpoint and nonreactive. Fundoscopic exam is attempted but the disc margins are not well visualized bilaterally. Extraocular muscles are intact and visual fields are full to confrontational testing. Speech is fluent and clear. Soft palate rises symmetrically and there is no tongue deviation. Hearing is intact to conversational tone. Tone: Tone is good throughout.  There is no cogwheel rigidity Sensation: Sensation is intact to light touch and pinprick throughout (facial, trunk, extremities). Vibration is intact at the bilateral big toe but it is decreased. There is no extinction with double simultaneous stimulation. There is no sensory dermatomal level identified. Coordination:  The patient has no dysdiadichokinesia or dysmetria. Motor: Strength is 5/5 in the bilateral upper and lower extremities.  Shoulder shrug is equal bilaterally.  There is no pronator drift.  There are no fasciculations noted. DTR's: Deep tendon reflexes are 1/4 at the bilateral biceps, triceps, brachioradialis, patella and absent at the bilateral achilles.  Plantar responses are downgoing bilaterally. Gait and Station: The patient is able to ambulate without difficulty. The patient is able to heel toe walk without any difficulty. The patient is able to ambulate in a tandem fashion. The patient is able to stand in the Romberg position.   MOVEMENT EXAM: Tremor:  There is  tremor in the UE, noted most significantly with action.  There is a fine resting tremor to the fingers, but no pill-rolling quality. The patient is able to draw Archimedes spirals without significant  difficulty but there is most certainly evidence of tremor.  There is minimal tremor at rest.  The patient is  able to pour water from one glass to another without spilling a significant amount, but the right sided tremor was notable.     Assessment/Plan:   1. Tremor.  -I suspect that much of this fine tremor that I saw on exam today is due to her antirejection medications.  I discussed this with her today.  She does describe some more discrete episodes in which tremor becomes bad for about 10-15 seconds, and I think that this likely is just an increase in her baseline tremor.  She does not describe myoclonus, which is common in patients who do have renal disease with an increase in her creatinine acutely.  This does not sound like seizure.  -After quite some time, the patient decided that she would like to try some medication to see if that would help.  We decided on primidone.  I asked her to notify the transplant team that we are going to try this medication at low dosages. Risks, benefits, side effects and alternative therapies were discussed.  The opportunity to ask questions was given and they were answered to the best of my ability.  The patient expressed understanding and willingness to follow the outlined treatment protocols.  2. excessive daytime hypersomnolence.  -She really has her days and nights reversed.  She takes multiple sleep medications at night to help her sleep and then ends up sleeping much of the day.  We talked about sleep/wake cycle on the importance of staying awake during the day and being active and engaging in exercise that she can do. 3.  Return in about 3 months (around 11/07/2012).

## 2012-08-08 NOTE — Patient Instructions (Addendum)
1.  Start primidone - 50 mg - 1/2 tablet at night for a week and then 1 tablet nightly 2.  Make your transplant specialists aware of the medication 3.  Follow up 3 months.

## 2012-08-13 ENCOUNTER — Telehealth: Payer: Self-pay

## 2012-08-13 NOTE — Telephone Encounter (Signed)
Pt calling because the primodine is making her very sleepy, so much so that she is tired the whole next day, even on just half a pill.  She thinks it needs to be adjusted or changed.

## 2012-08-14 NOTE — Telephone Encounter (Signed)
How many days did she try it?  It does take a little time but don't want her so sleepy she cannot function.  I know she had told me last visit that she slept a lot of the day because she was sleepy, so I am assuming she means that the primidone is making her MORE sleepy?  If she has tried 1/2 pill for a week or so and it is still intolerable, d/c it and we can try topamax.  Will not interfere with her lung function but will take [place] a month to get up to decent (albeit still low) dose.  If she wishes to try, send in topamax 25 mg - 1 tablet daily for 1 week, then 2 tablets daily for 1 week and then 3 tablets daily for 1 week.  #42, rf 0 and then after that she will start topamax 100 mg - one tablet daily and may call that in #30 with 3 RF.  Make sure that she has no hx of kidney stones.

## 2012-08-15 MED ORDER — TOPIRAMATE 25 MG PO TABS: ORAL_TABLET | ORAL | Status: DC

## 2012-08-15 MED ORDER — TOPIRAMATE 100 MG PO TABS: 100.0000 mg | ORAL_TABLET | Freq: Every day | ORAL | Status: DC

## 2012-08-15 NOTE — Telephone Encounter (Signed)
Yes it made her even more sleepy, she could barely open her eyes the next day.  She has not had it in a couple of days and feels much better and would like to go ahead with the topamax.  Will send to pt's pharmacy.

## 2012-08-20 ENCOUNTER — Other Ambulatory Visit: Payer: Self-pay | Admitting: Pulmonary Disease

## 2012-08-26 ENCOUNTER — Other Ambulatory Visit: Payer: Self-pay | Admitting: Pulmonary Disease

## 2012-09-01 ENCOUNTER — Other Ambulatory Visit: Payer: Self-pay | Admitting: Pulmonary Disease

## 2012-09-02 ENCOUNTER — Ambulatory Visit (INDEPENDENT_AMBULATORY_CARE_PROVIDER_SITE_OTHER): Payer: Medicare Other | Admitting: Family Medicine

## 2012-09-02 ENCOUNTER — Encounter: Payer: Self-pay | Admitting: Family Medicine

## 2012-09-02 VITALS — BP 130/70 | HR 69 | Temp 98.0°F | Ht 60.0 in | Wt 130.2 lb

## 2012-09-02 DIAGNOSIS — R350 Frequency of micturition: Secondary | ICD-10-CM

## 2012-09-02 LAB — POCT URINALYSIS DIPSTICK
Ketones, UA: NEGATIVE
Spec Grav, UA: 1.015
Urobilinogen, UA: 0.2
pH, UA: 5

## 2012-09-02 MED ORDER — CIPROFLOXACIN HCL 250 MG PO TABS: 250.0000 mg | ORAL_TABLET | Freq: Two times a day (BID) | ORAL | Status: DC

## 2012-09-02 NOTE — Progress Notes (Signed)
  Subjective:    Patient ID: Nancy Blair, female    DOB: Mar 10, 1936, 77 y.o.   MRN: [id number]  HPI abd pain- sxs started Friday night.  Increased frequency.  + hesitancy.  + urgency.  + suprapubic pain.  No hematuria.  No urinary odor.  No fever/chills.  Hx of similar, pt's current sxs consistent w/ previous.  Is allergic to PCN, sulfa.  Has used Cipro previously w/ good results but has degree of renal insufficiency and will need dose adjustment.   Review of Systems For ROS see HPI     Objective:   Physical Exam  Vitals reviewed. Constitutional: She is oriented to person, place, and time. She appears well-developed and well-nourished. No distress.  Abdominal: Soft. She exhibits no distension. There is tenderness (L suprapubic tenderness w/ palpation, no CVA tenderness).  Neurological: She is alert and oriented to person, place, and time.  Skin: Skin is warm and dry.  Coloring is better than last visit  Psychiatric: She has a normal mood and affect. Her behavior is normal.          Assessment & Plan:

## 2012-09-02 NOTE — Patient Instructions (Addendum)
Start the Cipro twice daily x7 days Drink plenty of fluids Call with any questions or concerns Hang in there!! Happy Memorial Day!

## 2012-09-03 NOTE — Assessment & Plan Note (Signed)
Pt's sxs consistent w/ infxn.  Start abc.  Renally dose Cipro given last Cr (reviewed renal note).  Encouraged increased fluids.  Await urine cx.  Reviewed supportive care and red flags that should prompt return.  Pt expressed understanding and is in agreement w/ plan.

## 2012-09-04 LAB — URINE CULTURE

## 2012-10-12 ENCOUNTER — Other Ambulatory Visit: Payer: Self-pay | Admitting: Family Medicine

## 2012-10-15 ENCOUNTER — Telehealth: Payer: Self-pay | Admitting: Family Medicine

## 2012-10-15 NOTE — Telephone Encounter (Signed)
This is not a pt of Dr. Claris Che and he recommends that the husband call the pt's primary doctor. I do not see that the husband is listed on the release form.

## 2012-10-15 NOTE — Telephone Encounter (Signed)
PT husband called and stated that he would like to speak with Dr. Scotty Court regarding his wife. He states that when Dr. Scotty Court retired, after taking care of himself and his wife for 40 years, that he instructed him to "call him if he ever needed him". The patient in requesting a means to contact Dr. Scotty Court. After explaining that I was unable to give him that information, he stated that he would like to talk to Dr. Clent Ridges since that's who Dr. Scotty Court recommended. Please assist.

## 2012-10-15 NOTE — Telephone Encounter (Signed)
Lm on pts vmail

## 2012-10-28 ENCOUNTER — Telehealth: Payer: Self-pay | Admitting: Family Medicine

## 2012-10-28 NOTE — Telephone Encounter (Signed)
Pt would like a refill on his citalopram (CELEXA) 20 MG tablet  Pharmacy:[id number] - [place], [place] - [address]

## 2012-10-28 NOTE — Telephone Encounter (Signed)
Patient has an Rx already. Her psychologist recommended that she double her Celexa. Last OV with Dr [name] 09/02/2012 for UTI. Please advise. GF/RN

## 2012-10-29 MED ORDER — CITALOPRAM HYDROBROMIDE 40 MG PO TABS: 40.0000 mg | ORAL_TABLET | Freq: Every day | ORAL | Status: DC

## 2012-10-29 NOTE — Telephone Encounter (Signed)
Spoke with patient and advised of Dr Rennis Golden directions. Patient stated understanding. Sent new Rx to [facility] on [address]. GF/RN

## 2012-10-29 NOTE — Telephone Encounter (Signed)
Ok to send new script for 40mg  daily, #30, 6 refills.  Can take 2 of the pills she has at home to finish up her old med but take only 1 of the new script

## 2012-11-08 ENCOUNTER — Ambulatory Visit: Payer: Medicare Other | Admitting: Neurology

## 2012-11-11 ENCOUNTER — Encounter: Payer: Self-pay | Admitting: Neurology

## 2012-11-11 ENCOUNTER — Ambulatory Visit (INDEPENDENT_AMBULATORY_CARE_PROVIDER_SITE_OTHER): Payer: Medicare Other | Admitting: Neurology

## 2012-11-11 VITALS — BP 96/60 | HR 72 | Temp 97.5°F | Resp 18 | Wt 130.0 lb

## 2012-11-11 DIAGNOSIS — R259 Unspecified abnormal involuntary movements: Secondary | ICD-10-CM

## 2012-11-11 DIAGNOSIS — R296 Repeated falls: Secondary | ICD-10-CM

## 2012-11-11 DIAGNOSIS — Z9181 History of falling: Secondary | ICD-10-CM

## 2012-11-11 DIAGNOSIS — R251 Tremor, unspecified: Secondary | ICD-10-CM

## 2012-11-11 NOTE — Progress Notes (Signed)
Subjective:   Nancy Blair was seen in followup in the movement disorder clinic for tremor.    This patient is accompanied in the office by her spouse who supplements the history.   The patient is a 77 y.o. left handed female with a history of tremor.  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  They report that the entire body shakes.  She does not fall while shaking but if she holds onto something, she will not fall.  She states that they are discrete episodes and last seconds to minutes, but upon further questioning she admits that she generally has tremor most of the time.  She feels normal after one of the more severe episodes.  It is a rhythmic shake.  It can happen any time, day or night.  There is no loss of bladder/bowel control.  There is no family hx of tremor.    Upon review of her old records, I saw her creatinine had more than doubled.  It was 3.68 2 weeks ago and it was rechecked and it was 2.21 week ago.  Her baseline creatinine is about 1.7.  Her primary care physician sent her out with a nephrologist, but they canceled that appointment after her transplant team from [facility] told her that everything looked fine.  Affected by caffeine:  no Affected by alcohol:  no Affected by stress:  no Affected by fatigue:  no  11/11/2012 update:  On April 24, I started the patient on low-dose primidone.  He called me on April 29, letting me know that she had stopped it for a few days because she was sleepy and did not want to go back on the medication.  Therefore, we started Topamax at that time.  I did not see Topamax on the medication list that they brought me, so I called the pharmacy.  It appears that he picked up to 25 mg titration dosage, but never picked up a 100 mg dosage.  She and her husband do not recall this, but thinks that perhaps her transplant team at [facility] told her not to take it.   She does, however, report that she is not shaking as much  and doesn't want to take any more medication.  She has also had some falls.  These have all been related to dizziness.  She reports that she has had about 5 falls in the last 10 days.  She will get up, get dizzy and fall.  Fortunately, she has sustained no serious injuries.  She has not had any syncopal episodes.  Outside reports reviewed: historical medical records.  PREVIOUS MED:  Primidone (sleepy at very low dose);  ? topamax (?transplant team told her not to take?)  Allergies  Allergen Reactions  . Bupropion     sleepy  . Grapefruit Extract   . Neomycin-Bacitracin Zn-Polymyx     REACTION: hives,blisters  . Oysters (Shellfish Allergy)   . Penicillins     REACTION: swelling  . Prednisone Other (See Comments)    crazy  . Septra (Sulfamethoxazole-Tmp Ds)     unknown    Current Outpatient Prescriptions on File Prior to Visit  Medication Sig Dispense Refill  . aspirin 81 MG chewable tablet Chew 81 mg by mouth daily.      . Calcium Carbonate-Vitamin D (CALCIUM 600 + D PO) Take 1 tablet by mouth 2 (two) times daily.      . carvedilol (COREG) 6.25 MG tablet Take 1  tablet (6.25 mg total) by mouth 2 (two) times daily with a meal.  60 tablet  1  . Cholecalciferol (VITAMIN D-3) 1000 UNITS CAPS Take 1 capsule by mouth daily.      . citalopram (CELEXA) 40 MG tablet Take 1 tablet (40 mg total) by mouth daily.  30 tablet  6  . cycloSPORINE (NEORAL) 100 MG/ML microemulsion solution Take 100 mg by mouth every morning.       . cycloSPORINE modified (NEORAL) 25 MG capsule Take 75 mg by mouth every evening.      . ferrous sulfate 325 (65 FE) MG tablet Take 325 mg by mouth daily with breakfast.      . furosemide (LASIX) 20 MG tablet Take 20 mg by mouth every morning.       . hydrocortisone (CORTEF) 20 MG tablet Take by mouth 2 (two) times daily.       Marland Kitchen levothyroxine (SYNTHROID, LEVOTHROID) 100 MCG tablet Take 100 mcg by mouth daily. Take on an empty stomach with a glass of water at least 30-60  minutes before breakfast      . magnesium oxide (MAG-OX) 400 MG tablet Take 400 mg by mouth daily.      . Multiple Vitamin (MULITIVITAMIN WITH MINERALS) TABS Take 1 tablet by mouth daily.      . mycophenolate (CELLCEPT) 500 MG tablet Take 500 mg by mouth 2 (two) times daily.       . pravastatin (PRAVACHOL) 20 MG tablet Take 20 mg by mouth at bedtime.       . primidone (MYSOLINE) 50 MG tablet Take 1 tablet (50 mg total) by mouth at bedtime.  30 tablet  3  . topiramate (TOPAMAX) 100 MG tablet Take 1 tablet (100 mg total) by mouth daily.  30 tablet  3  . traZODone (DESYREL) 50 MG tablet Take 2 tablets (100 mg total) by mouth at bedtime.  30 tablet  0  . valGANciclovir (VALCYTE) 450 MG tablet Take 450 mg by mouth every other day. Monday , Wednesday and Friday      . Elastic Bandages & Supports (T.E.D. KNEE LENGTH/L-REGULAR) MISC Use during daytime every day  1 each  5   No current facility-administered medications on file prior to visit.    Past Medical History  Diagnosis Date  . Emphysema of lung   . Hypertension   . Hyperlipidemia   . Allergy   . Anemia   . GERD (gastroesophageal reflux disease)   . Adrenal insufficiency   . Squamous acanthoma of skin 06/2011    rt leg  . Hypothyroidism   . CKD (chronic kidney disease) stage 4, GFR 15-29 ml/min   . Lung transplant status, bilateral     Past Surgical History  Procedure Laterality Date  . Appendectomy    . Abdominal hysterectomy    . Tonsillectomy    . Total lung replacement  2012    b/l lung transplant  . Esophagogastroduodenoscopy  05/26/2011    Procedure: ESOPHAGOGASTRODUODENOSCOPY (EGD);  Surgeon: Rob Bunting, MD;  Location: [facility];  Service: Endoscopy;  Laterality: N/A;    History   Social History  . Marital Status: Married    Spouse Name: N/A    Number of Children: N/A  . Years of Education: N/A   Occupational History  . retired     Patent attorney   Social History Main Topics  . Smoking status: Former Smoker --  3.00 packs/day    Start date: 04/17/1952  . Smokeless tobacco: Never  Used     Comment: quit 25 years ago  . Alcohol Use: No  . Drug Use: No  . Sexually Active: No   Other Topics Concern  . Not on file   Social History Narrative   Lives with husband   Is a DNAR   Daughter who is an Rn [phone]    Family Status  Relation Status Death Age  . Mother Deceased     CAD  . Father Deceased     CAD  . Sister Alive     2, alive and well  . Child Alive     3, alive and well    Review of Systems The patient does admit that she does not sleep well at night, taking 3 pills to help her sleep at night.  However, she also admits that she sleeps much of the day.  She does not exercise.  A complete 10 system ROS was obtained and was negative apart from what is mentioned.   Objective:   VITALS:   Filed Vitals:   11/11/12 1455 11/11/12 1532 11/11/12 1534 11/11/12 1535  BP: 94/56 116/68 118/64 96/60  Pulse: 72 64 68 72  Temp: 97.5 F (36.4 C)     Resp: 18     Weight: 130 lb (58.968 kg)      Wt Readings from Last 3 Encounters:  11/11/12 130 lb (58.968 kg)  09/02/12 130 lb 3.2 oz (59.058 kg)  08/08/12 134 lb (60.782 kg)    Gen:  Appears stated age and in NAD. HEENT:  Normocephalic, atraumatic. The mucous membranes are moist. The superficial temporal arteries are without ropiness or tenderness. Cardiovascular: Regular rate and rhythm with 3/6 systolic ejection murmur. Lungs: Clear to auscultation bilaterally. Neck: There are no carotid bruits noted bilaterally.  NEUROLOGICAL:  Orientation:  The patient is alert and oriented x 3.  Recent and remote memory are intact.  Attention span and concentration are normal.  Able to name objects and repeat without trouble.  Fund of knowledge is appropriate Cranial nerves: There is good facial symmetry with the exception of left congenital ptosis. The pupils are pinpoint and nonreactive. Fundoscopic exam is attempted but the disc margins are  not well visualized bilaterally. Extraocular muscles are intact and visual fields are full to confrontational testing. Speech is fluent and clear. Soft palate rises symmetrically and there is no tongue deviation. Hearing is intact to conversational tone. Tone: Tone is good throughout.  There is no cogwheel rigidity Sensation: Sensation is intact to light touch and pinprick throughout (facial, trunk, extremities). Vibration is intact at the bilateral big toe but it is decreased. There is no extinction with double simultaneous stimulation. There is no sensory dermatomal level identified. Coordination:  The patient has no dysdiadichokinesia or dysmetria. Motor: Strength is 5/5 in the bilateral upper and lower extremities.  Shoulder shrug is equal bilaterally.  There is no pronator drift.  There are no fasciculations noted. DTR's: Deep tendon reflexes are 1/4 at the bilateral biceps, triceps, brachioradialis, patella and absent at the bilateral achilles.  Plantar responses are downgoing bilaterally. Gait and Station: The patient is able to ambulate without difficulty. The patient is able to heel toe walk without any difficulty. The patient is able to ambulate in a tandem fashion. The patient is able to stand in the Romberg position.   MOVEMENT EXAM: Tremor:  There is  tremor in the UE, noted most significantly with action.  There is a fine resting tremor to the fingers, but  no pill-rolling quality. The patient is able to draw Archimedes spirals without significant difficulty but there is most certainly evidence of tremor.  There is minimal tremor at rest.         Assessment/Plan:   1. Tremor.  -I suspect that much of this fine tremor that I saw on exam today is due to her antirejection medications.  I discussed this with her today.  She does describe some more discrete episodes in which tremor becomes bad for about 10-15 seconds, and I think that this likely is just an increase in her baseline tremor.   She does not describe myoclonus, which is common in patients who do have renal disease with an increase in her creatinine acutely.  This does not sound like seizure.  -Right now, the patient does not want to be on more medication for tremor.  She feels that her tremor is manageable right now.  I will try to get a copy of her last note from her [facility] transplant team, to see if they did, in fact, want her off of topamax. 2. Falls  -These sound very much related to drops in blood pressure when she gets up.  Her blood pressure was very low in the office today.  I did send a note to her primary care physician asking if she needed to stay on the Coreg or if she just needed a followup appointment with her. 3.  No Follow-up on file.

## 2012-11-11 NOTE — Patient Instructions (Addendum)
Follow up as needed

## 2012-11-12 ENCOUNTER — Telehealth: Payer: Self-pay | Admitting: Neurology

## 2012-11-12 ENCOUNTER — Telehealth: Payer: Self-pay

## 2012-11-12 NOTE — Telephone Encounter (Signed)
Noted! Thank you

## 2012-11-12 NOTE — Telephone Encounter (Signed)
Pt aware.

## 2012-11-12 NOTE — Telephone Encounter (Signed)
Patient will go to [facility] since they have been following.

## 2012-11-12 NOTE — Telephone Encounter (Signed)
[name], please let pt know I talked with PCP about her meds and the pt needs follow up with either Dr. [name] or [facility] regarding the meds and their association with falling.

## 2012-11-20 ENCOUNTER — Encounter: Payer: Self-pay | Admitting: Gastroenterology

## 2012-11-25 ENCOUNTER — Telehealth: Payer: Self-pay

## 2012-11-25 NOTE — Telephone Encounter (Signed)
Pt only requires 1 Pneumonia vaccine after the age of 41.  She should not get another if she just had it

## 2012-11-25 NOTE — Telephone Encounter (Signed)
Patient advised.

## 2012-11-25 NOTE — Telephone Encounter (Signed)
Patient called and stated that [facility] recommended that she receive a second pneumonia vaccine. She just recently received this. Do you want her to have a second vaccine?

## 2012-11-26 ENCOUNTER — Telehealth: Payer: Self-pay

## 2012-11-26 NOTE — Telephone Encounter (Signed)
FYI-[facility] called to advise that patient needs her pneumovax in 8 weeks. They administered the Prevnar, so they request PCP give vaccine.

## 2012-11-27 NOTE — Telephone Encounter (Signed)
Spoke with patient and advised recommendations. Scheduled nurse visit.

## 2012-11-27 NOTE — Telephone Encounter (Signed)
Ok for pt to schedule nurse visit in 8 weeks for pneumovax

## 2012-11-28 ENCOUNTER — Ambulatory Visit: Payer: Medicare Other

## 2012-11-28 ENCOUNTER — Ambulatory Visit (INDEPENDENT_AMBULATORY_CARE_PROVIDER_SITE_OTHER): Payer: Medicare Other | Admitting: *Deleted

## 2012-11-28 DIAGNOSIS — Z23 Encounter for immunization: Secondary | ICD-10-CM

## 2013-01-24 ENCOUNTER — Ambulatory Visit: Payer: Medicare Other

## 2013-02-26 ENCOUNTER — Telehealth: Payer: Self-pay | Admitting: Family Medicine

## 2013-02-26 NOTE — Telephone Encounter (Signed)
Nancy Blair/Spouse Phone [phone] called to request name of sleep aid ordered in past by Dr Scotty Court, her former PCP at [facility] office.  Currently a patient of Dr [name] at [facility].  [place] had a bilateral lung transplant 2 years ago; is having difficulty sleeping.  [facility] physicians are unable to find something that help her to sleep. Reports she is currently asleep.  Asking for name of Rx used in past for insomnia.  Please call back.

## 2013-02-26 NOTE — Telephone Encounter (Signed)
Pt has been on Trazodone in the past

## 2013-02-27 NOTE — Telephone Encounter (Signed)
Pt husband notified 

## 2013-03-04 ENCOUNTER — Telehealth: Payer: Self-pay | Admitting: Family Medicine

## 2013-03-04 ENCOUNTER — Other Ambulatory Visit: Payer: Self-pay | Admitting: Family Medicine

## 2013-03-04 DIAGNOSIS — Z79899 Other long term (current) drug therapy: Secondary | ICD-10-CM

## 2013-03-04 DIAGNOSIS — T86819 Unspecified complication of lung transplant: Secondary | ICD-10-CM

## 2013-03-04 DIAGNOSIS — Z2989 Encounter for other specified prophylactic measures: Secondary | ICD-10-CM

## 2013-03-04 DIAGNOSIS — Z942 Lung transplant status: Secondary | ICD-10-CM

## 2013-03-04 DIAGNOSIS — Z298 Encounter for other specified prophylactic measures: Secondary | ICD-10-CM

## 2013-03-04 NOTE — Telephone Encounter (Signed)
Per Dr. [name], patient is Ok to have weekly labs requested by Dr. Acquanetta Belling at the [facility].  Diagnosis: Status Post Lung Transplant ICD 9: v42.6, 996.84, V07.2, V58.69, 275.2 Comprehensive Metabolic Panel, CBC, Prograf (Tacrolimus) Level, Magnesium Level  Please Fax results to [phone]  Please Page On Call Lung Transplant Coordinator at [phone] with any critical results.

## 2013-03-06 ENCOUNTER — Other Ambulatory Visit (INDEPENDENT_AMBULATORY_CARE_PROVIDER_SITE_OTHER): Payer: Medicare Other

## 2013-03-06 DIAGNOSIS — Z942 Lung transplant status: Secondary | ICD-10-CM

## 2013-03-06 DIAGNOSIS — Z298 Encounter for other specified prophylactic measures: Secondary | ICD-10-CM

## 2013-03-06 DIAGNOSIS — Z79899 Other long term (current) drug therapy: Secondary | ICD-10-CM

## 2013-03-06 DIAGNOSIS — T86819 Unspecified complication of lung transplant: Secondary | ICD-10-CM

## 2013-03-06 LAB — CBC WITH DIFFERENTIAL/PLATELET
Basophils Absolute: 0.1 10*3/uL (ref 0.0–0.1)
HCT: 30.1 % — ABNORMAL LOW (ref 36.0–46.0)
Hemoglobin: 10.1 g/dL — ABNORMAL LOW (ref 12.0–15.0)
Lymphs Abs: 1.2 10*3/uL (ref 0.7–4.0)
MCV: 98.2 fl (ref 78.0–100.0)
Monocytes Absolute: 0.6 10*3/uL (ref 0.1–1.0)
Neutro Abs: 11.7 10*3/uL — ABNORMAL HIGH (ref 1.4–7.7)
Neutrophils Relative %: 85.6 % — ABNORMAL HIGH (ref 43.0–77.0)
Platelets: 233 10*3/uL (ref 150.0–400.0)
RDW: 16.3 % — ABNORMAL HIGH (ref 11.5–14.6)

## 2013-03-06 LAB — COMPREHENSIVE METABOLIC PANEL
ALT: 19 U/L (ref 0–35)
AST: 24 U/L (ref 0–37)
BUN: 36 mg/dL — ABNORMAL HIGH (ref 6–23)
CO2: 27 mEq/L (ref 19–32)
Calcium: 9.3 mg/dL (ref 8.4–10.5)
Creatinine, Ser: 2.1 mg/dL — ABNORMAL HIGH (ref 0.4–1.2)
GFR: 24.38 mL/min — ABNORMAL LOW (ref >60.00)
Potassium: 4 mEq/L (ref 3.5–5.1)
Total Bilirubin: 0.6 mg/dL (ref 0.3–1.2)

## 2013-03-06 LAB — MAGNESIUM: Magnesium: 2.2 mg/dL (ref 1.5–2.5)

## 2013-03-07 LAB — TACROLIMUS LEVEL: Tacrolimus Lvl: <2 ng/mL — ABNORMAL LOW (ref 5.0–20.0)

## 2013-03-14 ENCOUNTER — Other Ambulatory Visit (INDEPENDENT_AMBULATORY_CARE_PROVIDER_SITE_OTHER): Payer: Medicare Other

## 2013-03-14 DIAGNOSIS — Z942 Lung transplant status: Secondary | ICD-10-CM

## 2013-03-14 DIAGNOSIS — Z79899 Other long term (current) drug therapy: Secondary | ICD-10-CM

## 2013-03-14 DIAGNOSIS — Z2989 Encounter for other specified prophylactic measures: Secondary | ICD-10-CM

## 2013-03-14 DIAGNOSIS — T86819 Unspecified complication of lung transplant: Secondary | ICD-10-CM

## 2013-03-14 DIAGNOSIS — Z298 Encounter for other specified prophylactic measures: Secondary | ICD-10-CM

## 2013-03-14 LAB — COMPREHENSIVE METABOLIC PANEL
Albumin: 3.4 g/dL — ABNORMAL LOW (ref 3.5–5.2)
Alkaline Phosphatase: 41 U/L (ref 39–117)
BUN: 47 mg/dL — ABNORMAL HIGH (ref 6–23)
CO2: 28 mEq/L (ref 19–32)
Calcium: 9.7 mg/dL (ref 8.4–10.5)
Chloride: 101 mEq/L (ref 96–112)
GFR: 22.18 mL/min — ABNORMAL LOW (ref >60.00)
Glucose, Bld: 90 mg/dL (ref 70–99)
Potassium: 3.2 mEq/L — ABNORMAL LOW (ref 3.5–5.1)
Sodium: 137 mEq/L (ref 135–145)
Total Protein: 6.2 g/dL (ref 6.0–8.3)

## 2013-03-14 LAB — CBC WITH DIFFERENTIAL/PLATELET
Basophils Relative: 0.4 % (ref 0.0–3.0)
Eosinophils Absolute: 0 10*3/uL (ref 0.0–0.7)
Eosinophils Relative: 0.3 % (ref 0.0–5.0)
Hemoglobin: 10.4 g/dL — ABNORMAL LOW (ref 12.0–15.0)
Lymphocytes Relative: 10.1 % — ABNORMAL LOW (ref 12.0–46.0)
MCHC: 33.3 g/dL (ref 30.0–36.0)
Neutro Abs: 9.8 10*3/uL — ABNORMAL HIGH (ref 1.4–7.7)
Neutrophils Relative %: 82.5 % — ABNORMAL HIGH (ref 43.0–77.0)
RBC: 3.18 Mil/uL — ABNORMAL LOW (ref 3.87–5.11)
RDW: 16 % — ABNORMAL HIGH (ref 11.5–14.6)
WBC: 11.9 10*3/uL — ABNORMAL HIGH (ref 4.5–10.5)

## 2013-04-02 ENCOUNTER — Other Ambulatory Visit: Payer: Self-pay | Admitting: Family Medicine

## 2013-04-02 DIAGNOSIS — Z79899 Other long term (current) drug therapy: Secondary | ICD-10-CM

## 2013-04-02 DIAGNOSIS — Z942 Lung transplant status: Secondary | ICD-10-CM

## 2013-04-02 DIAGNOSIS — Z298 Encounter for other specified prophylactic measures: Secondary | ICD-10-CM

## 2013-04-02 DIAGNOSIS — T86819 Unspecified complication of lung transplant: Secondary | ICD-10-CM

## 2013-05-06 ENCOUNTER — Telehealth: Payer: Self-pay | Admitting: General Practice

## 2013-05-06 NOTE — Telephone Encounter (Signed)
Message on Triage Line: Pt called to schedule lab appt. Was called back and message left to contact office at [phone] and press option for scheduler.

## 2013-05-15 ENCOUNTER — Other Ambulatory Visit (INDEPENDENT_AMBULATORY_CARE_PROVIDER_SITE_OTHER): Payer: Medicare Other

## 2013-05-15 DIAGNOSIS — Z298 Encounter for other specified prophylactic measures: Secondary | ICD-10-CM

## 2013-05-15 DIAGNOSIS — Z942 Lung transplant status: Secondary | ICD-10-CM

## 2013-05-15 DIAGNOSIS — T86819 Unspecified complication of lung transplant: Secondary | ICD-10-CM

## 2013-05-15 DIAGNOSIS — Z79899 Other long term (current) drug therapy: Secondary | ICD-10-CM

## 2013-05-15 LAB — COMPREHENSIVE METABOLIC PANEL
ALT: 18 U/L (ref 0–35)
AST: 23 U/L (ref 0–37)
Albumin: 3.4 g/dL — ABNORMAL LOW (ref 3.5–5.2)
Alkaline Phosphatase: 41 U/L (ref 39–117)
BUN: 45 mg/dL — ABNORMAL HIGH (ref 6–23)
CALCIUM: 8.8 mg/dL (ref 8.4–10.5)
CHLORIDE: 103 meq/L (ref 96–112)
CO2: 26 meq/L (ref 19–32)
CREATININE: 3.2 mg/dL — AB (ref 0.4–1.2)
GFR: 14.99 mL/min — AB (ref >60.00)
Glucose, Bld: 151 mg/dL — ABNORMAL HIGH (ref 70–99)
Potassium: 3.7 mEq/L (ref 3.5–5.1)
Sodium: 138 mEq/L (ref 135–145)
Total Bilirubin: 0.7 mg/dL (ref 0.3–1.2)
Total Protein: 6.1 g/dL (ref 6.0–8.3)

## 2013-05-15 LAB — MAGNESIUM: Magnesium: 2.4 mg/dL (ref 1.5–2.5)

## 2013-05-16 LAB — CBC WITH DIFFERENTIAL/PLATELET
BASOS PCT: 0.7 % (ref 0.0–3.0)
Basophils Absolute: 0.1 10*3/uL (ref 0.0–0.1)
EOS PCT: 0.8 % (ref 0.0–5.0)
Eosinophils Absolute: 0.1 10*3/uL (ref 0.0–0.7)
HCT: 32.7 % — ABNORMAL LOW (ref 36.0–46.0)
Hemoglobin: 10.6 g/dL — ABNORMAL LOW (ref 12.0–15.0)
LYMPHS PCT: 7.8 % — AB (ref 12.0–46.0)
Lymphs Abs: 0.8 10*3/uL (ref 0.7–4.0)
MCHC: 32.3 g/dL (ref 30.0–36.0)
MCV: 103.2 fl — ABNORMAL HIGH (ref 78.0–100.0)
Monocytes Absolute: 0 10*3/uL — ABNORMAL LOW (ref 0.1–1.0)
Monocytes Relative: 0.3 % — ABNORMAL LOW (ref 3.0–12.0)
Neutro Abs: 9 10*3/uL — ABNORMAL HIGH (ref 1.4–7.7)
Platelets: 205 10*3/uL (ref 150.0–400.0)
RBC: 3.17 Mil/uL — ABNORMAL LOW (ref 3.87–5.11)
RDW: 15.8 % — ABNORMAL HIGH (ref 11.5–14.6)
WBC: 9.9 10*3/uL (ref 4.5–10.5)

## 2013-05-16 LAB — CYCLOSPORINE LEVEL: Cyclosporine, Blood: 457 ng/mL — ABNORMAL HIGH (ref 140–330)

## 2013-05-26 ENCOUNTER — Other Ambulatory Visit (INDEPENDENT_AMBULATORY_CARE_PROVIDER_SITE_OTHER): Payer: Medicare Other

## 2013-05-26 DIAGNOSIS — T86819 Unspecified complication of lung transplant: Secondary | ICD-10-CM

## 2013-05-26 DIAGNOSIS — Z942 Lung transplant status: Secondary | ICD-10-CM

## 2013-05-26 DIAGNOSIS — Z298 Encounter for other specified prophylactic measures: Secondary | ICD-10-CM

## 2013-05-26 DIAGNOSIS — Z79899 Other long term (current) drug therapy: Secondary | ICD-10-CM

## 2013-05-26 LAB — CBC WITH DIFFERENTIAL/PLATELET
BASOS PCT: 0.3 % (ref 0.0–3.0)
Basophils Absolute: 0 10*3/uL (ref 0.0–0.1)
EOS ABS: 0.1 10*3/uL (ref 0.0–0.7)
EOS PCT: 1 % (ref 0.0–5.0)
HCT: 33.1 % — ABNORMAL LOW (ref 36.0–46.0)
Hemoglobin: 10.5 g/dL — ABNORMAL LOW (ref 12.0–15.0)
Lymphocytes Relative: 16.2 % (ref 12.0–46.0)
Lymphs Abs: 1.5 10*3/uL (ref 0.7–4.0)
MCHC: 31.8 g/dL (ref 30.0–36.0)
MCV: 103.8 fl — AB (ref 78.0–100.0)
MONO ABS: 0.5 10*3/uL (ref 0.1–1.0)
Monocytes Relative: 5.8 % (ref 3.0–12.0)
NEUTROS ABS: 7.2 10*3/uL (ref 1.4–7.7)
Neutrophils Relative %: 76.7 % (ref 43.0–77.0)
Platelets: 206 10*3/uL (ref 150.0–400.0)
RBC: 3.19 Mil/uL — AB (ref 3.87–5.11)
RDW: 15.5 % — ABNORMAL HIGH (ref 11.5–14.6)
WBC: 9.4 10*3/uL (ref 4.5–10.5)

## 2013-05-26 LAB — COMPREHENSIVE METABOLIC PANEL
ALBUMIN: 3.3 g/dL — AB (ref 3.5–5.2)
ALK PHOS: 33 U/L — AB (ref 39–117)
ALT: 12 U/L (ref 0–35)
AST: 18 U/L (ref 0–37)
BUN: 51 mg/dL — AB (ref 6–23)
CO2: 25 meq/L (ref 19–32)
Calcium: 8.9 mg/dL (ref 8.4–10.5)
Chloride: 104 mEq/L (ref 96–112)
Creatinine, Ser: 2.6 mg/dL — ABNORMAL HIGH (ref 0.4–1.2)
GFR: 18.88 mL/min — ABNORMAL LOW (ref >60.00)
Glucose, Bld: 93 mg/dL (ref 70–99)
Potassium: 3.8 mEq/L (ref 3.5–5.1)
Sodium: 138 mEq/L (ref 135–145)
Total Bilirubin: 0.6 mg/dL (ref 0.3–1.2)
Total Protein: 6 g/dL (ref 6.0–8.3)

## 2013-05-26 LAB — MAGNESIUM: Magnesium: 2.3 mg/dL (ref 1.5–2.5)

## 2013-05-27 LAB — CYCLOSPORINE LEVEL: Cyclosporine, Blood: <30 ng/mL — ABNORMAL LOW (ref 140–330)

## 2013-06-11 ENCOUNTER — Other Ambulatory Visit: Payer: Self-pay | Admitting: Family Medicine

## 2013-06-11 NOTE — Telephone Encounter (Signed)
Med filled.  

## 2013-07-08 ENCOUNTER — Telehealth: Payer: Self-pay | Admitting: *Deleted

## 2013-07-08 NOTE — Telephone Encounter (Signed)
Pt called to schedule lab appt for this Friday, 07/11/13.  Pt states she is supposed to have labs drawn every couple weeks.  There are no lab orders in her chart at this time.  Pt is scheduled for Lab appt Friday 07/11/2013 at 11am.  bw

## 2013-07-09 NOTE — Telephone Encounter (Signed)
These are already on order.

## 2013-07-11 ENCOUNTER — Other Ambulatory Visit (INDEPENDENT_AMBULATORY_CARE_PROVIDER_SITE_OTHER): Payer: Medicare Other

## 2013-07-11 DIAGNOSIS — Z298 Encounter for other specified prophylactic measures: Secondary | ICD-10-CM

## 2013-07-11 DIAGNOSIS — Z942 Lung transplant status: Secondary | ICD-10-CM

## 2013-07-11 DIAGNOSIS — T86819 Unspecified complication of lung transplant: Secondary | ICD-10-CM

## 2013-07-11 DIAGNOSIS — Z79899 Other long term (current) drug therapy: Secondary | ICD-10-CM

## 2013-07-11 LAB — COMPREHENSIVE METABOLIC PANEL
ALT: 18 U/L (ref 0–35)
AST: 21 U/L (ref 0–37)
Albumin: 3.3 g/dL — ABNORMAL LOW (ref 3.5–5.2)
Alkaline Phosphatase: 46 U/L (ref 39–117)
BUN: 53 mg/dL — ABNORMAL HIGH (ref 6–23)
CALCIUM: 8.9 mg/dL (ref 8.4–10.5)
CHLORIDE: 104 meq/L (ref 96–112)
CO2: 24 meq/L (ref 19–32)
CREATININE: 2.9 mg/dL — AB (ref 0.4–1.2)
GFR: 16.59 mL/min — AB (ref >60.00)
Glucose, Bld: 92 mg/dL (ref 70–99)
Potassium: 4.2 mEq/L (ref 3.5–5.1)
Sodium: 138 mEq/L (ref 135–145)
Total Bilirubin: 0.7 mg/dL (ref 0.3–1.2)
Total Protein: 5.9 g/dL — ABNORMAL LOW (ref 6.0–8.3)

## 2013-07-11 LAB — CBC WITH DIFFERENTIAL/PLATELET
Basophils Absolute: 0 10*3/uL (ref 0.0–0.1)
Basophils Relative: 0.4 % (ref 0.0–3.0)
Eosinophils Absolute: 0.1 10*3/uL (ref 0.0–0.7)
Eosinophils Relative: 0.7 % (ref 0.0–5.0)
HEMATOCRIT: 32 % — AB (ref 36.0–46.0)
HEMOGLOBIN: 10.7 g/dL — AB (ref 12.0–15.0)
LYMPHS ABS: 1.7 10*3/uL (ref 0.7–4.0)
Lymphocytes Relative: 17.3 % (ref 12.0–46.0)
MCHC: 33.4 g/dL (ref 30.0–36.0)
MCV: 101.1 fl — ABNORMAL HIGH (ref 78.0–100.0)
MONO ABS: 0.5 10*3/uL (ref 0.1–1.0)
Monocytes Relative: 4.7 % (ref 3.0–12.0)
NEUTROS ABS: 7.6 10*3/uL (ref 1.4–7.7)
Neutrophils Relative %: 76.9 % (ref 43.0–77.0)
Platelets: 225 10*3/uL (ref 150.0–400.0)
RBC: 3.16 Mil/uL — ABNORMAL LOW (ref 3.87–5.11)
RDW: 14.7 % — ABNORMAL HIGH (ref 11.5–14.6)
WBC: 9.9 10*3/uL (ref 4.5–10.5)

## 2013-07-11 LAB — MAGNESIUM: Magnesium: 2.3 mg/dL (ref 1.5–2.5)

## 2013-07-13 LAB — CYCLOSPORINE LEVEL: Cyclosporine, Blood: 1130 ng/mL — ABNORMAL HIGH (ref 140–330)

## 2013-07-14 ENCOUNTER — Telehealth: Payer: Self-pay

## 2013-07-14 NOTE — Telephone Encounter (Signed)
Nancy Blair from Nancy Blair called to report Cyclosporine Level of 1130 (HIGH).  Please advise.

## 2013-07-14 NOTE — Telephone Encounter (Signed)
Labs were faxed to [facility] transplant (Dr. Doy Mince) this am

## 2013-07-14 NOTE — Telephone Encounter (Signed)
Fax labs to [facility] for ongoing management

## 2013-07-16 ENCOUNTER — Encounter (HOSPITAL_COMMUNITY): Payer: Self-pay | Admitting: Emergency Medicine

## 2013-07-16 ENCOUNTER — Observation Stay (HOSPITAL_COMMUNITY)
Admission: EM | Admit: 2013-07-16 | Discharge: 2013-07-21 | Disposition: A | Discharge status: Home / Self Care | Payer: Medicare Other | Attending: Family Medicine | Admitting: Family Medicine

## 2013-07-16 ENCOUNTER — Emergency Department (HOSPITAL_COMMUNITY): Payer: Medicare Other

## 2013-07-16 DIAGNOSIS — I359 Nonrheumatic aortic valve disorder, unspecified: Secondary | ICD-10-CM

## 2013-07-16 DIAGNOSIS — M949 Disorder of cartilage, unspecified: Secondary | ICD-10-CM

## 2013-07-16 DIAGNOSIS — F329 Major depressive disorder, single episode, unspecified: Secondary | ICD-10-CM | POA: Insufficient documentation

## 2013-07-16 DIAGNOSIS — J189 Pneumonia, unspecified organism: Secondary | ICD-10-CM

## 2013-07-16 DIAGNOSIS — I509 Heart failure, unspecified: Secondary | ICD-10-CM | POA: Insufficient documentation

## 2013-07-16 DIAGNOSIS — I5033 Acute on chronic diastolic (congestive) heart failure: Principal | ICD-10-CM

## 2013-07-16 DIAGNOSIS — Z942 Lung transplant status: Secondary | ICD-10-CM

## 2013-07-16 DIAGNOSIS — N179 Acute kidney failure, unspecified: Secondary | ICD-10-CM | POA: Insufficient documentation

## 2013-07-16 DIAGNOSIS — R251 Tremor, unspecified: Secondary | ICD-10-CM

## 2013-07-16 DIAGNOSIS — I5031 Acute diastolic (congestive) heart failure: Secondary | ICD-10-CM

## 2013-07-16 DIAGNOSIS — R42 Dizziness and giddiness: Secondary | ICD-10-CM

## 2013-07-16 DIAGNOSIS — T502X5A Adverse effect of carbonic-anhydrase inhibitors, benzothiadiazides and other diuretics, initial encounter: Secondary | ICD-10-CM | POA: Insufficient documentation

## 2013-07-16 DIAGNOSIS — L089 Local infection of the skin and subcutaneous tissue, unspecified: Secondary | ICD-10-CM

## 2013-07-16 DIAGNOSIS — R252 Cramp and spasm: Secondary | ICD-10-CM

## 2013-07-16 DIAGNOSIS — M899 Disorder of bone, unspecified: Secondary | ICD-10-CM

## 2013-07-16 DIAGNOSIS — R0902 Hypoxemia: Secondary | ICD-10-CM | POA: Diagnosis present

## 2013-07-16 DIAGNOSIS — R7989 Other specified abnormal findings of blood chemistry: Secondary | ICD-10-CM

## 2013-07-16 DIAGNOSIS — R5383 Other fatigue: Secondary | ICD-10-CM

## 2013-07-16 DIAGNOSIS — I951 Orthostatic hypotension: Secondary | ICD-10-CM

## 2013-07-16 DIAGNOSIS — K2970 Gastritis, unspecified, without bleeding: Secondary | ICD-10-CM

## 2013-07-16 DIAGNOSIS — Z87891 Personal history of nicotine dependence: Secondary | ICD-10-CM

## 2013-07-16 DIAGNOSIS — I959 Hypotension, unspecified: Secondary | ICD-10-CM | POA: Insufficient documentation

## 2013-07-16 DIAGNOSIS — E785 Hyperlipidemia, unspecified: Secondary | ICD-10-CM

## 2013-07-16 DIAGNOSIS — M25559 Pain in unspecified hip: Secondary | ICD-10-CM

## 2013-07-16 DIAGNOSIS — K219 Gastro-esophageal reflux disease without esophagitis: Secondary | ICD-10-CM | POA: Insufficient documentation

## 2013-07-16 DIAGNOSIS — E559 Vitamin D deficiency, unspecified: Secondary | ICD-10-CM

## 2013-07-16 DIAGNOSIS — T50995A Adverse effect of other drugs, medicaments and biological substances, initial encounter: Secondary | ICD-10-CM

## 2013-07-16 DIAGNOSIS — Z79899 Other long term (current) drug therapy: Secondary | ICD-10-CM | POA: Insufficient documentation

## 2013-07-16 DIAGNOSIS — J9801 Acute bronchospasm: Secondary | ICD-10-CM

## 2013-07-16 DIAGNOSIS — R05 Cough: Secondary | ICD-10-CM | POA: Insufficient documentation

## 2013-07-16 DIAGNOSIS — J9600 Acute respiratory failure, unspecified whether with hypoxia or hypercapnia: Secondary | ICD-10-CM

## 2013-07-16 DIAGNOSIS — R350 Frequency of micturition: Secondary | ICD-10-CM

## 2013-07-16 DIAGNOSIS — E119 Type 2 diabetes mellitus without complications: Secondary | ICD-10-CM | POA: Insufficient documentation

## 2013-07-16 DIAGNOSIS — F341 Dysthymic disorder: Secondary | ICD-10-CM

## 2013-07-16 DIAGNOSIS — J90 Pleural effusion, not elsewhere classified: Secondary | ICD-10-CM | POA: Insufficient documentation

## 2013-07-16 DIAGNOSIS — I10 Essential (primary) hypertension: Secondary | ICD-10-CM

## 2013-07-16 DIAGNOSIS — I2510 Atherosclerotic heart disease of native coronary artery without angina pectoris: Secondary | ICD-10-CM | POA: Insufficient documentation

## 2013-07-16 DIAGNOSIS — N184 Chronic kidney disease, stage 4 (severe): Secondary | ICD-10-CM

## 2013-07-16 DIAGNOSIS — D72829 Elevated white blood cell count, unspecified: Secondary | ICD-10-CM | POA: Insufficient documentation

## 2013-07-16 DIAGNOSIS — Z7982 Long term (current) use of aspirin: Secondary | ICD-10-CM | POA: Insufficient documentation

## 2013-07-16 DIAGNOSIS — J9601 Acute respiratory failure with hypoxia: Secondary | ICD-10-CM

## 2013-07-16 DIAGNOSIS — I129 Hypertensive chronic kidney disease with stage 1 through stage 4 chronic kidney disease, or unspecified chronic kidney disease: Secondary | ICD-10-CM | POA: Insufficient documentation

## 2013-07-16 DIAGNOSIS — C44721 Squamous cell carcinoma of skin of unspecified lower limb, including hip: Secondary | ICD-10-CM

## 2013-07-16 DIAGNOSIS — R059 Cough, unspecified: Secondary | ICD-10-CM | POA: Insufficient documentation

## 2013-07-16 DIAGNOSIS — Z9089 Acquired absence of other organs: Secondary | ICD-10-CM | POA: Insufficient documentation

## 2013-07-16 DIAGNOSIS — D638 Anemia in other chronic diseases classified elsewhere: Secondary | ICD-10-CM | POA: Insufficient documentation

## 2013-07-16 DIAGNOSIS — R0602 Shortness of breath: Secondary | ICD-10-CM | POA: Diagnosis present

## 2013-07-16 DIAGNOSIS — I5032 Chronic diastolic (congestive) heart failure: Secondary | ICD-10-CM

## 2013-07-16 DIAGNOSIS — D649 Anemia, unspecified: Secondary | ICD-10-CM

## 2013-07-16 DIAGNOSIS — E039 Hypothyroidism, unspecified: Secondary | ICD-10-CM

## 2013-07-16 DIAGNOSIS — Z90710 Acquired absence of both cervix and uterus: Secondary | ICD-10-CM | POA: Insufficient documentation

## 2013-07-16 DIAGNOSIS — I350 Nonrheumatic aortic (valve) stenosis: Secondary | ICD-10-CM

## 2013-07-16 DIAGNOSIS — E876 Hypokalemia: Secondary | ICD-10-CM

## 2013-07-16 DIAGNOSIS — T148XXA Other injury of unspecified body region, initial encounter: Secondary | ICD-10-CM

## 2013-07-16 DIAGNOSIS — F3289 Other specified depressive episodes: Secondary | ICD-10-CM | POA: Insufficient documentation

## 2013-07-16 DIAGNOSIS — R55 Syncope and collapse: Secondary | ICD-10-CM

## 2013-07-16 HISTORY — DX: Acute on chronic diastolic (congestive) heart failure: I50.33

## 2013-07-16 LAB — CBC
HCT: 33.4 % — ABNORMAL LOW (ref 36.0–46.0)
HCT: 32.7 % — ABNORMAL LOW (ref 36.0–46.0)
HEMOGLOBIN: 10.8 g/dL — AB (ref 12.0–15.0)
HEMOGLOBIN: 10.5 g/dL — AB (ref 12.0–15.0)
MCH: 33 pg (ref 26.0–34.0)
MCH: 33.1 pg (ref 26.0–34.0)
MCHC: 32.3 g/dL (ref 30.0–36.0)
MCHC: 32.1 g/dL (ref 30.0–36.0)
MCV: 102.1 fL — ABNORMAL HIGH (ref 78.0–100.0)
MCV: 103.2 fL — AB (ref 78.0–100.0)
PLATELETS: 202 10*3/uL (ref 150–400)
Platelets: 193 10*3/uL (ref 150–400)
RBC: 3.27 MIL/uL — ABNORMAL LOW (ref 3.87–5.11)
RBC: 3.17 MIL/uL — ABNORMAL LOW (ref 3.87–5.11)
RDW: 14.6 % (ref 11.5–15.5)
RDW: 14.4 % (ref 11.5–15.5)
WBC: 11.3 10*3/uL — ABNORMAL HIGH (ref 4.0–10.5)
WBC: 11.1 10*3/uL — AB (ref 4.0–10.5)

## 2013-07-16 LAB — BASIC METABOLIC PANEL
BUN: 52 mg/dL — AB (ref 6–23)
BUN: 65 mg/dL — ABNORMAL HIGH (ref 6–23)
CALCIUM: 9.3 mg/dL (ref 8.4–10.5)
CHLORIDE: 100 meq/L (ref 96–112)
CO2: 20 mEq/L (ref 19–32)
CO2: 24 meq/L (ref 19–32)
Calcium: 9.4 mg/dL (ref 8.4–10.5)
Chloride: 100 mEq/L (ref 96–112)
Creatinine, Ser: 2.54 mg/dL — ABNORMAL HIGH (ref 0.50–1.10)
Creatinine, Ser: 3.26 mg/dL — ABNORMAL HIGH (ref 0.50–1.10)
GFR calc non Af Amer: 13 mL/min — ABNORMAL LOW (ref >90)
GFR, EST AFRICAN AMERICAN: 20 mL/min — AB (ref >90)
GFR, EST AFRICAN AMERICAN: 15 mL/min — AB (ref >90)
GFR, EST NON AFRICAN AMERICAN: 17 mL/min — AB (ref >90)
Glucose, Bld: 137 mg/dL — ABNORMAL HIGH (ref 70–99)
Glucose, Bld: 133 mg/dL — ABNORMAL HIGH (ref 70–99)
Potassium: 3.9 mEq/L (ref 3.7–5.3)
Potassium: 5.1 mEq/L (ref 3.7–5.3)
Sodium: 137 mEq/L (ref 137–147)
Sodium: 138 mEq/L (ref 137–147)

## 2013-07-16 LAB — HEMOGLOBIN A1C
HEMOGLOBIN A1C: 5.8 % — AB (ref <5.7)
Mean Plasma Glucose: 120 mg/dL — ABNORMAL HIGH (ref <117)

## 2013-07-16 LAB — GLUCOSE, CAPILLARY
GLUCOSE-CAPILLARY: 289 mg/dL — AB (ref 70–99)
GLUCOSE-CAPILLARY: 130 mg/dL — AB (ref 70–99)

## 2013-07-16 LAB — I-STAT TROPONIN, ED: Troponin i, poc: 0.06 ng/mL (ref 0.00–0.08)

## 2013-07-16 LAB — PROCALCITONIN

## 2013-07-16 LAB — MAGNESIUM: MAGNESIUM: 2.2 mg/dL (ref 1.5–2.5)

## 2013-07-16 LAB — STREP PNEUMONIAE URINARY ANTIGEN: Strep Pneumo Urinary Antigen: NEGATIVE

## 2013-07-16 LAB — PRO B NATRIURETIC PEPTIDE: PRO B NATRI PEPTIDE: 26763 pg/mL — AB (ref 0–450)

## 2013-07-16 MED ORDER — HYDROCORTISONE 20 MG PO TABS
20.0000 mg | ORAL_TABLET | Freq: Two times a day (BID) | ORAL | Status: DC
  Administered 2013-07-16 – 2013-07-21 (×10): 20 mg via ORAL
  Filled 2013-07-16 (×11): qty 1

## 2013-07-16 MED ORDER — FUROSEMIDE 40 MG PO TABS
40.0000 mg | ORAL_TABLET | ORAL | Status: DC
  Administered 2013-07-18: 40 mg via ORAL
  Filled 2013-07-16 (×2): qty 1

## 2013-07-16 MED ORDER — INSULIN ASPART 100 UNIT/ML ~~LOC~~ SOLN
0.0000 [IU] | Freq: Three times a day (TID) | SUBCUTANEOUS | Status: DC
Start: 2013-07-16 — End: 2013-07-21
  Administered 2013-07-16: 5 [IU] via SUBCUTANEOUS
  Administered 2013-07-17: 2 [IU] via SUBCUTANEOUS
  Administered 2013-07-17 (×2): 1 [IU] via SUBCUTANEOUS
  Administered 2013-07-18: 2 [IU] via SUBCUTANEOUS
  Administered 2013-07-19: 0 [IU] via SUBCUTANEOUS
  Administered 2013-07-19: 2 [IU] via SUBCUTANEOUS
  Administered 2013-07-20: 1 [IU] via SUBCUTANEOUS
  Administered 2013-07-20: 2 [IU] via SUBCUTANEOUS
  Administered 2013-07-21: 1 [IU] via SUBCUTANEOUS

## 2013-07-16 MED ORDER — ACETAMINOPHEN 325 MG PO TABS
650.0000 mg | ORAL_TABLET | Freq: Four times a day (QID) | ORAL | Status: DC | PRN
  Administered 2013-07-17 – 2013-07-20 (×2): 650 mg via ORAL
  Filled 2013-07-16 (×2): qty 2

## 2013-07-16 MED ORDER — ASPIRIN 81 MG PO CHEW
81.0000 mg | CHEWABLE_TABLET | Freq: Every day | ORAL | Status: DC
  Administered 2013-07-17 – 2013-07-21 (×5): 81 mg via ORAL
  Filled 2013-07-16 (×5): qty 1

## 2013-07-16 MED ORDER — INSULIN ASPART 100 UNIT/ML ~~LOC~~ SOLN: 10.0000 [IU] | Freq: Once | SUBCUTANEOUS | Status: DC

## 2013-07-16 MED ORDER — CYCLOSPORINE MODIFIED (NEORAL) 25 MG PO CAPS
50.0000 mg | ORAL_CAPSULE | Freq: Every day | ORAL | Status: DC
  Administered 2013-07-16 – 2013-07-20 (×5): 50 mg via ORAL
  Filled 2013-07-16 (×6): qty 2

## 2013-07-16 MED ORDER — VALGANCICLOVIR HCL 450 MG PO TABS
450.0000 mg | ORAL_TABLET | ORAL | Status: DC
  Administered 2013-07-16 – 2013-07-21 (×3): 450 mg via ORAL
  Filled 2013-07-16 (×5): qty 1

## 2013-07-16 MED ORDER — MYCOPHENOLATE MOFETIL 250 MG PO CAPS
500.0000 mg | ORAL_CAPSULE | Freq: Two times a day (BID) | ORAL | Status: DC
  Administered 2013-07-16 – 2013-07-21 (×10): 500 mg via ORAL
  Filled 2013-07-16 (×11): qty 2

## 2013-07-16 MED ORDER — HYDRALAZINE HCL 20 MG/ML IJ SOLN: 5.0000 mg | Freq: Four times a day (QID) | INTRAMUSCULAR | Status: DC | PRN

## 2013-07-16 MED ORDER — MAGNESIUM OXIDE 400 MG PO TABS: 400.0000 mg | ORAL_TABLET | Freq: Every day | ORAL | Status: DC

## 2013-07-16 MED ORDER — FERROUS SULFATE 325 (65 FE) MG PO TABS
325.0000 mg | ORAL_TABLET | Freq: Every day | ORAL | Status: DC
  Administered 2013-07-17 – 2013-07-21 (×5): 325 mg via ORAL
  Filled 2013-07-16 (×6): qty 1

## 2013-07-16 MED ORDER — VITAMIN D-3 25 MCG (1000 UT) PO CAPS: 1.0000 | ORAL_CAPSULE | Freq: Every day | ORAL | Status: DC

## 2013-07-16 MED ORDER — SODIUM CHLORIDE 0.9 % IV BOLUS (SEPSIS): 1000.0000 mL | Freq: Once | INTRAVENOUS | Status: DC

## 2013-07-16 MED ORDER — IPRATROPIUM BROMIDE 0.02 % IN SOLN
0.5000 mg | Freq: Once | RESPIRATORY_TRACT | Status: AC
  Administered 2013-07-16: 0.5 mg via RESPIRATORY_TRACT
  Filled 2013-07-16: qty 2.5

## 2013-07-16 MED ORDER — PRAVASTATIN SODIUM 20 MG PO TABS
20.0000 mg | ORAL_TABLET | Freq: Every day | ORAL | Status: DC
  Administered 2013-07-16 – 2013-07-20 (×5): 20 mg via ORAL
  Filled 2013-07-16 (×6): qty 1

## 2013-07-16 MED ORDER — MAGNESIUM OXIDE 400 (241.3 MG) MG PO TABS
400.0000 mg | ORAL_TABLET | Freq: Every day | ORAL | Status: DC
  Administered 2013-07-16 – 2013-07-21 (×6): 400 mg via ORAL
  Filled 2013-07-16 (×6): qty 1

## 2013-07-16 MED ORDER — ONDANSETRON HCL 4 MG/2ML IJ SOLN: 4.0000 mg | Freq: Four times a day (QID) | INTRAMUSCULAR | Status: DC | PRN

## 2013-07-16 MED ORDER — LEVOFLOXACIN 500 MG PO TABS
500.0000 mg | ORAL_TABLET | Freq: Once | ORAL | Status: AC
  Administered 2013-07-16: 500 mg via ORAL
  Filled 2013-07-16: qty 1

## 2013-07-16 MED ORDER — SIMVASTATIN 10 MG PO TABS: 10.0000 mg | ORAL_TABLET | Freq: Every day | ORAL | Status: DC

## 2013-07-16 MED ORDER — SULFAMETHOXAZOLE-TRIMETHOPRIM 400-80 MG PO TABS
1.0000 | ORAL_TABLET | ORAL | Status: DC
  Administered 2013-07-18 – 2013-07-21 (×2): 1 via ORAL
  Filled 2013-07-16 (×2): qty 1

## 2013-07-16 MED ORDER — SODIUM CHLORIDE 0.9 % IV BOLUS (SEPSIS)
1000.0000 mL | Freq: Once | INTRAVENOUS | Status: DC
Start: 2013-07-16 — End: 2013-07-16

## 2013-07-16 MED ORDER — FUROSEMIDE 10 MG/ML IJ SOLN
40.0000 mg | Freq: Once | INTRAMUSCULAR | Status: AC
  Administered 2013-07-16: 40 mg via INTRAVENOUS
  Filled 2013-07-16: qty 4

## 2013-07-16 MED ORDER — CITALOPRAM HYDROBROMIDE 20 MG PO TABS
20.0000 mg | ORAL_TABLET | Freq: Every day | ORAL | Status: DC
  Administered 2013-07-16 – 2013-07-20 (×5): 20 mg via ORAL
  Filled 2013-07-16 (×7): qty 1

## 2013-07-16 MED ORDER — ALBUTEROL SULFATE (2.5 MG/3ML) 0.083% IN NEBU
2.5000 mg | INHALATION_SOLUTION | RESPIRATORY_TRACT | Status: DC | PRN
  Administered 2013-07-17: 2.5 mg via RESPIRATORY_TRACT
  Filled 2013-07-16: qty 3

## 2013-07-16 MED ORDER — VITAMIN D3 25 MCG (1000 UNIT) PO TABS
1000.0000 [IU] | ORAL_TABLET | Freq: Every day | ORAL | Status: DC
  Administered 2013-07-17 – 2013-07-21 (×5): 1000 [IU] via ORAL
  Filled 2013-07-16 (×6): qty 1

## 2013-07-16 MED ORDER — LEVOFLOXACIN 250 MG PO TABS
250.0000 mg | ORAL_TABLET | ORAL | Status: DC
  Filled 2013-07-16: qty 1

## 2013-07-16 MED ORDER — ALBUTEROL (5 MG/ML) CONTINUOUS INHALATION SOLN
10.0000 mg/h | INHALATION_SOLUTION | Freq: Once | RESPIRATORY_TRACT | Status: AC
  Administered 2013-07-16: 10 mg/h via RESPIRATORY_TRACT
  Filled 2013-07-16: qty 20

## 2013-07-16 MED ORDER — CYCLOSPORINE MODIFIED (NEORAL) 25 MG PO CAPS
75.0000 mg | ORAL_CAPSULE | Freq: Every day | ORAL | Status: DC
  Administered 2013-07-17 – 2013-07-21 (×5): 75 mg via ORAL
  Filled 2013-07-16 (×6): qty 3

## 2013-07-16 MED ORDER — POTASSIUM CHLORIDE CRYS ER 20 MEQ PO TBCR
60.0000 meq | EXTENDED_RELEASE_TABLET | Freq: Once | ORAL | Status: AC
  Administered 2013-07-16: 60 meq via ORAL
  Filled 2013-07-16: qty 3

## 2013-07-16 MED ORDER — LEVOTHYROXINE SODIUM 88 MCG PO TABS
88.0000 ug | ORAL_TABLET | Freq: Every day | ORAL | Status: DC
  Administered 2013-07-17 – 2013-07-21 (×5): 88 ug via ORAL
  Filled 2013-07-16 (×6): qty 1

## 2013-07-16 MED ORDER — ONDANSETRON HCL 4 MG PO TABS: 4.0000 mg | ORAL_TABLET | Freq: Four times a day (QID) | ORAL | Status: DC | PRN

## 2013-07-16 MED ORDER — CARVEDILOL 12.5 MG PO TABS
12.5000 mg | ORAL_TABLET | Freq: Two times a day (BID) | ORAL | Status: DC
  Administered 2013-07-16 – 2013-07-21 (×10): 12.5 mg via ORAL
  Filled 2013-07-16 (×12): qty 1

## 2013-07-16 MED ORDER — ACETAMINOPHEN 650 MG RE SUPP: 650.0000 mg | Freq: Four times a day (QID) | RECTAL | Status: DC | PRN

## 2013-07-16 MED ORDER — HYDRALAZINE HCL 20 MG/ML IJ SOLN
10.0000 mg | Freq: Three times a day (TID) | INTRAMUSCULAR | Status: DC | PRN
  Administered 2013-07-17 – 2013-07-21 (×5): 10 mg via INTRAVENOUS
  Filled 2013-07-16 (×3): qty 0.5

## 2013-07-16 MED ORDER — HEPARIN SODIUM (PORCINE) 5000 UNIT/ML IJ SOLN
5000.0000 [IU] | Freq: Three times a day (TID) | INTRAMUSCULAR | Status: DC
  Administered 2013-07-16 – 2013-07-21 (×14): 5000 [IU] via SUBCUTANEOUS
  Filled 2013-07-16 (×17): qty 1

## 2013-07-16 MED ORDER — POTASSIUM CHLORIDE CRYS ER 20 MEQ PO TBCR
30.0000 meq | EXTENDED_RELEASE_TABLET | Freq: Once | ORAL | Status: AC
  Administered 2013-07-16: 30 meq via ORAL
  Filled 2013-07-16: qty 1

## 2013-07-16 MED ORDER — ADULT MULTIVITAMIN W/MINERALS CH
1.0000 | ORAL_TABLET | Freq: Every day | ORAL | Status: DC
  Administered 2013-07-17 – 2013-07-21 (×4): 1 via ORAL
  Filled 2013-07-16 (×5): qty 1

## 2013-07-16 MED ORDER — ONDANSETRON HCL 4 MG/2ML IJ SOLN: 4.0000 mg | Freq: Once | INTRAMUSCULAR | Status: DC

## 2013-07-16 NOTE — ED Provider Notes (Addendum)
CSN: [id number]     Arrival date & time 07/16/13  6010 History   First MD Initiated Contact with Patient 07/16/13 [phone]     Chief Complaint  Patient presents with  . Shortness of Breath  . Post-op Problem    HPI Patient states last night she started to feel short of breath. This was around 7 PM. Yesterday during the day she had a routine bronchoscopy procedure performed at [facility]. Patient has history of a double lung transplant around 3 years ago. The transplant was performed for severe COPD. Patient has a remote smoking history. She has not had any complications and has had no difficulties with her breathing since the transplant. The bronchoscopy yesterday was performed as part of her routine surveillance. She's had maybe 15 bronchoscopies  in the past and has never had any issues or complications. Her shortness of breath became more severe this morning. Nothing seems to make it better or worse specifically she does not feel worse lying flat.  She has not noticed any trouble with leg swelling. She's not having any chest pain. She denies any fevers. She has had a nonproductive cough since her breathing issues started.     No history of CHF or PE. Past Medical History  Diagnosis Date  . Emphysema of lung   . Hypertension   . Hyperlipidemia   . Allergy   . Anemia   . GERD (gastroesophageal reflux disease)   . Adrenal insufficiency   . Squamous acanthoma of skin 06/2011    rt leg  . Hypothyroidism   . CKD (chronic kidney disease) stage 4, GFR 15-29 ml/min   . Lung transplant status, bilateral    Past Surgical History  Procedure Laterality Date  . Appendectomy    . Abdominal hysterectomy    . Tonsillectomy    . Total lung replacement  2012    b/l lung transplant  . Esophagogastroduodenoscopy  05/26/2011    Procedure: ESOPHAGOGASTRODUODENOSCOPY (EGD);  Surgeon: Owens Loffler, MD;  Location: [facility];  Service: Endoscopy;  Laterality: N/A;   Family History  Problem Relation Age of Onset   . Heart disease Mother   . Heart disease Father   . Pancreatic cancer Sister   . Colon cancer Neg Hx   . Malignant hyperthermia Neg Hx   . Hypertension Mother   . Hypertension Father    History  Substance Use Topics  . Smoking status: Former Smoker -- 3.00 packs/day    Start date: 04/17/1952  . Smokeless tobacco: Never Used     Comment: quit 25 years ago  . Alcohol Use: No   OB History   Grav Para Term Preterm Abortions TAB SAB Ect Mult Living                 Review of Systems  Constitutional: Negative for fever.  Respiratory: Negative for choking.   Cardiovascular: Negative for chest pain.  All other systems reviewed and are negative.      Allergies  Grapefruit extract; Oysters; Pineapple; Bupropion; Neomycin-bacitracin zn-polymyx; Penicillins; Prednisone; and Septra  Home Medications   Current Outpatient Rx  Name  Route  Sig  Dispense  Refill  . aspirin 81 MG chewable tablet   Oral   Chew 81 mg by mouth daily.         . Calcium Carbonate-Vitamin D (CALCIUM 600 + D PO)   Oral   Take 2 tablets by mouth 2 (two) times daily.          Marland Kitchen  carvedilol (COREG) 6.25 MG tablet   Oral   Take 1 tablet (6.25 mg total) by mouth 2 (two) times daily with a meal.   60 tablet   1   . Cholecalciferol (VITAMIN D-3) 1000 UNITS CAPS   Oral   Take 1 capsule by mouth daily.         . citalopram (CELEXA) 20 MG tablet   Oral   Take 20 mg by mouth at bedtime.         . cycloSPORINE modified (NEORAL) 25 MG capsule   Oral   Take 75 mg by mouth 2 (two) times daily. 75mg  in the morning and 50mg  in the evening         . ferrous sulfate 325 (65 FE) MG tablet   Oral   Take 325 mg by mouth daily with breakfast.         . furosemide (LASIX) 20 MG tablet   Oral   Take 20 mg by mouth every other day. Takes on Monday, Wednesday, and Friday         . hydrocortisone (CORTEF) 20 MG tablet   Oral   Take by mouth 2 (two) times daily.          Marland Kitchen levothyroxine  (SYNTHROID, LEVOTHROID) 88 MCG tablet   Oral   Take 88 mcg by mouth daily before breakfast.         . magnesium oxide (MAG-OX) 400 MG tablet   Oral   Take 400 mg by mouth daily.         . Multiple Vitamin (MULITIVITAMIN WITH MINERALS) TABS   Oral   Take 1 tablet by mouth daily.         . mycophenolate (CELLCEPT) 500 MG tablet   Oral   Take 500 mg by mouth 2 (two) times daily.          . pravastatin (PRAVACHOL) 20 MG tablet   Oral   Take 20 mg by mouth at bedtime.          . sulfamethoxazole-trimethoprim (BACTRIM,SEPTRA) 400-80 MG per tablet   Oral   Take 1 tablet by mouth every other day. Takes on Monday, Wednesday, and Friday         . valGANciclovir (VALCYTE) 450 MG tablet   Oral   Take 450 mg by mouth every other day. Monday , Wednesday and Friday          BP 211/84  Pulse 71  Resp 29  SpO2 85% Physical Exam  Nursing note and vitals reviewed. Constitutional: She appears well-developed and well-nourished. No distress.  Able to speak in full sentences, currently getting a nebulizer treatment  HENT:  Head: Normocephalic and atraumatic.  Right Ear: External ear normal.  Left Ear: External ear normal.  Eyes: Conjunctivae are normal. Right eye exhibits no discharge. Left eye exhibits no discharge. No scleral icterus.  Neck: Neck supple. No tracheal deviation present.  Cardiovascular: Normal rate, regular rhythm and intact distal pulses.   Pulmonary/Chest: Effort normal. No stridor. No respiratory distress. She has wheezes. She has rales.  Diffuse wheezing and crackles especially at end expiration  Abdominal: Soft. Bowel sounds are normal. She exhibits no distension. There is no tenderness. There is no rebound and no guarding.  Musculoskeletal: She exhibits no edema and no tenderness.  Neurological: She is alert. She has normal strength. No cranial nerve deficit (no facial droop, extraocular movements intact, no slurred speech) or sensory deficit. She  exhibits normal muscle tone. She displays  no seizure activity. Coordination normal.  Skin: Skin is warm and dry. No rash noted.  Psychiatric: She has a normal mood and affect.    ED Course  Procedures (including critical care time) Labs Review Labs Reviewed  BASIC METABOLIC PANEL - Abnormal; Notable for the following:    Glucose, Bld 137 (*)    BUN 52 (*)    Creatinine, Ser 2.54 (*)    GFR calc non Af Amer 17 (*)    GFR calc Af Amer 20 (*)    All other components within normal limits  CBC - Abnormal; Notable for the following:    WBC 11.3 (*)    RBC 3.27 (*)    Hemoglobin 10.8 (*)    HCT 33.4 (*)    MCV 102.1 (*)    All other components within normal limits  PRO B NATRIURETIC PEPTIDE - Abnormal; Notable for the following:    Pro B Natriuretic peptide (BNP) 42353.6 (*)    All other components within normal limits  I-STAT TROPOININ, ED   Imaging Review Dg Chest 2 View  07/16/2013   CLINICAL DATA:  Short of breath today.  Bronchoscopy yesterday.  EXAM: CHEST  2 VIEW  COMPARISON:  02/06/2012  FINDINGS: No pneumothorax.  Postsurgical changes are noted with multiple pericarinal vascular clips. This is stable.  Coarse reticular opacities are noted in the bases most likely atelectasis and/or scarring. There is blunting of the lateral costophrenic sulcus on the right with some fluid extending into the lower right oblique fissure. This is new. Blunting of the lateral left costophrenic sulcus is stable consistent with chronic pleural thickening. No convincing pulmonary edema.  Cardiac silhouette is normal in size. No mediastinal or hilar masses. No evidence of adenopathy.  Bony thorax is demineralized but grossly intact.  IMPRESSION: 1. No pneumothorax or evidence of an complication following bronchoscopy. 2. Small amount of right basilar fluid which tracks along the inferior right oblique fissure. 3. Lung base opacity bilaterally, greater on the right. This may be atelectasis, scarring or a  combination. Right lung base infiltrate should be considered if there are symptoms consistent with pneumonia. No convincing pulmonary edema.   Electronically Signed   By: Lajean Manes M.D.   On: 07/16/2013 10:40     EKG Interpretation   Date/Time:  Wednesday July 16 2013 10:03:28 EDT Ventricular Rate:  73 PR Interval:  170 QRS Duration: 93 QT Interval:  417 QTC Calculation: 459 R Axis:   -13 Text Interpretation:  Sinus rhythm LVH with secondary repolarization  abnormality No significant change since last tracing Confirmed by Barnaby Rippeon   MD-J, Andjela Wickes ([id number]) on 07/16/2013 10:15:10 AM     Medications  furosemide (LASIX) injection 40 mg (not administered)  albuterol (PROVENTIL,VENTOLIN) solution continuous neb (10 mg/hr Nebulization Given 07/16/13 1042)  ipratropium (ATROVENT) nebulizer solution 0.5 mg (0.5 mg Nebulization Given 07/16/13 1042)   1202  Pt is breathing better after breathing treatment.    MDM   Final diagnoses:  Bronchospasm  Elevated brain natriuretic peptide (BNP) level   Pt with history of lung transplant s/p bronchoscopy presents with shortness of breath and wheezing.   Her exam is most suggestive of bronchospasm and she has responded to albuterol/Atrovent. However, she has to sit significantly elevated bnp.  Chest x-ray does not suggest severe pulmonary edema however.  It is possible she could have a component of both.  I discussed the findings with the patient and recommended she be admitted to the hospital for further evaluation  and treatment. I discussed transferring to [facility] to be cared for by her transplant doctors. Patient states she would rather be admitted to the hospital here. I will consult with the medical service.    Kathalene Frames, MD 07/16/13 1220  The secretary has been contacting [place] so that I could notify Ms. Shibuya's transplant doctors.  As of 1435 have not heard back from them.  Kathalene Frames, MD 07/16/13 [phone]

## 2013-07-16 NOTE — ED Notes (Signed)
Per EMS: Pt from home.  C/o SOB starting around 1900 last night.  States that she had a bronchoscopy done at [place] yesterday.  Hx of double lung transplant w/ no complications d/t COPD.

## 2013-07-16 NOTE — H&P (Signed)
[facility] History and Physical  Nancy Blair NKN:[id number] DOB: 05-Sep-1935 DOA: 07/16/2013  Referring physician:  PCP: Annye Asa, MD  Specialists:   Chief Complaint: SOB  HPI: Nancy Blair is a 78 y.o. female with PMH of HTN, DM, GERD, CKD, BL lung transplant for COPD (3 yrs ago at [facility]) who had regular f/u bronchoscopy on 3/31 at [facility] presented today with SOB with non productive cough; The bronchoscopy yesterday was performed as part of her routine surveillance. She's had maybe 15 bronchoscopies in the past and has never had any issues or complications. Denies exertional sym;toms; chest pain, no PND orthopnea, no leg edema; no fever, no nausea, vomiting or diarrhea;    Review of Systems: The patient denies anorexia, fever, weight loss,, vision loss, decreased hearing, hoarseness, chest pain, syncope, dyspnea on exertion, peripheral edema, balance deficits, hemoptysis, abdominal pain, melena, hematochezia, severe indigestion/heartburn, hematuria, incontinence, genital sores, muscle weakness, suspicious skin lesions, transient blindness, difficulty walking, depression, unusual weight change, abnormal bleeding, enlarged lymph nodes, angioedema, and breast masses.    Past Medical History  Diagnosis Date  . Emphysema of lung   . Hypertension   . Hyperlipidemia   . Allergy   . Anemia   . GERD (gastroesophageal reflux disease)   . Adrenal insufficiency   . Squamous acanthoma of skin 06/2011    rt leg  . Hypothyroidism   . CKD (chronic kidney disease) stage 4, GFR 15-29 ml/min   . Lung transplant status, bilateral    Past Surgical History  Procedure Laterality Date  . Appendectomy    . Abdominal hysterectomy    . Tonsillectomy    . Total lung replacement  2012    b/l lung transplant  . Esophagogastroduodenoscopy  05/26/2011    Procedure: ESOPHAGOGASTRODUODENOSCOPY (EGD);  Surgeon: Owens Loffler, MD;  Location: [facility];  Service: Endoscopy;  Laterality: N/A;    Social History:  reports that she has quit smoking. She started smoking about 61 years ago. She has never used smokeless tobacco. She reports that she does not drink alcohol or use illicit drugs. Home;  where does patient live--home, ALF, SNF? and with whom if at home? Yes;  Can patient participate in ADLs?  Allergies  Allergen Reactions  . Grapefruit Extract Anaphylaxis  . Oysters [Shellfish Allergy] Anaphylaxis  . Pineapple Anaphylaxis  . Bupropion Other (See Comments)    sleepy  . Neomycin-Bacitracin Zn-Polymyx Hives and Other (See Comments)    blisters  . Penicillins Swelling    Just face sweeling  . Prednisone Other (See Comments)    crazy  . Septra [Sulfamethoxazole-Tmp Ds]     unknown    Family History  Problem Relation Age of Onset  . Heart disease Mother   . Heart disease Father   . Pancreatic cancer Sister   . Colon cancer Neg Hx   . Malignant hyperthermia Neg Hx   . Hypertension Mother   . Hypertension Father     (be sure to complete)  Prior to Admission medications   Medication Sig Start Date End Date Taking? Authorizing Provider  aspirin 81 MG chewable tablet Chew 81 mg by mouth daily.   Yes Historical Provider, MD  Calcium Carbonate-Vitamin D (CALCIUM 600 + D PO) Take 2 tablets by mouth 2 (two) times daily.    Yes Historical Provider, MD  carvedilol (COREG) 6.25 MG tablet Take 1 tablet (6.25 mg total) by mouth 2 (two) times daily with a meal. 02/07/12  Yes Bonnielee Haff, MD  Cholecalciferol (  VITAMIN D-3) 1000 UNITS CAPS Take 1 capsule by mouth daily.   Yes Historical Provider, MD  citalopram (CELEXA) 20 MG tablet Take 20 mg by mouth at bedtime.   Yes Historical Provider, MD  cycloSPORINE modified (NEORAL) 25 MG capsule Take 75 mg by mouth 2 (two) times daily. 75mg  in the morning and 50mg  in the evening   Yes Historical Provider, MD  ferrous sulfate 325 (65 FE) MG tablet Take 325 mg by mouth daily with breakfast.   Yes Historical Provider, MD  furosemide  (LASIX) 20 MG tablet Take 20 mg by mouth every other day. Takes on Monday, Wednesday, and Friday 01/04/12  Yes Historical Provider, MD  hydrocortisone (CORTEF) 20 MG tablet Take by mouth 2 (two) times daily.  10/27/11  Yes Historical Provider, MD  levothyroxine (SYNTHROID, LEVOTHROID) 88 MCG tablet Take 88 mcg by mouth daily before breakfast.   Yes Historical Provider, MD  magnesium oxide (MAG-OX) 400 MG tablet Take 400 mg by mouth daily.   Yes Historical Provider, MD  Multiple Vitamin (MULITIVITAMIN WITH MINERALS) TABS Take 1 tablet by mouth daily.   Yes Historical Provider, MD  mycophenolate (CELLCEPT) 500 MG tablet Take 500 mg by mouth 2 (two) times daily.  12/21/11  Yes Historical Provider, MD  pravastatin (PRAVACHOL) 20 MG tablet Take 20 mg by mouth at bedtime.    Yes Historical Provider, MD  sulfamethoxazole-trimethoprim (BACTRIM,SEPTRA) 400-80 MG per tablet Take 1 tablet by mouth every other day. Takes on Monday, Wednesday, and Friday   Yes Historical Provider, MD  valGANciclovir (VALCYTE) 450 MG tablet Take 450 mg by mouth every other day. Monday , Wednesday and Friday   Yes Historical Provider, MD   Physical Exam: Filed Vitals:   07/16/13 1253  BP: 189/76  Pulse: 70  Temp: 98.3 F (36.8 C)  Resp: 24     General:  alert  Eyes: eom-i  ENT: no oral ulcers   Neck: supple   Cardiovascular: s1,s2 rrr  Respiratory: LL crackles  Abdomen: soft, nt,nd   Skin: some erythematous rash   Musculoskeletal: no LE edema  Psychiatric: no hallucinations   Neurologic: CN 2-12 intact   Labs on Admission:  Basic Metabolic Panel:  Recent Labs Lab 07/11/13 1042 07/16/13 1030  NA 138 137  K 4.2 3.9  CL 104 100  CO2 24 20  GLUCOSE 92 137*  BUN 53* 52*  CREATININE 2.9* 2.54*  CALCIUM 8.9 9.3  MG 2.3  --    Liver Function Tests:  Recent Labs Lab 07/11/13 1042  AST 21  ALT 18  ALKPHOS 46  BILITOT 0.7  PROT 5.9*  ALBUMIN 3.3*   No results found for this basename: LIPASE,  AMYLASE,  in the last 168 hours No results found for this basename: AMMONIA,  in the last 168 hours CBC:  Recent Labs Lab 07/11/13 1042 07/16/13 1030  WBC 9.9 11.3*  NEUTROABS 7.6  --   HGB 10.7* 10.8*  HCT 32.0* 33.4*  MCV 101.1* 102.1*  PLT 225.0 202   Cardiac Enzymes: No results found for this basename: CKTOTAL, CKMB, CKMBINDEX, TROPONINI,  in the last 168 hours  BNP (last 3 results)  Recent Labs  07/16/13 1030  PROBNP 26763.0*   CBG: No results found for this basename: GLUCAP,  in the last 168 hours  Radiological Exams on Admission: Dg Chest 2 View  07/16/2013   CLINICAL DATA:  Short of breath today.  Bronchoscopy yesterday.  EXAM: CHEST  2 VIEW  COMPARISON:  02/06/2012  FINDINGS: No pneumothorax.  Postsurgical changes are noted with multiple pericarinal vascular clips. This is stable.  Coarse reticular opacities are noted in the bases most likely atelectasis and/or scarring. There is blunting of the lateral costophrenic sulcus on the right with some fluid extending into the lower right oblique fissure. This is new. Blunting of the lateral left costophrenic sulcus is stable consistent with chronic pleural thickening. No convincing pulmonary edema.  Cardiac silhouette is normal in size. No mediastinal or hilar masses. No evidence of adenopathy.  Bony thorax is demineralized but grossly intact.  IMPRESSION: 1. No pneumothorax or evidence of an complication following bronchoscopy. 2. Small amount of right basilar fluid which tracks along the inferior right oblique fissure. 3. Lung base opacity bilaterally, greater on the right. This may be atelectasis, scarring or a combination. Right lung base infiltrate should be considered if there are symptoms consistent with pneumonia. No convincing pulmonary edema.   Electronically Signed   By: Lajean Manes M.D.   On: 07/16/2013 10:40    EKG: Independently reviewed. NSR; no acute ST changes   Assessment/Plan Principal Problem:   SOB  (shortness of breath) Active Problems:   HYPOTHYROIDISM   HYPERTENSION   Lung transplant status, bilateral   78 y.o. female with PMH of HTN, GERD, CKD, DM, BL lung transplant for COPD (3 yrs ago at [facility]) who had regular f/u bronchoscopy on 3/31 at [facility] presented today with SOB with non productive cough;  1. SOB likely multifactorial; CHF vs developing pneumonia vs atelectasis  -cont diuresis, started empiric atx; obtain pulmonology evaluation;   2. Probable pneumonia vs atelectasis; +non productive cough, +mild leukocytosis; but afebrile  -on immunosuppressant for lung transplant  -CXR: Lung base opacity bilaterally, greater on the right, Small amount of right basilar fluid which tracks along the inferior right oblique fissure -started empiric atx; bronchodilators prn'; oxygen; consulted pulmonology   3. BL lung transplant for COPD (3 yrs ago at [facility]) -on immunosuppressants, bactrim   4. ? Chronic CHF fluid retention with CKD -obtain echo; cont diuresis lasix changed from 20 to 40 daily; monitor renal function; daily weight; I/O   5. HTN uncontrolled  -increased carvedilol; prn hydralazine; titrate meds as needed   Called her pulmonologist at [facility] paged few times Dr. Patsy Lager. Snyder no answer; try later   Pulmonology;  if consultant consulted, please document name and whether formally or informally consulted  Code Status: full (must indicate code status--if unknown or must be presumed, indicate so) Family Communication:  D/w patient, her husband  (indicate person spoken with, if applicable, with phone number if by telephone) Disposition Plan: home 24-48 hours  (indicate anticipated LOS)  Time spent: >35 minutes   Kinnie Feil [facility] Pager [phone]  If 7PM-7AM, please contact night-coverage [email or web address] Password [email or web address] 07/16/2013, 1:41 PM

## 2013-07-16 NOTE — Progress Notes (Signed)
ANTIBIOTIC CONSULT NOTE - INITIAL  Pharmacy Consult for Levaquin Indication: R/O PNA  Allergies  Allergen Reactions  . Grapefruit Extract Anaphylaxis  . Oysters [Shellfish Allergy] Anaphylaxis  . Pineapple Anaphylaxis  . Bupropion Other (See Comments)    sleepy  . Neomycin-Bacitracin Zn-Polymyx Hives and Other (See Comments)    blisters  . Penicillins Swelling    Just face sweeling  . Prednisone Other (See Comments)    crazy  . Septra [Sulfamethoxazole-Tmp Ds]     unknown    Patient Measurements:   Adjusted Body Weight:   Vital Signs: Temp: 98.3 F (36.8 C) (04/01 1253) Temp src: Oral (04/01 1253) BP: 160/81 mmHg (04/01 1420) Pulse Rate: 69 (04/01 1420) Intake/Output from previous day:   Intake/Output from this shift:    Labs:  Recent Labs  07/16/13 1030  WBC 11.3*  HGB 10.8*  PLT 202  CREATININE 2.54*   The CrCl is unknown because both a height and weight (above a minimum accepted value) are required for this calculation. No results found for this basename: VANCOTROUGH, VANCOPEAK, VANCORANDOM, GENTTROUGH, GENTPEAK, GENTRANDOM, TOBRATROUGH, TOBRAPEAK, TOBRARND, AMIKACINPEAK, AMIKACINTROU, AMIKACIN,  in the last 72 hours   Microbiology: No results found for this or any previous visit (from the past 720 hour(s)).  Medical History: Past Medical History  Diagnosis Date  . Emphysema of lung   . Hypertension   . Hyperlipidemia   . Allergy   . Anemia   . GERD (gastroesophageal reflux disease)   . Adrenal insufficiency   . Squamous acanthoma of skin 06/2011    rt leg  . Hypothyroidism   . CKD (chronic kidney disease) stage 4, GFR 15-29 ml/min   . Lung transplant status, bilateral     Assessment: 24 yoF with history of lung transplant 2/'12 on immunosuppressants and prophylactic antiviral and antibiotics presents to [facility] 4/1 with SOB.  PMHx: HTN, HLD, GERD, adrenal insufficiency, hypothyroidism, CKD-IV.  Pharmacy consulted to dose levaquin for possible  CAP.    Outpatient >> Bactrim, Valganciclovir  >>  4/1 >> Levaquin  >>    Tmax: 98.3 WBCs:Slightly elevated @ 11.3K CKD-IV: SCr 2.54 (baseline?) - CrCl estimated ~17 using old weight 59kg  4/1 blood x 2: Ordered  Allergies: Penicillins (face swellling), Septra (unknown) - however takes this chronically  Goal of Therapy:  Eradication of infection  Plan:  Levaquin 500mg  PO x 1, then 250mg  PO q48 hours.  F/u renal function closely and adjust as necessary.    Ralene Bathe, PharmD, BCPS 07/16/2013, 3:28 PM  Pager: [phone]

## 2013-07-16 NOTE — ED Notes (Signed)
Bed: EH20 Expected date:  Expected time:  Means of arrival:  Comments: SOB after bronchoscopy/hx double lung transplant

## 2013-07-16 NOTE — Consult Note (Addendum)
PULMONARY / CRITICAL CARE MEDICINE   Name: Nancy Blair MRN: [id number] DOB: [birth year]    ADMISSION DATE:  07/16/2013 CONSULTATION DATE: 4/1  REFERRING MD :  EDP PRIMARY SERVICE: [facility]  CHIEF COMPLAINT:  SOB hypoxia  BRIEF PATIENT DESCRIPTION:  78 yo with a history of lung transpalnt 05/2010 for COPD from smoking(referred by Sabino Donovan) she has not been back to any pulmonologist in [place] since. She is not on O2 or inhalers and has done well till 3/31. On 3/31 she had follow up FOB at [facility] without problem. 4/1 at 2:00 am she woke up sob, no F/C/S or purulent sputum. No chest pain or any other symptoms other than SOB. Seen by EDP and is being admitted. She is on 2 l [place] with sats 96% and in no distress at rest. She is being admitted by [facility] and PCCM asked for pulmonary consult. Note her bnp is [phone] and is on chemotherapy type medications.   SIGNIFICANT EVENTS / STUDIES:    LINES / TUBES:   CULTURES: 4/1 bc >> 4/1 uc>>  ANTIBIOTICS:   HISTORY OF PRESENT ILLNESS:   78 yo with a history of lung transpalnt 05/2010 for COPD from smoking(referred by Sabino Donovan) she has not been back to any pulmonologist in [place] since. She is not on O2 or inhalers and has done well till 3/31. On 3/31 she had follow up FOB at [facility] without problem. 4/1 at 2:00 am she woke up sob, no F/C/S or purulent sputum. No chest pain or any other symptoms other than SOB. Seen by EDP and is being admitted. She is on 2 l [place] with sats 96% and in no distress at rest. She is being admitted by [facility] and PCCM asked for pulmonary consult. Note her bnp is [phone] and is on chemotherapy type medications.  PAST MEDICAL HISTORY :  Past Medical History  Diagnosis Date  . Emphysema of lung   . Hypertension   . Hyperlipidemia   . Allergy   . Anemia   . GERD (gastroesophageal reflux disease)   . Adrenal insufficiency   . Squamous acanthoma of skin 06/2011    rt leg  . Hypothyroidism   . CKD (chronic kidney disease)  stage 4, GFR 15-29 ml/min   . Lung transplant status, bilateral    Past Surgical History  Procedure Laterality Date  . Appendectomy    . Abdominal hysterectomy    . Tonsillectomy    . Total lung replacement  2012    b/l lung transplant  . Esophagogastroduodenoscopy  05/26/2011    Procedure: ESOPHAGOGASTRODUODENOSCOPY (EGD);  Surgeon: Owens Loffler, MD;  Location: [facility];  Service: Endoscopy;  Laterality: N/A;   Prior to Admission medications   Medication Sig Start Date End Date Taking? Authorizing Provider  aspirin 81 MG chewable tablet Chew 81 mg by mouth daily.   Yes Historical Provider, MD  Calcium Carbonate-Vitamin D (CALCIUM 600 + D PO) Take 2 tablets by mouth 2 (two) times daily.    Yes Historical Provider, MD  carvedilol (COREG) 6.25 MG tablet Take 1 tablet (6.25 mg total) by mouth 2 (two) times daily with a meal. 02/07/12  Yes Bonnielee Haff, MD  Cholecalciferol (VITAMIN D-3) 1000 UNITS CAPS Take 1 capsule by mouth daily.   Yes Historical Provider, MD  citalopram (CELEXA) 20 MG tablet Take 20 mg by mouth at bedtime.   Yes Historical Provider, MD  cycloSPORINE modified (NEORAL) 25 MG capsule Take 75 mg by mouth 2 (two) times daily.  75mg  in the morning and 50mg  in the evening   Yes Historical Provider, MD  ferrous sulfate 325 (65 FE) MG tablet Take 325 mg by mouth daily with breakfast.   Yes Historical Provider, MD  furosemide (LASIX) 20 MG tablet Take 20 mg by mouth every other day. Takes on Monday, Wednesday, and Friday 01/04/12  Yes Historical Provider, MD  hydrocortisone (CORTEF) 20 MG tablet Take by mouth 2 (two) times daily.  10/27/11  Yes Historical Provider, MD  insulin regular (NOVOLIN R,HUMULIN R) 100 units/mL injection Inject 1-3 Units into the skin at bedtime as needed for high blood sugar. On sliding scale   Yes Historical Provider, MD  levothyroxine (SYNTHROID, LEVOTHROID) 88 MCG tablet Take 88 mcg by mouth daily before breakfast.   Yes Historical Provider, MD   magnesium oxide (MAG-OX) 400 MG tablet Take 400 mg by mouth daily.   Yes Historical Provider, MD  Multiple Vitamin (MULITIVITAMIN WITH MINERALS) TABS Take 1 tablet by mouth daily.   Yes Historical Provider, MD  mycophenolate (CELLCEPT) 500 MG tablet Take 500 mg by mouth 2 (two) times daily.  12/21/11  Yes Historical Provider, MD  pravastatin (PRAVACHOL) 20 MG tablet Take 20 mg by mouth at bedtime.    Yes Historical Provider, MD  sulfamethoxazole-trimethoprim (BACTRIM,SEPTRA) 400-80 MG per tablet Take 1 tablet by mouth every other day. Takes on Monday, Wednesday, and Friday   Yes Historical Provider, MD  valGANciclovir (VALCYTE) 450 MG tablet Take 450 mg by mouth every other day. Monday , Wednesday and Friday   Yes Historical Provider, MD   Allergies  Allergen Reactions  . Grapefruit Extract Anaphylaxis  . Oysters [Shellfish Allergy] Anaphylaxis  . Pineapple Anaphylaxis  . Bupropion Other (See Comments)    sleepy  . Neomycin-Bacitracin Zn-Polymyx Hives and Other (See Comments)    blisters  . Penicillins Swelling    Just face sweeling  . Prednisone Other (See Comments)    crazy  . Septra [Sulfamethoxazole-Tmp Ds]     unknown    FAMILY HISTORY:  Family History  Problem Relation Age of Onset  . Heart disease Mother   . Heart disease Father   . Pancreatic cancer Sister   . Colon cancer Neg Hx   . Malignant hyperthermia Neg Hx   . Hypertension Mother   . Hypertension Father    SOCIAL HISTORY:  reports that she quit smoking about 20 years ago. She started smoking about 61 years ago. She has never used smokeless tobacco. She reports that she does not drink alcohol or use illicit drugs.  REVIEW OF SYSTEMS:  10 point review of system taken, please see HPI for positives and negatives.   SUBJECTIVE:   VITAL SIGNS: Temp:  [98.3 F (36.8 C)] 98.3 F (36.8 C) (04/01 1253) Pulse Rate:  [70-71] 70 (04/01 1253) Resp:  [24-29] 24 (04/01 1253) BP: (189-211)/(76-84) 189/76 mmHg (04/01  1253) SpO2:  [85 %-99 %] 97 % (04/01 1253) FiO2 (%):  [8 %-21 %] 21 % (04/01 1046) HEMODYNAMICS:   VENTILATOR SETTINGS: Vent Mode:  [-]  FiO2 (%):  [8 %-21 %] 21 % INTAKE / OUTPUT: Intake/Output   None     PHYSICAL EXAMINATION: General: WNWDWF NAD at rest Neuro:  Intact HEENT: No JVD /LAN Cardiovascular:  RRR, III/VI syst M Lungs:  Coarse BS, Bibasilar crackles Abdomen:  Soft, NT, +BS Ext: warm, no edema  LABS:  CBC  Recent Labs Lab 07/11/13 1042 07/16/13 1030  WBC 9.9 11.3*  HGB 10.7* 10.8*  HCT  32.0* 33.4*  PLT 225.0 202   Coag's No results found for this basename: APTT, INR,  in the last 168 hours BMET  Recent Labs Lab 07/11/13 1042 07/16/13 1030  NA 138 137  K 4.2 3.9  CL 104 100  CO2 24 20  BUN 53* 52*  CREATININE 2.9* 2.54*  GLUCOSE 92 137*   Electrolytes  Recent Labs Lab 07/11/13 1042 07/16/13 1030  CALCIUM 8.9 9.3  MG 2.3  --    Sepsis Markers No results found for this basename: LATICACIDVEN, PROCALCITON, O2SATVEN,  in the last 168 hours ABG No results found for this basename: PHART, PCO2ART, PO2ART,  in the last 168 hours Liver Enzymes  Recent Labs Lab 07/11/13 1042  AST 21  ALT 18  ALKPHOS 46  BILITOT 0.7  ALBUMIN 3.3*   Cardiac Enzymes  Recent Labs Lab 07/16/13 1030  PROBNP 26763.0*   Glucose No results found for this basename: GLUCAP,  in the last 168 hours  Imaging Dg Chest 2 View  07/16/2013   CLINICAL DATA:  Short of breath today.  Bronchoscopy yesterday.  EXAM: CHEST  2 VIEW  COMPARISON:  02/06/2012  FINDINGS: No pneumothorax.  Postsurgical changes are noted with multiple pericarinal vascular clips. This is stable.  Coarse reticular opacities are noted in the bases most likely atelectasis and/or scarring. There is blunting of the lateral costophrenic sulcus on the right with some fluid extending into the lower right oblique fissure. This is new. Blunting of the lateral left costophrenic sulcus is stable consistent  with chronic pleural thickening. No convincing pulmonary edema.  Cardiac silhouette is normal in size. No mediastinal or hilar masses. No evidence of adenopathy.  Bony thorax is demineralized but grossly intact.  IMPRESSION: 1. No pneumothorax or evidence of an complication following bronchoscopy. 2. Small amount of right basilar fluid which tracks along the inferior right oblique fissure. 3. Lung base opacity bilaterally, greater on the right. This may be atelectasis, scarring or a combination. Right lung base infiltrate should be considered if there are symptoms consistent with pneumonia. No convincing pulmonary edema.   Electronically Signed   By: Lajean Manes M.D.   On: 07/16/2013 10:40       ASSESSMENT: S/P lung transplant 2012 S/P routine surveillance bronchoscopy 3/31 Acute resp distress, hypoxemia, orthopnea Systolic heart murmur Renal insufficiency Small R>L pleural effusions Possible RLL AS dz Markedly elevated pro-BNP Substantial improvement in ED with BDs and diuresis No prior hx of CHF Doubt PE  I am most suspicious that this is acute CHF/pulm edema   P:   Supplemental O2 as needed Empiric BD's Repeat Lasix X 1 this evening Empiric Levofloxacin for now Echocardiogram Recheck CXR in AM If Xray findings improve substantially, this would be supportive of dx of CHF Further eval to be dictated by Echo findings I will make effort to communicate with Dr Graylon Good @ [facility]   Merton Border, MD ; [facility] [phone].  After 5:30 PM or weekends, call [phone]  Seen with: Richardson Landry Minor ACNP Maryanna Shape PCCM Pager [phone] till 3 pm If no answer page [phone] 07/16/2013, 2:37 PM

## 2013-07-16 NOTE — Progress Notes (Signed)
Echo Lab  2D Echocardiogram completed.  Orient, RDCS 07/16/2013 4:10 PM

## 2013-07-16 NOTE — Progress Notes (Signed)
Called for report, secretary said she will call me back.

## 2013-07-17 ENCOUNTER — Observation Stay (HOSPITAL_COMMUNITY): Payer: Medicare Other

## 2013-07-17 ENCOUNTER — Encounter (HOSPITAL_COMMUNITY): Payer: Self-pay | Admitting: Physician Assistant

## 2013-07-17 ENCOUNTER — Other Ambulatory Visit: Payer: Self-pay | Admitting: Family Medicine

## 2013-07-17 DIAGNOSIS — Z942 Lung transplant status: Secondary | ICD-10-CM

## 2013-07-17 DIAGNOSIS — J9801 Acute bronchospasm: Secondary | ICD-10-CM

## 2013-07-17 DIAGNOSIS — I350 Nonrheumatic aortic (valve) stenosis: Secondary | ICD-10-CM | POA: Diagnosis present

## 2013-07-17 DIAGNOSIS — I359 Nonrheumatic aortic valve disorder, unspecified: Secondary | ICD-10-CM

## 2013-07-17 LAB — LEGIONELLA ANTIGEN, URINE: Legionella Antigen, Urine: NEGATIVE

## 2013-07-17 LAB — GLUCOSE, CAPILLARY
GLUCOSE-CAPILLARY: 170 mg/dL — AB (ref 70–99)
GLUCOSE-CAPILLARY: 149 mg/dL — AB (ref 70–99)
Glucose-Capillary: 142 mg/dL — ABNORMAL HIGH (ref 70–99)
Glucose-Capillary: 141 mg/dL — ABNORMAL HIGH (ref 70–99)

## 2013-07-17 LAB — CBC
HCT: 31.4 % — ABNORMAL LOW (ref 36.0–46.0)
Hemoglobin: 10.1 g/dL — ABNORMAL LOW (ref 12.0–15.0)
MCH: 32.6 pg (ref 26.0–34.0)
MCHC: 32.2 g/dL (ref 30.0–36.0)
MCV: 101.3 fL — ABNORMAL HIGH (ref 78.0–100.0)
PLATELETS: 198 10*3/uL (ref 150–400)
RBC: 3.1 MIL/uL — ABNORMAL LOW (ref 3.87–5.11)
RDW: 14.2 % (ref 11.5–15.5)
WBC: 12.4 10*3/uL — AB (ref 4.0–10.5)

## 2013-07-17 LAB — BASIC METABOLIC PANEL
BUN: 67 mg/dL — ABNORMAL HIGH (ref 6–23)
CALCIUM: 9.4 mg/dL (ref 8.4–10.5)
CO2: 23 mEq/L (ref 19–32)
Chloride: 101 mEq/L (ref 96–112)
Creatinine, Ser: 3.18 mg/dL — ABNORMAL HIGH (ref 0.50–1.10)
GFR calc Af Amer: 15 mL/min — ABNORMAL LOW (ref >90)
GFR, EST NON AFRICAN AMERICAN: 13 mL/min — AB (ref >90)
Glucose, Bld: 183 mg/dL — ABNORMAL HIGH (ref 70–99)
Potassium: 5.3 mEq/L (ref 3.7–5.3)
Sodium: 139 mEq/L (ref 137–147)

## 2013-07-17 MED ORDER — AMLODIPINE BESYLATE 2.5 MG PO TABS
2.5000 mg | ORAL_TABLET | Freq: Every day | ORAL | Status: DC
  Administered 2013-07-17 – 2013-07-18 (×2): 2.5 mg via ORAL
  Filled 2013-07-17 (×2): qty 1

## 2013-07-17 MED ORDER — DIPHENHYDRAMINE HCL 12.5 MG/5ML PO ELIX
12.5000 mg | ORAL_SOLUTION | ORAL | Status: AC | PRN
  Administered 2013-07-17 (×2): 12.5 mg via ORAL
  Filled 2013-07-17 (×2): qty 5

## 2013-07-17 NOTE — Progress Notes (Signed)
Note: This document was prepared with digital dictation and possible smart phrase technology. Any transcriptional errors that result from this process are unintentional.   Nancy Blair ASN:[id number] DOB: October 25, 1935 DOA: 07/16/2013 PCP: Annye Asa, MD  Brief narrative: 78 y/o ?, former smoker, COPD s/p bilateral orthotopic Lung transplant 06/06/10-last PFT's 07/15/13=Mild Obst defect , GERD s/p Nissen, previous CMV viremia [facility] MOvement disorder, Orthostatic hypotension, CKD stage 3-4 whow as admitted 07/15/13 with SOB. Found to have an elevated BNP 26,763, basilar fluid and lung base opacity on the right Pulmonology saw the patient and recommended diuresis an echocardiogram as he seemed more congestive heart failure >pneumonia  Past medical history-As per Problem list Chart reviewed as below- Reviewed  Consultants:  Pulmonology  Procedures:  Chest x-rays  Echocardiogram 07/16/13 = grade 1 diastolic dysfunction, moderate to severe aortic valve stenosis valve area 0.86 cm , mean gradient 36 mm mercury    Antibiotics:  Levofloxacin 4/1 ???     Subjective   alert pleasant oriented walking in the halls no chest pain Feels much better Feels ready to go home No nausea no vomiting Denies chest pain, fever, sputum   Objective    Interim History:   Telemetry:  Normal sinus rhythm   Objective: Filed Vitals:   07/17/13 0506 07/17/13 0637 07/17/13 0750 07/17/13 1006  BP:  162/90 168/76 137/66  Pulse:   78 71  Temp:   98.7 F (37.1 C)   TempSrc:   Oral   Resp:      Height:      Weight: 60.9 kg (134 lb 4.2 oz)     SpO2:        Intake/Output Summary (Last 24 hours) at 07/17/13 1102 Last data filed at 07/17/13 0920  Gross per 24 hour  Intake    720 ml  Output   2650 ml  Net  -1930 ml    Exam:  General:  Alert pleasant oriented no apparent distress Cardiovascular:  S1-S2, grade 2 murmur left upper sternal edge Respiratory:  Clinically clear Abdomen:  Soft  nontender nondistended Skin no lower extremity edema Neuro intact  Data Reviewed: Basic Metabolic Panel:  Recent Labs Lab 07/11/13 1042 07/16/13 1030 07/16/13 2115 07/17/13 0335  NA 138 137 138 139  K 4.2 3.9 5.1 5.3  CL 104 100 100 101  CO2 24 20 24 23   GLUCOSE 92 137* 133* 183*  BUN 53* 52* 65* 67*  CREATININE 2.9* 2.54* 3.26* 3.18*  CALCIUM 8.9 9.3 9.4 9.4  MG 2.3  --  2.2  --    Liver Function Tests:  Recent Labs Lab 07/11/13 1042  AST 21  ALT 18  ALKPHOS 46  BILITOT 0.7  PROT 5.9*  ALBUMIN 3.3*   No results found for this basename: LIPASE, AMYLASE,  in the last 168 hours No results found for this basename: AMMONIA,  in the last 168 hours CBC:  Recent Labs Lab 07/11/13 1042 07/16/13 1030 07/16/13 1610 07/17/13 0335  WBC 9.9 11.3* 11.1* 12.4*  NEUTROABS 7.6  --   --   --   HGB 10.7* 10.8* 10.5* 10.1*  HCT 32.0* 33.4* 32.7* 31.4*  MCV 101.1* 102.1* 103.2* 101.3*  PLT 225.0 202 193 198   Cardiac Enzymes: No results found for this basename: CKTOTAL, CKMB, CKMBINDEX, TROPONINI,  in the last 168 hours BNP: No components found with this basename: POCBNP,  CBG:  Recent Labs Lab 07/16/13 1655 07/16/13 2118 07/17/13 0720  GLUCAP 289* 130* 170*  Recent Results (from the past 240 hour(s))  CULTURE, BLOOD (ROUTINE X 2)     Status: None   Collection Time    07/16/13  4:00 PM      Result Value Ref Range Status   Specimen Description BLOOD RIGHT HAND   Final   Special Requests BOTTLES DRAWN AEROBIC AND ANAEROBIC 10CC   Final   Culture  Setup Time     Final   Value: 07/16/2013 20:45     Performed at [facility]   Culture     Final   Value:        BLOOD CULTURE RECEIVED NO GROWTH TO DATE CULTURE WILL BE HELD FOR 5 DAYS BEFORE ISSUING A FINAL NEGATIVE REPORT     Performed at [facility]   Report Status PENDING   Incomplete  CULTURE, BLOOD (ROUTINE X 2)     Status: None   Collection Time    07/16/13  4:11 PM      Result Value Ref  Range Status   Specimen Description BLOOD RIGHT ARM   Final   Special Requests BOTTLES DRAWN AEROBIC AND ANAEROBIC 10CC   Final   Culture  Setup Time     Final   Value: 07/16/2013 20:45     Performed at [facility]   Culture     Final   Value:        BLOOD CULTURE RECEIVED NO GROWTH TO DATE CULTURE WILL BE HELD FOR 5 DAYS BEFORE ISSUING A FINAL NEGATIVE REPORT     Performed at [facility]   Report Status PENDING   Incomplete     Studies:              All Imaging reviewed and is as per above notation   Scheduled Meds: . aspirin  81 mg Oral Daily  . carvedilol  12.5 mg Oral BID WC  . cholecalciferol  1,000 Units Oral Daily  . citalopram  20 mg Oral QHS  . cycloSPORINE modified  50 mg Oral QHS  . cycloSPORINE modified  75 mg Oral Q breakfast  . ferrous sulfate  325 mg Oral Q breakfast  . furosemide  40 mg Oral Q M,W,F  . heparin  5,000 Units Subcutaneous 3 times per day  . hydrocortisone  20 mg Oral BID AC  . insulin aspart  0-9 Units Subcutaneous TID WC  . [START ON 07/18/2013] levofloxacin  250 mg Oral Q48H  . levothyroxine  88 mcg Oral QAC breakfast  . magnesium oxide  400 mg Oral Daily  . multivitamin with minerals  1 tablet Oral Daily  . mycophenolate  500 mg Oral BID  . pravastatin  20 mg Oral QHS  . [START ON 07/18/2013] sulfamethoxazole-trimethoprim  1 tablet Oral Q M,W,F  . valGANciclovir  450 mg Oral Q M,W,F   Continuous Infusions:    Assessment/Plan: 1.  Acute hypoxic respiratory failure secondary to Decompensated acute new onset diastolic dysfunction in the setting of severe aortic stenosis-cardiology consulted. She is on Monday Wednesday Friday 20 mg Lasix. We will continue that dosing. Continue Coreg 12.5 twice a day, -1.9 L so far and doing very well 2. Severe aortic stenosis-cardiology consulted. Potentially may benefit from outpatient cardiac catheterization or consideration for T. aVR however her creatinine may preclude cath 3. S/p BOLT  2012-continue antirejection medications including cyclosporine 75 twice a day, hydrocortisone twice a day, CellCept 500 twice a day, valganciclovir 450 every other day appreciate pulmonary input-last cyclosporine level  was 1130 07/11/13 and this may need to be downward adjusted as an outpatient 4. Stable COPD 5. Grade 3-4 chronic kidney disease with acute injury component secondary to IV Lasix-hold IV Lasix for now-needs outpatient followup with nephrologist 6. Hypertension continue above meds and in addition hydralazine IV 10 mg daily when necessary pressure is elevated 7. Leukocytosis-likely secondary to chronic steroid therapy 8. Likely anemia of chronic disease vs. macrocytic anemia-monitor-continue iron 325 daily, 9. History syncope orthostasis-stable at present 10. History of GERD with Nissen fundoplication-monitor 11. History hypothyroidism continue levothyroxine 80 mcg 12. Possible chronic adrenal insufficiency-continue hydrocortisone 20 twice a day 13. Hyperlipidemia continue statin 20 mg each bedtime 14.  depression continue citalopram 20 each bedtime  Code Status: Full Family Communication: Discussed with family and patient Disposition Plan: Inpatient   Verneita Griffes, MD  [facility] Pager [phone] 07/17/2013, 11:02 AM    LOS: 1 day

## 2013-07-17 NOTE — Progress Notes (Signed)
UR completed 

## 2013-07-17 NOTE — Progress Notes (Signed)
PULMONARY / CRITICAL CARE MEDICINE   Name: Nancy Blair MRN: [id number] DOB: 26-Aug-1935    ADMISSION DATE:  07/16/2013 CONSULTATION DATE: 4/1  REFERRING MD :  EDP PRIMARY SERVICE: [facility]  CHIEF COMPLAINT:  SOB hypoxia  BRIEF PATIENT DESCRIPTION:  78 yo with a history of lung transpalnt 05/2010 for COPD from smoking(referred by Sabino Donovan) she has not been back to any pulmonologist in [place] since. She is not on O2 or inhalers and has done well till 3/31. On 3/31 she had a surveillance FOB at [facility] without problem. 4/1 at 2:00 am she woke up sob, no F/C/S or purulent sputum. No chest pain or any other symptoms other than SOB. Seen by EDP and is being admitted. She is on 2 l [place] with sats 96% and in no distress at rest. She is being admitted by [facility] and PCCM asked for pulmonary consult. Note her bnp is [phone] and is on immnosuppressants   SIGNIFICANT EVENTS / STUDIES:  Echo - severe AS 0.8   LINES / TUBES:   CULTURES: 4/1 bc >>ng 4/1 uc>>  ANTIBIOTICS:  SUBJECTIVE: dyspnea improved afebrile  VITAL SIGNS: Temp:  [97.8 F (36.6 C)-98.7 F (37.1 C)] 98.7 F (37.1 C) (04/02 0750) Pulse Rate:  [69-78] 71 (04/02 1006) Resp:  [20-24] 24 (04/01 2119) BP: (137-189)/(64-90) 137/66 mmHg (04/02 1006) SpO2:  [95 %-99 %] 99 % (04/02 0455) Weight:  [60.9 kg (134 lb 4.2 oz)-61.462 kg (135 lb 8 oz)] 60.9 kg (134 lb 4.2 oz) (04/02 0506) HEMODYNAMICS:   VENTILATOR SETTINGS:   INTAKE / OUTPUT: Intake/Output     04/01 0701 - 04/02 0700 04/02 0701 - 04/03 0700   P.O. 720    Total Intake(mL/kg) 720 (11.8)    Urine (mL/kg/hr) 2100 550 (2.1)   Total Output 2100 550   Net -1380 -550        Urine Occurrence 3 x      PHYSICAL EXAMINATION: General: WNWDWF NAD at rest Neuro:  Intact HEENT: No JVD /LAN Cardiovascular:  RRR, III/VI syst M Lungs:  Coarse BS, Bibasilar crackles Abdomen:  Soft, NT, +BS Ext: warm, no edema  LABS:  CBC  Recent Labs Lab 07/16/13 1030 07/16/13 1610  07/17/13 0335  WBC 11.3* 11.1* 12.4*  HGB 10.8* 10.5* 10.1*  HCT 33.4* 32.7* 31.4*  PLT 202 193 198   Coag's No results found for this basename: APTT, INR,  in the last 168 hours BMET  Recent Labs Lab 07/16/13 1030 07/16/13 2115 07/17/13 0335  NA 137 138 139  K 3.9 5.1 5.3  CL 100 100 101  CO2 20 24 23   BUN 52* 65* 67*  CREATININE 2.54* 3.26* 3.18*  GLUCOSE 137* 133* 183*   Electrolytes  Recent Labs Lab 07/11/13 1042 07/16/13 1030 07/16/13 2115 07/17/13 0335  CALCIUM 8.9 9.3 9.4 9.4  MG 2.3  --  2.2  --    Sepsis Markers  Recent Labs Lab 07/16/13 1610  PROCALCITON <0.10   ABG No results found for this basename: PHART, PCO2ART, PO2ART,  in the last 168 hours Liver Enzymes  Recent Labs Lab 07/11/13 1042  AST 21  ALT 18  ALKPHOS 46  BILITOT 0.7  ALBUMIN 3.3*   Cardiac Enzymes  Recent Labs Lab 07/16/13 1030  PROBNP 26763.0*   Glucose  Recent Labs Lab 07/16/13 1655 07/16/13 2118 07/17/13 0720  GLUCAP 289* 130* 170*    Imaging Dg Chest 2 View  07/17/2013   CLINICAL DATA:  Followup pneumonia and effusions  EXAM: CHEST  2 VIEW  COMPARISON:  Chest x-ray of 07/16/2013  FINDINGS: Aeration of the lungs has improved slightly and there has been some improvement in the degree of pulmonary vascular congestion. Small effusions remain, with possible loculation at the lung base on the lateral view posteriorly. Cardiomegaly is stable.  IMPRESSION: Slightly improved aeration with some improvement in pulmonary vascular congestion. Small effusions remain.   Electronically Signed   By: Ivar Drape M.D.   On: 07/17/2013 08:25   Dg Chest 2 View  07/16/2013   CLINICAL DATA:  Short of breath today.  Bronchoscopy yesterday.  EXAM: CHEST  2 VIEW  COMPARISON:  02/06/2012  FINDINGS: No pneumothorax.  Postsurgical changes are noted with multiple pericarinal vascular clips. This is stable.  Coarse reticular opacities are noted in the bases most likely atelectasis and/or  scarring. There is blunting of the lateral costophrenic sulcus on the right with some fluid extending into the lower right oblique fissure. This is new. Blunting of the lateral left costophrenic sulcus is stable consistent with chronic pleural thickening. No convincing pulmonary edema.  Cardiac silhouette is normal in size. No mediastinal or hilar masses. No evidence of adenopathy.  Bony thorax is demineralized but grossly intact.  IMPRESSION: 1. No pneumothorax or evidence of an complication following bronchoscopy. 2. Small amount of right basilar fluid which tracks along the inferior right oblique fissure. 3. Lung base opacity bilaterally, greater on the right. This may be atelectasis, scarring or a combination. Right lung base infiltrate should be considered if there are symptoms consistent with pneumonia. No convincing pulmonary edema.   Electronically Signed   By: Lajean Manes M.D.   On: 07/16/2013 10:40       ASSESSMENT: S/P lung transplant 2012 S/P routine surveillance bronchoscopy 3/31 Acute resp distress, hypoxemia, orthopnea Systolic heart murmur Renal insufficiency Small R>L pleural effusions Possible RLL AS dz Markedly elevated pro-BNP Substantial improvement in ED with BDs and diuresis Severe AS on echo Doubt PE  I am most suspicious that this is acute CHF/pulm edema   P:   Supplemental O2 as needed Empiric BD's Empiric Levofloxacin for now Suggest cardiology consult  Echo findings Please convey results of evaluation & cards recommendations to her transplant team @ [facility] She also needs FU with local nephrologist. I offered her pulmonary FU & she will call the office as needed  PCCM to sign off.   Kara Mead MD. Shade Flood. [place] [facility] Pager [phone] If no response call [phone]   07/17/2013, 11:20 AM

## 2013-07-17 NOTE — Telephone Encounter (Signed)
Med denied due to being in the hospital.

## 2013-07-17 NOTE — Consult Note (Addendum)
CARDIOLOGY CONSULT NOTE   Patient ID: Nancy Blair MRN: [id number] DOB/AGE: 76-08-1935 76 y.o.  Admit date: 07/16/2013  Primary Physician   Annye Asa, MD Primary Cardiologist   New Reason for Consultation: abnl echo, mod-severe AS   Nancy Blair is a 78 y.o. female with no history of CAD.  She is s/p lung transplant 3 yr ago at [facility], HTN, DM, GERD, CKD. She had a routine screening bronchoscopy 03/31 and woke with SOB on 04/01.   She was admitted with heart failure and an echocardiogram showed moderate-severe AS. Cardiology was asked to evaluate her. She has had a murmur all of her life. She does not remember when she last had an echocardiogram, but one was performed in 2012 prior to her lung transplant. Results are below.  She notes she has gained about 20 pounds. Her husband says she has been eating very well. She was fine when she went to bed but woke with shortness of breath in the middle of the night. She was unable to lie down after that, describing PND and orthopnea. She is not aware of any significant lower extremity edema and denies any symptoms up until just prior to admission. She believes she has lost 5 pounds since admission and feels she is breathing much better.  Upon reviewing the records from her bronchoscopy, she required some medications for blood pressure control including IV hydralazine, but there were no problems during the procedure and oxygen saturation was 93% on room air at discharge.  She has never had chest pain.  Past Medical History  Diagnosis Date  . Emphysema of lung   . Hypertension   . Hyperlipidemia   . Allergy   . Anemia   . GERD (gastroesophageal reflux disease)   . Adrenal insufficiency   . Squamous acanthoma of skin 06/2011    rt leg  . Hypothyroidism   . CKD (chronic kidney disease) stage 4, GFR 15-29 ml/min     since 2012 (per [facility] records)  . Lung transplant status, bilateral      Past Surgical History  Procedure  Laterality Date  . Appendectomy    . Abdominal hysterectomy    . Tonsillectomy    . Total lung replacement  2012    b/l lung transplant  . Esophagogastroduodenoscopy  05/26/2011    Procedure: ESOPHAGOGASTRODUODENOSCOPY (EGD);  Surgeon: Owens Loffler, MD;  Location: [facility];  Service: Endoscopy;  Laterality: N/A;   Allergies  Allergen Reactions  . Grapefruit Extract Anaphylaxis  . Oysters [Shellfish Allergy] Anaphylaxis  . Pineapple Anaphylaxis  . Bupropion Other (See Comments)    sleepy  . Neomycin-Bacitracin Zn-Polymyx Hives and Other (See Comments)    blisters  . Penicillins Swelling    Just face sweeling  . Prednisone Other (See Comments)    crazy  . Septra [Sulfamethoxazole-Tmp Ds]     unknown   I have reviewed the patient's current medications . aspirin  81 mg Oral Daily  . carvedilol  12.5 mg Oral BID WC  . cholecalciferol  1,000 Units Oral Daily  . citalopram  20 mg Oral QHS  . cycloSPORINE modified  50 mg Oral QHS  . cycloSPORINE modified  75 mg Oral Q breakfast  . ferrous sulfate  325 mg Oral Q breakfast  . furosemide  40 mg Oral Q M,W,F  . heparin  5,000 Units Subcutaneous 3 times per day  . hydrocortisone  20 mg Oral BID AC  . insulin aspart  0-9  Units Subcutaneous TID WC  . [START ON 07/18/2013] levofloxacin  250 mg Oral Q48H  . levothyroxine  88 mcg Oral QAC breakfast  . magnesium oxide  400 mg Oral Daily  . multivitamin with minerals  1 tablet Oral Daily  . mycophenolate  500 mg Oral BID  . pravastatin  20 mg Oral QHS  . [START ON 07/18/2013] sulfamethoxazole-trimethoprim  1 tablet Oral Q M,W,F  . valGANciclovir  450 mg Oral Q M,W,F     acetaminophen, acetaminophen, albuterol, diphenhydrAMINE, hydrALAZINE, ondansetron (ZOFRAN) IV, ondansetron  Medication Sig  aspirin 81 MG chewable tablet Chew 81 mg by mouth daily.  Calcium Carbonate-Vitamin D (CALCIUM 600 + D PO) Take 2 tablets by mouth 2 (two) times daily.   carvedilol (COREG) 6.25 MG tablet Take 1  tablet (6.25 mg total) by mouth 2 (two) times daily with a meal.  Cholecalciferol (VITAMIN D-3) 1000 UNITS CAPS Take 1 capsule by mouth daily.  citalopram (CELEXA) 20 MG tablet Take 20 mg by mouth at bedtime.  cycloSPORINE modified (NEORAL) 25 MG capsule Take 75 mg by mouth 2 (two) times daily. 75mg  in the morning and 50mg  in the evening  ferrous sulfate 325 (65 FE) MG tablet Take 325 mg by mouth daily with breakfast.  furosemide (LASIX) 20 MG tablet Take 20 mg by mouth every other day. Takes on Monday, Wednesday, and Friday  hydrocortisone (CORTEF) 20 MG tablet Take by mouth 2 (two) times daily.   insulin regular (NOVOLIN R,HUMULIN R) 100 units/mL injection Inject 1-3 Units into the skin at bedtime as needed for high blood sugar. On sliding scale  levothyroxine (SYNTHROID, LEVOTHROID) 88 MCG tablet Take 88 mcg by mouth daily before breakfast.  magnesium oxide (MAG-OX) 400 MG tablet Take 400 mg by mouth daily.  Multiple Vitamin (MULITIVITAMIN WITH MINERALS) TABS Take 1 tablet by mouth daily.  mycophenolate (CELLCEPT) 500 MG tablet Take 500 mg by mouth 2 (two) times daily.   pravastatin (PRAVACHOL) 20 MG tablet Take 20 mg by mouth at bedtime.   sulfamethoxazole-trimethoprim (BACTRIM,SEPTRA) 400-80 MG per tablet Take 1 tablet by mouth every other day. Takes on Monday, Wednesday, and Friday  valGANciclovir (VALCYTE) 450 MG tablet Take 450 mg by mouth every other day. Monday , Wednesday and Friday     History   Social History  . Marital Status: Married    Spouse Name: N/A    Number of Children: N/A  . Years of Education: N/A   Occupational History  . retired     Teacher, adult education   Social History Main Topics  . Smoking status: Former Smoker -- 3.00 packs/day    Start date: 04/17/1952    Quit date: 07/16/1993  . Smokeless tobacco: Never Used     Comment: quit 25 years ago  . Alcohol Use: No  . Drug Use: No  . Sexual Activity: No   Other Topics Concern  . Not on file   Social History  Narrative   Lives with husband   Is a DNAR   Daughter who is an Rn [phone]    Family Status  Relation Status Death Age  . Mother Deceased     CAD  . Father Deceased     CAD  . Sister Alive     2, alive and well  . Child Alive     3, alive and well   Family History  Problem Relation Age of Onset  . Heart disease Mother   . Heart disease Father   . Pancreatic  cancer Sister   . Colon cancer Neg Hx   . Malignant hyperthermia Neg Hx   . Hypertension Mother   . Hypertension Father      ROS:  Full 14 point review of systems complete and found to be negative unless listed above.  Physical Exam: Blood pressure 137/66, pulse 71, temperature 98.7 F (37.1 C), temperature source Oral, resp. rate 24, height 5\' 2"  (1.575 m), weight 134 lb 4.2 oz (60.9 kg), SpO2 99.00%.  General: Well developed, well nourished, female in no acute distress Head: Eyes PERRLA, No xanthomas.   Normocephalic and atraumatic, oropharynx without edema or exudate. Dentition: Poor Lungs: Breath sounds bases with some rails. Heart: HRRR S1 S2, no rub/gallop, 2-3/6 murmur. pulses are 2+ all 4 extrem.   Neck: No carotid bruits. No lymphadenopathy.  JVD at 10 cm. Abdomen: Bowel sounds present, abdomen soft and non-tender without masses or hernias noted. Msk:  No spine or cva tenderness. No weakness, no joint deformities or effusions. Extremities: No clubbing or cyanosis. No edema.  Neuro: Alert and oriented X 3. No focal deficits noted. Psych:  Good affect, responds appropriately Skin: No rashes or lesions noted.  Labs:   Lab Results  Component Value Date   WBC 12.4* 07/17/2013   HGB 10.1* 07/17/2013   HCT 31.4* 07/17/2013   MCV 101.3* 07/17/2013   PLT 198 07/17/2013    Recent Labs Lab 07/11/13 1042  07/17/13 0335  NA 138  < > 139  K 4.2  < > 5.3  CL 104  < > 101  CO2 24  < > 23  BUN 53*  < > 67*  CREATININE 2.9*  < > 3.18*  CALCIUM 8.9  < > 9.4  PROT 5.9*  --   --   BILITOT 0.7  --   --   ALKPHOS 46   --   --   ALT 18  --   --   AST 21  --   --   GLUCOSE 92  < > 183*  ALBUMIN 3.3*  --   --   < > = values in this interval not displayed. Magnesium  Date Value Ref Range Status  07/16/2013 2.2  1.5 - 2.5 mg/dL Final    Recent Labs  07/16/13 1043  TROPIPOC 0.06   Pro B Natriuretic peptide (BNP)  Date/Time Value Ref Range Status  07/16/2013 10:30 AM 26763.0* 0 - 450 pg/mL Final    Echo: 07/16/2013 Study Conclusions - Left ventricle: The cavity size was normal. Wall thickness was increased in a pattern of mild LVH. Systolic function was normal. The estimated ejection fraction was in the range of 60% to 65%. Wall motion was normal; there were no regional wall motion abnormalities. Doppler parameters are consistent with abnormal left ventricular relaxation (grade 1 diastolic dysfunction). Doppler parameters are consistent with high ventricular filling pressure. - Aortic valve: There was moderate to severe stenosis. Valve area: 0.75cm^2(VTI). Valve area: 0.86cm^2 (Vmax). - Mitral valve: Calcified annulus. Mild regurgitation. - Left atrium: The atrium was moderately dilated. - Pulmonary arteries: Systolic pressure was mildly increased. Impressions: - Normal LV function; calcified aortic valve with moderate to severe AS (mean gradient 36 mmHg).    2-D echocardiogram: 07/19/2010 ECHOCARDIOGRAPHIC MEASUREMENTS ----------------------------------------------- 2D DIMENSIONS PWT: nm* cm (0.6 - 1.0) nm* - not measured LEFT ATRIUM LA Area: 19 cm2 (< 20)  ECHOCARDIOGRAPHIC DESCRIPTIONS ----------------------------------------------- AORTIC ROOT Size: Normal Dissection: INDETERM FOR DISSECTION AORTIC VALVE Leaflets: Not seen Morphology: Not seen Mobility: Fully Mobile LEFT  VENTRICLE Anterior: Normal Size: Normal Lateral: Normal Contraction: Normal Septal: Normal Closest EF: >55% (Estimated) Apical: Normal LV masses: No Masses Inferior: Normal LVH: None Posterior:  Normal Dias.FxClass: RELAXATION ABNORMALITY (GRADE 1) CORRESPONDS TO E/A REVERSAL MITRAL VALVE Leaflets: Normal Mobility: Fully mobile Morphology: Normal LEFT ATRIUM Size: Normal LA masses: No masses Normal IAS MAIN PA Size: Normal PULMONIC VALVE Morphology: Normal Mobility: Fully Mobile RIGHT VENTRICLE Size: Normal Free wall: Normal  Contraction: Normal RV masses: No Masses TAPSE: 1.6 cm,  TRICUSPID VALVE Leaflets: Normal Mobility: Fully mobile Morphology: Normal RIGHT ATRIUM Size: Normal RA Other: None RA masses: No masses PERICARDIUM Fluid: No effusion INFERIOR VENACAVA Size: Normal Normal respiratory collapse DOPPLER ECHO and OTHER SPECIAL PROCEDURES ------------------------------------ Aortic: No AR No AS Resting LVOT Vel.: nm* Mitral: No MR No MS MV Inflow E Vel.= nm* cm/s MV Annulus E'Vel.= nm* cm/s E/E'Ratio= nm* Tricuspid: TRIVIAL TR No TS Pulmonary: No PR No PS Other: INTERPRETATION --------------------------------------------------------------- NORMAL LEFT VENTRICULAR SYSTOLIC FUNCTION NORMAL RIGHT VENTRICULAR SYSTOLIC FUNCTION VALVULAR REGURGITATION: TRIVIAL TR NO VALVULAR STENOSIS POOR SOUND TRANSMISSION AND NO PARASTERNAL WINDOWS S/P LUNG TRANSPLANT. GROSSLY NORMAL LV AND RV SIZE AND FUNCTION WITH NO SIGNIFICANT VALVULAR DISEASE. Compared with prior Echo study on 03/17/2010: NO SIGNIFICANT CHANGES   ECG:  07/16/2013 SR - Vent. rate 73 BPM PR interval 170 ms QRS duration 93 ms QT/QTc 417/459 ms P-R-T axes 5 -13 185  Radiology:  Dg Chest 2 View 07/17/2013   CLINICAL DATA:  Followup pneumonia and effusions  EXAM: CHEST  2 VIEW  COMPARISON:  Chest x-ray of 07/16/2013  FINDINGS: Aeration of the lungs has improved slightly and there has been some improvement in the degree of pulmonary vascular congestion. Small effusions remain, with possible loculation at the lung base on the lateral view posteriorly. Cardiomegaly is stable.  IMPRESSION: Slightly improved  aeration with some improvement in pulmonary vascular congestion. Small effusions remain.   Electronically Signed   By: Ivar Drape M.D.   On: 07/17/2013 08:25   Dg Chest 2 View 07/16/2013   CLINICAL DATA:  Short of breath today.  Bronchoscopy yesterday.  EXAM: CHEST  2 VIEW  COMPARISON:  02/06/2012  FINDINGS: No pneumothorax.  Postsurgical changes are noted with multiple pericarinal vascular clips. This is stable.  Coarse reticular opacities are noted in the bases most likely atelectasis and/or scarring. There is blunting of the lateral costophrenic sulcus on the right with some fluid extending into the lower right oblique fissure. This is new. Blunting of the lateral left costophrenic sulcus is stable consistent with chronic pleural thickening. No convincing pulmonary edema.  Cardiac silhouette is normal in size. No mediastinal or hilar masses. No evidence of adenopathy.  Bony thorax is demineralized but grossly intact.  IMPRESSION: 1. No pneumothorax or evidence of an complication following bronchoscopy. 2. Small amount of right basilar fluid which tracks along the inferior right oblique fissure. 3. Lung base opacity bilaterally, greater on the right. This may be atelectasis, scarring or a combination. Right lung base infiltrate should be considered if there are symptoms consistent with pneumonia. No convincing pulmonary edema.   Electronically Signed   By: Lajean Manes M.D.   On: 07/16/2013 10:40    ASSESSMENT AND PLAN:   The patient was seen today by Dr. Ron Parker, the patient evaluated and the data reviewed.   Acute diastolic CHF    The patient's volume overload is probably multifactorial. She has developed significant aortic stenosis over the past 4 years. She has not had radiation.  I cannot be sure how much the medications for her lung transplants has played a role in the decreasing function of her aortic valve. Her renal insufficiency is significant. She probably keeps her volume on this basis. In  addition, she presented with significant hypertension. This can certainly lead to heart failure in a patient with diastolic dysfunction. For now the goal be to try to stabilize her volume status. We need complete input from the nephrology team concerning all aspects of her renal status. We need very tight control of her blood pressure. Since her lung transplants were done at [facility], it may be most appropriate that her advanced care is done at [facility].    HYPOTHYROIDISM     I have ordered a TSH for today.    HYPERTENSION     We will have to push hard for tight blood pressure control. Significant hypertension can worsen her tendency to have CHF.  I am starting amlodipine 2.5 mg daily. I will wait to see from the nephrology team if they would prefer a different approach. Her blood pressure is clearly too high.    Lung transplant status, bilateral      It is our understanding from the [facility] that her recent bronchoscopy showed that her lung transplants are stable.    Acute respiratory failure with hypoxia     It appears that her symptoms probably are from volume overload.     CKD stage IV       Records reveal that she has had significant renal dysfunction since 2012. Recent records show that her creatinine has been as low as 2.5. It is 3.1 at this time. I suspect that her renal function is playing a role with her tendency to become volume overloaded. We have asked that full nephrology consult be done. There will have to be very careful decision making between nephrology and cardiology concerning what procedures might keep this patient's stable going forward.    Aortic stenosis    The patient has definite significant aortic stenosis. It is moderately severe to severe. It does not appear to be critical at this time. I have carefully reviewed both her echo from 2011 and from the current admission. The images were clearer in 2011. In addition her aortic valve actually move relatively well. There has been a  very significant change in her valve function over the past 4 years. The leaflets are not seen as well as this time. However the velocities across the aortic valve are significantly elevated. They are consistent with moderately severe or severe aortic stenosis. It will be difficult to decide how to approach the patient's aortic valve if a definitive procedure is needed. She has had bilateral lung transplants. I do not know if she can have valvular heart surgery. TAVR may have to be a consideration. However her renal function complicates the issue.   As part of today's evaluation I have reviewed her records extensively. I have spoken with her primary care physician. I had a lengthy discussion with her. Her medical problems are very complex. I spent greater than one hour with her overall care.    SignedRosaria Ferries, PA-C 07/17/2013 2:39 PM Beeper [phone] Patient seen and examined. I agree with the assessment and plan as detailed above. See also my additional thoughts below.    The full assessment and plan above are written by me.   Dola Argyle, MD, [facility] 07/17/2013 3:33 PM   Note: This document was prepared with digital dictation and possibly  smart Company secretary. Any transcriptional errors that result from this process are unintentional.

## 2013-07-18 ENCOUNTER — Observation Stay (HOSPITAL_COMMUNITY): Payer: Medicare Other

## 2013-07-18 DIAGNOSIS — N184 Chronic kidney disease, stage 4 (severe): Secondary | ICD-10-CM

## 2013-07-18 DIAGNOSIS — I5031 Acute diastolic (congestive) heart failure: Secondary | ICD-10-CM

## 2013-07-18 DIAGNOSIS — I509 Heart failure, unspecified: Secondary | ICD-10-CM

## 2013-07-18 LAB — URINALYSIS, ROUTINE W REFLEX MICROSCOPIC
Bilirubin Urine: NEGATIVE
GLUCOSE, UA: NEGATIVE mg/dL
HGB URINE DIPSTICK: NEGATIVE
Ketones, ur: NEGATIVE mg/dL
Nitrite: NEGATIVE
PH: 6 (ref 5.0–8.0)
PROTEIN: NEGATIVE mg/dL
Specific Gravity, Urine: 1.014 (ref 1.005–1.030)
Urobilinogen, UA: 0.2 mg/dL (ref 0.0–1.0)

## 2013-07-18 LAB — URINE MICROSCOPIC-ADD ON

## 2013-07-18 LAB — GLUCOSE, CAPILLARY
GLUCOSE-CAPILLARY: 102 mg/dL — AB (ref 70–99)
GLUCOSE-CAPILLARY: 160 mg/dL — AB (ref 70–99)
Glucose-Capillary: 116 mg/dL — ABNORMAL HIGH (ref 70–99)

## 2013-07-18 LAB — BASIC METABOLIC PANEL
BUN: 74 mg/dL — ABNORMAL HIGH (ref 6–23)
CALCIUM: 9.1 mg/dL (ref 8.4–10.5)
CO2: 22 meq/L (ref 19–32)
Chloride: 100 mEq/L (ref 96–112)
Creatinine, Ser: 3.41 mg/dL — ABNORMAL HIGH (ref 0.50–1.10)
GFR calc non Af Amer: 12 mL/min — ABNORMAL LOW (ref >90)
GFR, EST AFRICAN AMERICAN: 14 mL/min — AB (ref >90)
Glucose, Bld: 120 mg/dL — ABNORMAL HIGH (ref 70–99)
Potassium: 4.7 mEq/L (ref 3.7–5.3)
Sodium: 138 mEq/L (ref 137–147)

## 2013-07-18 LAB — SODIUM, URINE, RANDOM: Sodium, Ur: 60 mEq/L

## 2013-07-18 LAB — TSH: TSH: 2.1 u[IU]/mL (ref 0.350–4.500)

## 2013-07-18 LAB — PROCALCITONIN: PROCALCITONIN: 0.1 ng/mL

## 2013-07-18 LAB — CREATININE, URINE, RANDOM: Creatinine, Urine: 100.3 mg/dL

## 2013-07-18 MED ORDER — FUROSEMIDE 10 MG/ML IJ SOLN
20.0000 mg | Freq: Every day | INTRAMUSCULAR | Status: DC
  Filled 2013-07-18: qty 2

## 2013-07-18 MED ORDER — NIFEDIPINE ER 30 MG PO TB24
30.0000 mg | ORAL_TABLET | Freq: Every day | ORAL | Status: DC
  Administered 2013-07-18 – 2013-07-20 (×3): 30 mg via ORAL
  Filled 2013-07-18 (×3): qty 1

## 2013-07-18 MED ORDER — FUROSEMIDE 10 MG/ML IJ SOLN: 20.0000 mg | Freq: Every day | INTRAMUSCULAR | Status: DC

## 2013-07-18 NOTE — Progress Notes (Signed)
SUBJECTIVE: mild SOB this am. No chest pain.   BP 167/67  Pulse 68  Temp(Src) 98 F (36.7 C) (Oral)  Resp 20  Ht 5\' 2"  (1.575 m)  Wt 134 lb 4.2 oz (60.9 kg)  BMI 24.55 kg/m2  SpO2 100%  Intake/Output Summary (Last 24 hours) at 07/18/13 0737 Last data filed at 07/18/13 0433  Gross per 24 hour  Intake     60 ml  Output   1500 ml  Net  -1440 ml    PHYSICAL EXAM General: Well developed, well nourished, in no acute distress. Alert and oriented x 3.  Psych:  Good affect, responds appropriately Neck: No JVD. No masses noted.  Lungs: Rhonci bilaterally  Heart: RRR with harsh systolic murmur Abdomen: Bowel sounds are present. Soft, non-tender.  Extremities: No lower extremity edema.   LABS: Basic Metabolic Panel:  Recent Labs  07/16/13 2115 07/17/13 0335  NA 138 139  K 5.1 5.3  CL 100 101  CO2 24 23  GLUCOSE 133* 183*  BUN 65* 67*  CREATININE 3.26* 3.18*  CALCIUM 9.4 9.4  MG 2.2  --    CBC:  Recent Labs  07/16/13 1610 07/17/13 0335  WBC 11.1* 12.4*  HGB 10.5* 10.1*  HCT 32.7* 31.4*  MCV 103.2* 101.3*  PLT 193 198   Current Meds: . amLODipine  2.5 mg Oral Daily  . aspirin  81 mg Oral Daily  . carvedilol  12.5 mg Oral BID WC  . cholecalciferol  1,000 Units Oral Daily  . citalopram  20 mg Oral QHS  . cycloSPORINE modified  50 mg Oral QHS  . cycloSPORINE modified  75 mg Oral Q breakfast  . ferrous sulfate  325 mg Oral Q breakfast  . furosemide  40 mg Oral Q M,W,F  . heparin  5,000 Units Subcutaneous 3 times per day  . hydrocortisone  20 mg Oral BID AC  . insulin aspart  0-9 Units Subcutaneous TID WC  . levofloxacin  250 mg Oral Q48H  . levothyroxine  88 mcg Oral QAC breakfast  . magnesium oxide  400 mg Oral Daily  . multivitamin with minerals  1 tablet Oral Daily  . mycophenolate  500 mg Oral BID  . pravastatin  20 mg Oral QHS  . sulfamethoxazole-trimethoprim  1 tablet Oral Q M,W,F  . valGANciclovir  450 mg Oral Q M,W,F   Echo 07/16/13: Left  ventricle: The cavity size was normal. Wall thickness was increased in a pattern of mild LVH. Systolic function was normal. The estimated ejection fraction was in the range of 60% to 65%. Wall motion was normal; there were no regional wall motion abnormalities. Doppler parameters are consistent with abnormal left ventricular relaxation (grade 1 diastolic dysfunction). Doppler parameters are consistent with high ventricular filling pressure. - Aortic valve: There was moderate to severe stenosis. Valve area: 0.75cm^2(VTI). Valve area: 0.86cm^2 (Vmax). Mean gradient 36 mm Hg - Mitral valve: Calcified annulus. Mild regurgitation. - Left atrium: The atrium was moderately dilated. - Pulmonary arteries: Systolic pressure was mildly increased. Impressions: - Normal LV function; calcified aortic valve with moderate to severe AS (mean gradient 36 mmHg).  ASSESSMENT AND PLAN: 78 y.o. female with history of lung transplant 3 yr ago at [facility], HTN, DM, GERD, stage IV CKD, moderate to severe AS on echo this admission. Admitted 07/16/13 with CHF with pulmonary edema. Feels much better after diuresis.    1. Acute diastolic CHF: Likely onset as result of combination of uncontrolled HTN,  aortic stenosis and renal insufficiency. Significant clinical improvement with Lasix. She is net negative 1.4 liters over last 24 hours and 2.8 liters since admission. If ok with Nephrology, she would likely benefit from daily dose of Lasix. She is set to receive 40 mg po Lasix today.  2. Chronic Kidney disease, stage IV: Nephrology consult pending. Would ask Nephrology team to assist in management of her HTN as this will be beneficial in regards to CHF management.   3. Aortic stenosis: Moderately severe with mean gradient 36 mmHg. This is not critical but certainly could be playing a role in her presentation. Will need to be followed closely as an outpatient. Her outpatient f/u can be here locally with Dr. Ron Parker in our [address]  street office. Would plan for f/u appt with Dr. Ron Parker in 2-3 weeks post discharge.     Virdell Hoiland  4/3/20157:37 AM

## 2013-07-18 NOTE — Consult Note (Signed)
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 Presented to the hospital 3 days ago with shortness of breath and a nonproductive cough without chest pain/orthopnea/PND or pedal edema after posttransplant surveillance bronchoscopy on 07/15/13. She was admitted for management of congestive heart failure with moderate to severe aortic stenosis. Concern is raised with her worsening renal function and the challenges posed by this to her diuresis.   07/11/2013  07/16/2013  07/16/2013  07/17/2013  07/18/2013   BUN 53 (H) 52 (H) 65 (H) 67 (H) 74 (H)  Creatinine 2.9 (H) 2.54 (H) 3.26 (H) 3.18 (H) 3.41 (H)   She reports having a poor diet with unrestricted amounts of sodium intake-pickles/Chinese food/deli meats but denies nonsteroidal anti-inflammatory drugs. Reports that she is breathing better following diuresis so far. Denies any dysuria, urgency, frequency or flank pain. No preceding rash/hematuria/hemoptysis. No intravenous contrast exposure.  Past Medical History  Diagnosis Date  . Emphysema of lung   . Hypertension   . Hyperlipidemia   . Allergy   . Anemia   . GERD (gastroesophageal reflux disease)   . Adrenal insufficiency   . Squamous acanthoma of skin 06/2011    rt leg  . Hypothyroidism   . CKD (chronic kidney disease) stage 4, GFR 15-29 ml/min     since 2012 (per [facility] records)  . Lung transplant status, bilateral     Past Surgical History  Procedure Laterality Date  . Appendectomy    . Abdominal hysterectomy    . Tonsillectomy    . Total lung replacement  2012    b/l lung transplant  . Esophagogastroduodenoscopy  05/26/2011    Procedure: ESOPHAGOGASTRODUODENOSCOPY  (EGD);  Surgeon: Owens Loffler, MD;  Location: [facility];  Service: Endoscopy;  Laterality: N/A;    Family History  Problem Relation Age of Onset  . Heart disease Mother   . Heart disease Father   . Pancreatic cancer Sister   . Colon cancer Neg Hx   . Malignant hyperthermia Neg Hx   . Hypertension Mother   . Hypertension Father     Social History:  reports that she quit smoking about 20 years ago. She started smoking about 61 years ago. She has never used smokeless tobacco. She reports that she does not drink alcohol or use illicit drugs.  Allergies:  Allergies  Allergen Reactions  . Grapefruit Extract Anaphylaxis  . Oysters [Shellfish Allergy] Anaphylaxis  . Pineapple Anaphylaxis  . Bupropion Other (See Comments)    sleepy  . Neomycin-Bacitracin Zn-Polymyx Hives and Other (See Comments)    blisters  . Penicillins Swelling    Just face sweeling  . Prednisone Other (See Comments)    crazy  . Septra [Sulfamethoxazole-Tmp Ds]     unknown    Medications:  Scheduled: . aspirin  81 mg Oral Daily  . carvedilol  12.5 mg Oral BID WC  . cholecalciferol  1,000 Units Oral Daily  . citalopram  20 mg Oral QHS  . cycloSPORINE modified  50 mg Oral QHS  . cycloSPORINE modified  75 mg Oral Q breakfast  . ferrous sulfate  325 mg Oral Q breakfast  . furosemide  20 mg Intravenous Daily  . heparin  5,000 Units Subcutaneous 3 times per day  . hydrocortisone  20 mg Oral BID AC  . insulin aspart  0-9 Units Subcutaneous TID WC  . levothyroxine  88 mcg Oral QAC breakfast  . magnesium oxide  400 mg Oral Daily  . multivitamin with minerals  1 tablet Oral Daily  . mycophenolate  500 mg Oral BID  . NIFEdipine  30 mg Oral Daily  . pravastatin  20 mg Oral QHS  . sulfamethoxazole-trimethoprim  1 tablet Oral Q M,W,F  . valGANciclovir  450 mg Oral Q M,W,F    Results for orders placed during the hospital encounter of 07/16/13 (from the past 48 hour(s))  CULTURE, BLOOD (ROUTINE X 2)      Status: None   Collection Time    07/16/13  4:00 PM      Result Value Ref Range   Specimen Description BLOOD RIGHT HAND     Special Requests BOTTLES DRAWN AEROBIC AND ANAEROBIC 10CC     Culture  Setup Time       Value: 07/16/2013 20:45     Performed at [facility]   Culture       Value:        BLOOD CULTURE RECEIVED NO GROWTH TO DATE CULTURE WILL BE HELD FOR 5 DAYS BEFORE ISSUING A FINAL NEGATIVE REPORT     Performed at [facility]   Report Status PENDING    PROCALCITONIN     Status: None   Collection Time    07/16/13  4:10 PM      Result Value Ref Range   Procalcitonin <0.10     Comment:            Interpretation:     PCT (Procalcitonin) <= 0.5 ng/mL:     Systemic infection (sepsis) is not likely.     Local bacterial infection is possible.     (NOTE)             ICU PCT Algorithm               Non ICU PCT Algorithm        ----------------------------     ------------------------------             PCT < 0.25 ng/mL                 PCT < 0.1 ng/mL         Stopping of antibiotics            Stopping of antibiotics           strongly encouraged.               strongly encouraged.        ----------------------------     ------------------------------           PCT level decrease by               PCT < 0.25 ng/mL           >= 80% from peak PCT           OR PCT 0.25 - 0.5 ng/mL          Stopping of antibiotics                                                 encouraged.         Stopping of antibiotics  encouraged.        ----------------------------     ------------------------------           PCT level decrease by              PCT >= 0.25 ng/mL           < 80% from peak PCT            AND PCT >= 0.5 ng/mL            Continuing antibiotics                                                  encouraged.           Continuing antibiotics                encouraged.        ----------------------------     ------------------------------         PCT level  increase compared          PCT > 0.5 ng/mL             with peak PCT AND              PCT >= 0.5 ng/mL             Escalation of antibiotics                                              strongly encouraged.          Escalation of antibiotics            strongly encouraged.  CBC     Status: Abnormal   Collection Time    07/16/13  4:10 PM      Result Value Ref Range   WBC 11.1 (*) 4.0 - 10.5 K/uL   RBC 3.17 (*) 3.87 - 5.11 MIL/uL   Hemoglobin 10.5 (*) 12.0 - 15.0 g/dL   HCT 32.7 (*) 36.0 - 46.0 %   MCV 103.2 (*) 78.0 - 100.0 fL   MCH 33.1  26.0 - 34.0 pg   MCHC 32.1  30.0 - 36.0 g/dL   RDW 14.4  11.5 - 15.5 %   Platelets 193  150 - 400 K/uL  HEMOGLOBIN A1C     Status: Abnormal   Collection Time    07/16/13  4:10 PM      Result Value Ref Range   Hemoglobin A1C 5.8 (*) <5.7 %   Comment: (NOTE)                                                                               According to the ADA Clinical Practice Recommendations for 2011, when     HbA1c is used as a screening test:      >=6.5%   Diagnostic of Diabetes Mellitus               (  if abnormal result is confirmed)     5.7-6.4%   Increased risk of developing Diabetes Mellitus     References:Diagnosis and Classification of Diabetes Mellitus,Diabetes     OMBT,5974,16(LAGTX 1):S62-S69 and Standards of Medical Care in             Diabetes - 2011,Diabetes MIWO,0321,22 (Suppl 1):S11-S61.   Mean Plasma Glucose 120 (*) <117 mg/dL   Comment: Performed at [facility], BLOOD (ROUTINE X 2)     Status: None   Collection Time    07/16/13  4:11 PM      Result Value Ref Range   Specimen Description BLOOD RIGHT ARM     Special Requests BOTTLES DRAWN AEROBIC AND ANAEROBIC 10CC     Culture  Setup Time       Value: 07/16/2013 20:45     Performed at [facility]   Culture       Value:        BLOOD CULTURE RECEIVED NO GROWTH TO DATE CULTURE WILL BE HELD FOR 5 DAYS BEFORE ISSUING A FINAL NEGATIVE REPORT     Performed  at [facility]   Report Status PENDING    GLUCOSE, CAPILLARY     Status: Abnormal   Collection Time    07/16/13  4:55 PM      Result Value Ref Range   Glucose-Capillary 289 (*) 70 - 99 mg/dL  STREP PNEUMONIAE URINARY ANTIGEN     Status: None   Collection Time    07/16/13  5:53 PM      Result Value Ref Range   Strep Pneumo Urinary Antigen NEGATIVE  NEGATIVE   Comment:            Infection due to S. pneumoniae     cannot be absolutely ruled out     since the antigen present     may be below the detection limit     of the test.     Performed at [place], URINE     Status: None   Collection Time    07/16/13  5:53 PM      Result Value Ref Range   Specimen Description URINE, CLEAN CATCH     Special Requests NONE     Legionella Antigen, Urine       Value: Negative for Legionella pneumophilia serogroup 1     Performed at [facility]   Report Status 07/17/2013 FINAL    BASIC METABOLIC PANEL     Status: Abnormal   Collection Time    07/16/13  9:15 PM      Result Value Ref Range   Sodium 138  137 - 147 mEq/L   Potassium 5.1  3.7 - 5.3 mEq/L   Comment: DELTA CHECK NOTED     NO VISIBLE HEMOLYSIS   Chloride 100  96 - 112 mEq/L   CO2 24  19 - 32 mEq/L   Glucose, Bld 133 (*) 70 - 99 mg/dL   BUN 65 (*) 6 - 23 mg/dL   Creatinine, Ser 3.26 (*) 0.50 - 1.10 mg/dL   Calcium 9.4  8.4 - 10.5 mg/dL   GFR calc non Af Amer 13 (*) >90 mL/min   GFR calc Af Amer 15 (*) >90 mL/min   Comment: (NOTE)     The eGFR has been calculated using the CKD EPI equation.     This calculation has not been validated in all clinical situations.  eGFR's persistently <90 mL/min signify possible Chronic Kidney     Disease.  MAGNESIUM     Status: None   Collection Time    07/16/13  9:15 PM      Result Value Ref Range   Magnesium 2.2  1.5 - 2.5 mg/dL  GLUCOSE, CAPILLARY     Status: Abnormal   Collection Time    07/16/13  9:18 PM      Result Value Ref Range    Glucose-Capillary 130 (*) 70 - 99 mg/dL   Comment 1 Documented in Chart     Comment 2 Notify RN    BASIC METABOLIC PANEL     Status: Abnormal   Collection Time    07/17/13  3:35 AM      Result Value Ref Range   Sodium 139  137 - 147 mEq/L   Potassium 5.3  3.7 - 5.3 mEq/L   Chloride 101  96 - 112 mEq/L   CO2 23  19 - 32 mEq/L   Glucose, Bld 183 (*) 70 - 99 mg/dL   BUN 67 (*) 6 - 23 mg/dL   Creatinine, Ser 3.18 (*) 0.50 - 1.10 mg/dL   Calcium 9.4  8.4 - 10.5 mg/dL   GFR calc non Af Amer 13 (*) >90 mL/min   GFR calc Af Amer 15 (*) >90 mL/min   Comment: (NOTE)     The eGFR has been calculated using the CKD EPI equation.     This calculation has not been validated in all clinical situations.     eGFR's persistently <90 mL/min signify possible Chronic Kidney     Disease.  CBC     Status: Abnormal   Collection Time    07/17/13  3:35 AM      Result Value Ref Range   WBC 12.4 (*) 4.0 - 10.5 K/uL   RBC 3.10 (*) 3.87 - 5.11 MIL/uL   Hemoglobin 10.1 (*) 12.0 - 15.0 g/dL   HCT 31.4 (*) 36.0 - 46.0 %   MCV 101.3 (*) 78.0 - 100.0 fL   MCH 32.6  26.0 - 34.0 pg   MCHC 32.2  30.0 - 36.0 g/dL   RDW 14.2  11.5 - 15.5 %   Platelets 198  150 - 400 K/uL  GLUCOSE, CAPILLARY     Status: Abnormal   Collection Time    07/17/13  7:20 AM      Result Value Ref Range   Glucose-Capillary 170 (*) 70 - 99 mg/dL   Comment 1 Notify RN     Comment 2 Documented in Chart    GLUCOSE, CAPILLARY     Status: Abnormal   Collection Time    07/17/13 11:38 AM      Result Value Ref Range   Glucose-Capillary 142 (*) 70 - 99 mg/dL   Comment 1 Notify RN     Comment 2 Documented in Chart    GLUCOSE, CAPILLARY     Status: Abnormal   Collection Time    07/17/13  5:21 PM      Result Value Ref Range   Glucose-Capillary 149 (*) 70 - 99 mg/dL  GLUCOSE, CAPILLARY     Status: Abnormal   Collection Time    07/17/13  9:47 PM      Result Value Ref Range   Glucose-Capillary 141 (*) 70 - 99 mg/dL   Comment 1 Notify RN      Comment 2 Documented in Chart    PROCALCITONIN     Status: None  Collection Time    07/18/13  4:07 AM      Result Value Ref Range   Procalcitonin 0.10     Comment:            Interpretation:     PCT (Procalcitonin) <= 0.5 ng/mL:     Systemic infection (sepsis) is not likely.     Local bacterial infection is possible.     (NOTE)             ICU PCT Algorithm               Non ICU PCT Algorithm        ----------------------------     ------------------------------             PCT < 0.25 ng/mL                 PCT < 0.1 ng/mL         Stopping of antibiotics            Stopping of antibiotics           strongly encouraged.               strongly encouraged.        ----------------------------     ------------------------------           PCT level decrease by               PCT < 0.25 ng/mL           >= 80% from peak PCT           OR PCT 0.25 - 0.5 ng/mL          Stopping of antibiotics                                                 encouraged.         Stopping of antibiotics               encouraged.        ----------------------------     ------------------------------           PCT level decrease by              PCT >= 0.25 ng/mL           < 80% from peak PCT            AND PCT >= 0.5 ng/mL            Continuing antibiotics                                                  encouraged.           Continuing antibiotics                encouraged.        ----------------------------     ------------------------------         PCT level increase compared          PCT > 0.5 ng/mL             with peak PCT AND  PCT >= 0.5 ng/mL             Escalation of antibiotics                                              strongly encouraged.          Escalation of antibiotics            strongly encouraged.  TSH     Status: None   Collection Time    07/18/13  4:07 AM      Result Value Ref Range   TSH 2.100  0.350 - 4.500 uIU/mL   Comment: Please note change in reference range.      Performed at [place] PANEL     Status: Abnormal   Collection Time    07/18/13  4:07 AM      Result Value Ref Range   Sodium 138  137 - 147 mEq/L   Potassium 4.7  3.7 - 5.3 mEq/L   Chloride 100  96 - 112 mEq/L   CO2 22  19 - 32 mEq/L   Glucose, Bld 120 (*) 70 - 99 mg/dL   BUN 74 (*) 6 - 23 mg/dL   Creatinine, Ser 3.41 (*) 0.50 - 1.10 mg/dL   Calcium 9.1  8.4 - 10.5 mg/dL   GFR calc non Af Amer 12 (*) >90 mL/min   GFR calc Af Amer 14 (*) >90 mL/min   Comment: (NOTE)     The eGFR has been calculated using the CKD EPI equation.     This calculation has not been validated in all clinical situations.     eGFR's persistently <90 mL/min signify possible Chronic Kidney     Disease.  GLUCOSE, CAPILLARY     Status: Abnormal   Collection Time    07/18/13  7:46 AM      Result Value Ref Range   Glucose-Capillary 102 (*) 70 - 99 mg/dL   Comment 1 Documented in Chart     Comment 2 Notify RN      Dg Chest 2 View  07/17/2013   CLINICAL DATA:  Followup pneumonia and effusions  EXAM: CHEST  2 VIEW  COMPARISON:  Chest x-ray of 07/16/2013  FINDINGS: Aeration of the lungs has improved slightly and there has been some improvement in the degree of pulmonary vascular congestion. Small effusions remain, with possible loculation at the lung base on the lateral view posteriorly. Cardiomegaly is stable.  IMPRESSION: Slightly improved aeration with some improvement in pulmonary vascular congestion. Small effusions remain.   Electronically Signed   By: Ivar Drape M.D.   On: 07/17/2013 08:25    Review of Systems  Constitutional: Negative.   HENT: Negative.   Eyes: Negative.   Respiratory: Positive for shortness of breath. Negative for cough, hemoptysis, sputum production and wheezing.   Cardiovascular: Negative.   Gastrointestinal: Negative.   Genitourinary: Negative.   Musculoskeletal: Negative.   Skin: Negative.   Neurological: Negative.   Endo/Heme/Allergies: Negative.    Psychiatric/Behavioral: Negative.    Blood pressure 147/63, pulse 68, temperature 98 F (36.7 C), temperature source Oral, resp. rate 20, height _0  (1.575 m), weight 60.9 kg (134 lb 4.2 oz), SpO2 100.00%. Physical Exam  Nursing note and vitals reviewed. Constitutional: She is oriented to person, place, and time. She appears well-developed and well-nourished. No  distress.  HENT:  Head: Normocephalic and atraumatic.  Nose: Nose normal.  Mouth/Throat: No oropharyngeal exudate.  Eyes: Conjunctivae and EOM are normal. Pupils are equal, round, and reactive to light. No scleral icterus.  Neck: Normal range of motion. Neck supple. JVD present. No tracheal deviation present. No thyromegaly present.  Cardiovascular: Normal rate and regular rhythm.   Murmur heard. 3/6 HSM over outflow tract  Respiratory: Effort normal. No respiratory distress. She has no wheezes.  Expiratory rhonchi audible  GI: Soft. Bowel sounds are normal. She exhibits no distension. There is no tenderness. There is no rebound and no guarding.  Musculoskeletal: Normal range of motion. She exhibits no edema and no tenderness.  Lymphadenopathy:    She has no cervical adenopathy.  Neurological: She is alert and oriented to person, place, and time. No cranial nerve deficit. Coordination normal.  Skin: Skin is warm and dry. No rash noted. No erythema.  Psychiatric: She has a normal mood and affect. Her behavior is normal.    Assessment/Plan: 1. Acute renal failure chronic kidney disease stage IV-chronic kidney disease appears to be multifactorial from underlying hypertension/diabetes and probably CNI toxicity. Non-oliguric acute renal failure appears to be from CHF exacerbation . No other nephrotoxins exposure noted. Agree with efforts at diuresis and management of hypertension/CHF. Will change furosemide to 20 mg intravenous daily prior to transition to oral dosing. Possibility raised of cyclosporine toxicity-previous levels  elevated, she takes her p.m. dose at about 9 and a will recheck a trough level at 9 AM tomorrow. 2. CHF exacerbation/aortic stenosis: unfortunate combination with what appears to be progressive chronic kidney disease-we'll attempt to interrupt the vicious cycle with diuresis/improvement of renal function. 3. Hypertension: Replace amlodipine (slower onset of action) with nifedipine XL-the latter likely to act faster. On carvedilol 12.5 mg twice a day, dose appears to be limited by her current heart rate. 4. Anemia: Appears to be anemia of chronic illness, check iron stores 5. Status post bilateral lung transplant: Remains on antirejection therapy with CellCept and cyclosporine, on a steroid free regime. Suppression of opportunistic infections with Bactrim/Valcyte.  Marrissa Dai K. 07/18/2013, 11:09 AM

## 2013-07-18 NOTE — Progress Notes (Signed)
Note: This document was prepared with digital dictation and possible smart phrase technology. Any transcriptional errors that result from this process are unintentional.   Nancy Blair DPO:[id number] DOB: 1936-03-16 DOA: 07/16/2013 PCP: Nancy Asa, MD  Brief narrative: 78 y/o ?, former smoker, COPD s/p bilateral orthotopic Lung transplant 06/06/10-last PFT's 07/15/13=Mild Obst defect , GERD s/p Nissen, previous CMV viremia [facility] MOvement disorder, Orthostatic hypotension, CKD stage 3-4 whow as admitted 07/15/13 with SOB. Found to have an elevated BNP 26,763, basilar fluid and lung base opacity on the right Pulmonology saw the patient and recommended diuresis an echocardiogram as he seemed more congestive heart failure >pneumonia Ultimately echocardiogram showed severe aortic stenosis cardiology was consulted to help with management in the setting of renal insufficiency Nephrology was also consulted because of her renal insufficiency  Past medical history-As per Problem list Chart reviewed as below- Reviewed  Consultants:  Pulmonology  Procedures:  Chest x-rays  Echocardiogram 07/16/13 = grade 1 diastolic dysfunction, moderate to severe aortic valve stenosis valve area 0.86 cm , mean gradient 36 mm mercury    Antibiotics:  Levofloxacin 4/1 ???     Subjective  Nancy Blair, in no apparent distress presently Asthma Or shortness of breath at rest however slight dyspnea on exertion No nausea no vomiting nochest pain No blurred or double vision      Objective    Interim History:   Telemetry:  Normal sinus rhythm   Objective: Filed Vitals:   07/17/13 2147 07/18/13 0430 07/18/13 0553 07/18/13 0800  BP: 132/51 191/92 167/67 147/63  Pulse: 76 68    Temp: 98.9 F (37.2 C) 98 F (36.7 C)    TempSrc: Oral Oral    Resp: 24 20    Height:      Weight:  60.9 kg (134 lb 4.2 oz)    SpO2: 97% 100%      Intake/Output Summary (Last 24 hours) at 07/18/13 1325 Last data filed  at 07/18/13 1151  Gross per 24 hour  Intake     60 ml  Output   1500 ml  Net  -1440 ml    Exam:  General:  Alert pleasant oriented no apparent distress Cardiovascular:  S1-S2, grade 2 murmur left upper sternal edge Respiratory:  Clinically clear Abdomen:  Soft nontender nondistended Skin no lower extremity edema Neuro intact  Data Reviewed: Basic Metabolic Panel:  Recent Labs Lab 07/16/13 1030 07/16/13 2115 07/17/13 0335 07/18/13 0407  NA 137 138 139 138  K 3.9 5.1 5.3 4.7  CL 100 100 101 100  CO2 20 24 23 22   GLUCOSE 137* 133* 183* 120*  BUN 52* 65* 67* 74*  CREATININE 2.54* 3.26* 3.18* 3.41*  CALCIUM 9.3 9.4 9.4 9.1  MG  --  2.2  --   --    Liver Function Tests: No results found for this basename: AST, ALT, ALKPHOS, BILITOT, PROT, ALBUMIN,  in the last 168 hours No results found for this basename: LIPASE, AMYLASE,  in the last 168 hours No results found for this basename: AMMONIA,  in the last 168 hours CBC:  Recent Labs Lab 07/16/13 1030 07/16/13 1610 07/17/13 0335  WBC 11.3* 11.1* 12.4*  HGB 10.8* 10.5* 10.1*  HCT 33.4* 32.7* 31.4*  MCV 102.1* 103.2* 101.3*  PLT 202 193 198   Cardiac Enzymes: No results found for this basename: CKTOTAL, CKMB, CKMBINDEX, TROPONINI,  in the last 168 hours BNP: No components found with this basename: POCBNP,  CBG:  Recent Labs Lab 07/17/13 1138  07/17/13 1721 07/17/13 2147 07/18/13 0746 07/18/13 1146  GLUCAP 142* 149* 141* 102* 116*    Recent Results (from the past 240 hour(s))  CULTURE, BLOOD (ROUTINE X 2)     Status: None   Collection Time    07/16/13  4:00 PM      Result Value Ref Range Status   Specimen Description BLOOD RIGHT HAND   Final   Special Requests BOTTLES DRAWN AEROBIC AND ANAEROBIC 10CC   Final   Culture  Setup Time     Final   Value: 07/16/2013 20:45     Performed at [facility]   Culture     Final   Value:        BLOOD CULTURE RECEIVED NO GROWTH TO DATE CULTURE WILL BE HELD FOR  5 DAYS BEFORE ISSUING A FINAL NEGATIVE REPORT     Performed at [facility]   Report Status PENDING   Incomplete  CULTURE, BLOOD (ROUTINE X 2)     Status: None   Collection Time    07/16/13  4:11 PM      Result Value Ref Range Status   Specimen Description BLOOD RIGHT ARM   Final   Special Requests BOTTLES DRAWN AEROBIC AND ANAEROBIC 10CC   Final   Culture  Setup Time     Final   Value: 07/16/2013 20:45     Performed at [facility]   Culture     Final   Value:        BLOOD CULTURE RECEIVED NO GROWTH TO DATE CULTURE WILL BE HELD FOR 5 DAYS BEFORE ISSUING A FINAL NEGATIVE REPORT     Performed at [facility]   Report Status PENDING   Incomplete     Studies:              All Imaging reviewed and is as per above notation   Scheduled Meds: . aspirin  81 mg Oral Daily  . carvedilol  12.5 mg Oral BID WC  . cholecalciferol  1,000 Units Oral Daily  . citalopram  20 mg Oral QHS  . cycloSPORINE modified  50 mg Oral QHS  . cycloSPORINE modified  75 mg Oral Q breakfast  . ferrous sulfate  325 mg Oral Q breakfast  . [START ON 07/19/2013] furosemide  20 mg Intravenous Daily  . heparin  5,000 Units Subcutaneous 3 times per day  . hydrocortisone  20 mg Oral BID AC  . insulin aspart  0-9 Units Subcutaneous TID WC  . levothyroxine  88 mcg Oral QAC breakfast  . magnesium oxide  400 mg Oral Daily  . multivitamin with minerals  1 tablet Oral Daily  . mycophenolate  500 mg Oral BID  . NIFEdipine  30 mg Oral Daily  . pravastatin  20 mg Oral QHS  . sulfamethoxazole-trimethoprim  1 tablet Oral Q M,W,F  . valGANciclovir  450 mg Oral Q M,W,F   Continuous Infusions:    Assessment/Plan: 1.  Acute hypoxic respiratory failure secondary to Decompensated acute new onset diastolic dysfunction in the setting of severe aortic stenosis-appreciate consultant input-nephrology has changed her Lasix to 20 mg IV daily 4/3.Continue Coreg 12.5 twice a day, -3.37 L so far and doing very  well 2. Severe aortic stenosis-cardiology consulted. Potentially may benefit from outpatient cardiac catheterization/TVR-defer to cardiologyas an outpatient 3. S/p BOLT 2012-continue antirejection medications including cyclosporine 75 twice a day, hydrocortisone twice a day, CellCept 500 twice a day, valganciclovir 450 every other day , Bactrim  1 tablet Monday Wednesday Friday.   4. Stable COPD 5. Grade 5 chronic kidney disease with acute injury component secondary to IV Lasix-appreciate Nephrology note-we will continue Lasix IV and get cyclosporine level 9 AM 07/19/13-patient has very real risk of needing dialysis and is amenable to the same if needed 6. Hypertension continue hydralazine IV 10 mg daily when necessary pressure is elevated-amlodipine replaced with nifedipine for nephrology 7. Leukocytosis-likely secondary to chronic steroid therapy 8. Likely anemia of chronic disease vs. macrocytic anemia-monitor-continue iron 325 daily 9. History syncope orthostasis-stable at present 10. History of GERD with Nissen fundoplication-monitor 11. History hypothyroidism continue levothyroxine 88 mcg 12. Possible chronic adrenal insufficiency-continue hydrocortisone 20 twice a day 13. Hyperlipidemia continue statin 20 mg each bedtime 14.  depression continue citalopram 20 each bedtime  Code Status: Full Family Communication: Discussed with family and patient at bedside in person Disposition Plan: Inpatient   Verneita Griffes, MD  [facility] Pager [phone] 07/18/2013, 1:25 PM    LOS: 2 days

## 2013-07-18 NOTE — Progress Notes (Addendum)
Pt BP noted to be 90s/30s, HR is stable in the 70s. Pt is asymptomatic. [facility] on call notifed. No new orders received. Will continue to monitor.

## 2013-07-19 LAB — RENAL FUNCTION PANEL
Albumin: 3.2 g/dL — ABNORMAL LOW (ref 3.5–5.2)
BUN: 81 mg/dL — ABNORMAL HIGH (ref 6–23)
CO2: 21 meq/L (ref 19–32)
CREATININE: 3.73 mg/dL — AB (ref 0.50–1.10)
Calcium: 9.1 mg/dL (ref 8.4–10.5)
Chloride: 104 mEq/L (ref 96–112)
GFR calc Af Amer: 13 mL/min — ABNORMAL LOW (ref >90)
GFR calc non Af Amer: 11 mL/min — ABNORMAL LOW (ref >90)
GLUCOSE: 108 mg/dL — AB (ref 70–99)
Phosphorus: 5.6 mg/dL — ABNORMAL HIGH (ref 2.3–4.6)
Potassium: 4.3 mEq/L (ref 3.7–5.3)
Sodium: 141 mEq/L (ref 137–147)

## 2013-07-19 LAB — GLUCOSE, CAPILLARY
GLUCOSE-CAPILLARY: 160 mg/dL — AB (ref 70–99)
GLUCOSE-CAPILLARY: 107 mg/dL — AB (ref 70–99)
GLUCOSE-CAPILLARY: 159 mg/dL — AB (ref 70–99)
Glucose-Capillary: 135 mg/dL — ABNORMAL HIGH (ref 70–99)
Glucose-Capillary: 133 mg/dL — ABNORMAL HIGH (ref 70–99)

## 2013-07-19 LAB — MAGNESIUM: Magnesium: 2.8 mg/dL — ABNORMAL HIGH (ref 1.5–2.5)

## 2013-07-19 MED ORDER — LORAZEPAM 0.5 MG PO TABS
0.5000 mg | ORAL_TABLET | Freq: Once | ORAL | Status: AC
  Administered 2013-07-19: 0.5 mg via ORAL
  Filled 2013-07-19: qty 1

## 2013-07-19 NOTE — Progress Notes (Signed)
Patient ID: Nancy Blair, female   DOB: [birth year], 78 y.o.   MRN: [id number]    Subjective:   SOB much improved this morning   Objective:   Temp:  [97.9 F (36.6 C)-98.3 F (36.8 C)] 98.3 F (36.8 C) (04/04 0530) Pulse Rate:  [72-78] 73 (04/04 0530) Resp:  [18-20] 20 (04/04 0530) BP: (98-147)/(38-66) 139/66 mmHg (04/04 0530) SpO2:  [94 %-100 %] 100 % (04/04 0530) Weight:  [132 lb 4.4 oz (60 kg)] 132 lb 4.4 oz (60 kg) (04/04 0530) Last BM Date: 07/17/13  Filed Weights   07/17/13 0506 07/18/13 0430 07/19/13 0530  Weight: 134 lb 4.2 oz (60.9 kg) 134 lb 4.2 oz (60.9 kg) 132 lb 4.4 oz (60 kg)    Intake/Output Summary (Last 24 hours) at 07/19/13 0740 Last data filed at 07/19/13 0523  Gross per 24 hour  Intake    720 ml  Output   1350 ml  Net   -630 ml    Exam:  General:NAD  Resp: CTAB  Cardiac: RRR, 3/6 mid peaking systolic murmur RUSB, no JVD  YQ:IHKVQQV soft, NT, ND  MSK: LE are warm, no edema  Neuro: no focal deficits   Lab Results:  Basic Metabolic Panel:  Recent Labs Lab 07/16/13 2115 07/17/13 0335 07/18/13 0407 07/19/13 0500 07/19/13 0506  NA 138 139 138 141  --   K 5.1 5.3 4.7 4.3  --   CL 100 101 100 104  --   CO2 24 23 22 21   --   GLUCOSE 133* 183* 120* 108*  --   BUN 65* 67* 74* 81*  --   CREATININE 3.26* 3.18* 3.41* 3.73*  --   CALCIUM 9.4 9.4 9.1 9.1  --   MG 2.2  --   --   --  2.8*    Liver Function Tests:  Recent Labs Lab 07/19/13 0500  ALBUMIN 3.2*    CBC:  Recent Labs Lab 07/16/13 1030 07/16/13 1610 07/17/13 0335  WBC 11.3* 11.1* 12.4*  HGB 10.8* 10.5* 10.1*  HCT 33.4* 32.7* 31.4*  MCV 102.1* 103.2* 101.3*  PLT 202 193 198    Cardiac Enzymes: No results found for this basename: CKTOTAL, CKMB, CKMBINDEX, TROPONINI,  in the last 168 hours  BNP:  Recent Labs  07/16/13 1030  PROBNP 26763.0*    Coagulation: No results found for this basename: INR,  in the last 168 hours  ECG:   Medications:     Scheduled Medications: . aspirin  81 mg Oral Daily  . carvedilol  12.5 mg Oral BID WC  . cholecalciferol  1,000 Units Oral Daily  . citalopram  20 mg Oral QHS  . cycloSPORINE modified  50 mg Oral QHS  . cycloSPORINE modified  75 mg Oral Q breakfast  . ferrous sulfate  325 mg Oral Q breakfast  . furosemide  20 mg Intravenous Daily  . heparin  5,000 Units Subcutaneous 3 times per day  . hydrocortisone  20 mg Oral BID AC  . insulin aspart  0-9 Units Subcutaneous TID WC  . levothyroxine  88 mcg Oral QAC breakfast  . magnesium oxide  400 mg Oral Daily  . multivitamin with minerals  1 tablet Oral Daily  . mycophenolate  500 mg Oral BID  . NIFEdipine  30 mg Oral Daily  . pravastatin  20 mg Oral QHS  . sulfamethoxazole-trimethoprim  1 tablet Oral Q M,W,F  . valGANciclovir  450 mg Oral Q M,W,F     Infusions:  PRN Medications:  acetaminophen, acetaminophen, albuterol, hydrALAZINE, ondansetron (ZOFRAN) IV, ondansetron     Assessment/Plan    78 yo female history of lung transplant, HTN, DM, GERD, CKD IV, moderate to severe AS admitted with acute diastolic heart failure.  1. Acute diastolic heart failure - Likely onset as result of combination of uncontrolled HTN, aortic stenosis and renal insufficiency - net negative 630 mL yesterday, total negative 3.5 liters. Cr trending up - progressively worsening Cr and BUN, recommend holding lasix today, resume oral lasix tomorrow. She appears to be euvolemic to hypovolemic.   2. CKD - per nephrology  3. Aortic stenosis - Moderately severe with mean gradient 36 mmHg. This is not critical but certainly could be playing a role in her presentation. Will need to be followed closely as an outpatient. Her outpatient f/u can be here locally with Dr. Ron Parker in our [address] office. Would plan for f/u appt with Dr. Ron Parker in 2-3 weeks post discharge.   4. HTN - appears better controlled     Nancy Blair, M.D., F.A.C.C.

## 2013-07-19 NOTE — Progress Notes (Signed)
Note: This document was prepared with digital dictation and possible smart phrase technology. Any transcriptional errors that result from this process are unintentional.   Nancy Blair QVZ:[id number] DOB: 1935/10/04 DOA: 07/16/2013 PCP: Annye Asa, MD  Brief narrative: 78 y/o ?, former smoker, COPD s/p bilateral orthotopic Lung transplant 06/06/10-last PFT's 07/15/13=Mild Obst defect , GERD s/p Nissen, previous CMV viremia [facility] MOvement disorder, Orthostatic hypotension, CKD stage 3-4 whow as admitted 07/15/13 with SOB. Found to have an elevated BNP 26,763, basilar fluid and lung base opacity on the right Pulmonology saw the patient and recommended diuresis an echocardiogram as he seemed more congestive heart failure >pneumonia Ultimately echocardiogram showed severe aortic stenosis cardiology was consulted to help with management in the setting of renal insufficiency Nephrology was also consulted because of her renal insufficiency  Past medical history-As per Problem list Chart reviewed as below- Reviewed  Consultants:  Pulmonology  Procedures:  Chest x-rays  Echocardiogram 07/16/13 = grade 1 diastolic dysfunction, moderate to severe aortic valve stenosis valve area 0.86 cm , mean gradient 36 mm mercury    Antibiotics:  Levofloxacin 4/1 then stopped Valgancyclovir-chronic Bactrim-chronic   Subjective   Well NO further SOB TOl diet Multiple questions asked and answered denies n/v/fever/chills    Objective    Interim History:   Telemetry:  Normal sinus rhythm   Objective: Filed Vitals:   07/18/13 0800 07/18/13 1409 07/18/13 2134 07/19/13 0530  BP: 147/63 126/55 98/38 139/66  Pulse:  72 78 73  Temp:  98.2 F (36.8 C) 97.9 F (36.6 C) 98.3 F (36.8 C)  TempSrc:  Oral Oral Oral  Resp:  18 20 20   Height:      Weight:    60 kg (132 lb 4.4 oz)  SpO2:  94% 100% 100%    Intake/Output Summary (Last 24 hours) at 07/19/13 1022 Last data filed at 07/19/13  0900  Gross per 24 hour  Intake    720 ml  Output   1350 ml  Net   -630 ml    Exam:  General:  Alert pleasant oriented no apparent distress Cardiovascular:  S1-S2, grade 2 murmur left upper sternal edge Respiratory:  Clinically clear Abdomen:  Soft nontender nondistended Skin no lower extremity edema Neuro intact  Data Reviewed: Basic Metabolic Panel:  Recent Labs Lab 07/16/13 1030 07/16/13 2115 07/17/13 0335 07/18/13 0407 07/19/13 0500 07/19/13 0506  NA 137 138 139 138 141  --   K 3.9 5.1 5.3 4.7 4.3  --   CL 100 100 101 100 104  --   CO2 20 24 23 22 21   --   GLUCOSE 137* 133* 183* 120* 108*  --   BUN 52* 65* 67* 74* 81*  --   CREATININE 2.54* 3.26* 3.18* 3.41* 3.73*  --   CALCIUM 9.3 9.4 9.4 9.1 9.1  --   MG  --  2.2  --   --   --  2.8*  PHOS  --   --   --   --  5.6*  --    Liver Function Tests:  Recent Labs Lab 07/19/13 0500  ALBUMIN 3.2*   No results found for this basename: LIPASE, AMYLASE,  in the last 168 hours No results found for this basename: AMMONIA,  in the last 168 hours CBC:  Recent Labs Lab 07/16/13 1030 07/16/13 1610 07/17/13 0335  WBC 11.3* 11.1* 12.4*  HGB 10.8* 10.5* 10.1*  HCT 33.4* 32.7* 31.4*  MCV 102.1* 103.2* 101.3*  PLT 202  193 198   Cardiac Enzymes: No results found for this basename: CKTOTAL, CKMB, CKMBINDEX, TROPONINI,  in the last 168 hours BNP: No components found with this basename: POCBNP,  CBG:  Recent Labs Lab 07/18/13 0746 07/18/13 1146 07/18/13 1651 07/18/13 2122 07/19/13 0727  GLUCAP 102* 116* 160* 160* 107*    Recent Results (from the past 240 hour(s))  CULTURE, BLOOD (ROUTINE X 2)     Status: None   Collection Time    07/16/13  4:00 PM      Result Value Ref Range Status   Specimen Description BLOOD RIGHT HAND   Final   Special Requests BOTTLES DRAWN AEROBIC AND ANAEROBIC 10CC   Final   Culture  Setup Time     Final   Value: 07/16/2013 20:45     Performed at [facility]   Culture      Final   Value:        BLOOD CULTURE RECEIVED NO GROWTH TO DATE CULTURE WILL BE HELD FOR 5 DAYS BEFORE ISSUING A FINAL NEGATIVE REPORT     Performed at [facility]   Report Status PENDING   Incomplete  CULTURE, BLOOD (ROUTINE X 2)     Status: None   Collection Time    07/16/13  4:11 PM      Result Value Ref Range Status   Specimen Description BLOOD RIGHT ARM   Final   Special Requests BOTTLES DRAWN AEROBIC AND ANAEROBIC 10CC   Final   Culture  Setup Time     Final   Value: 07/16/2013 20:45     Performed at [facility]   Culture     Final   Value:        BLOOD CULTURE RECEIVED NO GROWTH TO DATE CULTURE WILL BE HELD FOR 5 DAYS BEFORE ISSUING A FINAL NEGATIVE REPORT     Performed at [facility]   Report Status PENDING   Incomplete     Studies:              All Imaging reviewed and is as per above notation   Scheduled Meds: . aspirin  81 mg Oral Daily  . carvedilol  12.5 mg Oral BID WC  . cholecalciferol  1,000 Units Oral Daily  . citalopram  20 mg Oral QHS  . cycloSPORINE modified  50 mg Oral QHS  . cycloSPORINE modified  75 mg Oral Q breakfast  . ferrous sulfate  325 mg Oral Q breakfast  . heparin  5,000 Units Subcutaneous 3 times per day  . hydrocortisone  20 mg Oral BID AC  . insulin aspart  0-9 Units Subcutaneous TID WC  . levothyroxine  88 mcg Oral QAC breakfast  . magnesium oxide  400 mg Oral Daily  . multivitamin with minerals  1 tablet Oral Daily  . mycophenolate  500 mg Oral BID  . NIFEdipine  30 mg Oral Daily  . pravastatin  20 mg Oral QHS  . sulfamethoxazole-trimethoprim  1 tablet Oral Q M,W,F  . valGANciclovir  450 mg Oral Q M,W,F   Continuous Infusions:    Assessment/Plan: 1.  Acute hypoxic respiratory failure secondary to Decompensated acute new onset diastolic dysfunction in the setting of severe aortic stenosis-appreciate consultant input-nephrology has changed her Lasix to 20 mg IV daily which has been held  4/3.Continue Coreg 12.5  twice a day, -3.21 L  2. Severe aortic stenosis-cardiology consulted. Potentially may benefit from outpatient cardiac catheterization/TVR-defer to cardiology as an outpatient  3. S/p BOLT 2012-continue antirejection medications including cyclosporine 75 twice a day, hydrocortisone twice a day, CellCept 500 twice a day, valganciclovir 450 every other day , Bactrim 1 tablet Monday Wednesday Friday.   4. Stable COPD 5. Grade 5 chronic kidney disease with acute injury component secondary to IV Lasix-appreciate Nephrology note-lasix held 4/5 as worsening Kidney function.  Await cyclosporine level 9 AM 07/19/13-patient has very real risk of needing dialysis and is amenable to the same if needed-Prograf level ordered from this am are pending 6. Hypertension continue hydralazine IV 10 mg daily when necessary pressure is elevated-amlodipine replaced with nifedipine for nephrology--might need to cut back dose-defer to nephrology 7. Leukocytosis-likely secondary to chronic steroid therapy 8. Likely anemia of chronic disease vs. macrocytic anemia-monitor-continue iron 325 daily 9. History syncope orthostasis-stable at present 10. History of GERD with Nissen fundoplication-monitor 11. History hypothyroidism continue levothyroxine 88 mcg 12. Possible chronic adrenal insufficiency-continue hydrocortisone 20 twice a day 13. Hyperlipidemia continue statin 20 mg each bedtime 14.  depression continue citalopram 20 each bedtime  Code Status: Full Family Communication: Discussed with family and patient at bedside in person Disposition Plan: Inpatient  >30 min  Verneita Griffes, MD  [facility] Pager [phone] 07/19/2013, 10:22 AM    LOS: 3 days

## 2013-07-19 NOTE — Progress Notes (Signed)
Patient ID: Nancy Blair, female   DOB: [birth year], 78 y.o.   MRN: [id number]   [facility] Progress Note    Assessment/ Plan:   1. Acute renal failure chronic kidney disease stage IV-chronic kidney disease appears to be multifactorial from underlying hypertension/diabetes and probably CNI toxicity. Renal function slightly worse today after aggressive diuresis/intravascular depletion. Agree with holding lasix today and allowing re-equilibration of fluids. 2. CHF exacerbation/aortic stenosis: symptoms better with diuresis, holding lasix today.  3. Hypertension: BP trend improving- monitor for hypotension (relative hypotension this AM).  4. Anemia: Appears to be anemia of chronic illness, will check iron stores  5. Status post bilateral lung transplant: On antirejection therapy with CellCept and cyclosporine, steroid free regime. OI prophylaxis with Bactrim/Valcyte. CsA levels drawn today.  Subjective:   Reports to be feeling well with improved SOB   Objective:   BP 139/66  Pulse 73  Temp(Src) 98.3 F (36.8 C) (Oral)  Resp 20  Ht 5\' 2"  (1.575 m)  Wt 60 kg (132 lb 4.4 oz)  BMI 24.19 kg/m2  SpO2 100%  Intake/Output Summary (Last 24 hours) at 07/19/13 1216 Last data filed at 07/19/13 0900  Gross per 24 hour  Intake    720 ml  Output    800 ml  Net    -80 ml   Weight change: -0.9 kg (-1 lb 15.7 oz)  Physical Exam: WCH:[id number] resting in bed TIR:WERXV RRR, 3/6 HSM Resp:CTA bilaterally, no rales/rhonchi QMG:QQPY, flat, NT, BS normal Ext:No LE edema  Imaging: US Renal  07/18/2013   CLINICAL DATA:  Acute on chronic renal failure.  EXAM: RENAL/URINARY TRACT ULTRASOUND COMPLETE  COMPARISON:  None.  FINDINGS: Right Kidney:  Length: 9.6 cm. Echogenicity within normal limits. No mass or hydronephrosis visualized.  Left Kidney:  Length: 9.3 cm. Echogenicity within normal limits. No mass or hydronephrosis visualized.  Bladder:  Appears normal for degree of bladder  distention.  IMPRESSION: Unremarkable appearance of the kidneys.  No hydronephrosis.   Electronically Signed   By: Logan Bores   On: 07/18/2013 14:52    Labs: BMET  Recent Labs Lab 07/16/13 1030 07/16/13 2115 07/17/13 0335 07/18/13 0407 07/19/13 0500  NA 137 138 139 138 141  K 3.9 5.1 5.3 4.7 4.3  CL 100 100 101 100 104  CO2 20 24 23 22 21   GLUCOSE 137* 133* 183* 120* 108*  BUN 52* 65* 67* 74* 81*  CREATININE 2.54* 3.26* 3.18* 3.41* 3.73*  CALCIUM 9.3 9.4 9.4 9.1 9.1  PHOS  --   --   --   --  5.6*   CBC  Recent Labs Lab 07/16/13 1030 07/16/13 1610 07/17/13 0335  WBC 11.3* 11.1* 12.4*  HGB 10.8* 10.5* 10.1*  HCT 33.4* 32.7* 31.4*  MCV 102.1* 103.2* 101.3*  PLT 202 193 198   Medications:    . aspirin  81 mg Oral Daily  . carvedilol  12.5 mg Oral BID WC  . cholecalciferol  1,000 Units Oral Daily  . citalopram  20 mg Oral QHS  . cycloSPORINE modified  50 mg Oral QHS  . cycloSPORINE modified  75 mg Oral Q breakfast  . ferrous sulfate  325 mg Oral Q breakfast  . heparin  5,000 Units Subcutaneous 3 times per day  . hydrocortisone  20 mg Oral BID AC  . insulin aspart  0-9 Units Subcutaneous TID WC  . levothyroxine  88 mcg Oral QAC breakfast  . magnesium oxide  400 mg Oral Daily  .  multivitamin with minerals  1 tablet Oral Daily  . mycophenolate  500 mg Oral BID  . NIFEdipine  30 mg Oral Daily  . pravastatin  20 mg Oral QHS  . sulfamethoxazole-trimethoprim  1 tablet Oral Q M,W,F  . valGANciclovir  450 mg Oral Q M,W,F   Elmarie Shiley, MD 07/19/2013, 12:16 PM

## 2013-07-20 ENCOUNTER — Other Ambulatory Visit: Payer: Self-pay | Admitting: Family Medicine

## 2013-07-20 LAB — GLUCOSE, CAPILLARY
Glucose-Capillary: 118 mg/dL — ABNORMAL HIGH (ref 70–99)
Glucose-Capillary: 137 mg/dL — ABNORMAL HIGH (ref 70–99)
Glucose-Capillary: 173 mg/dL — ABNORMAL HIGH (ref 70–99)
Glucose-Capillary: 238 mg/dL — ABNORMAL HIGH (ref 70–99)

## 2013-07-20 LAB — RENAL FUNCTION PANEL
ALBUMIN: 3 g/dL — AB (ref 3.5–5.2)
BUN: 81 mg/dL — ABNORMAL HIGH (ref 6–23)
CHLORIDE: 103 meq/L (ref 96–112)
CO2: 24 mEq/L (ref 19–32)
Calcium: 9 mg/dL (ref 8.4–10.5)
Creatinine, Ser: 3.62 mg/dL — ABNORMAL HIGH (ref 0.50–1.10)
GFR calc Af Amer: 13 mL/min — ABNORMAL LOW (ref >90)
GFR, EST NON AFRICAN AMERICAN: 11 mL/min — AB (ref >90)
Glucose, Bld: 121 mg/dL — ABNORMAL HIGH (ref 70–99)
PHOSPHORUS: 5.2 mg/dL — AB (ref 2.3–4.6)
Potassium: 4.4 mEq/L (ref 3.7–5.3)
SODIUM: 140 meq/L (ref 137–147)

## 2013-07-20 LAB — PROCALCITONIN

## 2013-07-20 LAB — MAGNESIUM: Magnesium: 2.6 mg/dL — ABNORMAL HIGH (ref 1.5–2.5)

## 2013-07-20 MED ORDER — ISOSORBIDE DINITRATE 5 MG PO TABS
5.0000 mg | ORAL_TABLET | Freq: Three times a day (TID) | ORAL | Status: DC
  Administered 2013-07-20: 5 mg via ORAL
  Filled 2013-07-20 (×5): qty 1

## 2013-07-20 MED ORDER — SODIUM CHLORIDE 0.9 % IV BOLUS (SEPSIS)
500.0000 mL | Freq: Once | INTRAVENOUS | Status: AC
  Administered 2013-07-20: 500 mL via INTRAVENOUS

## 2013-07-20 NOTE — Progress Notes (Signed)
Note: This document was prepared with digital dictation and possible smart phrase technology. Any transcriptional errors that result from this process are unintentional.   Nancy Blair ACZ:[id number] DOB: 1935-12-20 DOA: 07/16/2013 PCP: Annye Asa, MD  Brief narrative: 78 y/o ?, former smoker, COPD s/p bilateral orthotopic Lung transplant 06/06/10-last PFT's 07/15/13=Mild Obst defect , GERD s/p Nissen, previous CMV viremia [facility] MOvement disorder, Orthostatic hypotension, CKD stage 3-4 whow as admitted 07/15/13 with SOB. Found to have an elevated BNP 26,763, basilar fluid and lung base opacity on the right Pulmonology saw the patient and recommended diuresis an echocardiogram as he seemed more congestive heart failure >pneumonia Ultimately echocardiogram showed severe aortic stenosis cardiology was consulted to help with management in the setting of renal insufficiency Nephrology was also consulted because of her renal insufficiency  Past medical history-As per Problem list Chart reviewed as below- Reviewed  Consultants:  Pulmonology  Procedures:  Chest x-rays  Echocardiogram 07/16/13 = grade 1 diastolic dysfunction, moderate to severe aortic valve stenosis valve area 0.86 cm , mean gradient 36 mm mercury    Antibiotics:  Levofloxacin 4/1 then stopped Valgancyclovir-chronic Bactrim-chronic   Subjective   Dizzy later on in the day today with low blood pressure Tol diet NO SOb, eating and drinking Family at bedside   Objective    Interim History:   Telemetry:  Normal sinus rhythm   Objective: Filed Vitals:   07/20/13 1339 07/20/13 1343 07/20/13 1346 07/20/13 1406  BP: 107/47 98/52 79/44  123/51  Pulse: 68 68 71 68  Temp: 98.2 F (36.8 C)   98.4 F (36.9 C)  TempSrc: Oral   Oral  Resp: 16   16  Height:      Weight:      SpO2: 99%   99%    Intake/Output Summary (Last 24 hours) at 07/20/13 1428 Last data filed at 07/20/13 0900  Gross per 24 hour  Intake     240 ml  Output   1075 ml  Net   -835 ml    Exam:  General:  Alert pleasant oriented no apparent distress Cardiovascular:  S1-S2, grade 2 murmur left upper sternal edge Respiratory:  Clinically clear Abdomen:  Soft nontender nondistended Skin no lower extremity edema Neuro intact  Data Reviewed: Basic Metabolic Panel:  Recent Labs Lab 07/16/13 2115 07/17/13 0335 07/18/13 0407 07/19/13 0500 07/19/13 0506 07/20/13 0417  NA 138 139 138 141  --  140  K 5.1 5.3 4.7 4.3  --  4.4  CL 100 101 100 104  --  103  CO2 24 23 22 21   --  24  GLUCOSE 133* 183* 120* 108*  --  121*  BUN 65* 67* 74* 81*  --  81*  CREATININE 3.26* 3.18* 3.41* 3.73*  --  3.62*  CALCIUM 9.4 9.4 9.1 9.1  --  9.0  MG 2.2  --   --   --  2.8* 2.6*  PHOS  --   --   --  5.6*  --  5.2*   Liver Function Tests:  Recent Labs Lab 07/19/13 0500 07/20/13 0417  ALBUMIN 3.2* 3.0*   No results found for this basename: LIPASE, AMYLASE,  in the last 168 hours No results found for this basename: AMMONIA,  in the last 168 hours CBC:  Recent Labs Lab 07/16/13 1030 07/16/13 1610 07/17/13 0335  WBC 11.3* 11.1* 12.4*  HGB 10.8* 10.5* 10.1*  HCT 33.4* 32.7* 31.4*  MCV 102.1* 103.2* 101.3*  PLT 202 193 198  Cardiac Enzymes: No results found for this basename: CKTOTAL, CKMB, CKMBINDEX, TROPONINI,  in the last 168 hours BNP: No components found with this basename: POCBNP,  CBG:  Recent Labs Lab 07/19/13 1147 07/19/13 1701 07/19/13 2110 07/20/13 0723 07/20/13 1148  GLUCAP 135* 159* 133* 118* 137*    Recent Results (from the past 240 hour(s))  CULTURE, BLOOD (ROUTINE X 2)     Status: None   Collection Time    07/16/13  4:00 PM      Result Value Ref Range Status   Specimen Description BLOOD RIGHT HAND   Final   Special Requests BOTTLES DRAWN AEROBIC AND ANAEROBIC 10CC   Final   Culture  Setup Time     Final   Value: 07/16/2013 20:45     Performed at [facility]   Culture     Final    Value:        BLOOD CULTURE RECEIVED NO GROWTH TO DATE CULTURE WILL BE HELD FOR 5 DAYS BEFORE ISSUING A FINAL NEGATIVE REPORT     Performed at [facility]   Report Status PENDING   Incomplete  CULTURE, BLOOD (ROUTINE X 2)     Status: None   Collection Time    07/16/13  4:11 PM      Result Value Ref Range Status   Specimen Description BLOOD RIGHT ARM   Final   Special Requests BOTTLES DRAWN AEROBIC AND ANAEROBIC 10CC   Final   Culture  Setup Time     Final   Value: 07/16/2013 20:45     Performed at [facility]   Culture     Final   Value:        BLOOD CULTURE RECEIVED NO GROWTH TO DATE CULTURE WILL BE HELD FOR 5 DAYS BEFORE ISSUING A FINAL NEGATIVE REPORT     Performed at [facility]   Report Status PENDING   Incomplete     Studies:              All Imaging reviewed and is as per above notation   Scheduled Meds: . aspirin  81 mg Oral Daily  . carvedilol  12.5 mg Oral BID WC  . cholecalciferol  1,000 Units Oral Daily  . citalopram  20 mg Oral QHS  . cycloSPORINE modified  50 mg Oral QHS  . cycloSPORINE modified  75 mg Oral Q breakfast  . ferrous sulfate  325 mg Oral Q breakfast  . heparin  5,000 Units Subcutaneous 3 times per day  . hydrocortisone  20 mg Oral BID AC  . insulin aspart  0-9 Units Subcutaneous TID WC  . levothyroxine  88 mcg Oral QAC breakfast  . magnesium oxide  400 mg Oral Daily  . multivitamin with minerals  1 tablet Oral Daily  . mycophenolate  500 mg Oral BID  . NIFEdipine  30 mg Oral Daily  . pravastatin  20 mg Oral QHS  . sodium chloride  500 mL Intravenous Once  . sulfamethoxazole-trimethoprim  1 tablet Oral Q M,W,F  . valGANciclovir  450 mg Oral Q M,W,F   Continuous Infusions:    Assessment/Plan: 1.  Acute hypoxic respiratory failure secondary to Decompensated acute new onset diastolic dysfunction in the setting of severe aortic stenosis-appreciate consultant input-nephrology has changed her Lasix to 20 mg IV daily which  has been held  4/3. Continue Coreg 12.5 twice a day, -4.15 L out so far today-Lasix to be given when and if Creatinine resolves.  2. Severe aortic stenosis-cardiology consulted. Potentially may benefit from outpatient cardiac catheterization/TVR-defer to cardiology as an outpatient  3. S/p BOLT 2012-continue antirejection medications including cyclosporine 75 twice a day, hydrocortisone twice a day, CellCept 500 twice a day, valganciclovir 450 every other day , Bactrim 1 tablet Monday Wednesday Friday.   4. Stable COPD 5. Grade 5 chronic kidney disease with acute injury component secondary to IV Lasix-appreciate Nephrology note-lasix held 4/5 as worsening Kidney function.  Await cyclosporine level 9 AM 07/19/13-patient has very real risk of needing dialysis and is amenable to the same if needed-Prograf level ordered pending 6. Hypertension continue hydralazine IV 10 mg daily when necessary pressure is elevated-amlodipine replaced with nifedipine for nephrology--as hypotensive change to isordil 5 tid-needed IVF bolus 07/20/13 500 cc but resolved 7. Leukocytosis-likely secondary to chronic steroid therapy 8. Likely anemia of chronic disease vs. macrocytic anemia-monitor-continue iron 325 daily 9. History syncope orthostasis-stable at present 10. History of GERD with Nissen fundoplication-monitor 11. History hypothyroidism continue levothyroxine 88 mcg 12. Possible chronic adrenal insufficiency-continue hydrocortisone 20 twice a day 13. Hyperlipidemia continue statin 20 mg each bedtime 14.  depression continue citalopram 20 each bedtime  Code Status: Full Family Communication: Discussed with family and patient at bedside in person Disposition Plan: Inpatient  >30 min  Verneita Griffes, MD  [facility] Pager [phone] 07/20/2013, 2:28 PM    LOS: 4 days

## 2013-07-20 NOTE — Progress Notes (Signed)
Patient ID: Nancy Blair, female   DOB: [birth year], 78 y.o.   MRN: [id number]     Subjective:    Denies any SOB. Reports she ambulated several times yesterday without troubles.   Objective:   Temp:  [97.8 F (36.6 C)-99 F (37.2 C)] 97.9 F (36.6 C) (04/05 0602) Pulse Rate:  [70-72] 71 (04/05 0602) Resp:  [16-20] 20 (04/05 0602) BP: (102-162)/(41-89) 162/89 mmHg (04/05 0602) SpO2:  [98 %-100 %] 100 % (04/05 0602) Weight:  [132 lb 14.4 oz (60.283 kg)] 132 lb 14.4 oz (60.283 kg) (04/05 0602) Last BM Date: 07/19/13  Filed Weights   07/18/13 0430 07/19/13 0530 07/20/13 0602  Weight: 134 lb 4.2 oz (60.9 kg) 132 lb 4.4 oz (60 kg) 132 lb 14.4 oz (60.283 kg)    Intake/Output Summary (Last 24 hours) at 07/20/13 0732 Last data filed at 07/19/13 2300  Gross per 24 hour  Intake    480 ml  Output    825 ml  Net   -345 ml    Telemetry: NSR  Exam:  General: NAD  Resp: CTAB  Cardiac: RRR, 3/6 systolic murmur RUSB, no JVD  UV:[id number] soft, NT, ND  MSK: LEs are warm, no edema  Neuro: no focal deficits    Lab Results:  Basic Metabolic Panel:  Recent Labs Lab 07/16/13 2115  07/18/13 0407 07/19/13 0500 07/19/13 0506 07/20/13 0417  NA 138  < > 138 141  --  140  K 5.1  < > 4.7 4.3  --  4.4  CL 100  < > 100 104  --  103  CO2 24  < > 22 21  --  24  GLUCOSE 133*  < > 120* 108*  --  121*  BUN 65*  < > 74* 81*  --  81*  CREATININE 3.26*  < > 3.41* 3.73*  --  3.62*  CALCIUM 9.4  < > 9.1 9.1  --  9.0  MG 2.2  --   --   --  2.8* 2.6*  < > = values in this interval not displayed.  Liver Function Tests:  Recent Labs Lab 07/19/13 0500 07/20/13 0417  ALBUMIN 3.2* 3.0*    CBC:  Recent Labs Lab 07/16/13 1030 07/16/13 1610 07/17/13 0335  WBC 11.3* 11.1* 12.4*  HGB 10.8* 10.5* 10.1*  HCT 33.4* 32.7* 31.4*  MCV 102.1* 103.2* 101.3*  PLT 202 193 198    Cardiac Enzymes: No results found for this basename: CKTOTAL, CKMB, CKMBINDEX, TROPONINI,  in the last 168  hours  BNP:  Recent Labs  07/16/13 1030  PROBNP 26763.0*    Coagulation: No results found for this basename: INR,  in the last 168 hours  ECG:   Medications:   Scheduled Medications: . aspirin  81 mg Oral Daily  . carvedilol  12.5 mg Oral BID WC  . cholecalciferol  1,000 Units Oral Daily  . citalopram  20 mg Oral QHS  . cycloSPORINE modified  50 mg Oral QHS  . cycloSPORINE modified  75 mg Oral Q breakfast  . ferrous sulfate  325 mg Oral Q breakfast  . heparin  5,000 Units Subcutaneous 3 times per day  . hydrocortisone  20 mg Oral BID AC  . insulin aspart  0-9 Units Subcutaneous TID WC  . levothyroxine  88 mcg Oral QAC breakfast  . magnesium oxide  400 mg Oral Daily  . multivitamin with minerals  1 tablet Oral Daily  . mycophenolate  500 mg Oral BID  .  NIFEdipine  30 mg Oral Daily  . pravastatin  20 mg Oral QHS  . sulfamethoxazole-trimethoprim  1 tablet Oral Q M,W,F  . valGANciclovir  450 mg Oral Q M,W,F     Infusions:     PRN Medications:  acetaminophen, acetaminophen, albuterol, hydrALAZINE, ondansetron (ZOFRAN) IV, ondansetron     Assessment/Plan    78 yo female history of lung transplant, HTN, DM, GERD, CKD IV, moderate to severe AS admitted with acute diastolic heart failure.   1. Acute diastolic heart failure  - Likely onset as result of combination of uncontrolled HTN, aortic stenosis and renal insufficiency  - net negative 345 mL yesterday, total negative 3.8 liters. Cr down from yesterday after holding lasix.  - Cr somewhat labile, from old labs primarily around 2.5-2.6.  - I suspect she is euvolemic and likely a little volume down, recommend holding lasix again today and likely starting oral tomorrow.   2. CKD  - per nephrology   3. Aortic stenosis  - Moderately severe with mean gradient 36 mmHg. This is not critical but certainly could be playing a role in her presentation. Will need to be followed closely as an outpatient. Her outpatient f/u  can be here locally with Dr. Ron Parker in our [address] office. Would plan for f/u appt with Dr. Ron Parker in 2-3 weeks post discharge.  - avoid overdiuresis in setting of stenotic AV.  4. HTN  - somewhat labile, majority of numbers are at goal. Continue to follow for now.         [name], M.D., F.A.C.C.

## 2013-07-20 NOTE — Progress Notes (Signed)
Patient ID: Nancy Blair, female   DOB: 03-27-1936, 78 y.o.   MRN: [id number]   [facility] Progress Note    Assessment/ Plan:   1. Acute renal failure chronic kidney disease stage IV-chronic kidney disease appears to be multifactorial from underlying hypertension/diabetes and probably CNI toxicity. Renal function unchanged since yesterday after holding lasix. Will restart lasix tomorrow (20mg  daily) if renal function improves. Clinically, her volume status is acceptable. CsA levels pending. 2. CHF exacerbation/aortic stenosis: symptoms better with diuresis, holding lasix with AKI.  3. Hypertension: BP trend improving- monitor for CCB associated pedal edema.  4. Anemia: Appears to be anemia of chronic illness, iron stores pending  5. Status post bilateral lung transplant: On antirejection therapy with CellCept and cyclosporine, steroid free regime. OI prophylaxis with Bactrim/Valcyte. CsA levels drawn today.  Subjective:   Reports to be feeling better and denies any chest pain. Ambulated without problems- single episode of possibly orthostatic dizziness last PM).   Objective:   BP 114/44  Pulse 69  Temp(Src) 97.5 F (36.4 C) (Oral)  Resp 18  Ht 5\' 2"  (1.575 m)  Wt 60.283 kg (132 lb 14.4 oz)  BMI 24.30 kg/m2  SpO2 100%  Intake/Output Summary (Last 24 hours) at 07/20/13 1150 Last data filed at 07/20/13 0900  Gross per 24 hour  Intake    480 ml  Output   1425 ml  Net   -945 ml   Weight change: 0.283 kg (10 oz)  Physical Exam: VHQ:[id number] resting in bed- family by bedside LKG:MWNUU RRR, 2/6 HSM over outflow tract Resp:CTA bilaterally, no rales/rhonchi VOZ:DGUY, flat, NT, BS normal Ext:No LE edema  Imaging: US Renal  07/18/2013   CLINICAL DATA:  Acute on chronic renal failure.  EXAM: RENAL/URINARY TRACT ULTRASOUND COMPLETE  COMPARISON:  None.  FINDINGS: Right Kidney:  Length: 9.6 cm. Echogenicity within normal limits. No mass or hydronephrosis visualized.   Left Kidney:  Length: 9.3 cm. Echogenicity within normal limits. No mass or hydronephrosis visualized.  Bladder:  Appears normal for degree of bladder distention.  IMPRESSION: Unremarkable appearance of the kidneys.  No hydronephrosis.   Electronically Signed   By: Logan Bores   On: 07/18/2013 14:52    Labs: BMET  Recent Labs Lab 07/16/13 1030 07/16/13 2115 07/17/13 0335 07/18/13 0407 07/19/13 0500 07/20/13 0417  NA 137 138 139 138 141 140  K 3.9 5.1 5.3 4.7 4.3 4.4  CL 100 100 101 100 104 103  CO2 20 24 23 22 21 24   GLUCOSE 137* 133* 183* 120* 108* 121*  BUN 52* 65* 67* 74* 81* 81*  CREATININE 2.54* 3.26* 3.18* 3.41* 3.73* 3.62*  CALCIUM 9.3 9.4 9.4 9.1 9.1 9.0  PHOS  --   --   --   --  5.6* 5.2*   CBC  Recent Labs Lab 07/16/13 1030 07/16/13 1610 07/17/13 0335  WBC 11.3* 11.1* 12.4*  HGB 10.8* 10.5* 10.1*  HCT 33.4* 32.7* 31.4*  MCV 102.1* 103.2* 101.3*  PLT 202 193 198   Medications:    . aspirin  81 mg Oral Daily  . carvedilol  12.5 mg Oral BID WC  . cholecalciferol  1,000 Units Oral Daily  . citalopram  20 mg Oral QHS  . cycloSPORINE modified  50 mg Oral QHS  . cycloSPORINE modified  75 mg Oral Q breakfast  . ferrous sulfate  325 mg Oral Q breakfast  . heparin  5,000 Units Subcutaneous 3 times per day  . hydrocortisone  20  mg Oral BID AC  . insulin aspart  0-9 Units Subcutaneous TID WC  . levothyroxine  88 mcg Oral QAC breakfast  . magnesium oxide  400 mg Oral Daily  . multivitamin with minerals  1 tablet Oral Daily  . mycophenolate  500 mg Oral BID  . NIFEdipine  30 mg Oral Daily  . pravastatin  20 mg Oral QHS  . sulfamethoxazole-trimethoprim  1 tablet Oral Q M,W,F  . valGANciclovir  450 mg Oral Q M,W,F   Elmarie Shiley, MD 07/20/2013, 11:50 AM

## 2013-07-21 ENCOUNTER — Encounter (HOSPITAL_COMMUNITY): Payer: Self-pay | Admitting: Interventional Cardiology

## 2013-07-21 DIAGNOSIS — I951 Orthostatic hypotension: Secondary | ICD-10-CM

## 2013-07-21 DIAGNOSIS — I5032 Chronic diastolic (congestive) heart failure: Secondary | ICD-10-CM | POA: Diagnosis present

## 2013-07-21 DIAGNOSIS — J9801 Acute bronchospasm: Secondary | ICD-10-CM

## 2013-07-21 LAB — IRON AND TIBC
IRON: 82 ug/dL (ref 42–135)
Saturation Ratios: 30 % (ref 20–55)
TIBC: 277 ug/dL (ref 250–470)
UIBC: 195 ug/dL (ref 125–400)

## 2013-07-21 LAB — MAGNESIUM: MAGNESIUM: 2.6 mg/dL — AB (ref 1.5–2.5)

## 2013-07-21 LAB — RENAL FUNCTION PANEL
ALBUMIN: 2.8 g/dL — AB (ref 3.5–5.2)
BUN: 76 mg/dL — ABNORMAL HIGH (ref 6–23)
CHLORIDE: 105 meq/L (ref 96–112)
CO2: 20 mEq/L (ref 19–32)
CREATININE: 2.92 mg/dL — AB (ref 0.50–1.10)
Calcium: 8.5 mg/dL (ref 8.4–10.5)
GFR calc Af Amer: 17 mL/min — ABNORMAL LOW (ref >90)
GFR calc non Af Amer: 15 mL/min — ABNORMAL LOW (ref >90)
Glucose, Bld: 130 mg/dL — ABNORMAL HIGH (ref 70–99)
PHOSPHORUS: 4.4 mg/dL (ref 2.3–4.6)
POTASSIUM: 4.1 meq/L (ref 3.7–5.3)
Sodium: 138 mEq/L (ref 137–147)

## 2013-07-21 LAB — CBC
HEMATOCRIT: 30.5 % — AB (ref 36.0–46.0)
Hemoglobin: 9.7 g/dL — ABNORMAL LOW (ref 12.0–15.0)
MCH: 32.3 pg (ref 26.0–34.0)
MCHC: 31.8 g/dL (ref 30.0–36.0)
MCV: 101.7 fL — AB (ref 78.0–100.0)
PLATELETS: 174 10*3/uL (ref 150–400)
RBC: 3 MIL/uL — ABNORMAL LOW (ref 3.87–5.11)
RDW: 14.2 % (ref 11.5–15.5)
WBC: 8.2 10*3/uL (ref 4.0–10.5)

## 2013-07-21 LAB — CYCLOSPORINE LEVEL: Cyclosporine: 99 ng/mL — ABNORMAL LOW (ref 140–330)

## 2013-07-21 LAB — GLUCOSE, CAPILLARY: GLUCOSE-CAPILLARY: 139 mg/dL — AB (ref 70–99)

## 2013-07-21 LAB — FERRITIN: FERRITIN: 102 ng/mL (ref 10–291)

## 2013-07-21 MED ORDER — ISOSORBIDE MONONITRATE ER 30 MG PO TB24: 15.0000 mg | ORAL_TABLET | Freq: Every day | ORAL | Status: DC

## 2013-07-21 NOTE — Progress Notes (Signed)
MD updated BP 95/43 Pt asymptomatic no c/o dizziness. Hold AM Isordil and recheck BP

## 2013-07-21 NOTE — Telephone Encounter (Signed)
Med denied, due to pt being in hospital.

## 2013-07-21 NOTE — Progress Notes (Signed)
SUBJECTIVE:  Feels well.  OBJECTIVE:   Vitals:   Filed Vitals:   07/21/13 0532 07/21/13 0535 07/21/13 1000 07/21/13 1100  BP: 169/65 163/60 95/43 102/46  Pulse: 74 66 68 63  Temp: 97.6 F (36.4 C)  98.3 F (36.8 C) 98.6 F (37 C)  TempSrc: Oral  Oral Oral  Resp: 20   18  Height:      Weight: 135 lb 9.6 oz (61.508 kg)     SpO2: 99%   99%   I&O's:   Intake/Output Summary (Last 24 hours) at 07/21/13 1214 Last data filed at 07/21/13 0900  Gross per 24 hour  Intake   1360 ml  Output   1101 ml  Net    259 ml   TELEMETRY: Reviewed telemetry pt in NSR:     PHYSICAL EXAM General: Well developed, well nourished, in no acute distress Head:   Normal cephalic and atramatic  Lungs:   Clear bilaterally to auscultation. Heart:   HRRR S1 S2  No JVD.   Abdomen: abdomen soft and non-tender Msk:  Back normal,  Normal strength and tone for age. Extremities:   No edema.   Neuro: Alert and oriented. Psych:  Normal affect, responds appropriately   LABS: Basic Metabolic Panel:  Recent Labs  07/20/13 0417 07/21/13 0403  NA 140 138  K 4.4 4.1  CL 103 105  CO2 24 20  GLUCOSE 121* 130*  BUN 81* 76*  CREATININE 3.62* 2.92*  CALCIUM 9.0 8.5  MG 2.6* 2.6*  PHOS 5.2* 4.4   Liver Function Tests:  Recent Labs  07/20/13 0417 07/21/13 0403  ALBUMIN 3.0* 2.8*   No results found for this basename: LIPASE, AMYLASE,  in the last 72 hours CBC:  Recent Labs  07/21/13 0403  WBC 8.2  HGB 9.7*  HCT 30.5*  MCV 101.7*  PLT 174   Cardiac Enzymes: No results found for this basename: CKTOTAL, CKMB, CKMBINDEX, TROPONINI,  in the last 72 hours BNP: No components found with this basename: POCBNP,  D-Dimer: No results found for this basename: DDIMER,  in the last 72 hours Hemoglobin A1C: No results found for this basename: HGBA1C,  in the last 72 hours Fasting Lipid Panel: No results found for this basename: CHOL, HDL, LDLCALC, TRIG, CHOLHDL, LDLDIRECT,  in the last 72  hours Thyroid Function Tests: No results found for this basename: TSH, T4TOTAL, FREET3, T3FREE, THYROIDAB,  in the last 72 hours Anemia Panel:  Recent Labs  07/20/13 0417  FERRITIN 102  TIBC 277  IRON 82   Coag Panel:   No results found for this basename: INR, PROTIME    RADIOLOGY: Dg Chest 2 View  07/17/2013   CLINICAL DATA:  Followup pneumonia and effusions  EXAM: CHEST  2 VIEW  COMPARISON:  Chest x-ray of 07/16/2013  FINDINGS: Aeration of the lungs has improved slightly and there has been some improvement in the degree of pulmonary vascular congestion. Small effusions remain, with possible loculation at the lung base on the lateral view posteriorly. Cardiomegaly is stable.  IMPRESSION: Slightly improved aeration with some improvement in pulmonary vascular congestion. Small effusions remain.   Electronically Signed   By: Ivar Drape M.D.   On: 07/17/2013 08:25   Dg Chest 2 View  07/16/2013   CLINICAL DATA:  Short of breath today.  Bronchoscopy yesterday.  EXAM: CHEST  2 VIEW  COMPARISON:  02/06/2012  FINDINGS: No pneumothorax.  Postsurgical changes are noted with multiple pericarinal vascular clips. This is  stable.  Coarse reticular opacities are noted in the bases most likely atelectasis and/or scarring. There is blunting of the lateral costophrenic sulcus on the right with some fluid extending into the lower right oblique fissure. This is new. Blunting of the lateral left costophrenic sulcus is stable consistent with chronic pleural thickening. No convincing pulmonary edema.  Cardiac silhouette is normal in size. No mediastinal or hilar masses. No evidence of adenopathy.  Bony thorax is demineralized but grossly intact.  IMPRESSION: 1. No pneumothorax or evidence of an complication following bronchoscopy. 2. Small amount of right basilar fluid which tracks along the inferior right oblique fissure. 3. Lung base opacity bilaterally, greater on the right. This may be atelectasis, scarring or a  combination. Right lung base infiltrate should be considered if there are symptoms consistent with pneumonia. No convincing pulmonary edema.   Electronically Signed   By: Lajean Manes M.D.   On: 07/16/2013 10:40   US Renal  07/18/2013   CLINICAL DATA:  Acute on chronic renal failure.  EXAM: RENAL/URINARY TRACT ULTRASOUND COMPLETE  COMPARISON:  None.  FINDINGS: Right Kidney:  Length: 9.6 cm. Echogenicity within normal limits. No mass or hydronephrosis visualized.  Left Kidney:  Length: 9.3 cm. Echogenicity within normal limits. No mass or hydronephrosis visualized.  Bladder:  Appears normal for degree of bladder distention.  IMPRESSION: Unremarkable appearance of the kidneys.  No hydronephrosis.   Electronically Signed   By: Logan Bores   On: 07/18/2013 14:52      ASSESSMENT:/PLAN:   Aortic stenosis: Moderate to severe.  No sx today with walking.  She could be a candidate for TAVR given recent CHF.   CKD: Cr improving.  Acute diastolic heart failure: Of note, no CAD by cath at [facility] 3 years ago pre lung transplant, per patient.  OK for oral diuretics now.  Low dose nitrates, limited by hypotension.   OK to d/c. F/u with Dr. Ron Parker.  Jettie Booze., MD  07/21/2013  12:14 PM

## 2013-07-21 NOTE — Progress Notes (Signed)
BP 102/46 Pulse 63 MD updated Maintain current plan of care

## 2013-07-21 NOTE — Discharge Summary (Signed)
Physician Discharge Summary  Nancy Blair HKV:[id number] DOB: 1935/05/31 DOA: 07/16/2013  PCP: Annye Asa, MD  Admit date: 07/16/2013 Discharge date: 07/21/2013  Time spent: 40 minutes  Recommendations for Outpatient Follow-up:  1. Patient instructed to get daily weights everyday and increase dose of Lasix to 2 tablets if increase in 2 pounds over 24. 2. Close followup needed with cardiology as an outpatient to determine need for aortic valve repair-cc Dr. Ron Parker to Arrange Followup 3. Get basic metabolic panel in about one week 4. Instructions low-salt diet 5. Needs close followup with pulmonology as an outpatient 6. Recommend coordination with nephrology as an as well  Discharge Diagnoses:  Principal Problem:   SOB (shortness of breath) Active Problems:   HYPOTHYROIDISM   HYPERTENSION   Lung transplant status, bilateral   Hypoxia   Acute respiratory failure with hypoxia   Aortic stenosis   CKD (chronic kidney disease) stage 4, GFR 15-29 ml/min   Acute diastolic CHF (congestive heart failure)   Chronic diastolic heart failure   Acute on chronic diastolic heart failure   Discharge Condition: Good  Diet recommendation: Low-salt 2 g  Filed Weights   07/19/13 0530 07/20/13 0602 07/21/13 0532  Weight: 60 kg (132 lb 4.4 oz) 60.283 kg (132 lb 14.4 oz) 61.508 kg (135 lb 9.6 oz)     Hospital Course:  1. Acute hypoxic respiratory failure secondary to Decompensated acute new onset diastolic dysfunction in the setting of severe aortic stenosis-appreciate consultant input-nephrology has changed Nancy Blair Lasix to 20 mg IV daily which has been held 4/3. Continue Coreg 12.5 twice a day, -4 L out so far today-Lasix resumed at prior to admission dose of 20 mg q. other day and close followup recommended as an outpatient for patient as may need cardiac cath 2. Severe aortic stenosis-cardiology consulted. Potentially may benefit from outpatient cardiac catheterization/TVR-defer to cardiology as  an outpatient  3. S/p BOLT 2012-continue antirejection medications including cyclosporine 75 twice a day, hydrocortisone twice a day, CellCept 500 twice a day, valganciclovir 450 every other day , Bactrim 1 tablet Monday Wednesday Friday.  4. Stable COPD 5. Grade 5 chronic kidney disease with acute injury component secondary to IV Lasix-appreciate Nephrology note-lasix held 4/5 as worsening Kidney function. Await cyclosporine level 9 AM 07/19/13-patient has very real risk of needing dialysis and is amenable to the same if needed-Prograf level ordered was low at 99 so this is unlikely to contribute to patient's renal insufficiency 6. Hypertension complicated by hypotension-amlodipine replaced with nifedipine for nephrology--and had to be adjusted downwards eventually to half tablet, 15 mg daily given some mild hypotension 7. Leukocytosis-likely secondary to chronic steroid therapy 8. Likely anemia of chronic disease vs. macrocytic anemia-monitor-continue iron 325 daily 9. History syncope orthostasis-stable at present 10. History of GERD with Nissen fundoplication-monitor 11. History hypothyroidism continue levothyroxine 88 mcg 12. Possible chronic adrenal insufficiency-continue hydrocortisone 20 twice a day 13. Hyperlipidemia continue statin 20 mg each bedtime 14. depression continue citalopram 20 each bedtime  Brief narrative:  78 y/o ?, former smoker, COPD s/p bilateral orthotopic Lung transplant 06/06/10-last PFT's 07/15/13=Mild Obst defect , GERD s/p Nissen, previous CMV viremia [facility] MOvement disorder, Orthostatic hypotension, CKD stage 3-4 whow as admitted 07/15/13 with SOB.  Found to have an elevated BNP 26,763, basilar fluid and lung base opacity on the right  Pulmonology saw the patient and recommended diuresis an echocardiogram as he seemed more congestive heart failure >pneumonia  Ultimately echocardiogram showed severe aortic stenosis cardiology was consulted to help with  management in the  setting of renal insufficiency  Nephrology was also consulted because of Nancy Blair renal insufficiency which subsequently resolved as above  Past medical history-As per Problem list  Chart reviewed as below-  Reviewed   Consultants:  Pulmonology Procedures:  Chest x-rays  Echocardiogram 07/16/13 = grade 1 diastolic dysfunction, moderate to severe aortic valve stenosis valve area 0.86 cm , mean gradient 36 mm mercury  Antibiotics:  Levofloxacin 4/1 then stopped  Valgancyclovir-chronic  Bactrim-chronic  Discharge Exam: Filed Vitals:   07/21/13 1100  BP: 102/46  Pulse: 63  Temp: 98.6 F (37 C)  Resp: 18   alert pleasant oriented doing very well General: EOMI, NCAT Cardiovascular: S1-S2 no murmur rub or gallop Respiratory: Clinically clear  Discharge Instructions You were cared for by a hospitalist during your hospital stay. If you have any questions about your discharge medications or the care you received while you were in the hospital after you are discharged, you can call the unit and asked to speak with the hospitalist on call if the hospitalist that took care of you is not available. Once you are discharged, your primary care physician will handle any further medical issues. Please note that NO REFILLS for any discharge medications will be authorized once you are discharged, as it is imperative that you return to your primary care physician (or establish a relationship with a primary care physician if you do not have one) for your aftercare needs so that they can reassess your need for medications and monitor your lab values.  Discharge Orders   Future Orders Complete By Expires   Diet - low sodium heart healthy  As directed    Discharge instructions  As directed    Comments:     Continue lasix 20 mg every other day.  If you've been more than 2 pounds over a 24-hour period of time he will need to take an extra dose that day Please followup with cardiology in about 2-3 to discuss  need for valve repair Followup with [facility] pulmonology and let them know that you've been here they can access Nancy Blair records through the same system Decreasing the amount of salt that you eat in your diet that is very important Measurable E. every day with the same scale   Increase activity slowly  As directed        Medication List         aspirin 81 MG chewable tablet  Chew 81 mg by mouth daily.     CALCIUM 600 + D PO  Take 2 tablets by mouth 2 (two) times daily.     carvedilol 6.25 MG tablet  Commonly known as:  COREG  Take 1 tablet (6.25 mg total) by mouth 2 (two) times daily with a meal.     citalopram 20 MG tablet  Commonly known as:  CELEXA  Take 20 mg by mouth at bedtime.     cycloSPORINE modified 25 MG capsule  Commonly known as:  NEORAL  Take 75 mg by mouth 2 (two) times daily. 75mg  in the morning and 50mg  in the evening     ferrous sulfate 325 (65 FE) MG tablet  Take 325 mg by mouth daily with breakfast.     furosemide 20 MG tablet  Commonly known as:  LASIX  Take 20 mg by mouth every other day. Takes on Monday, Wednesday, and Friday     hydrocortisone 20 MG tablet  Commonly known as:  CORTEF  Take by mouth 2 (  two) times daily.     insulin regular 100 units/mL injection  Commonly known as:  NOVOLIN R,HUMULIN R  Inject 1-3 Units into the skin at bedtime as needed for high blood sugar. On sliding scale     isosorbide mononitrate 30 MG 24 hr tablet  Commonly known as:  IMDUR  Take 0.5 tablets (15 mg total) by mouth daily.     levothyroxine 88 MCG tablet  Commonly known as:  SYNTHROID, LEVOTHROID  Take 88 mcg by mouth daily before breakfast.     magnesium oxide 400 MG tablet  Commonly known as:  MAG-OX  Take 400 mg by mouth daily.     multivitamin with minerals Tabs tablet  Take 1 tablet by mouth daily.     mycophenolate 500 MG tablet  Commonly known as:  CELLCEPT  Take 500 mg by mouth 2 (two) times daily.     pravastatin 20 MG tablet  Commonly known  as:  PRAVACHOL  Take 20 mg by mouth at bedtime.     sulfamethoxazole-trimethoprim 400-80 MG per tablet  Commonly known as:  BACTRIM,SEPTRA  Take 1 tablet by mouth every other day. Takes on Monday, Wednesday, and Friday     valGANciclovir 450 MG tablet  Commonly known as:  VALCYTE  Take 450 mg by mouth every other day. Monday , Wednesday and Friday     Vitamin D-3 1000 UNITS Caps  Take 1 capsule by mouth daily.       Allergies  Allergen Reactions  . Grapefruit Extract Anaphylaxis  . Oysters [Shellfish Allergy] Anaphylaxis  . Pineapple Anaphylaxis  . Bupropion Other (See Comments)    sleepy  . Neomycin-Bacitracin Zn-Polymyx Hives and Other (See Comments)    blisters  . Penicillins Swelling    Just face sweeling  . Prednisone Other (See Comments)    crazy  . Septra [Sulfamethoxazole-Tmp Ds]     unknown      The results of significant diagnostics from this hospitalization (including imaging, microbiology, ancillary and laboratory) are listed below for reference.    Significant Diagnostic Studies: Dg Chest 2 View  07/17/2013   CLINICAL DATA:  Followup pneumonia and effusions  EXAM: CHEST  2 VIEW  COMPARISON:  Chest x-ray of 07/16/2013  FINDINGS: Aeration of the lungs has improved slightly and there has been some improvement in the degree of pulmonary vascular congestion. Small effusions remain, with possible loculation at the lung base on the lateral view posteriorly. Cardiomegaly is stable.  IMPRESSION: Slightly improved aeration with some improvement in pulmonary vascular congestion. Small effusions remain.   Electronically Signed   By: Ivar Drape M.D.   On: 07/17/2013 08:25   Dg Chest 2 View  07/16/2013   CLINICAL DATA:  Short of breath today.  Bronchoscopy yesterday.  EXAM: CHEST  2 VIEW  COMPARISON:  02/06/2012  FINDINGS: No pneumothorax.  Postsurgical changes are noted with multiple pericarinal vascular clips. This is stable.  Coarse reticular opacities are noted in the  bases most likely atelectasis and/or scarring. There is blunting of the lateral costophrenic sulcus on the right with some fluid extending into the lower right oblique fissure. This is new. Blunting of the lateral left costophrenic sulcus is stable consistent with chronic pleural thickening. No convincing pulmonary edema.  Cardiac silhouette is normal in size. No mediastinal or hilar masses. No evidence of adenopathy.  Bony thorax is demineralized but grossly intact.  IMPRESSION: 1. No pneumothorax or evidence of an complication following bronchoscopy. 2. Small amount of right  basilar fluid which tracks along the inferior right oblique fissure. 3. Lung base opacity bilaterally, greater on the right. This may be atelectasis, scarring or a combination. Right lung base infiltrate should be considered if there are symptoms consistent with pneumonia. No convincing pulmonary edema.   Electronically Signed   By: Lajean Manes M.D.   On: 07/16/2013 10:40   US Renal  07/18/2013   CLINICAL DATA:  Acute on chronic renal failure.  EXAM: RENAL/URINARY TRACT ULTRASOUND COMPLETE  COMPARISON:  None.  FINDINGS: Right Kidney:  Length: 9.6 cm. Echogenicity within normal limits. No mass or hydronephrosis visualized.  Left Kidney:  Length: 9.3 cm. Echogenicity within normal limits. No mass or hydronephrosis visualized.  Bladder:  Appears normal for degree of bladder distention.  IMPRESSION: Unremarkable appearance of the kidneys.  No hydronephrosis.   Electronically Signed   By: Logan Bores   On: 07/18/2013 14:52    Microbiology: Recent Results (from the past 240 hour(s))  CULTURE, BLOOD (ROUTINE X 2)     Status: None   Collection Time    07/16/13  4:00 PM      Result Value Ref Range Status   Specimen Description BLOOD RIGHT HAND   Final   Special Requests BOTTLES DRAWN AEROBIC AND ANAEROBIC 10CC   Final   Culture  Setup Time     Final   Value: 07/16/2013 20:45     Performed at [facility]   Culture     Final    Value:        BLOOD CULTURE RECEIVED NO GROWTH TO DATE CULTURE WILL BE HELD FOR 5 DAYS BEFORE ISSUING A FINAL NEGATIVE REPORT     Performed at [facility]   Report Status PENDING   Incomplete  CULTURE, BLOOD (ROUTINE X 2)     Status: None   Collection Time    07/16/13  4:11 PM      Result Value Ref Range Status   Specimen Description BLOOD RIGHT ARM   Final   Special Requests BOTTLES DRAWN AEROBIC AND ANAEROBIC 10CC   Final   Culture  Setup Time     Final   Value: 07/16/2013 20:45     Performed at [facility]   Culture     Final   Value:        BLOOD CULTURE RECEIVED NO GROWTH TO DATE CULTURE WILL BE HELD FOR 5 DAYS BEFORE ISSUING A FINAL NEGATIVE REPORT     Performed at [facility]   Report Status PENDING   Incomplete     Labs: Basic Metabolic Panel:  Recent Labs Lab 07/16/13 2115 07/17/13 0335 07/18/13 0407 07/19/13 0500 07/19/13 0506 07/20/13 0417 07/21/13 0403  NA 138 139 138 141  --  140 138  K 5.1 5.3 4.7 4.3  --  4.4 4.1  CL 100 101 100 104  --  103 105  CO2 24 23 22 21   --  24 20  GLUCOSE 133* 183* 120* 108*  --  121* 130*  BUN 65* 67* 74* 81*  --  81* 76*  CREATININE 3.26* 3.18* 3.41* 3.73*  --  3.62* 2.92*  CALCIUM 9.4 9.4 9.1 9.1  --  9.0 8.5  MG 2.2  --   --   --  2.8* 2.6* 2.6*  PHOS  --   --   --  5.6*  --  5.2* 4.4   Liver Function Tests:  Recent Labs Lab 07/19/13 0500 07/20/13 0417 07/21/13 0403  ALBUMIN 3.2* 3.0* 2.8*   No results found for this basename: LIPASE, AMYLASE,  in the last 168 hours No results found for this basename: AMMONIA,  in the last 168 hours CBC:  Recent Labs Lab 07/16/13 1030 07/16/13 1610 07/17/13 0335 07/21/13 0403  WBC 11.3* 11.1* 12.4* 8.2  HGB 10.8* 10.5* 10.1* 9.7*  HCT 33.4* 32.7* 31.4* 30.5*  MCV 102.1* 103.2* 101.3* 101.7*  PLT 202 193 198 174   Cardiac Enzymes: No results found for this basename: CKTOTAL, CKMB, CKMBINDEX, TROPONINI,  in the last 168 hours BNP: BNP (last 3  results)  Recent Labs  07/16/13 1030  PROBNP 26763.0*   CBG:  Recent Labs Lab 07/20/13 0723 07/20/13 1148 07/20/13 1717 07/20/13 2117 07/21/13 1154  GLUCAP 118* 137* 173* 238* 139*       Signed:  Damyen Knoll, JAI-GURMUKH  [facility] 07/21/2013, 1:01 PM

## 2013-07-21 NOTE — Progress Notes (Signed)
Did not give isodil last night at hs as bp was 92/47.  Bp this am is elevated 163/60 so i am giving her her PRN apresoline as ordered.  Will continue to monitor

## 2013-07-22 ENCOUNTER — Other Ambulatory Visit: Payer: Self-pay | Admitting: Family Medicine

## 2013-07-22 ENCOUNTER — Telehealth: Payer: Self-pay | Admitting: Family Medicine

## 2013-07-22 LAB — GLUCOSE, CAPILLARY: GLUCOSE-CAPILLARY: 112 mg/dL — AB (ref 70–99)

## 2013-07-22 LAB — CULTURE, BLOOD (ROUTINE X 2)
Culture: NO GROWTH
Culture: NO GROWTH

## 2013-07-22 NOTE — Telephone Encounter (Signed)
Patient's son called to make a hospital follow up for his mother. Patient was seen last week on 07/16/2013 at [facility] for shortness of breath and discharged today. Please advise.

## 2013-07-22 NOTE — Telephone Encounter (Signed)
Spoke with patient's husband, Mr. Larose Kells, regarding his wife's recent hospitalization.  Patient was admitted on 07/16/13 at [facility] for Bronchospasms and shortness of breath.  She was discharged today and was instructed to follow up with her PCP.  According to the husband, patient is currently feeling much better.  No shortness of breath or signs of distress.  Mr. Schoppe knows to seek immediate attention if patient's symptoms return.     Medication and allergies:  Husband stated that he would bring in an updated list during the appointment.  He did not want to go over medications during the visit. New medications:  Isosorbide mononitrate 15 mg (1/2 tab) daily.  Medication was discussed with husband. He stated understanding, no questions.    Patient is still independent and living at home.     Appt scheduled:  07/24/13 @ 1130am.

## 2013-07-22 NOTE — Telephone Encounter (Signed)
Patient's husband called stating that she is out of her citalopram (CELEXA) 20 MG tablet for 4 days now and really needs a refill. Please advise

## 2013-07-23 NOTE — Telephone Encounter (Signed)
Denied, pt still in hospital.

## 2013-07-24 ENCOUNTER — Ambulatory Visit (INDEPENDENT_AMBULATORY_CARE_PROVIDER_SITE_OTHER): Payer: Medicare Other | Admitting: Family Medicine

## 2013-07-24 ENCOUNTER — Encounter: Payer: Self-pay | Admitting: Family Medicine

## 2013-07-24 VITALS — BP 150/84 | HR 72 | Temp 98.2°F | Resp 16 | Wt 137.1 lb

## 2013-07-24 DIAGNOSIS — N184 Chronic kidney disease, stage 4 (severe): Secondary | ICD-10-CM

## 2013-07-24 DIAGNOSIS — I10 Essential (primary) hypertension: Secondary | ICD-10-CM

## 2013-07-24 DIAGNOSIS — I5031 Acute diastolic (congestive) heart failure: Secondary | ICD-10-CM

## 2013-07-24 DIAGNOSIS — I350 Nonrheumatic aortic (valve) stenosis: Secondary | ICD-10-CM

## 2013-07-24 LAB — BASIC METABOLIC PANEL
BUN: 55 mg/dL — ABNORMAL HIGH (ref 6–23)
CHLORIDE: 102 meq/L (ref 96–112)
CO2: 23 mEq/L (ref 19–32)
Calcium: 10 mg/dL (ref 8.4–10.5)
Creatinine, Ser: 3 mg/dL — ABNORMAL HIGH (ref 0.4–1.2)
GFR: 16.46 mL/min — ABNORMAL LOW (ref >60.00)
Glucose, Bld: 112 mg/dL — ABNORMAL HIGH (ref 70–99)
POTASSIUM: 4.5 meq/L (ref 3.5–5.1)
Sodium: 137 mEq/L (ref 135–145)

## 2013-07-24 MED ORDER — NIFEDIPINE ER OSMOTIC RELEASE 30 MG PO TB24: 30.0000 mg | ORAL_TABLET | Freq: Every day | ORAL | Status: DC

## 2013-07-24 NOTE — Patient Instructions (Signed)
Follow up in 4-6 weeks to recheck BP Start the Nifedipine daily- if you have any dizziness, STOP this medication No other med changes at this time [facility] call you with your Kidney appt Call with any questions or concerns Hang in there!!!  You look great!

## 2013-07-24 NOTE — Progress Notes (Signed)
   Subjective:    Patient ID: Nancy Blair, female    DOB: 07-Mar-1936, 78 y.o.   MRN: [id number]  HPI Hospital F/U- pt admitted 4/1 and discharged 4/6 after acute respiratory failure due to acute CHF in setting of severe AS.  Has appt upcoming w/ Dr Ron Parker.  Currently on Coreg BID, lasix every other day.  Pt has hx of BOLT, following at [facility].  Pt reports breathing is good.  Pt has hx of HTN complicated by orthostatic hypotension.  D/C summary indicates that pt was to have her amlodipine replaced by Nifedipine 15mg  (1/2 tab) but this is not reflected on D/C summary.  BP is mildly high today.  Hospital team is recommending that pt re-establish w/ Dr Marval Regal.  No CP, SOB, HAs, edema, dizziness, N/V.   Review of Systems For ROS see HPI     Objective:   Physical Exam  Vitals reviewed. Constitutional: She is oriented to person, place, and time. She appears well-developed and well-nourished. No distress.  HENT:  Head: Normocephalic and atraumatic.  Cardiovascular: Normal rate and regular rhythm.   II/VI SEM heard best at RUSB  Pulmonary/Chest: Effort normal and breath sounds normal. No respiratory distress. She has no wheezes. She has no rales.  Musculoskeletal: She exhibits no edema.  Neurological: She is alert and oriented to person, place, and time.  Skin: Skin is warm and dry. No rash noted. No erythema.  Psychiatric: She has a normal mood and affect. Her behavior is normal. Thought content normal.          Assessment & Plan:

## 2013-07-24 NOTE — Progress Notes (Signed)
Pre visit review using our clinic review tool, if applicable. No additional management support is needed unless otherwise documented below in the visit note. 

## 2013-07-25 ENCOUNTER — Encounter: Payer: Self-pay | Admitting: General Practice

## 2013-07-25 ENCOUNTER — Telehealth: Payer: Self-pay | Admitting: Family Medicine

## 2013-07-25 NOTE — Telephone Encounter (Signed)
Relevant patient education mailed to patient.  

## 2013-07-27 NOTE — Assessment & Plan Note (Signed)
Pt has seen [facility] previously but has not been following regularly.  Due to worsening Cr will check BMP and re-refer.  Pt expressed understanding and is in agreement w/ plan.

## 2013-07-27 NOTE — Assessment & Plan Note (Signed)
New to provider, cause of recent hospitalization.  Has appt upcoming w/ cardiology.  Currently no signs of fluid overload.  Check BMP.  Will follow.

## 2013-07-27 NOTE — Assessment & Plan Note (Signed)
New to provider, ongoing for pt.  Pt has progressed from slight in 2011 to severe in 2015.  Has f/u w/ cards planned to determine whether she is a candidate for valve replacement.  Currently asymptomatic.  Will follow closely.

## 2013-07-27 NOTE — Assessment & Plan Note (Signed)
Chronic problem.  According to d/c summary pt is to be on Procardia but this was not prescribed at time of d/c.  Will start.  Pt to stop med if she has any dizziness (has hx of orthostatic hypotension).  Pt expressed understanding and is in agreement w/ plan.

## 2013-07-28 ENCOUNTER — Telehealth: Payer: Self-pay | Admitting: Family Medicine

## 2013-07-28 NOTE — Telephone Encounter (Signed)
Spoke with pt spouse who advised she was sleeping. Lm to return call.

## 2013-07-28 NOTE — Telephone Encounter (Signed)
Pt notified and appt made

## 2013-07-28 NOTE — Telephone Encounter (Signed)
07/28/13  Blood pressure medicine has made pt dizzy.  Pt has stopped taking medication due to dizziness.  Please advise.

## 2013-07-28 NOTE — Telephone Encounter (Signed)
Patient was recently started on Procardia XL 30 mg (1 tab) PO daily.  She was advised to stop medication for signs of dizziness.  Patient has a history of orthostatic hypotension.  She has stopped taking the medication.   Please advise.

## 2013-07-28 NOTE — Telephone Encounter (Signed)
Agree w/ stopping meds.  Will need f/u appt in 1-2 weeks to recheck BP

## 2013-08-11 ENCOUNTER — Encounter: Payer: Self-pay | Admitting: Cardiology

## 2013-08-11 DIAGNOSIS — K219 Gastro-esophageal reflux disease without esophagitis: Secondary | ICD-10-CM | POA: Insufficient documentation

## 2013-08-11 DIAGNOSIS — R9430 Abnormal result of cardiovascular function study, unspecified: Secondary | ICD-10-CM | POA: Insufficient documentation

## 2013-08-11 DIAGNOSIS — N184 Chronic kidney disease, stage 4 (severe): Secondary | ICD-10-CM | POA: Insufficient documentation

## 2013-08-12 ENCOUNTER — Encounter: Payer: Self-pay | Admitting: Cardiology

## 2013-08-12 ENCOUNTER — Ambulatory Visit (INDEPENDENT_AMBULATORY_CARE_PROVIDER_SITE_OTHER): Payer: Medicare Other | Admitting: Cardiology

## 2013-08-12 VITALS — BP 132/78 | HR 64 | Ht 60.0 in | Wt 138.4 lb

## 2013-08-12 DIAGNOSIS — I10 Essential (primary) hypertension: Secondary | ICD-10-CM

## 2013-08-12 DIAGNOSIS — I5032 Chronic diastolic (congestive) heart failure: Secondary | ICD-10-CM

## 2013-08-12 DIAGNOSIS — I359 Nonrheumatic aortic valve disorder, unspecified: Secondary | ICD-10-CM

## 2013-08-12 DIAGNOSIS — E039 Hypothyroidism, unspecified: Secondary | ICD-10-CM

## 2013-08-12 DIAGNOSIS — Z942 Lung transplant status: Secondary | ICD-10-CM

## 2013-08-12 DIAGNOSIS — T50995A Adverse effect of other drugs, medicaments and biological substances, initial encounter: Secondary | ICD-10-CM

## 2013-08-12 DIAGNOSIS — N185 Chronic kidney disease, stage 5: Secondary | ICD-10-CM

## 2013-08-12 DIAGNOSIS — I350 Nonrheumatic aortic (valve) stenosis: Secondary | ICD-10-CM

## 2013-08-12 LAB — BASIC METABOLIC PANEL
BUN: 66 mg/dL — ABNORMAL HIGH (ref 6–23)
CO2: 26 meq/L (ref 19–32)
Calcium: 9.1 mg/dL (ref 8.4–10.5)
Chloride: 105 mEq/L (ref 96–112)
Creatinine, Ser: 2.6 mg/dL — ABNORMAL HIGH (ref 0.4–1.2)
GFR: 19.04 mL/min — AB (ref >60.00)
GLUCOSE: 109 mg/dL — AB (ref 70–99)
Potassium: 4.1 mEq/L (ref 3.5–5.1)
SODIUM: 139 meq/L (ref 135–145)

## 2013-08-12 NOTE — Progress Notes (Signed)
Patient ID: Nancy Blair, female   DOB: 07-07-1935, 78 y.o.   MRN: [id number]    HPI  This very nice lady is seen for cardiology followup after recent hospitalization. Her medical problems are complex. I saw her in consultation when she was recently hospitalized with shortness of breath. There were multiple issues at that time. She has significant renal insufficiency. Her renal function may have been affected by some of her antirejection medicines that she takes for her lung transplant. She was significantly hypertensive when she came in. She also has moderately severe aortic stenosis. I believe that all of these factors played a role in her developing some volume overload and respiratory insufficiency. She was seen by pulmonary and nephrology and cardiology. She improved and was discharged. She is now back for followup. She has not yet seen the nephrology team post hospital. I'm hoping that she will arrange this through Dr. [name], her primary physician, who she will be seeing tomorrow. She is feeling great. She has not had significant return of shortness of breath her volume overload. We will send chemistry lab today to be available for Dr. [name] tomorrow.  Her overall status is complex with her history of lung transplants, anti-rejection medications, severe renal insufficiency, hypertension, and aortic stenosis.  Allergies  Allergen Reactions  . Grapefruit Extract Anaphylaxis  . Oysters [Shellfish Allergy] Anaphylaxis  . Pineapple Anaphylaxis  . Bupropion Other (See Comments)    sleepy  . Neomycin-Bacitracin Zn-Polymyx Hives and Other (See Comments)    blisters  . Penicillins Swelling    Just face sweeling  . Prednisone Other (See Comments)    crazy  . Septra [Sulfamethoxazole-Tmp Ds]     unknown    Current Outpatient Prescriptions  Medication Sig Dispense Refill  . aspirin 81 MG chewable tablet Chew 81 mg by mouth daily.      . Calcium Carbonate-Vitamin D (CALCIUM 600 + D PO) Take  2 tablets by mouth 2 (two) times daily.       . carvedilol (COREG) 6.25 MG tablet Take 1 tablet (6.25 mg total) by mouth 2 (two) times daily with a meal.  60 tablet  1  . Cholecalciferol (VITAMIN D-3) 1000 UNITS CAPS Take 1 capsule by mouth daily.      . citalopram (CELEXA) 20 MG tablet Take 20 mg by mouth at bedtime.      . cycloSPORINE modified (NEORAL) 25 MG capsule Take 75 mg by mouth 2 (two) times daily. 75mg  in the morning and 50mg  in the evening      . ferrous sulfate 325 (65 FE) MG tablet Take 325 mg by mouth daily with breakfast.      . furosemide (LASIX) 20 MG tablet Take 20 mg by mouth every other day. Takes on Monday, Wednesday, and Friday      . hydrocortisone (CORTEF) 20 MG tablet Take by mouth 2 (two) times daily.       . insulin regular (NOVOLIN R,HUMULIN R) 100 units/mL injection Inject 1-3 Units into the skin at bedtime as needed for high blood sugar. On sliding scale      . isosorbide mononitrate (IMDUR) 30 MG 24 hr tablet Take 0.5 tablets (15 mg total) by mouth daily.  30 tablet  0  . levothyroxine (SYNTHROID, LEVOTHROID) 88 MCG tablet Take 88 mcg by mouth daily before breakfast.      . magnesium oxide (MAG-OX) 400 MG tablet Take 400 mg by mouth daily.      . Multiple Vitamin (MULITIVITAMIN  WITH MINERALS) TABS Take 1 tablet by mouth daily.      . mycophenolate (CELLCEPT) 500 MG tablet Take 500 mg by mouth 2 (two) times daily.       Marland Kitchen NIFEdipine (PROCARDIA-XL/ADALAT-CC/NIFEDICAL-XL) 30 MG 24 hr tablet Take 1 tablet (30 mg total) by mouth daily.  30 tablet  3  . pravastatin (PRAVACHOL) 20 MG tablet Take 20 mg by mouth at bedtime.       . sulfamethoxazole-trimethoprim (BACTRIM,SEPTRA) 400-80 MG per tablet Take 1 tablet by mouth every other day. Takes on Monday, Wednesday, and Friday      . valGANciclovir (VALCYTE) 450 MG tablet Take 450 mg by mouth every other day. Monday , Wednesday and Friday       No current facility-administered medications for this visit.    History    Social History  . Marital Status: Married    Spouse Name: N/A    Number of Children: N/A  . Years of Education: N/A   Occupational History  . retired     Teacher, adult education   Social History Main Topics  . Smoking status: Former Smoker -- 3.00 packs/day    Start date: 04/17/1952    Quit date: 07/16/1993  . Smokeless tobacco: Never Used     Comment: quit 25 years ago  . Alcohol Use: No  . Drug Use: No  . Sexual Activity: No   Other Topics Concern  . Not on file   Social History Narrative   Lives with husband   Is a DNAR   Daughter who is an Rn [phone]    Family History  Problem Relation Age of Onset  . Heart disease Mother   . Heart disease Father   . Pancreatic cancer Sister   . Colon cancer Neg Hx   . Malignant hyperthermia Neg Hx   . Hypertension Mother   . Hypertension Father     Past Medical History  Diagnosis Date  . Emphysema of lung   . Hypertension   . Hyperlipidemia   . Allergy   . Anemia   . GERD (gastroesophageal reflux disease)   . Adrenal insufficiency   . Squamous acanthoma of skin 06/2011    rt leg  . Hypothyroidism   . CKD (chronic kidney disease) stage 4, GFR 15-29 ml/min     since 2012 (per [facility] records)  . Lung transplant status, bilateral   . Acute on chronic diastolic heart failure   . Ejection fraction     Past Surgical History  Procedure Laterality Date  . Appendectomy    . Abdominal hysterectomy    . Tonsillectomy    . Total lung replacement  2012    b/l lung transplant  . Esophagogastroduodenoscopy  05/26/2011    Procedure: ESOPHAGOGASTRODUODENOSCOPY (EGD);  Surgeon: Owens Loffler, MD;  Location: [facility];  Service: Endoscopy;  Laterality: N/A;    Patient Active Problem List   Diagnosis Date Noted  . GERD (gastroesophageal reflux disease) 08/11/2013  . CKD (chronic kidney disease) stage 5, GFR less than 15 ml/min 08/11/2013  . Ejection fraction   . Chronic diastolic heart failure   . Aortic stenosis 07/17/2013  . SOB  (shortness of breath) 07/16/2013  . Hypoxia 07/16/2013  . Fatigue 07/19/2012  . Tremor 07/19/2012  . Orthostatic hypotension 02/07/2012  . Syncopal episodes 02/06/2012  . Hip pain 01/10/2012  . Dizziness 09/02/2011  . Squamous cell carcinoma of leg 07/13/2011  . Gastritis 05/26/2011  . Anemia 05/26/2011  . Infected wound 04/27/2011  .  Lung transplant status, bilateral 04/27/2011  . OSTEOPENIA 09/15/2009  . TOBACCO USE, QUIT 02/09/2009  . VITAMIN D DEFICIENCY 09/09/2008  . URINARY FREQUENCY 09/09/2008  . UNS ADVRS EFF OTH RX MEDICINAL&BIOLOGICAL SBSTNC 09/09/2008  . HYPOKALEMIA 07/22/2008  . LEG CRAMPS, NOCTURNAL 07/22/2008  . HYPOTHYROIDISM 06/30/2008  . ANXIETY DEPRESSION 06/30/2008  . HYPERLIPIDEMIA 02/08/2008  . HYPERTENSION 12/24/2006    ROS   Patient denies fever, chills, headache, sweats, rash, change in vision, change in hearing, chest pain, cough, nausea or vomiting, urinary symptoms. All other systems are reviewed and are negative.  PHYSICAL EXAM  The patient really looks quite good today. She is oriented to person time and place. She is here with family. Head is atraumatic. Conjunctiva and sclera are normal. There is no jugular venous distention. Lungs are clear. Respiratory effort is nonlabored. Cardiac exam reveals S1 and S2. There is a crescendo decrescendo systolic murmur consistent with aortic stenosis. The second heart sound is heard. The abdomen is soft. There is no peripheral edema. There are no musculoskeletal deformities. There are no skin rashes.   Filed Vitals:   08/12/13 1442  BP: 132/78  Pulse: 64  Height: 5' (1.524 m)  Weight: 138 lb 6.4 oz (62.778 kg)     ASSESSMENT & PLAN

## 2013-08-12 NOTE — Patient Instructions (Signed)
Your physician recommends that you continue on your current medications as directed. Please refer to the Current Medication list given to you today.  Your physician recommends that you return for lab work in: today  Your physician recommends that you schedule a follow-up appointment in: 10 weeks   

## 2013-08-12 NOTE — Assessment & Plan Note (Signed)
At this time her volume status is stable .

## 2013-08-12 NOTE — Assessment & Plan Note (Signed)
It is my understanding that some of the antirejection medicines may have been playing a role with some of her renal insufficiency with her recent hospitalization. I do not have a complete understanding of the effects of these medicines.

## 2013-08-12 NOTE — Assessment & Plan Note (Signed)
I am hopeful that the patient will have nephrology followup soon. As we monitor volume status this will be very important. Her aortic valve certainly plays a role. However she does not have an indication for an aortic valve intervention yet.

## 2013-08-12 NOTE — Assessment & Plan Note (Addendum)
The patient has moderately severe aortic stenosis. At this point I feel that there is no indication for any intervention to her aortic valve at this time. This will have to be carefully considered over time. I feel that her aortic stenosis probably he did play a role with some of her symptoms recently in the hospital. However there were multiple other factors including her renal function worsening and her hypertension. We need to have ongoing assessment by nephrology to get the best plan from them. They also need to watch her blood pressure very carefully. I will plan to see her back to follow the cardiology aspect of this.  As part of today's evaluation I spent greater than 25 minutes with a total care. More than half of this time was with direct contact with her reviewing all aspects of her medical care.

## 2013-08-12 NOTE — Assessment & Plan Note (Signed)
The patient's thyroid status is treated. No change in therapy.

## 2013-08-12 NOTE — Assessment & Plan Note (Signed)
The patient is status post lung transplant this. She is on multiple antirejection medications.

## 2013-08-12 NOTE — Assessment & Plan Note (Signed)
Blood pressure is under good control today. No change in therapy.

## 2013-08-13 ENCOUNTER — Encounter: Payer: Self-pay | Admitting: Family Medicine

## 2013-08-13 ENCOUNTER — Telehealth: Payer: Self-pay

## 2013-08-13 ENCOUNTER — Ambulatory Visit (INDEPENDENT_AMBULATORY_CARE_PROVIDER_SITE_OTHER): Payer: Medicare Other | Admitting: Family Medicine

## 2013-08-13 VITALS — BP 140/88 | HR 69 | Temp 98.2°F | Resp 16 | Wt 138.4 lb

## 2013-08-13 DIAGNOSIS — I10 Essential (primary) hypertension: Secondary | ICD-10-CM

## 2013-08-13 NOTE — Progress Notes (Signed)
Pre visit review using our clinic review tool, if applicable. No additional management support is needed unless otherwise documented below in the visit note. 

## 2013-08-13 NOTE — Progress Notes (Signed)
   Subjective:    Patient ID: Nancy Blair, female    DOB: 09-Oct-1935, 78 y.o.   MRN: [id number]  HPI HTN- chronic problem, pt had to stop Procardia due to dizziness and presumed hypotension.  BP yesterday at Cardiology was 132/78, today 140/88.  Dizziness has resolved since stopping Procardia.  No CP, SOB, HAs, edema.   Review of Systems For ROS see HPI     Objective:   Physical Exam  Vitals reviewed. Constitutional: She is oriented to person, place, and time. She appears well-developed and well-nourished. No distress.  HENT:  Head: Normocephalic and atraumatic.  Eyes: Conjunctivae and EOM are normal. Pupils are equal, round, and reactive to light.  Neck: Normal range of motion. Neck supple. No thyromegaly present.  Cardiovascular: Normal rate, regular rhythm and intact distal pulses.   Murmur (II/VI SEM heard best at upper sternal border) heard. Pulmonary/Chest: Effort normal and breath sounds normal. No respiratory distress.  Abdominal: Soft. She exhibits no distension. There is no tenderness.  Musculoskeletal: She exhibits no edema.  Lymphadenopathy:    She has no cervical adenopathy.  Neurological: She is alert and oriented to person, place, and time.  Skin: Skin is warm and dry.  Psychiatric: She has a normal mood and affect. Her behavior is normal.          Assessment & Plan:

## 2013-08-13 NOTE — Assessment & Plan Note (Signed)
Chronic problem.  Adequate control since stopping Procardia.  No med changes at this time.  Will continue to follow.

## 2013-08-13 NOTE — Telephone Encounter (Signed)
Mr Lennox called and stated that patient was taking 2 Zaleplon (Sonata) nightly.   Dr. [name] was made aware.  Her recommendation was no changes at this time.  The same was relayed back to Mr. Bonillas.  He stated "ok."  No further questions or concerns voiced.

## 2013-08-13 NOTE — Patient Instructions (Signed)
Schedule your complete physical in 3 months Call with any questions or concerns! Keep up the good work!  You look great!!

## 2013-08-19 DIAGNOSIS — I509 Heart failure, unspecified: Secondary | ICD-10-CM

## 2013-08-19 DIAGNOSIS — I5031 Acute diastolic (congestive) heart failure: Secondary | ICD-10-CM

## 2013-08-19 DIAGNOSIS — I359 Nonrheumatic aortic valve disorder, unspecified: Secondary | ICD-10-CM

## 2013-08-19 DIAGNOSIS — N184 Chronic kidney disease, stage 4 (severe): Secondary | ICD-10-CM

## 2013-08-26 ENCOUNTER — Other Ambulatory Visit: Payer: Self-pay | Admitting: General Practice

## 2013-08-26 MED ORDER — ISOSORBIDE MONONITRATE ER 30 MG PO TB24: 15.0000 mg | ORAL_TABLET | Freq: Every day | ORAL | Status: DC

## 2013-08-26 MED ORDER — PRAVASTATIN SODIUM 20 MG PO TABS: 20.0000 mg | ORAL_TABLET | Freq: Every day | ORAL | Status: DC

## 2013-09-05 ENCOUNTER — Encounter: Payer: Self-pay | Admitting: General Practice

## 2013-09-05 ENCOUNTER — Other Ambulatory Visit: Payer: Self-pay | Admitting: General Practice

## 2013-09-05 ENCOUNTER — Ambulatory Visit (INDEPENDENT_AMBULATORY_CARE_PROVIDER_SITE_OTHER): Payer: Medicare Other | Admitting: Family Medicine

## 2013-09-05 ENCOUNTER — Encounter: Payer: Self-pay | Admitting: Family Medicine

## 2013-09-05 VITALS — BP 130/80 | HR 77 | Temp 98.2°F | Resp 16 | Wt 137.0 lb

## 2013-09-05 DIAGNOSIS — I10 Essential (primary) hypertension: Secondary | ICD-10-CM

## 2013-09-05 DIAGNOSIS — Z1231 Encounter for screening mammogram for malignant neoplasm of breast: Secondary | ICD-10-CM

## 2013-09-05 DIAGNOSIS — E039 Hypothyroidism, unspecified: Secondary | ICD-10-CM

## 2013-09-05 DIAGNOSIS — E785 Hyperlipidemia, unspecified: Secondary | ICD-10-CM

## 2013-09-05 DIAGNOSIS — Z942 Lung transplant status: Secondary | ICD-10-CM

## 2013-09-05 DIAGNOSIS — F341 Dysthymic disorder: Secondary | ICD-10-CM

## 2013-09-05 DIAGNOSIS — E559 Vitamin D deficiency, unspecified: Secondary | ICD-10-CM

## 2013-09-05 LAB — CBC WITH DIFFERENTIAL/PLATELET
Basophils Absolute: 0 10*3/uL (ref 0.0–0.1)
Basophils Relative: 0.4 % (ref 0.0–3.0)
EOS ABS: 0 10*3/uL (ref 0.0–0.7)
EOS PCT: 0.1 % (ref 0.0–5.0)
HCT: 30.1 % — ABNORMAL LOW (ref 36.0–46.0)
Hemoglobin: 10 g/dL — ABNORMAL LOW (ref 12.0–15.0)
LYMPHS PCT: 10.5 % — AB (ref 12.0–46.0)
Lymphs Abs: 1.1 10*3/uL (ref 0.7–4.0)
MCHC: 33.3 g/dL (ref 30.0–36.0)
MCV: 100.7 fl — ABNORMAL HIGH (ref 78.0–100.0)
MONOS PCT: 6.3 % (ref 3.0–12.0)
Monocytes Absolute: 0.6 10*3/uL (ref 0.1–1.0)
NEUTROS ABS: 8.5 10*3/uL — AB (ref 1.4–7.7)
NEUTROS PCT: 82.7 % — AB (ref 43.0–77.0)
Platelets: 257 10*3/uL (ref 150.0–400.0)
RBC: 2.99 Mil/uL — ABNORMAL LOW (ref 3.87–5.11)
RDW: 14.6 % (ref 11.5–15.5)
WBC: 10.2 10*3/uL (ref 4.0–10.5)

## 2013-09-05 LAB — LIPID PANEL
CHOL/HDL RATIO: 2
Cholesterol: 171 mg/dL (ref 0–200)
HDL: 71 mg/dL (ref >39.00)
LDL Cholesterol: 43 mg/dL (ref 0–99)
Triglycerides: 286 mg/dL — ABNORMAL HIGH (ref 0.0–149.0)
VLDL: 57.2 mg/dL — AB (ref 0.0–40.0)

## 2013-09-05 LAB — HEPATIC FUNCTION PANEL
ALT: 16 U/L (ref 0–35)
AST: 19 U/L (ref 0–37)
Albumin: 3.2 g/dL — ABNORMAL LOW (ref 3.5–5.2)
Alkaline Phosphatase: 39 U/L (ref 39–117)
Bilirubin, Direct: 0 mg/dL (ref 0.0–0.3)
TOTAL PROTEIN: 5.9 g/dL — AB (ref 6.0–8.3)
Total Bilirubin: 0.7 mg/dL (ref 0.2–1.2)

## 2013-09-05 LAB — BASIC METABOLIC PANEL WITH GFR
BUN: 46 mg/dL — ABNORMAL HIGH (ref 6–23)
CO2: 25 meq/L (ref 19–32)
Calcium: 8.9 mg/dL (ref 8.4–10.5)
Chloride: 105 meq/L (ref 96–112)
Creatinine, Ser: 2.7 mg/dL — ABNORMAL HIGH (ref 0.4–1.2)
GFR: 18.62 mL/min — ABNORMAL LOW
Glucose, Bld: 110 mg/dL — ABNORMAL HIGH (ref 70–99)
Potassium: 3.1 meq/L — ABNORMAL LOW (ref 3.5–5.1)
Sodium: 140 meq/L (ref 135–145)

## 2013-09-05 LAB — TSH: TSH: 0.53 u[IU]/mL (ref 0.35–4.50)

## 2013-09-05 LAB — MAGNESIUM: Magnesium: 1.9 mg/dL (ref 1.5–2.5)

## 2013-09-05 MED ORDER — POTASSIUM CHLORIDE CRYS ER 20 MEQ PO TBCR: 20.0000 meq | EXTENDED_RELEASE_TABLET | Freq: Every day | ORAL | Status: DC

## 2013-09-05 NOTE — Assessment & Plan Note (Signed)
Chronic problem.  Tolerating statin w/o difficulty.  Check labs.  Adjust meds prn  

## 2013-09-05 NOTE — Progress Notes (Signed)
Pre visit review using our clinic review tool, if applicable. No additional management support is needed unless otherwise documented below in the visit note. 

## 2013-09-05 NOTE — Assessment & Plan Note (Signed)
Chronic problem.  Tolerating med w/o difficulty.  Asymptomatic.  Check labs.  Adjust meds prn

## 2013-09-05 NOTE — Assessment & Plan Note (Signed)
Pt due for labs.  Replete prn.

## 2013-09-05 NOTE — Patient Instructions (Signed)
Schedule your complete physical in 6 months We'll notify you of your lab results and make any changes if needed We'll call you with your mammogram appt Please call [place] and look into counseling Call with any questions or concerns You are in my thoughts and prayers!!!

## 2013-09-05 NOTE — Assessment & Plan Note (Signed)
Chronic problem, following w/ [facility].  Continues to have weekly labs drawn here and faxed to pulmonary team.  Currently asymptomatic.

## 2013-09-05 NOTE — Progress Notes (Signed)
   Subjective:    Patient ID: Nancy Blair, female    DOB: [birth year], 78 y.o.   MRN: [id number]  HPI Cards- [facility]  HTN- chronic problem, on Coreg, Lasix, Imdur.  No CP, SOB, HAs, visual changes, edema  Hyperlipidemia- chronic problem, on Pravastatin.  Denies abd pain, N/V, myalgias  Vit D deficiency- chronic problem, on OTC supplementation  Depression- pt's husband passed away suddenly and unexpectedly 2 weeks ago.  Pt doing surprisingly well.  Currently on Citalopram.  Health maintenance- due for mammo   Review of Systems For ROS see HPI     Objective:   Physical Exam  Constitutional: She is oriented to person, place, and time. She appears well-developed and well-nourished. No distress.  HENT:  Head: Normocephalic and atraumatic.  Eyes: Conjunctivae and EOM are normal. Pupils are equal, round, and reactive to light.  Neck: Normal range of motion. Neck supple. No thyromegaly present.  Cardiovascular: Normal rate, regular rhythm, normal heart sounds and intact distal pulses.   No murmur heard. Pulmonary/Chest: Effort normal and breath sounds normal. No respiratory distress.  Abdominal: Soft. She exhibits no distension. There is no tenderness.  Musculoskeletal: She exhibits no edema.  Lymphadenopathy:    She has no cervical adenopathy.  Neurological: She is alert and oriented to person, place, and time.  Skin: Skin is warm and dry.  Psychiatric: She has a normal mood and affect. Her behavior is normal.          Assessment & Plan:

## 2013-09-05 NOTE — Assessment & Plan Note (Signed)
Chronic problem.  Pt doing amazingly well considering husband passed away suddenly just 2 weeks ago.  No med changes at this time.  Encouraged pt to seek counseling through Hospice.

## 2013-09-05 NOTE — Assessment & Plan Note (Signed)
Chronic problem.  Well controlled today.  Asymptomatic.  Check labs.  No anticipated changes at this time.

## 2013-09-06 LAB — CYCLOSPORINE LEVEL: CYCLOSPORINE, BLOOD: 65 ng/mL — AB (ref 140–330)

## 2013-09-11 LAB — VITAMIN D 1,25 DIHYDROXY
VITAMIN D3 1, 25 (OH): 44 pg/mL
Vitamin D 1, 25 (OH)2 Total: 44 pg/mL (ref 18–72)

## 2013-09-12 ENCOUNTER — Encounter: Payer: Self-pay | Admitting: General Practice

## 2013-09-14 ENCOUNTER — Encounter: Payer: Self-pay | Admitting: Gastroenterology

## 2013-09-19 ENCOUNTER — Telehealth: Payer: Self-pay | Admitting: Family Medicine

## 2013-09-19 NOTE — Telephone Encounter (Signed)
Caller name:[name] Relation to pt: daughter Call back number: [phone] Pharmacy:  Reason for call: Patient daughter called to inform dr [name] she will take her mother on Tuesday to [facility] to have her labs drawn. Also if we could faxed the lab order to her. Fax number:[phone]

## 2013-09-19 NOTE — Telephone Encounter (Signed)
Letter written and faxed. Pt daughter notified.

## 2013-09-30 ENCOUNTER — Encounter: Payer: Self-pay | Admitting: Cardiology

## 2013-10-01 ENCOUNTER — Encounter: Payer: Self-pay | Admitting: Family Medicine

## 2013-10-09 ENCOUNTER — Ambulatory Visit: Payer: Medicare Other | Admitting: Cardiology

## 2013-10-09 ENCOUNTER — Telehealth: Payer: Self-pay | Admitting: *Deleted

## 2013-10-09 NOTE — Telephone Encounter (Signed)
Caller name:  [name] Relation to pt:  daughter Call back number:  [phone]   Pharmacy:  Reason for call:   Pt daughter called and made appt for labs ordered by [place].  Appt is 10/10/13 at 4pm.  Orders are not in chart.  If there is an issue, please call [name] at above number.

## 2013-10-09 NOTE — Telephone Encounter (Signed)
Called [name] back to verify what labs are needed.

## 2013-10-10 ENCOUNTER — Other Ambulatory Visit: Payer: Self-pay | Admitting: Family Medicine

## 2013-10-10 ENCOUNTER — Other Ambulatory Visit: Payer: Medicare Other

## 2013-10-10 ENCOUNTER — Encounter: Payer: Self-pay | Admitting: Cardiology

## 2013-10-10 DIAGNOSIS — Z942 Lung transplant status: Secondary | ICD-10-CM

## 2013-10-10 NOTE — Telephone Encounter (Signed)
Orders are in chart. They are standing to be released manually once a month.

## 2013-10-13 ENCOUNTER — Other Ambulatory Visit: Payer: Self-pay | Admitting: General Practice

## 2013-10-13 MED ORDER — ISOSORBIDE MONONITRATE ER 30 MG PO TB24: 15.0000 mg | ORAL_TABLET | Freq: Every day | ORAL | Status: DC

## 2013-10-23 ENCOUNTER — Ambulatory Visit: Payer: Medicare Other | Admitting: Cardiology

## 2013-10-23 ENCOUNTER — Telehealth: Payer: Self-pay | Admitting: Family Medicine

## 2013-10-23 MED ORDER — ISOSORBIDE MONONITRATE ER 30 MG PO TB24: 15.0000 mg | ORAL_TABLET | Freq: Every day | ORAL | Status: DC

## 2013-10-23 MED ORDER — FUROSEMIDE 20 MG PO TABS: 20.0000 mg | ORAL_TABLET | ORAL | Status: DC

## 2013-10-23 MED ORDER — ZALEPLON 5 MG PO CAPS: 5.0000 mg | ORAL_CAPSULE | Freq: Every day | ORAL | Status: DC

## 2013-10-23 NOTE — Telephone Encounter (Signed)
Med filled per tabori.

## 2013-10-23 NOTE — Telephone Encounter (Signed)
Caller name: [name] Daughter  Call back [place]: Kristopher Oppenheim [place] [phone]  Reason for call:  Pt is needing refills on :   isosorbide mononitrate (IMDUR) 30  furosemide (LASIX) 20 MG tablet  zaleplon (SONATA) 5 MG capsule    Pt is staying with Daughter in [place].  Pt is needing #90 on all RX

## 2013-12-01 ENCOUNTER — Telehealth: Payer: Self-pay

## 2013-12-01 NOTE — Telephone Encounter (Signed)
Home Health Certification and Plan of Care received via fax from [place].  Forms labeled and placed in Dr. Virgil Benedict red folder for review and signature.

## 2013-12-02 DIAGNOSIS — F3289 Other specified depressive episodes: Secondary | ICD-10-CM

## 2013-12-02 DIAGNOSIS — E139 Other specified diabetes mellitus without complications: Secondary | ICD-10-CM

## 2013-12-02 DIAGNOSIS — B49 Unspecified mycosis: Secondary | ICD-10-CM

## 2013-12-02 DIAGNOSIS — F329 Major depressive disorder, single episode, unspecified: Secondary | ICD-10-CM

## 2013-12-02 DIAGNOSIS — Z792 Long term (current) use of antibiotics: Secondary | ICD-10-CM

## 2013-12-02 DIAGNOSIS — I10 Essential (primary) hypertension: Secondary | ICD-10-CM

## 2013-12-02 DIAGNOSIS — T86819 Unspecified complication of lung transplant: Secondary | ICD-10-CM

## 2013-12-03 NOTE — Telephone Encounter (Signed)
Received signed [place] and POC form from Dr. [name].  All forms faxed to [place] at [phone]).  Confirmation received.//AB/CMA

## 2013-12-15 ENCOUNTER — Telehealth: Payer: Self-pay | Admitting: Family Medicine

## 2013-12-15 NOTE — Telephone Encounter (Signed)
Spoke with pt and advised that we would have to see her on Wednesday before we can assess if she needs wound care.

## 2013-12-15 NOTE — Telephone Encounter (Signed)
Patient is coming in on Wednesday, pts daughter is calling and states she fell(wound on knee) over the weekend. If pt needs wound care, they are requesting [facility].

## 2013-12-15 NOTE — Telephone Encounter (Signed)
Noted.  Will make determination on Wednesday as to what pt needs

## 2013-12-17 ENCOUNTER — Ambulatory Visit: Payer: Medicare Other | Admitting: Family Medicine

## 2013-12-17 DIAGNOSIS — Z0289 Encounter for other administrative examinations: Secondary | ICD-10-CM

## 2013-12-17 NOTE — Telephone Encounter (Signed)
No show for acute visit-pt fell

## 2013-12-17 NOTE — Telephone Encounter (Signed)
Please call pt and see why she did not make appt today

## 2013-12-17 NOTE — Telephone Encounter (Signed)
Called and lmovm to return call.

## 2014-02-02 ENCOUNTER — Encounter: Payer: Self-pay | Admitting: Nurse Practitioner

## 2014-02-02 ENCOUNTER — Encounter: Payer: Self-pay | Admitting: Family Medicine

## 2014-02-02 ENCOUNTER — Ambulatory Visit (INDEPENDENT_AMBULATORY_CARE_PROVIDER_SITE_OTHER): Payer: Medicare Other | Admitting: Nurse Practitioner

## 2014-02-02 ENCOUNTER — Other Ambulatory Visit: Payer: Self-pay | Admitting: *Deleted

## 2014-02-02 ENCOUNTER — Ambulatory Visit (INDEPENDENT_AMBULATORY_CARE_PROVIDER_SITE_OTHER): Payer: Medicare Other | Admitting: Family Medicine

## 2014-02-02 VITALS — BP 140/84 | HR 82 | Temp 98.2°F | Resp 16 | Wt 142.4 lb

## 2014-02-02 VITALS — BP 132/84 | HR 68 | Ht 60.0 in | Wt 142.4 lb

## 2014-02-02 DIAGNOSIS — E785 Hyperlipidemia, unspecified: Secondary | ICD-10-CM

## 2014-02-02 DIAGNOSIS — F32A Depression, unspecified: Secondary | ICD-10-CM

## 2014-02-02 DIAGNOSIS — E038 Other specified hypothyroidism: Secondary | ICD-10-CM

## 2014-02-02 DIAGNOSIS — I10 Essential (primary) hypertension: Secondary | ICD-10-CM

## 2014-02-02 DIAGNOSIS — F329 Major depressive disorder, single episode, unspecified: Secondary | ICD-10-CM

## 2014-02-02 DIAGNOSIS — Z23 Encounter for immunization: Secondary | ICD-10-CM

## 2014-02-02 DIAGNOSIS — I350 Nonrheumatic aortic (valve) stenosis: Secondary | ICD-10-CM

## 2014-02-02 LAB — CBC WITH DIFFERENTIAL/PLATELET
BASOS ABS: 0.1 10*3/uL (ref 0.0–0.1)
Basophils Relative: 0.4 % (ref 0.0–3.0)
Eosinophils Absolute: 0.1 10*3/uL (ref 0.0–0.7)
Eosinophils Relative: 0.4 % (ref 0.0–5.0)
HEMATOCRIT: 30.2 % — AB (ref 36.0–46.0)
Hemoglobin: 9.8 g/dL — ABNORMAL LOW (ref 12.0–15.0)
LYMPHS ABS: 1.5 10*3/uL (ref 0.7–4.0)
Lymphocytes Relative: 10.4 % — ABNORMAL LOW (ref 12.0–46.0)
MCHC: 32.5 g/dL (ref 30.0–36.0)
MCV: 96.9 fl (ref 78.0–100.0)
MONO ABS: 0.6 10*3/uL (ref 0.1–1.0)
MONOS PCT: 4.4 % (ref 3.0–12.0)
Neutro Abs: 11.8 10*3/uL — ABNORMAL HIGH (ref 1.4–7.7)
Neutrophils Relative %: 84.4 % — ABNORMAL HIGH (ref 43.0–77.0)
Platelets: 289 10*3/uL (ref 150.0–400.0)
RBC: 3.12 Mil/uL — ABNORMAL LOW (ref 3.87–5.11)
RDW: 15.3 % (ref 11.5–15.5)
WBC: 14 10*3/uL — ABNORMAL HIGH (ref 4.0–10.5)

## 2014-02-02 LAB — TSH: TSH: 2.59 u[IU]/mL (ref 0.35–4.50)

## 2014-02-02 LAB — HEPATIC FUNCTION PANEL
ALT: 15 U/L (ref 0–35)
AST: 19 U/L (ref 0–37)
Albumin: 2.9 g/dL — ABNORMAL LOW (ref 3.5–5.2)
Alkaline Phosphatase: 64 U/L (ref 39–117)
BILIRUBIN TOTAL: 0.7 mg/dL (ref 0.2–1.2)
Bilirubin, Direct: 0 mg/dL (ref 0.0–0.3)
TOTAL PROTEIN: 6.2 g/dL (ref 6.0–8.3)

## 2014-02-02 LAB — LIPID PANEL
CHOL/HDL RATIO: 4
CHOLESTEROL: 171 mg/dL (ref 0–200)
HDL: 45.8 mg/dL (ref >39.00)
NonHDL: 125.2
Triglycerides: 263 mg/dL — ABNORMAL HIGH (ref 0.0–149.0)
VLDL: 52.6 mg/dL — ABNORMAL HIGH (ref 0.0–40.0)

## 2014-02-02 LAB — BASIC METABOLIC PANEL
BUN: 46 mg/dL — AB (ref 6–23)
CHLORIDE: 106 meq/L (ref 96–112)
CO2: 24 meq/L (ref 19–32)
Calcium: 8.7 mg/dL (ref 8.4–10.5)
Creatinine, Ser: 2.8 mg/dL — ABNORMAL HIGH (ref 0.4–1.2)
GFR: 17.53 mL/min — ABNORMAL LOW (ref >60.00)
Glucose, Bld: 103 mg/dL — ABNORMAL HIGH (ref 70–99)
POTASSIUM: 4.4 meq/L (ref 3.5–5.1)
Sodium: 139 mEq/L (ref 135–145)

## 2014-02-02 LAB — LDL CHOLESTEROL, DIRECT: LDL DIRECT: 87.3 mg/dL

## 2014-02-02 MED ORDER — CITALOPRAM HYDROBROMIDE 40 MG PO TABS: 40.0000 mg | ORAL_TABLET | Freq: Every day | ORAL | Status: DC

## 2014-02-02 NOTE — Progress Notes (Signed)
Nancy Blair Date of Birth: Apr 01, 1936 Medical Record #[id number]  History of Present Illness: Nancy Blair is seen back today for a follow up visit. Seen for Dr. Ron Parker. She is a 78 year old female with multiple issues. 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 Followed by Dr. [name] at primary care. Not clear to me as whether she has CAD.   Was here back in April after an admission for volume overload. Saw Dr. Ron Parker for a post hospital visit - was to have come back in June.   Comes here today. Here with her daughters. She is doing well. No real problems. Breathing is stable. Has some degree of dyspnea - but stable. No chest pain. No palpitations. No syncope. Feels pretty good. Now living at [facility]. Daughter is managing her medicines. Has had recent followup at [facility] which has went well. Seeing Dr. Marval Regal later this week for her CKD. Making good urine. Seems to be stable from our standpoint. No swelling. Weight does fluctuate some and she is on lasix just QOD. Restricts salt.   Current Outpatient Prescriptions  Medication Sig Dispense Refill  . aspirin 81 MG chewable tablet Chew 81 mg by mouth daily.      . carvedilol (COREG) 6.25 MG tablet Take 1 tablet (6.25 mg total) by mouth 2 (two) times daily with a meal.  60 tablet  1  . citalopram (CELEXA) 20 MG tablet Take 20 mg by mouth at bedtime.      . cycloSPORINE modified (NEORAL) 25 MG capsule Take 75 mg by mouth 2 (two) times daily. 75mg  in the morning and 100 mg in the evening      . ferrous sulfate 325 (65 FE) MG tablet Take 325 mg by mouth daily with breakfast.      . furosemide (LASIX) 20 MG tablet Take 1 tablet (20 mg total) by mouth every other day. Takes on Monday, Wednesday, and Friday  90 tablet  0  . hydrocortisone (CORTEF) 20 MG tablet Take by mouth 2 (two) times daily.       . insulin regular (NOVOLIN R,HUMULIN R)  100 units/mL injection Inject 1-3 Units into the skin at bedtime as needed for high blood sugar. On sliding scale      . isosorbide mononitrate (IMDUR) 30 MG 24 hr tablet Take 0.5 tablets (15 mg total) by mouth daily.  90 tablet  0  . levothyroxine (SYNTHROID, LEVOTHROID) 88 MCG tablet Take 88 mcg by mouth daily before breakfast.      . Multiple Vitamin (MULITIVITAMIN WITH MINERALS) TABS Take 1 tablet by mouth daily.      . mycophenolate (CELLCEPT) 500 MG tablet Take 500 mg by mouth 2 (two) times daily.       . potassium chloride SA (K-DUR,KLOR-CON) 20 MEQ tablet Take 1 tablet (20 mEq total) by mouth daily.  30 tablet  6  . pravastatin (PRAVACHOL) 20 MG tablet Take 1 tablet (20 mg total) by mouth at bedtime.  30 tablet  6  . sulfamethoxazole-trimethoprim (BACTRIM,SEPTRA) 400-80 MG per tablet Take 1 tablet by mouth every other day. Takes on Monday, Wednesday, and Friday      . valGANciclovir (VALCYTE) 450 MG tablet Take 450 mg by mouth every other day. Monday , Wednesday and Friday       No current facility-administered medications for this visit.    Allergies  Allergen Reactions  . Grapefruit Extract  Anaphylaxis  . Oysters [Shellfish Allergy] Anaphylaxis  . Pineapple Anaphylaxis  . Bupropion Other (See Comments)    sleepy  . Neomycin-Bacitracin Zn-Polymyx Hives and Other (See Comments)    blisters  . Penicillins Swelling    Just face sweeling  . Prednisone Other (See Comments)    crazy  . Septra [Sulfamethoxazole-Tmp Ds]     unknown    Past Medical History  Diagnosis Date  . Emphysema of lung   . Hypertension   . Hyperlipidemia   . Allergy   . Anemia   . GERD (gastroesophageal reflux disease)   . Adrenal insufficiency   . Squamous acanthoma of skin 06/2011    rt leg  . Hypothyroidism   . CKD (chronic kidney disease) stage 4, GFR 15-29 ml/min     since 2012 (per [facility] records)  . Lung transplant status, bilateral   . Acute on chronic diastolic heart failure   . Ejection  fraction     Past Surgical History  Procedure Laterality Date  . Appendectomy    . Abdominal hysterectomy    . Tonsillectomy    . Total lung replacement  2012    b/l lung transplant  . Esophagogastroduodenoscopy  05/26/2011    Procedure: ESOPHAGOGASTRODUODENOSCOPY (EGD);  Surgeon: Owens Loffler, MD;  Location: [facility];  Service: Endoscopy;  Laterality: N/A;    History  Smoking status  . Former Smoker -- 3.00 packs/day  . Start date: 04/17/1952  . Quit date: 07/16/1993  Smokeless tobacco  . Never Used    Comment: quit 25 years ago    History  Alcohol Use No    Family History  Problem Relation Age of Onset  . Heart disease Mother   . Heart disease Father   . Pancreatic cancer Sister   . Colon cancer Neg Hx   . Malignant hyperthermia Neg Hx   . Hypertension Mother   . Hypertension Father     Review of Systems: The review of systems is per the HPI.  All other systems were reviewed and are negative.  Physical Exam: BP 132/84  Pulse 68  Ht 5' (1.524 m)  Wt 142 lb 6.4 oz (64.592 kg)  BMI 27.81 kg/m2  SpO2 97% Patient is very pleasant and in no acute distress. Weight is up 5 pounds. Skin is warm and dry. Color is normal.  HEENT is unremarkable. Normocephalic/atraumatic. PERRL. Sclera are nonicteric. Neck is supple. No masses. No JVD. Lungs are clear. Cardiac exam shows a regular rate and rhythm. Harsh outflow murmur noted. Abdomen is soft. Extremities are without edema. Gait and ROM are intact. No gross neurologic deficits noted.  Wt Readings from Last 3 Encounters:  02/02/14 142 lb 6.4 oz (64.592 kg)  09/05/13 137 lb (62.143 kg)  08/13/13 138 lb 6 oz (62.766 kg)    LABORATORY DATA/PROCEDURES:  Lab Results  Component Value Date   WBC 10.2 09/05/2013   HGB 10.0* 09/05/2013   HCT 30.1* 09/05/2013   PLT 257.0 09/05/2013   GLUCOSE 110* 09/05/2013   CHOL 171 09/05/2013   TRIG 286.0* 09/05/2013   HDL 71.00 09/05/2013   LDLCALC 43 09/05/2013   ALT 16 09/05/2013   AST  19 09/05/2013   NA 140 09/05/2013   K 3.1* 09/05/2013   CL 105 09/05/2013   CREATININE 2.7* 09/05/2013   BUN 46* 09/05/2013   CO2 25 09/05/2013   TSH 0.53 09/05/2013   HGBA1C 5.8* 07/16/2013    BNP (last 3 results)  Recent Labs  07/16/13  [place]   Echo Study Conclusions from 07/2013  - Left ventricle: The cavity size was normal. Wall thickness was increased in a pattern of mild LVH. Systolic function was normal. The estimated ejection fraction was in the range of 60% to 65%. Wall motion was normal; there were no regional wall motion abnormalities. Doppler parameters are consistent with abnormal left ventricular relaxation (grade 1 diastolic dysfunction). Doppler parameters are consistent with high ventricular filling pressure. - Aortic valve: There was moderate to severe stenosis. Valve area: 0.75cm^2(VTI). Valve area: 0.86cm^2 (Vmax). - Mitral valve: Calcified annulus. Mild regurgitation. - Left atrium: The atrium was moderately dilated. - Pulmonary arteries: Systolic pressure was mildly increased. Impressions:  - Normal LV function; calcified aortic valve with moderate to severe AS (mean gradient 36 mmHg).    Assessment / Plan: 1. Aortic stenosis -  Moderate to severe per echo from April - no cardinal symptoms. Would repeat her echo in April of 2016. See Dr. Ron Parker in April of 2016 as well.   2. HTN - BP great on current regimen.   3. HLD   4. Prior bilateral lung transplants - followed by [facility]   5. CKD - seeing nephrology.   Stable from our standpoint. No change in current regimen.   Patient is agreeable to this plan and will call if any problems develop in the interim.   Burtis Junes, RN, [facility] [address] [place], [place]  [id number] [phone]

## 2014-02-02 NOTE — Patient Instructions (Signed)
Follow up in 6-8 weeks to recheck depression Increase your Celexa to 40mg  daily- 2 of what you have at home and 1 of the new script We'll notify you of your lab results and make any changes if needed Try and schedule an appt with a therapist- it will help get to the bottom of your feelings Try and schedule activities 3-4x/week- you will be much less likely to skip them Call with any questions or concerns Happy Fall!!!

## 2014-02-02 NOTE — Progress Notes (Signed)
Pre visit review using our clinic review tool, if applicable. No additional management support is needed unless otherwise documented below in the visit note. 

## 2014-02-02 NOTE — Patient Instructions (Signed)
Stay on your current medicines  See Dr. Ron Parker in 6 months  Echo in 6 months  Call the [place] office at [phone] if you have any questions, problems or concerns.

## 2014-02-02 NOTE — Progress Notes (Signed)
   Subjective:    Patient ID: Nancy Blair, female    DOB: 11/25/1935, 78 y.o.   MRN: [id number]  HPI HTN- chronic problem, adequate control.  On Coreg, Imdur.  No CP, SOB above baseline, HAs, visual changes, edema.  Hyperlipidemia- chronic problem, on Pravastatin.  No abd pain, N/V.  Hypothyroid- chronic problem, on levothyroxine, + fatigue.  No constipation, no changes to skin/hair/nails.  Depression- chronic problem.  On Celexa.  Daughters report decreased motivation, low interest in self care.  Husband passed away, pt moved to [place].  Family is in conflict.   Review of Systems For ROS see HPI     Objective:   Physical Exam  Vitals reviewed. Constitutional: She is oriented to person, place, and time. She appears well-developed and well-nourished. No distress.  HENT:  Head: Normocephalic and atraumatic.  Eyes: Conjunctivae and EOM are normal. Pupils are equal, round, and reactive to light.  Neck: Normal range of motion. Neck supple. No thyromegaly present.  Cardiovascular: Normal rate, regular rhythm and intact distal pulses.   Murmur (III/VI SEM) heard. Pulmonary/Chest: Effort normal and breath sounds normal. No respiratory distress.  Abdominal: Soft. She exhibits no distension. There is no tenderness.  Musculoskeletal: She exhibits no edema.  Lymphadenopathy:    She has no cervical adenopathy.  Neurological: She is alert and oriented to person, place, and time.  Skin: Skin is warm and dry.  Psychiatric: She has a normal mood and affect. Her behavior is normal.          Assessment & Plan:

## 2014-02-03 ENCOUNTER — Other Ambulatory Visit: Payer: Self-pay | Admitting: General Practice

## 2014-02-03 MED ORDER — FENOFIBRATE 160 MG PO TABS: 160.0000 mg | ORAL_TABLET | Freq: Every day | ORAL | Status: DC

## 2014-02-03 NOTE — Assessment & Plan Note (Signed)
Chronic problem.  Check labs.  Adjust meds prn  

## 2014-02-03 NOTE — Assessment & Plan Note (Signed)
Deteriorated.  Pt not motivated to groom herself, participate in activities, go shopping as needed.  Daughters are frustrated, as is pt.  After long discussion w/ pt and family, will increase SSRI.  Encouraged her to consider counseling.  Also advised that she needed to schedule activities in her community to give her day purpose.  Will follow closely.

## 2014-02-03 NOTE — Assessment & Plan Note (Signed)
Chronic problem.  Adequate control.  Asymptomatic.  Check labs.  No anticipated med changes 

## 2014-02-03 NOTE — Assessment & Plan Note (Signed)
Chronic problem.  Tolerating meds w/o difficulty.  Check labs.  Adjust meds prn  

## 2014-03-10 ENCOUNTER — Other Ambulatory Visit: Payer: Self-pay | Admitting: General Practice

## 2014-03-10 MED ORDER — LEVOTHYROXINE SODIUM 88 MCG PO TABS: 88.0000 ug | ORAL_TABLET | Freq: Every day | ORAL | Status: DC

## 2014-03-11 ENCOUNTER — Ambulatory Visit: Payer: Medicare Other | Admitting: Family Medicine

## 2014-03-23 ENCOUNTER — Ambulatory Visit: Payer: Medicare Other | Admitting: Family Medicine

## 2014-04-04 ENCOUNTER — Other Ambulatory Visit: Payer: Self-pay | Admitting: Family Medicine

## 2014-04-07 ENCOUNTER — Other Ambulatory Visit: Payer: Self-pay | Admitting: Family Medicine

## 2014-04-07 NOTE — Telephone Encounter (Signed)
Med filled.  

## 2014-04-08 ENCOUNTER — Other Ambulatory Visit: Payer: Self-pay | Admitting: Family Medicine

## 2014-04-08 NOTE — Telephone Encounter (Signed)
Med filled.  

## 2014-04-27 ENCOUNTER — Inpatient Hospital Stay (HOSPITAL_COMMUNITY): Payer: Medicare Other

## 2014-04-27 ENCOUNTER — Inpatient Hospital Stay (HOSPITAL_COMMUNITY)
Admission: EM | Admit: 2014-04-27 | Discharge: 2014-05-01 | DRG: 392 | Disposition: A | Discharge status: Home / Self Care | Payer: Medicare Other | Attending: Internal Medicine | Admitting: Internal Medicine

## 2014-04-27 ENCOUNTER — Encounter (HOSPITAL_COMMUNITY): Payer: Self-pay | Admitting: Emergency Medicine

## 2014-04-27 ENCOUNTER — Emergency Department (HOSPITAL_COMMUNITY): Payer: Medicare Other

## 2014-04-27 DIAGNOSIS — E039 Hypothyroidism, unspecified: Secondary | ICD-10-CM | POA: Diagnosis not present

## 2014-04-27 DIAGNOSIS — J449 Chronic obstructive pulmonary disease, unspecified: Secondary | ICD-10-CM | POA: Diagnosis present

## 2014-04-27 DIAGNOSIS — R197 Diarrhea, unspecified: Secondary | ICD-10-CM

## 2014-04-27 DIAGNOSIS — K759 Inflammatory liver disease, unspecified: Secondary | ICD-10-CM | POA: Diagnosis not present

## 2014-04-27 DIAGNOSIS — E861 Hypovolemia: Secondary | ICD-10-CM | POA: Diagnosis not present

## 2014-04-27 DIAGNOSIS — Z87891 Personal history of nicotine dependence: Secondary | ICD-10-CM

## 2014-04-27 DIAGNOSIS — Z79899 Other long term (current) drug therapy: Secondary | ICD-10-CM | POA: Diagnosis not present

## 2014-04-27 DIAGNOSIS — E440 Moderate protein-calorie malnutrition: Secondary | ICD-10-CM | POA: Diagnosis not present

## 2014-04-27 DIAGNOSIS — F329 Major depressive disorder, single episode, unspecified: Secondary | ICD-10-CM | POA: Diagnosis present

## 2014-04-27 DIAGNOSIS — Z91018 Allergy to other foods: Secondary | ICD-10-CM | POA: Diagnosis not present

## 2014-04-27 DIAGNOSIS — Z880 Allergy status to penicillin: Secondary | ICD-10-CM | POA: Diagnosis not present

## 2014-04-27 DIAGNOSIS — K8020 Calculus of gallbladder without cholecystitis without obstruction: Secondary | ICD-10-CM

## 2014-04-27 DIAGNOSIS — R7401 Elevation of levels of liver transaminase levels: Secondary | ICD-10-CM

## 2014-04-27 DIAGNOSIS — N184 Chronic kidney disease, stage 4 (severe): Secondary | ICD-10-CM | POA: Diagnosis not present

## 2014-04-27 DIAGNOSIS — Z6826 Body mass index (BMI) 26.0-26.9, adult: Secondary | ICD-10-CM

## 2014-04-27 DIAGNOSIS — I129 Hypertensive chronic kidney disease with stage 1 through stage 4 chronic kidney disease, or unspecified chronic kidney disease: Secondary | ICD-10-CM | POA: Diagnosis present

## 2014-04-27 DIAGNOSIS — K219 Gastro-esophageal reflux disease without esophagitis: Secondary | ICD-10-CM | POA: Diagnosis not present

## 2014-04-27 DIAGNOSIS — R740 Nonspecific elevation of levels of transaminase and lactic acid dehydrogenase [LDH]: Secondary | ICD-10-CM

## 2014-04-27 DIAGNOSIS — I5032 Chronic diastolic (congestive) heart failure: Secondary | ICD-10-CM | POA: Diagnosis not present

## 2014-04-27 DIAGNOSIS — E785 Hyperlipidemia, unspecified: Secondary | ICD-10-CM | POA: Diagnosis not present

## 2014-04-27 DIAGNOSIS — Z7982 Long term (current) use of aspirin: Secondary | ICD-10-CM

## 2014-04-27 DIAGNOSIS — K838 Other specified diseases of biliary tract: Secondary | ICD-10-CM

## 2014-04-27 DIAGNOSIS — R932 Abnormal findings on diagnostic imaging of liver and biliary tract: Secondary | ICD-10-CM | POA: Diagnosis not present

## 2014-04-27 DIAGNOSIS — F419 Anxiety disorder, unspecified: Secondary | ICD-10-CM | POA: Diagnosis present

## 2014-04-27 DIAGNOSIS — E2740 Unspecified adrenocortical insufficiency: Secondary | ICD-10-CM | POA: Diagnosis present

## 2014-04-27 DIAGNOSIS — R748 Abnormal levels of other serum enzymes: Secondary | ICD-10-CM | POA: Diagnosis not present

## 2014-04-27 DIAGNOSIS — N179 Acute kidney failure, unspecified: Secondary | ICD-10-CM | POA: Diagnosis present

## 2014-04-27 DIAGNOSIS — D638 Anemia in other chronic diseases classified elsewhere: Secondary | ICD-10-CM | POA: Diagnosis not present

## 2014-04-27 DIAGNOSIS — N390 Urinary tract infection, site not specified: Secondary | ICD-10-CM | POA: Diagnosis present

## 2014-04-27 DIAGNOSIS — Z888 Allergy status to other drugs, medicaments and biological substances status: Secondary | ICD-10-CM

## 2014-04-27 DIAGNOSIS — N281 Cyst of kidney, acquired: Secondary | ICD-10-CM | POA: Diagnosis not present

## 2014-04-27 DIAGNOSIS — E860 Dehydration: Secondary | ICD-10-CM | POA: Diagnosis present

## 2014-04-27 DIAGNOSIS — D899 Disorder involving the immune mechanism, unspecified: Secondary | ICD-10-CM | POA: Diagnosis present

## 2014-04-27 DIAGNOSIS — Z942 Lung transplant status: Secondary | ICD-10-CM

## 2014-04-27 DIAGNOSIS — E119 Type 2 diabetes mellitus without complications: Secondary | ICD-10-CM | POA: Diagnosis present

## 2014-04-27 DIAGNOSIS — J984 Other disorders of lung: Secondary | ICD-10-CM | POA: Diagnosis not present

## 2014-04-27 DIAGNOSIS — I350 Nonrheumatic aortic (valve) stenosis: Secondary | ICD-10-CM

## 2014-04-27 DIAGNOSIS — R17 Unspecified jaundice: Secondary | ICD-10-CM | POA: Diagnosis not present

## 2014-04-27 DIAGNOSIS — I10 Essential (primary) hypertension: Secondary | ICD-10-CM | POA: Diagnosis present

## 2014-04-27 HISTORY — DX: Type 2 diabetes mellitus without complications: E11.9

## 2014-04-27 LAB — URINALYSIS, ROUTINE W REFLEX MICROSCOPIC
Glucose, UA: NEGATIVE mg/dL
KETONES UR: NEGATIVE mg/dL
Nitrite: POSITIVE — AB
PH: 5 (ref 5.0–8.0)
PROTEIN: 30 mg/dL — AB
Specific Gravity, Urine: 1.016 (ref 1.005–1.030)
Urobilinogen, UA: 0.2 mg/dL (ref 0.0–1.0)

## 2014-04-27 LAB — COMPREHENSIVE METABOLIC PANEL
ALBUMIN: 3.2 g/dL — AB (ref 3.5–5.2)
ALBUMIN: 2.9 g/dL — AB (ref 3.5–5.2)
ALK PHOS: 54 U/L (ref 39–117)
ALT: 39 U/L — AB (ref 0–35)
ALT: 37 U/L — ABNORMAL HIGH (ref 0–35)
ANION GAP: 10 (ref 5–15)
AST: 78 U/L — ABNORMAL HIGH (ref 0–37)
AST: 73 U/L — AB (ref 0–37)
Alkaline Phosphatase: 49 U/L (ref 39–117)
Anion gap: 7 (ref 5–15)
BILIRUBIN TOTAL: 2 mg/dL — AB (ref 0.3–1.2)
BUN: 65 mg/dL — ABNORMAL HIGH (ref 6–23)
BUN: 65 mg/dL — ABNORMAL HIGH (ref 6–23)
CHLORIDE: 108 meq/L (ref 96–112)
CO2: 19 mmol/L (ref 19–32)
CO2: 19 mmol/L (ref 19–32)
CREATININE: 4.31 mg/dL — AB (ref 0.50–1.10)
Calcium: 8.8 mg/dL (ref 8.4–10.5)
Calcium: 8.3 mg/dL — ABNORMAL LOW (ref 8.4–10.5)
Chloride: 102 mEq/L (ref 96–112)
Creatinine, Ser: 4.35 mg/dL — ABNORMAL HIGH (ref 0.50–1.10)
GFR calc Af Amer: 11 mL/min — ABNORMAL LOW (ref >90)
GFR calc Af Amer: 10 mL/min — ABNORMAL LOW (ref >90)
GFR calc non Af Amer: 9 mL/min — ABNORMAL LOW (ref >90)
GFR, EST NON AFRICAN AMERICAN: 9 mL/min — AB (ref >90)
Glucose, Bld: 135 mg/dL — ABNORMAL HIGH (ref 70–99)
Glucose, Bld: 116 mg/dL — ABNORMAL HIGH (ref 70–99)
Potassium: 4.4 mmol/L (ref 3.5–5.1)
Potassium: 3.8 mmol/L (ref 3.5–5.1)
SODIUM: 134 mmol/L — AB (ref 135–145)
Sodium: 131 mmol/L — ABNORMAL LOW (ref 135–145)
TOTAL PROTEIN: 6.2 g/dL (ref 6.0–8.3)
Total Bilirubin: 2.3 mg/dL — ABNORMAL HIGH (ref 0.3–1.2)
Total Protein: 6.8 g/dL (ref 6.0–8.3)

## 2014-04-27 LAB — CBC WITH DIFFERENTIAL/PLATELET
BASOS ABS: 0.1 10*3/uL (ref 0.0–0.1)
Basophils Relative: 1 % (ref 0–1)
Eosinophils Absolute: 0.2 10*3/uL (ref 0.0–0.7)
Eosinophils Relative: 2 % (ref 0–5)
HEMATOCRIT: 36.2 % (ref 36.0–46.0)
Hemoglobin: 11.5 g/dL — ABNORMAL LOW (ref 12.0–15.0)
LYMPHS PCT: 9 % — AB (ref 12–46)
Lymphs Abs: 1 10*3/uL (ref 0.7–4.0)
MCH: 31.5 pg (ref 26.0–34.0)
MCHC: 31.8 g/dL (ref 30.0–36.0)
MCV: 99.2 fL (ref 78.0–100.0)
Monocytes Absolute: 1 10*3/uL (ref 0.1–1.0)
Monocytes Relative: 10 % (ref 3–12)
Neutro Abs: 8.2 10*3/uL — ABNORMAL HIGH (ref 1.7–7.7)
Neutrophils Relative %: 78 % — ABNORMAL HIGH (ref 43–77)
Platelets: 293 10*3/uL (ref 150–400)
RBC: 3.65 MIL/uL — AB (ref 3.87–5.11)
RDW: 15.8 % — ABNORMAL HIGH (ref 11.5–15.5)
WBC: 10.5 10*3/uL (ref 4.0–10.5)

## 2014-04-27 LAB — MAGNESIUM
MAGNESIUM: 2.5 mg/dL (ref 1.5–2.5)
Magnesium: 2.4 mg/dL (ref 1.5–2.5)

## 2014-04-27 LAB — PHOSPHORUS: Phosphorus: 4.7 mg/dL — ABNORMAL HIGH (ref 2.3–4.6)

## 2014-04-27 LAB — URINE MICROSCOPIC-ADD ON

## 2014-04-27 MED ORDER — SULFAMETHOXAZOLE-TRIMETHOPRIM 400-80 MG PO TABS
1.0000 | ORAL_TABLET | ORAL | Status: DC
  Administered 2014-04-29 – 2014-05-01 (×2): 1 via ORAL
  Filled 2014-04-27 (×2): qty 1

## 2014-04-27 MED ORDER — PRAVASTATIN SODIUM 20 MG PO TABS
20.0000 mg | ORAL_TABLET | Freq: Every day | ORAL | Status: DC
  Administered 2014-04-27 – 2014-04-30 (×4): 20 mg via ORAL
  Filled 2014-04-27 (×5): qty 1

## 2014-04-27 MED ORDER — LEVOTHYROXINE SODIUM 88 MCG PO TABS
88.0000 ug | ORAL_TABLET | Freq: Every day | ORAL | Status: DC
  Administered 2014-04-28 – 2014-05-01 (×4): 88 ug via ORAL
  Filled 2014-04-27 (×5): qty 1

## 2014-04-27 MED ORDER — OXYCODONE HCL 5 MG PO TABS: 5.0000 mg | ORAL_TABLET | ORAL | Status: DC | PRN

## 2014-04-27 MED ORDER — ASPIRIN 81 MG PO CHEW
81.0000 mg | CHEWABLE_TABLET | Freq: Every day | ORAL | Status: DC
  Administered 2014-04-28 – 2014-05-01 (×4): 81 mg via ORAL
  Filled 2014-04-27 (×4): qty 1

## 2014-04-27 MED ORDER — POTASSIUM CHLORIDE CRYS ER 20 MEQ PO TBCR
20.0000 meq | EXTENDED_RELEASE_TABLET | Freq: Every day | ORAL | Status: DC
  Administered 2014-04-28 – 2014-05-01 (×4): 20 meq via ORAL
  Filled 2014-04-27 (×4): qty 1

## 2014-04-27 MED ORDER — HYDROCORTISONE 20 MG PO TABS
20.0000 mg | ORAL_TABLET | Freq: Two times a day (BID) | ORAL | Status: DC
  Administered 2014-04-28 – 2014-05-01 (×7): 20 mg via ORAL
  Filled 2014-04-27 (×9): qty 1

## 2014-04-27 MED ORDER — SODIUM CHLORIDE 0.9 % IV SOLN
INTRAVENOUS | Status: DC
  Administered 2014-04-27: 23:00:00 via INTRAVENOUS
  Administered 2014-04-28 – 2014-04-29 (×2): 1000 mL via INTRAVENOUS
  Administered 2014-04-29 – 2014-04-30 (×2): via INTRAVENOUS

## 2014-04-27 MED ORDER — ONDANSETRON HCL 4 MG/2ML IJ SOLN: 4.0000 mg | Freq: Four times a day (QID) | INTRAMUSCULAR | Status: DC | PRN

## 2014-04-27 MED ORDER — FERROUS SULFATE 325 (65 FE) MG PO TABS
325.0000 mg | ORAL_TABLET | Freq: Every day | ORAL | Status: DC
  Administered 2014-04-28 – 2014-05-01 (×4): 325 mg via ORAL
  Filled 2014-04-27 (×4): qty 1

## 2014-04-27 MED ORDER — VALGANCICLOVIR HCL 450 MG PO TABS
450.0000 mg | ORAL_TABLET | ORAL | Status: DC
  Administered 2014-04-29 – 2014-05-01 (×2): 450 mg via ORAL
  Filled 2014-04-27 (×4): qty 1

## 2014-04-27 MED ORDER — MORPHINE SULFATE 2 MG/ML IJ SOLN: 1.0000 mg | INTRAMUSCULAR | Status: DC | PRN

## 2014-04-27 MED ORDER — ISOSORBIDE MONONITRATE 15 MG HALF TABLET
15.0000 mg | ORAL_TABLET | Freq: Every day | ORAL | Status: DC
  Administered 2014-04-28 – 2014-05-01 (×4): 15 mg via ORAL
  Filled 2014-04-27 (×4): qty 1

## 2014-04-27 MED ORDER — ENOXAPARIN SODIUM 40 MG/0.4ML ~~LOC~~ SOLN
40.0000 mg | Freq: Every day | SUBCUTANEOUS | Status: DC
Start: 2014-04-27 — End: 2014-04-28
  Administered 2014-04-27: 40 mg via SUBCUTANEOUS
  Filled 2014-04-27 (×2): qty 0.4

## 2014-04-27 MED ORDER — GUAIFENESIN ER 600 MG PO TB12
600.0000 mg | ORAL_TABLET | Freq: Two times a day (BID) | ORAL | Status: DC | PRN
  Filled 2014-04-27: qty 1

## 2014-04-27 MED ORDER — ADULT MULTIVITAMIN W/MINERALS CH
1.0000 | ORAL_TABLET | Freq: Every day | ORAL | Status: DC
  Administered 2014-04-28 – 2014-05-01 (×4): 1 via ORAL
  Filled 2014-04-27 (×4): qty 1

## 2014-04-27 MED ORDER — ONDANSETRON HCL 4 MG PO TABS: 4.0000 mg | ORAL_TABLET | Freq: Four times a day (QID) | ORAL | Status: DC | PRN

## 2014-04-27 MED ORDER — ZOLPIDEM TARTRATE 5 MG PO TABS
5.0000 mg | ORAL_TABLET | Freq: Every evening | ORAL | Status: DC | PRN
  Administered 2014-04-28 – 2014-04-30 (×2): 5 mg via ORAL
  Filled 2014-04-27 (×2): qty 1

## 2014-04-27 NOTE — H&P (Signed)
[facility] History and Physical  Nancy Blair:[id number] DOB: [birth year] DOA: 04/27/2014  Referring physician: ED physician PCP: Annye Asa, MD   Chief Complaint: weakness   HPI:  Pt is very pleasant 79 yo female with COPD/emhysema, lung caner and s/p bilateral lung transplant 4 years ago, currently on immunosuppressive therapy CellCept and cyclosporine, also on prophylactic antibiotic Bactrim, valcyte (antiviral), presented to [facility] emergency department with main concern of several weeks duration of progressively worsening weakness, poor oral intake, failure to thrive, persistent watery diarrhea. Patient explains she saw her primary care physician several months ago with similar complaints, no specific medications started. Patient's symptoms have gotten progressively worse and family at bedside reports that patient is unable to make it to the bathroom on time due to severity of diarrhea. She describes diarrhea as as watery, nonbloody, 4-7 episodes per day. Patient denies chest pain or shortness of breath, no specific abdominal pain, no vomiting, no fevers and chills, no recent sick contacts or exposures. In addition patient explains she is not taking any specific antibiotic other than the one for prophylaxis as noted above.  In emergency department, patient noted to be hemodynamically stable, vital signs stable, blood work notable for creatinine 4.3, bilirubin ~2. Tried hospitalist asked to admit for further evaluation and management.   Assessment and Plan: Active Problems: Diarrhea  - With subsequent dehydration - Admit to medical bed as patient is stable at this time - We'll start with providing supportive care with IV fluids, analgesia if needed - Hold off on antidiarrheal medications until stool studies results back - We'll check stool panel, ova and parasites, stool culture, C. difficile Hyperbilirubinemia with transaminitis  - Patient denies abdominal pain,  skin looks slightly jaundiced but patient explains this is her baseline  - Will obtain abdominal ultrasound, repeat liver function panel  - holding Cellcept and cyclosporine for now  Acute on chronic kidney disease stage 3-4 - Creatinine at baseline around 2, currently above baseline due to prerenal etiology  - We'll provide IV fluids, will hold Lasix the patient takes at home  - Repeat BMP in the morning  Immunocompromise state  - Hold off on immunosuppressive therapy for now until renal function stabilizes and patient more hydrated  - May be able to resume in the morning  - Continue hydrocortisone tablet  Status post bilateral lung transplant  - Currently does not require oxygen, respiratory status stable  Moderate malnutrition  - In the setting of fairly chronic illness, diarrhea and poor oral intake  - Advance diet as patient able to tolerate  Hypothyroidism - continue synthroid   Lovenox SQ for DVT prophylaxis  Radiological Exams on Admission: CXR 04/27/2014  Lingular scarring.  No active disease.    Code Status: Full Family Communication: Pt and daughter at bedside Disposition Plan: Admit for further evaluation     Review of Systems:  Constitutional: Negative for diaphoresis.  HENT: Negative for hearing loss, ear pain, nosebleeds, congestion, and ear discharge.   Eyes: Negative for blurred vision, double vision, photophobia, pain, discharge and redness.  Respiratory: Negative for cough, hemoptysis,  wheezing and stridor.   Cardiovascular: Negative for chest pain, palpitations, orthopnea, claudication and leg swelling.  Gastrointestinal: Negative for heartburn, constipation, blood in stool and melena.  Genitourinary: Negative for dysuria, urgency, frequency, hematuria and flank pain.  Musculoskeletal: Negative for myalgias, back pain, joint pain and falls.  Skin: Negative for itching and rash.  Neurological: Negative for dizziness and weakness.  Endo/Heme/Allergies:  Negative  for environmental allergies and polydipsia. Does not bruise/bleed easily.  Psychiatric/Behavioral: Negative for suicidal ideas. The patient is not nervous/anxious.      Past Medical History  Diagnosis Date  . Emphysema of lung   . Hypertension   . Hyperlipidemia   . Allergy   . Anemia   . GERD (gastroesophageal reflux disease)   . Adrenal insufficiency   . Squamous acanthoma of skin 06/2011    rt leg  . Hypothyroidism   . CKD (chronic kidney disease) stage 4, GFR 15-29 ml/min     since 2012 (per [facility] records)  . Lung transplant status, bilateral   . Acute on chronic diastolic heart failure   . Ejection fraction   . Diabetes mellitus without complication     Past Surgical History  Procedure Laterality Date  . Appendectomy    . Abdominal hysterectomy    . Tonsillectomy    . Total lung replacement  2012    b/l lung transplant  . Esophagogastroduodenoscopy  05/26/2011    Procedure: ESOPHAGOGASTRODUODENOSCOPY (EGD);  Surgeon: Owens Loffler, MD;  Location: [facility];  Service: Endoscopy;  Laterality: N/A;  . Nissen fundoplication      Social History:  reports that she quit smoking about 20 years ago. She started smoking about 62 years ago. She has never used smokeless tobacco. She reports that she does not drink alcohol or use illicit drugs.  Allergies  Allergen Reactions  . Grapefruit Extract Anaphylaxis  . Oysters [Shellfish Allergy] Anaphylaxis  . Pineapple Anaphylaxis  . Bupropion Other (See Comments)    sleepy  . Neomycin-Bacitracin Zn-Polymyx Hives and Other (See Comments)    blisters  . Penicillins Swelling    Just face swelling  . Prednisone Other (See Comments)    crazy    Family History  Problem Relation Age of Onset  . Heart disease Mother   . Heart disease Father   . Pancreatic cancer Sister   . Colon cancer Neg Hx   . Malignant hyperthermia Neg Hx   . Hypertension Mother   . Hypertension Father     Prior to Admission medications    Medication Sig Start Date End Date Taking? Authorizing Provider  aspirin 81 MG chewable tablet Chew 81 mg by mouth daily.   Yes Historical Provider, MD  carvedilol (COREG) 6.25 MG tablet Take 1 tablet (6.25 mg total) by mouth 2 (two) times daily with a meal. 02/07/12  Yes Bonnielee Haff, MD  citalopram (CELEXA) 20 MG tablet Take 20 mg by mouth daily.   Yes Historical Provider, MD  cycloSPORINE modified (NEORAL) 25 MG capsule Take 75-100 mg by mouth 2 (two) times daily. 75mg  in the morning and 100 mg in the evening   Yes Historical Provider, MD  ferrous sulfate 325 (65 FE) MG tablet Take 325 mg by mouth daily with breakfast.   Yes Historical Provider, MD  furosemide (LASIX) 20 MG tablet Take 1 tablet (20 mg total) by mouth every other day. Takes on Monday, Wednesday, and Friday 10/23/13  Yes Midge Minium, MD  guaiFENesin (MUCINEX) 600 MG 12 hr tablet Take 600 mg by mouth 2 (two) times daily as needed for cough or to loosen phlegm.   Yes Historical Provider, MD  hydrocortisone (CORTEF) 20 MG tablet Take by mouth 2 (two) times daily.  10/27/11  Yes Historical Provider, MD  insulin regular (NOVOLIN R,HUMULIN R) 100 units/mL injection Inject 1-3 Units into the skin at bedtime as needed for high blood sugar. On sliding scale  Yes Historical Provider, MD  isosorbide mononitrate (IMDUR) 30 MG 24 hr tablet Take 0.5 tablets (15 mg total) by mouth daily. 10/23/13  Yes Midge Minium, MD  levothyroxine (SYNTHROID, LEVOTHROID) 88 MCG tablet Take 1 tablet (88 mcg total) by mouth daily before breakfast. 03/10/14  Yes Midge Minium, MD  Multiple Vitamin (MULITIVITAMIN WITH MINERALS) TABS Take 1 tablet by mouth daily.   Yes Historical Provider, MD  mycophenolate (CELLCEPT) 500 MG tablet Take 500 mg by mouth 2 (two) times daily.  12/21/11  Yes Historical Provider, MD  potassium chloride SA (K-DUR,KLOR-CON) 20 MEQ tablet Take 1 tablet (20 mEq total) by mouth daily. 09/05/13  Yes Midge Minium, MD   pravastatin (PRAVACHOL) 20 MG tablet TAKE 1 TABLET BY MOUTH EVERY NIGHT AT BEDTIME 04/08/14  Yes Midge Minium, MD  sulfamethoxazole-trimethoprim (BACTRIM,SEPTRA) 400-80 MG per tablet Take 1 tablet by mouth every Monday, Wednesday, and Friday.    Yes Historical Provider, MD  valGANciclovir (VALCYTE) 450 MG tablet Take 450 mg by mouth every Monday, Wednesday, and Friday.    Yes Historical Provider, MD  zaleplon (SONATA) 5 MG capsule Take 5 mg by mouth at bedtime.   Yes Historical Provider, MD  citalopram (CELEXA) 40 MG tablet Take 1 tablet (40 mg total) by mouth daily. Patient not taking: Reported on 04/27/2014 02/02/14   Midge Minium, MD  Potassium Chloride ER 20 MEQ TBCR TAKE 1 TABLET BY MOUTH ONCE DAILY Patient not taking: Reported on 04/27/2014 04/04/14   Midge Minium, MD    Physical Exam: Filed Vitals:   04/27/14 1200 04/27/14 1245 04/27/14 1354 04/27/14 1543  BP: 100/50 114/72  114/62  Pulse: 73 70  74  Temp:   96.4 F (35.8 C) 97.9 F (36.6 C)  TempSrc:   Rectal Rectal  Resp:  19  16  SpO2: 98% 98%  99%    Physical Exam  Constitutional: Appears well-developed and well-nourished. No distress.  HENT: Normocephalic. External right and left ear normal. Dry MM Eyes: Conjunctivae and EOM are normal. PERRLA, no scleral icterus.  Neck: Normal ROM. Neck supple. No JVD. No tracheal deviation. No thyromegaly.  CVS: RRR, S1/S2 +, no murmurs, no gallops, no carotid bruit.  Pulmonary: Effort and breath sounds normal, no stridor, rhonchi, wheezes Abdominal: Soft. BS +,  no distension, tenderness, rebound or guarding.  Musculoskeletal: Normal range of motion. No edema and no tenderness.  Lymphadenopathy: No lymphadenopathy noted, cervical, inguinal. Neuro: Alert. Normal reflexes, muscle tone coordination. No cranial nerve deficit. Skin: Skin is warm and dry. Mild jaundice  Psychiatric: Normal mood and affect. Behavior, judgment, thought content normal.   Labs on Admission:   Basic Metabolic Panel:  Recent Labs Lab 04/27/14 1446  NA 134*  K 4.4  CL 108  CO2 19  GLUCOSE 135*  BUN 65*  CREATININE 4.31*  CALCIUM 8.8  MG 2.5   Liver Function Tests:  Recent Labs Lab 04/27/14 1446  AST 78*  ALT 39*  ALKPHOS 54  BILITOT 2.3*  PROT 6.8  ALBUMIN 3.2*   CBC:  Recent Labs Lab 04/27/14 1446  WBC 10.5  NEUTROABS 8.2*  HGB 11.5*  HCT 36.2  MCV 99.2  PLT 293    EKG: Normal sinus rhythm, no ST/T wave changes  Faye Ramsay, MD  [facility] Pager [phone]  If 7PM-7AM, please contact night-coverage [email or web address] Password [facility] 04/27/2014, 5:33 PM

## 2014-04-27 NOTE — ED Notes (Signed)
MD at bedside. 

## 2014-04-27 NOTE — ED Provider Notes (Signed)
CSN: [id number]     Arrival date & time 04/27/14  1133 History   First MD Initiated Contact with Patient 04/27/14 1359     Chief Complaint  Patient presents with  . Fatigue  . Diarrhea  . Anorexia     (Consider location/radiation/quality/duration/timing/severity/associated sxs/prior Treatment) HPI   Nancy Blair is a 79 y.o. female here with family members for evaluation of general weakness, decreased appetite and diarrhea.  The patient states that she is "just not hungry".  She is making fewer trips to the cafeteria at her retirement community.  She saw her PCP several months ago with similar complaints, and the PCP thought she had depression.  She is taking an anti-depressants.  She follows up regularly at [facility] transplant service for history of bilateral lung transplant.  She has had diarrhea for many years, which has worsened in the last several days.  She is currently having 4, round, watery stools, each day.  She takes chronic Septra for prophylaxis of infection, but is not on any new antibiotics.  There are no other known modifying factors.   Past Medical History  Diagnosis Date  . Emphysema of lung   . Hypertension   . Hyperlipidemia   . Allergy   . Anemia   . GERD (gastroesophageal reflux disease)   . Adrenal insufficiency   . Squamous acanthoma of skin 06/2011    rt leg  . Hypothyroidism   . CKD (chronic kidney disease) stage 4, GFR 15-29 ml/min     since 2012 (per [facility])  . Lung transplant status, bilateral   . Acute on chronic diastolic heart failure   . Ejection fraction   . Diabetes mellitus without complication    Past Surgical History  Procedure Laterality Date  . Appendectomy    . Abdominal hysterectomy    . Tonsillectomy    . Total lung replacement  2012    b/l lung transplant  . Esophagogastroduodenoscopy  05/26/2011    Procedure: ESOPHAGOGASTRODUODENOSCOPY (EGD);  Surgeon: Owens Loffler, MD;  Location: [facility];  Service: Endoscopy;   Laterality: N/A;   Family History  Problem Relation Age of Onset  . Heart disease Mother   . Heart disease Father   . Pancreatic cancer Sister   . Colon cancer Neg Hx   . Malignant hyperthermia Neg Hx   . Hypertension Mother   . Hypertension Father    History  Substance Use Topics  . Smoking status: Former Smoker -- 3.00 packs/day    Start date: 04/17/1952    Quit date: 07/16/1993  . Smokeless tobacco: Never Used     Comment: quit 25 years ago  . Alcohol Use: No   OB History    No data available     Review of Systems  All other systems reviewed and are negative.     Allergies  Grapefruit extract; Oysters; Pineapple; Bupropion; Neomycin-bacitracin zn-polymyx; Penicillins; and Prednisone  Home Medications   Prior to Admission medications   Medication Sig Start Date End Date Taking? Authorizing Provider  aspirin 81 MG chewable tablet Chew 81 mg by mouth daily.   Yes Historical Provider, MD  carvedilol (COREG) 6.25 MG tablet Take 1 tablet (6.25 mg total) by mouth 2 (two) times daily with a meal. 02/07/12  Yes Bonnielee Haff, MD  citalopram (CELEXA) 20 MG tablet Take 20 mg by mouth daily.   Yes Historical Provider, MD  cycloSPORINE modified (NEORAL) 25 MG capsule Take 75-100 mg by mouth 2 (two) times daily. 75mg   in the morning and 100 mg in the evening   Yes Historical Provider, MD  ferrous sulfate 325 (65 FE) MG tablet Take 325 mg by mouth daily with breakfast.   Yes Historical Provider, MD  furosemide (LASIX) 20 MG tablet Take 1 tablet (20 mg total) by mouth every other day. Takes on Monday, Wednesday, and Friday 10/23/13  Yes Midge Minium, MD  guaiFENesin (MUCINEX) 600 MG 12 hr tablet Take 600 mg by mouth 2 (two) times daily as needed for cough or to loosen phlegm.   Yes Historical Provider, MD  hydrocortisone (CORTEF) 20 MG tablet Take by mouth 2 (two) times daily.  10/27/11  Yes Historical Provider, MD  insulin regular (NOVOLIN R,HUMULIN R) 100 units/mL injection  Inject 1-3 Units into the skin at bedtime as needed for high blood sugar. On sliding scale   Yes Historical Provider, MD  isosorbide mononitrate (IMDUR) 30 MG 24 hr tablet Take 0.5 tablets (15 mg total) by mouth daily. 10/23/13  Yes Midge Minium, MD  levothyroxine (SYNTHROID, LEVOTHROID) 88 MCG tablet Take 1 tablet (88 mcg total) by mouth daily before breakfast. 03/10/14  Yes Midge Minium, MD  Multiple Vitamin (MULITIVITAMIN WITH MINERALS) TABS Take 1 tablet by mouth daily.   Yes Historical Provider, MD  mycophenolate (CELLCEPT) 500 MG tablet Take 500 mg by mouth 2 (two) times daily.  12/21/11  Yes Historical Provider, MD  potassium chloride SA (K-DUR,KLOR-CON) 20 MEQ tablet Take 1 tablet (20 mEq total) by mouth daily. 09/05/13  Yes Midge Minium, MD  pravastatin (PRAVACHOL) 20 MG tablet TAKE 1 TABLET BY MOUTH EVERY NIGHT AT BEDTIME 04/08/14  Yes Midge Minium, MD  sulfamethoxazole-trimethoprim (BACTRIM,SEPTRA) 400-80 MG per tablet Take 1 tablet by mouth every Monday, Wednesday, and Friday.    Yes Historical Provider, MD  valGANciclovir (VALCYTE) 450 MG tablet Take 450 mg by mouth every Monday, Wednesday, and Friday.    Yes Historical Provider, MD  zaleplon (SONATA) 5 MG capsule Take 5 mg by mouth at bedtime.   Yes Historical Provider, MD  citalopram (CELEXA) 40 MG tablet Take 1 tablet (40 mg total) by mouth daily. Patient not taking: Reported on 04/27/2014 02/02/14   Midge Minium, MD  Potassium Chloride ER 20 MEQ TBCR TAKE 1 TABLET BY MOUTH ONCE DAILY Patient not taking: Reported on 04/27/2014 04/04/14   Midge Minium, MD   BP 114/62 mmHg  Pulse 74  Temp(Src) 97.9 F (36.6 C) (Rectal)  Resp 16  SpO2 99% Physical Exam  Constitutional: She is oriented to person, place, and time. She appears well-developed.  Elderly, frail  HENT:  Head: Normocephalic and atraumatic.  Right Ear: External ear normal.  Left Ear: External ear normal.  Mucous membranes are mildly dry   Eyes: Conjunctivae and EOM are normal. Pupils are equal, round, and reactive to light.  Neck: Normal range of motion and phonation normal. Neck supple.  Cardiovascular: Normal rate, regular rhythm and normal heart sounds.   Pulmonary/Chest: Effort normal and breath sounds normal. She exhibits no bony tenderness.  Abdominal: Soft. There is no tenderness.  Musculoskeletal: Normal range of motion.  Neurological: She is alert and oriented to person, place, and time. No cranial nerve deficit or sensory deficit. She exhibits normal muscle tone. Coordination normal.  Skin: Skin is warm, dry and intact.  Psychiatric: She has a normal mood and affect. Her behavior is normal. Judgment and thought content normal.  Nursing note and vitals reviewed.   ED Course  Procedures (including critical care time)  Medications - No data to display  Patient Vitals for the past 24 hrs:  BP Temp Temp src Pulse Resp SpO2  04/27/14 1543 114/62 mmHg 97.9 F (36.6 C) Rectal 74 16 99 %  04/27/14 1354 - (!) 96.4 F (35.8 C) Rectal - - -  04/27/14 1245 114/72 mmHg - - 70 19 98 %  04/27/14 1200 (!) 100/50 mmHg - - 73 - 98 %  04/27/14 1155 (!) 100/50 mmHg - - 70 18 97 %    4:54 PM Reevaluation with update and discussion. After initial assessment and treatment, an updated evaluation reveals rectal temperature, spontaneously improved.Daleen Bo L   4:54 PM-Consult complete with Dr. Charlies Silvers. Patient case explained and discussed. She agrees to admit patient for further evaluation and treatment. Call ended at 17:00    Labs Review Labs Reviewed  COMPREHENSIVE METABOLIC PANEL - Abnormal; Notable for the following:    Sodium 134 (*)    Glucose, Bld 135 (*)    BUN 65 (*)    Creatinine, Ser 4.31 (*)    Albumin 3.2 (*)    AST 78 (*)    ALT 39 (*)    Total Bilirubin 2.3 (*)    GFR calc non Af Amer 9 (*)    GFR calc Af Amer 11 (*)    All other components within normal limits  CBC WITH DIFFERENTIAL - Abnormal;  Notable for the following:    RBC 3.65 (*)    Hemoglobin 11.5 (*)    RDW 15.8 (*)    Neutrophils Relative % 78 (*)    Neutro Abs 8.2 (*)    Lymphocytes Relative 9 (*)    All other components within normal limits  URINE CULTURE  STOOL CULTURE  CLOSTRIDIUM DIFFICILE BY PCR  MAGNESIUM  URINALYSIS, ROUTINE W REFLEX MICROSCOPIC  CYCLOSPORINE LEVEL   Component     Latest Ref Rng 09/05/2013 02/02/2014 04/27/2014            Sodium     135 - 145 mmol/L 140 139 134 (L)  Potassium     3.5 - 5.1 mmol/L 3.1 (L) 4.4 4.4  Chloride     96 - 112 mEq/L 105 106 108  CO2     19 - 32 mmol/L 25 24 19   Glucose     70 - 99 mg/dL 110 (H) 103 (H) 135 (H)  BUN     6 - 23 mg/dL 46 (H) 46 (H) 65 (H)  Creatinine     0.50 - 1.10 mg/dL 2.7 (H) 2.8 (H) 4.31 (H)  Calcium     8.4 - 10.5 mg/dL 8.9 8.7 8.8  Total Protein     6.0 - 8.3 g/dL 5.9 (L) 6.2 6.8  Albumin     3.5 - 5.2 g/dL 3.2 (L) 2.9 (L) 3.2 (L)  AST     0 - 37 U/L 19 19 78 (H)  ALT     0 - 35 U/L 16 15 39 (H)  Alkaline Phosphatase     39 - 117 U/L 39 64 54  Total Bilirubin     0.3 - 1.2 mg/dL 0.7 0.7 2.3 (H)  GFR calc non Af Amer     >90 mL/min   9 (L)  GFR calc Af Amer     >90 mL/min   11 (L)  Anion gap     5 - 15   7     Imaging Review Dg Chest 2 View  04/27/2014   CLINICAL DATA:  Fatigue, loss of appetite.  Chronic diarrhea.  EXAM: CHEST  2 VIEW  COMPARISON:  07/17/2013  FINDINGS: Scarring in the lingula. Right lung is clear. No effusions. Heart is normal size. No acute bony abnormality.  IMPRESSION: Lingular scarring.  No active disease.   Electronically Signed   By: Rolm Baptise M.D.   On: 04/27/2014 15:13     EKG Interpretation None      MDM   Final diagnoses:  Dehydration  AKI (acute kidney injury)  Hyperbilirubinemia  Diarrhea    Malaise, and dehydration, secondary to poor oral intake.  Significant worsening in renal function, with elevated bilirubin.  This is likely all secondary to dehydration, volume  depletion.  Patient is a moderately frail patient with condition of lung transplant, on immunosuppression medication.  Will need treatment and observation, to improve her status, and may need to have treatment for a diarrheal state.  Nursing Notes Reviewed/ Care Coordinated, and agree without changes. Applicable Imaging Reviewed.  Interpretation of Laboratory Data incorporated into ED treatment  Plan: Admit    Richarda Blade, MD 04/27/14 [phone]

## 2014-04-27 NOTE — ED Notes (Signed)
EDP asked to sign up for pt due to her time of being in the ED. EDP also made aware of the rectal temp of 96.4

## 2014-04-27 NOTE — Progress Notes (Signed)
Utilization Review completed.  Jahaan Vanwagner RN CM  

## 2014-04-27 NOTE — ED Notes (Signed)
Pt's contact:  Jaclyn Shaggy---- tel# [phone].

## 2014-04-27 NOTE — ED Notes (Signed)
Pt c/o weakness/fatigue, loss of appetite over the past week.  Pt is transplant pt and has chronic diarrhea, possibly due to medications per family member.

## 2014-04-28 LAB — COMPREHENSIVE METABOLIC PANEL
ALBUMIN: 2.7 g/dL — AB (ref 3.5–5.2)
ALK PHOS: 50 U/L (ref 39–117)
ALT: 42 U/L — AB (ref 0–35)
AST: 95 U/L — AB (ref 0–37)
Anion gap: 9 (ref 5–15)
BUN: 63 mg/dL — ABNORMAL HIGH (ref 6–23)
CALCIUM: 8.6 mg/dL (ref 8.4–10.5)
CO2: 18 mmol/L — ABNORMAL LOW (ref 19–32)
Chloride: 110 mEq/L (ref 96–112)
Creatinine, Ser: 4.02 mg/dL — ABNORMAL HIGH (ref 0.50–1.10)
GFR calc non Af Amer: 10 mL/min — ABNORMAL LOW (ref >90)
GFR, EST AFRICAN AMERICAN: 11 mL/min — AB (ref >90)
GLUCOSE: 88 mg/dL (ref 70–99)
POTASSIUM: 4.3 mmol/L (ref 3.5–5.1)
SODIUM: 137 mmol/L (ref 135–145)
Total Bilirubin: 1.7 mg/dL — ABNORMAL HIGH (ref 0.3–1.2)
Total Protein: 5.8 g/dL — ABNORMAL LOW (ref 6.0–8.3)

## 2014-04-28 LAB — TSH: TSH: 13.636 u[IU]/mL — ABNORMAL HIGH (ref 0.350–4.500)

## 2014-04-28 LAB — CBC
HEMATOCRIT: 31.8 % — AB (ref 36.0–46.0)
Hemoglobin: 10.1 g/dL — ABNORMAL LOW (ref 12.0–15.0)
MCH: 31.5 pg (ref 26.0–34.0)
MCHC: 31.8 g/dL (ref 30.0–36.0)
MCV: 99.1 fL (ref 78.0–100.0)
Platelets: 268 10*3/uL (ref 150–400)
RBC: 3.21 MIL/uL — ABNORMAL LOW (ref 3.87–5.11)
RDW: 15.8 % — ABNORMAL HIGH (ref 11.5–15.5)
WBC: 8.6 10*3/uL (ref 4.0–10.5)

## 2014-04-28 LAB — URINALYSIS, ROUTINE W REFLEX MICROSCOPIC
Bilirubin Urine: NEGATIVE
Glucose, UA: NEGATIVE mg/dL
Ketones, ur: NEGATIVE mg/dL
NITRITE: POSITIVE — AB
Protein, ur: 30 mg/dL — AB
SPECIFIC GRAVITY, URINE: 1.016 (ref 1.005–1.030)
UROBILINOGEN UA: 0.2 mg/dL (ref 0.0–1.0)
pH: 5 (ref 5.0–8.0)

## 2014-04-28 LAB — URINE CULTURE
COLONY COUNT: NO GROWTH
CULTURE: NO GROWTH

## 2014-04-28 LAB — URINE MICROSCOPIC-ADD ON

## 2014-04-28 LAB — CYCLOSPORINE LEVEL: CYCLOSPORINE: 39 ng/mL — AB (ref 140–330)

## 2014-04-28 LAB — HEMOGLOBIN A1C
HEMOGLOBIN A1C: 6.3 % — AB (ref <5.7)
MEAN PLASMA GLUCOSE: 134 mg/dL — AB (ref <117)

## 2014-04-28 MED ORDER — LEVOFLOXACIN IN D5W 500 MG/100ML IV SOLN
500.0000 mg | INTRAVENOUS | Status: DC
  Administered 2014-04-28: 500 mg via INTRAVENOUS
  Filled 2014-04-28 (×2): qty 100

## 2014-04-28 MED ORDER — ENOXAPARIN SODIUM 30 MG/0.3ML ~~LOC~~ SOLN
30.0000 mg | Freq: Every day | SUBCUTANEOUS | Status: DC
  Administered 2014-04-28 – 2014-04-30 (×3): 30 mg via SUBCUTANEOUS
  Filled 2014-04-28 (×4): qty 0.3

## 2014-04-28 NOTE — Progress Notes (Signed)
CSW received referral that pt admitted from [place].  Inappropriate CSW referral as pt from [place].  CSW to await PT/OT consult to determine if pt has any social work needs.   Alison Murray, MSW, [place] [facility] [phone]

## 2014-04-28 NOTE — Consult Note (Signed)
Reason for Consult: cholelithiasis/elevated LFT'S Referring Physician: DR. Mart Piggs  Nancy Blair is an 79 y.o. female.  HPI: Pt admitted with several weeks of diarrhea, progressive weakness, poor oral intake.  Her diarrhea has become so bad she cannot get to the BR without soiling herself.  She has apparently had the diarrhea issue for some years. She reports she will have episodes of diarrhea for 1-2 days, it will get better; then she will be fine for a week or more, then the diarrhea starts again.  She varies her answer on this but the best I can tell, food does not affect her diarrhea.  She has no nausea, vomiting or abdominal pain with loose stools.  She presents now with loose stools, for about a week.  No nausea, no vomiting, no abdominal pain.  Food neither seems to make her diarrhea better or worse.  She is on immunosuppression therapy for her lung transplant, as well as some antibiotic therapy for suppression.  She is followed by the [facility] transplant team.  She was seen in [place] 2015 and was found to be depressed depression also noted in 2013/09/25 visit, her husband had died 2 weeks prior to that visit.  Diarrhea was not mentioned in that office visit.  Work up here at [facility] shows she is afebrile, and VSS. Labs show a normal WBC, H/H is stable, LFT's are mildly elevated, compared to 01/2014.  Creatinine is up from 2.8 to 4.35 on admission. Urine culture is pending. CXR show no active disease.  Her abdominal US shows gallbladder sludge, with mild distension.  Non mobile stone or sludge ball, CBD is up to 11 mm. No GB wall thickening or pericholecystic fluid noted.  Again the WBC is normal.   We are ask to see and evaluate for possible cholelithiasis.   Past Medical History  Diagnosis Date  Emphysema of lung   Lung transplant status, bilateral   CKD (chronic kidney disease) stage 4, GFR 15-29 ml/min    since 2012 (per [facility] records)     Acute on chronic diastolic heart failure (grade 1  diastolic dysfunction)   Moderate to severe Aortic stenosis Valve area abotu .31 cm2/mild MR   Hypertension   Adrenal insufficiency   Anemia   Hyperlipidemia   Hypothyroidism   GERD (gastroesophageal reflux disease)   Squamous acanthoma of skin  rt leg   06/2011    rt leg  Depression          Past Surgical History  Procedure Laterality Date  . Appendectomy    . Abdominal hysterectomy    . Tonsillectomy    . Total lung replacement  2012    b/l lung transplant  . Esophagogastroduodenoscopy  05/26/2011    Procedure: ESOPHAGOGASTRODUODENOSCOPY (EGD);  Surgeon: Owens Loffler, MD;  Location: [facility];  Service: Endoscopy;  Laterality: N/A;  . Nissen fundoplication      Family History  Problem Relation Age of Onset  . Heart disease Mother   . Heart disease Father   . Pancreatic cancer Sister   . Colon cancer Neg Hx   . Malignant hyperthermia Neg Hx   . Hypertension Mother   . Hypertension Father     Social History:  reports that she quit smoking about 20 years ago. She started smoking about 62 years ago. She has never used smokeless tobacco. She reports that she does not drink alcohol or use illicit drugs.  Allergies:  Allergies  Allergen Reactions  . Grapefruit Extract Anaphylaxis  .  Oysters [Shellfish Allergy] Anaphylaxis  . Pineapple Anaphylaxis  . Bupropion Other (See Comments)    sleepy  . Neomycin-Bacitracin Zn-Polymyx Hives and Other (See Comments)    blisters  . Penicillins Swelling    Just face swelling  . Prednisone Other (See Comments)    crazy    Medications:  Prior to Admission:  Prescriptions prior to admission  Medication Sig Dispense Refill Last Dose  . aspirin 81 MG chewable tablet Chew 81 mg by mouth daily.   04/27/2014 at Unknown time  . carvedilol (COREG) 6.25 MG tablet Take 1 tablet (6.25 mg total) by mouth 2 (two) times daily with a meal. 60 tablet 1 04/27/2014 at 0930  . citalopram (CELEXA) 20 MG tablet Take 20 mg by mouth daily.    04/27/2014 at Unknown time  . cycloSPORINE modified (NEORAL) 25 MG capsule Take 75-100 mg by mouth 2 (two) times daily. 36m in the morning and 100 mg in the evening   04/27/2014 at 0930  . ferrous sulfate 325 (65 FE) MG tablet Take 325 mg by mouth daily with breakfast.   04/27/2014 at Unknown time  . furosemide (LASIX) 20 MG tablet Take 1 tablet (20 mg total) by mouth every other day. Takes on Monday, Wednesday, and Friday 90 tablet 0 04/27/2014 at Unknown time  . guaiFENesin (MUCINEX) 600 MG 12 hr tablet Take 600 mg by mouth 2 (two) times daily as needed for cough or to loosen phlegm.   04/27/2014 at 0930  . hydrocortisone (CORTEF) 20 MG tablet Take by mouth 2 (two) times daily.    04/27/2014 at 0930  . insulin regular (NOVOLIN R,HUMULIN R) 100 units/mL injection Inject 1-3 Units into the skin at bedtime as needed for high blood sugar. On sliding scale   unknown  . isosorbide mononitrate (IMDUR) 30 MG 24 hr tablet Take 0.5 tablets (15 mg total) by mouth daily. 90 tablet 0 04/27/2014 at Unknown time  . levothyroxine (SYNTHROID, LEVOTHROID) 88 MCG tablet Take 1 tablet (88 mcg total) by mouth daily before breakfast. 90 tablet 1 04/27/2014 at Unknown time  . Multiple Vitamin (MULITIVITAMIN WITH MINERALS) TABS Take 1 tablet by mouth daily.   04/27/2014 at Unknown time  . mycophenolate (CELLCEPT) 500 MG tablet Take 500 mg by mouth 2 (two) times daily.    04/27/2014 at 0930  . potassium chloride SA (K-DUR,KLOR-CON) 20 MEQ tablet Take 1 tablet (20 mEq total) by mouth daily. 30 tablet 6 04/27/2014 at Unknown time  . pravastatin (PRAVACHOL) 20 MG tablet TAKE 1 TABLET BY MOUTH EVERY NIGHT AT BEDTIME 30 tablet 4 Past Week at Unknown time  . sulfamethoxazole-trimethoprim (BACTRIM,SEPTRA) 400-80 MG per tablet Take 1 tablet by mouth every Monday, Wednesday, and Friday.    04/27/2014 at 0930  . valGANciclovir (VALCYTE) 450 MG tablet Take 450 mg by mouth every Monday, Wednesday, and Friday.    04/27/2014 at 0930  . zaleplon  (SONATA) 5 MG capsule Take 5 mg by mouth at bedtime.   Past Week at Unknown time  . citalopram (CELEXA) 40 MG tablet Take 1 tablet (40 mg total) by mouth daily. (Patient not taking: Reported on 04/27/2014) 30 tablet 3 Not Taking at Unknown time  . Potassium Chloride ER 20 MEQ TBCR TAKE 1 TABLET BY MOUTH ONCE DAILY (Patient not taking: Reported on 04/27/2014) 30 tablet 4 Not Taking at Unknown time   Scheduled: . aspirin  81 mg Oral Daily  . enoxaparin (LOVENOX) injection  30 mg Subcutaneous QHS  . ferrous sulfate  325 mg Oral Daily  . hydrocortisone  20 mg Oral BID WC  . isosorbide mononitrate  15 mg Oral Daily  . levofloxacin (LEVAQUIN) IV  500 mg Intravenous Q24H  . levothyroxine  88 mcg Oral QAC breakfast  . multivitamin with minerals  1 tablet Oral Daily  . potassium chloride SA  20 mEq Oral Daily  . pravastatin  20 mg Oral QHS  . [START ON 04/29/2014] sulfamethoxazole-trimethoprim  1 tablet Oral Q M,W,F  . [START ON 04/29/2014] valGANciclovir  450 mg Oral Q M,W,F   Continuous: . sodium chloride 1,000 mL (04/28/14 1137)   VOZ:[id number], morphine injection, ondansetron **OR** ondansetron (ZOFRAN) IV, oxyCODONE, zolpidem Anti-infectives    Start     Dose/Rate Route Frequency Ordered Stop   04/29/14 1000  sulfamethoxazole-trimethoprim (BACTRIM,SEPTRA) 400-80 MG per tablet 1 tablet     1 tablet Oral Every M-W-F 04/27/14 2136     04/29/14 1000  valGANciclovir (VALCYTE) 450 MG tablet TABS 450 mg     450 mg Oral Every M-W-F 04/27/14 2136     04/28/14 1600  levofloxacin (LEVAQUIN) IVPB 500 mg     500 mg100 mL/hr over 60 Minutes Intravenous Every 24 hours 04/28/14 1500        Results for orders placed or performed during the hospital encounter of 04/27/14 (from the past 48 hour(s))  Comprehensive metabolic panel     Status: Abnormal   Collection Time: 04/27/14  2:46 PM  Result Value Ref Range   Sodium 134 (L) 135 - 145 mmol/L    Comment: Please note change in reference range.    Potassium 4.4 3.5 - 5.1 mmol/L    Comment: Please note change in reference range.   Chloride 108 96 - 112 mEq/L   CO2 19 19 - 32 mmol/L   Glucose, Bld 135 (H) 70 - 99 mg/dL   BUN 65 (H) 6 - 23 mg/dL   Creatinine, Ser 4.31 (H) 0.50 - 1.10 mg/dL   Calcium 8.8 8.4 - 10.5 mg/dL   Total Protein 6.8 6.0 - 8.3 g/dL   Albumin 3.2 (L) 3.5 - 5.2 g/dL   AST 78 (H) 0 - 37 U/L   ALT 39 (H) 0 - 35 U/L   Alkaline Phosphatase 54 39 - 117 U/L   Total Bilirubin 2.3 (H) 0.3 - 1.2 mg/dL   GFR calc non Af Amer 9 (L) >90 mL/min   GFR calc Af Amer 11 (L) >90 mL/min    Comment: (NOTE) The eGFR has been calculated using the CKD EPI equation. This calculation has not been validated in all clinical situations. eGFR's persistently <90 mL/min signify possible Chronic Kidney Disease.    Anion gap 7 5 - 15  CBC with Differential     Status: Abnormal   Collection Time: 04/27/14  2:46 PM  Result Value Ref Range   WBC 10.5 4.0 - 10.5 K/uL   RBC 3.65 (L) 3.87 - 5.11 MIL/uL   Hemoglobin 11.5 (L) 12.0 - 15.0 g/dL   HCT 36.2 36.0 - 46.0 %   MCV 99.2 78.0 - 100.0 fL   MCH 31.5 26.0 - 34.0 pg   MCHC 31.8 30.0 - 36.0 g/dL   RDW 15.8 (H) 11.5 - 15.5 %   Platelets 293 150 - 400 K/uL   Neutrophils Relative % 78 (H) 43 - 77 %   Neutro Abs 8.2 (H) 1.7 - 7.7 K/uL   Lymphocytes Relative 9 (L) 12 - 46 %   Lymphs Abs 1.0 0.7 -  4.0 K/uL   Monocytes Relative 10 3 - 12 %   Monocytes Absolute 1.0 0.1 - 1.0 K/uL   Eosinophils Relative 2 0 - 5 %   Eosinophils Absolute 0.2 0.0 - 0.7 K/uL   Basophils Relative 1 0 - 1 %   Basophils Absolute 0.1 0.0 - 0.1 K/uL  Cyclosporine level     Status: Abnormal   Collection Time: 04/27/14  2:46 PM  Result Value Ref Range   Cyclosporine 39 (L) 140 - 330 ng/mL    Comment: Performed at [facility]  Magnesium     Status: None   Collection Time: 04/27/14  2:46 PM  Result Value Ref Range   Magnesium 2.5 1.5 - 2.5 mg/dL  Urinalysis, Routine w reflex microscopic     Status:  Abnormal   Collection Time: 04/27/14  4:51 PM  Result Value Ref Range   Color, Urine AMBER (A) YELLOW    Comment: BIOCHEMICALS MAY BE AFFECTED BY COLOR   APPearance TURBID (A) CLEAR   Specific Gravity, Urine 1.016 1.005 - 1.030   pH 5.0 5.0 - 8.0   Glucose, UA NEGATIVE NEGATIVE mg/dL   Hgb urine dipstick MODERATE (A) NEGATIVE   Bilirubin Urine SMALL (A) NEGATIVE   Ketones, ur NEGATIVE NEGATIVE mg/dL   Protein, ur 30 (A) NEGATIVE mg/dL   Urobilinogen, UA 0.2 0.0 - 1.0 mg/dL   Nitrite POSITIVE (A) NEGATIVE   Leukocytes, UA LARGE (A) NEGATIVE  Urine microscopic-add on     Status: None   Collection Time: 04/27/14  4:51 PM  Result Value Ref Range   Urine-Other FIELD OBSCURED BY WBC'S   Comprehensive metabolic panel     Status: Abnormal   Collection Time: 04/27/14 10:30 PM  Result Value Ref Range   Sodium 131 (L) 135 - 145 mmol/L    Comment: Please note change in reference range.   Potassium 3.8 3.5 - 5.1 mmol/L    Comment: Please note change in reference range.   Chloride 102 96 - 112 mEq/L   CO2 19 19 - 32 mmol/L   Glucose, Bld 116 (H) 70 - 99 mg/dL   BUN 65 (H) 6 - 23 mg/dL   Creatinine, Ser 4.35 (H) 0.50 - 1.10 mg/dL   Calcium 8.3 (L) 8.4 - 10.5 mg/dL   Total Protein 6.2 6.0 - 8.3 g/dL   Albumin 2.9 (L) 3.5 - 5.2 g/dL   AST 73 (H) 0 - 37 U/L   ALT 37 (H) 0 - 35 U/L   Alkaline Phosphatase 49 39 - 117 U/L   Total Bilirubin 2.0 (H) 0.3 - 1.2 mg/dL   GFR calc non Af Amer 9 (L) >90 mL/min   GFR calc Af Amer 10 (L) >90 mL/min    Comment: (NOTE) The eGFR has been calculated using the CKD EPI equation. This calculation has not been validated in all clinical situations. eGFR's persistently <90 mL/min signify possible Chronic Kidney Disease.    Anion gap 10 5 - 15  Magnesium     Status: None   Collection Time: 04/27/14 10:30 PM  Result Value Ref Range   Magnesium 2.4 1.5 - 2.5 mg/dL  Phosphorus     Status: Abnormal   Collection Time: 04/27/14 10:30 PM  Result Value Ref Range    Phosphorus 4.7 (H) 2.3 - 4.6 mg/dL  TSH     Status: Abnormal   Collection Time: 04/27/14 10:30 PM  Result Value Ref Range   TSH 13.636 (H) 0.350 - 4.500 uIU/mL  Comment: Performed at [facility]  Hemoglobin A1c     Status: Abnormal   Collection Time: 04/27/14 10:30 PM  Result Value Ref Range   Hgb A1c MFr Bld 6.3 (H) <5.7 %    Comment: (NOTE)                                                                       According to the ADA Clinical Practice Recommendations for 2011, when HbA1c is used as a screening test:  >=6.5%   Diagnostic of Diabetes Mellitus           (if abnormal result is confirmed) 5.7-6.4%   Increased risk of developing Diabetes Mellitus References:Diagnosis and Classification of Diabetes Mellitus,Diabetes UVOZ,3664,40(HKVQQ 1):S62-S69 and Standards of Medical Care in         Diabetes - 2011,Diabetes Care,2011,34 (Suppl 1):S11-S61.    Mean Plasma Glucose 134 (H) <117 mg/dL    Comment: Performed at [facility] metabolic panel     Status: Abnormal   Collection Time: 04/28/14  4:45 AM  Result Value Ref Range   Sodium 137 135 - 145 mmol/L    Comment: Please note change in reference range.   Potassium 4.3 3.5 - 5.1 mmol/L    Comment: Please note change in reference range.   Chloride 110 96 - 112 mEq/L   CO2 18 (L) 19 - 32 mmol/L   Glucose, Bld 88 70 - 99 mg/dL   BUN 63 (H) 6 - 23 mg/dL   Creatinine, Ser 4.02 (H) 0.50 - 1.10 mg/dL   Calcium 8.6 8.4 - 10.5 mg/dL   Total Protein 5.8 (L) 6.0 - 8.3 g/dL   Albumin 2.7 (L) 3.5 - 5.2 g/dL   AST 95 (H) 0 - 37 U/L   ALT 42 (H) 0 - 35 U/L   Alkaline Phosphatase 50 39 - 117 U/L   Total Bilirubin 1.7 (H) 0.3 - 1.2 mg/dL   GFR calc non Af Amer 10 (L) >90 mL/min   GFR calc Af Amer 11 (L) >90 mL/min    Comment: (NOTE) The eGFR has been calculated using the CKD EPI equation. This calculation has not been validated in all clinical situations. eGFR's persistently <90 mL/min signify  possible Chronic Kidney Disease.    Anion gap 9 5 - 15  CBC     Status: Abnormal   Collection Time: 04/28/14  4:45 AM  Result Value Ref Range   WBC 8.6 4.0 - 10.5 K/uL   RBC 3.21 (L) 3.87 - 5.11 MIL/uL   Hemoglobin 10.1 (L) 12.0 - 15.0 g/dL   HCT 31.8 (L) 36.0 - 46.0 %   MCV 99.1 78.0 - 100.0 fL   MCH 31.5 26.0 - 34.0 pg   MCHC 31.8 30.0 - 36.0 g/dL   RDW 15.8 (H) 11.5 - 15.5 %   Platelets 268 150 - 400 K/uL  Urinalysis, Routine w reflex microscopic     Status: Abnormal   Collection Time: 04/28/14 11:32 AM  Result Value Ref Range   Color, Urine YELLOW YELLOW   APPearance TURBID (A) CLEAR   Specific Gravity, Urine 1.016 1.005 - 1.030   pH 5.0 5.0 - 8.0   Glucose, UA NEGATIVE NEGATIVE mg/dL   Hgb  urine dipstick SMALL (A) NEGATIVE   Bilirubin Urine NEGATIVE NEGATIVE   Ketones, ur NEGATIVE NEGATIVE mg/dL   Protein, ur 30 (A) NEGATIVE mg/dL   Urobilinogen, UA 0.2 0.0 - 1.0 mg/dL   Nitrite POSITIVE (A) NEGATIVE   Leukocytes, UA LARGE (A) NEGATIVE  Urine microscopic-add on     Status: None   Collection Time: 04/28/14 11:32 AM  Result Value Ref Range   Urine-Other FIELD OBSCURED BY WBC'S     Dg Chest 2 View  04/27/2014   CLINICAL DATA:  Fatigue, loss of appetite.  Chronic diarrhea.  EXAM: CHEST  2 VIEW  COMPARISON:  07/17/2013  FINDINGS: Scarring in the lingula. Right lung is clear. No effusions. Heart is normal size. No acute bony abnormality.  IMPRESSION: Lingular scarring.  No active disease.   Electronically Signed   By: Rolm Baptise M.D.   On: 04/27/2014 15:13   US Abdomen Complete  04/27/2014   CLINICAL DATA:  Hyperbilirubinemia.  EXAM: ULTRASOUND ABDOMEN COMPLETE  COMPARISON:  Renal ultrasound 07/18/2013, no prior additional abdominal imaging.  FINDINGS: Gallbladder: Mildly distended. There is dependent sludge. There is an echogenic 6 mm focus that is nonmobile in the fundus, may reflect adhesive stone versus sludge ball. No gallstones or wall thickening visualized. No  sonographic Murphy sign noted.  Common bile duct: Diameter: 5 mm at the porta hepatis, flares in the midportion to 11 mm. The distal most common bowel duct is obscured by bowel gas. There is no intrahepatic biliary ductal dilatation.  Liver: No focal lesion identified. Hepatic parenchyma is heterogeneous. Normal directional flow in the main portal vein.  IVC: No abnormality visualized.  Pancreas: Not visualized due to overlying bowel gas.  Spleen: Size and appearance within normal limits.  Right Kidney: Length: 9.2 cm. Echogenicity within normal limits. No mass or hydronephrosis visualized.  Left Kidney: Length: 9.0 cm. Echogenicity within normal limits. No mass or hydronephrosis visualized.  Abdominal aorta: No aneurysm visualized. Atherosclerotic calcifications are noted.  Other findings: No ascites.  IMPRESSION: 1. Sludge filled gallbladder that is mildly distended. Non mobile echogenic 6 mm focus may reflect a sludge ball versus adhesive stone. No gallbladder wall thickening. No sonographic Murphy sign. 2. Apparent flaring of the mid common bowel duct to 11 mm, however normal to porta hepatis at 5 mm. No intrahepatic biliary ductal dilatation. Significance is uncertain. Correlation with any prior exams to evaluate for imaging stability would be most helpful. MRCP could be considered for further evaluation of the biliary tree, if the patient is able to cooperate with positioning and breath-holding technique. 3. Mildly heterogeneous hepatic parenchyma without focal lesion.   Electronically Signed   By: Jeb Levering M.D.   On: 04/27/2014 19:10    Review of Systems  Constitutional: Positive for weight loss and malaise/fatigue. Negative for fever, chills and diaphoresis.  HENT: Negative.   Eyes: Negative.   Respiratory: Positive for cough.   Cardiovascular: Negative.   Gastrointestinal: Positive for diarrhea. Negative for heartburn, nausea, vomiting, abdominal pain, constipation, blood in stool and  melena.  Genitourinary: Negative.   Musculoskeletal: Negative.   Skin: Negative.   Neurological: Positive for dizziness (orthostatic hypotension) and weakness. Negative for tingling, tremors, sensory change, speech change, focal weakness, seizures and loss of consciousness.  Endo/Heme/Allergies: Bruises/bleeds easily.  Psychiatric/Behavioral: Positive for depression (worse since her husbands death). The patient is nervous/anxious.    Blood pressure 146/62, pulse 86, temperature 98 F (36.7 C), temperature source Oral, resp. rate 18, height 5' (1.524 m), weight  61.236 kg (135 lb), SpO2 100 %. Physical Exam  Constitutional: She is oriented to person, place, and time. She appears well-developed. No distress.  HENT:  Head: Normocephalic and atraumatic.  Nose: Nose normal.  Eyes: Conjunctivae and EOM are normal. Pupils are equal, round, and reactive to light. Right eye exhibits no discharge. Left eye exhibits no discharge. No scleral icterus.  Neck: Normal range of motion. Neck supple. No JVD present. No tracheal deviation present. No thyromegaly present.  Cardiovascular: Normal rate, regular rhythm and intact distal pulses.   Murmur (3/6 SEM) heard. Respiratory: Effort normal and breath sounds normal. No respiratory distress. She has no wheezes. She has no rales. She exhibits no tenderness.  GI: Soft. Bowel sounds are normal. She exhibits no distension and no mass. There is no tenderness. There is no rebound and no guarding.  Musculoskeletal: She exhibits no edema or tenderness.  Lymphadenopathy:    She has no cervical adenopathy.  Neurological: She is alert and oriented to person, place, and time. No cranial nerve deficit.  Skin: Skin is warm and dry. No rash noted. She is not diaphoretic. No erythema. No pallor.  Lots of ecchymosis  Psychiatric: She has a normal mood and affect. Her behavior is normal. Judgment and thought content normal.    Assessment/Plan: 1.  cholelithiasis with mild  LFT elevations and some CBD dilatation 2.  Malaise and progressive weakness 3.  COPD with bilateral lung transplant 06/14/10/on chronic immunosuppression 4.  Aortic stenosis severe to moderate/mild MR 5.  Hx of fluid overload and diastolic dysfunction 6.  Hypertension 7.  Adrenal insufficiency 8.  Hyperlipidemia 9.  Hypothyroid 10.  Hx of anemia 11.  Anxiety and depression 12.  Malnutrition    Plan:  Pt has gallstones/sludge, and some CBD dilatation. LFT's are mildly elevated, but with her other co morbidities it's not very alarming.  Her main issue seems to be loose stools, dehydration, and renal insuffiencey.  She does not have abdominal pain, no nausea, no vomiting, and food does not seem to be a significant factor in when she has loose stools.  i will recheck her labs and review with Dr. Johney Maine and GI who will see her in the AM.  , 04/28/2014, 4:06 PM

## 2014-04-28 NOTE — Progress Notes (Signed)
No BM since arriving to floor; unable to collect stool sample Blair Hailey, RN

## 2014-04-28 NOTE — Progress Notes (Signed)
INITIAL NUTRITION ASSESSMENT  DOCUMENTATION CODES Per approved criteria  -Not Applicable   INTERVENTION: Recommend 2PM Nourishment.  NUTRITION DIAGNOSIS: Inadequate oral intake related to poor appetite and severe diarrhea as evidenced by less than 25% intake.   Goal: Pt to meet >90% of needs with meals and nourishments.  Monitor:  PO intake, GI profile, weight, labs  Reason for Assessment: MST = 3  79 y.o. female  Admitting Dx: <principal problem not specified>  ASSESSMENT: Pt admitted for severe weakness, poor oral intake, failure to thrive and persistent watery diarrhea.  Pt has COPD/emphysema, lung cancer and s/p bilateral lung transplant 4 years ago, currently on immunosuppressive therapy. Pts with poor PO intake for past week, likely related to loose stools.  Denied significant weight loss. Ptn states that diarrhea has stopped and she has consumed about 25% of meals.  Pt declined supplements but agreeable to afternoon snack.  C-diff results pending but loose stools have stopped so it is unlikely.   Nutrition Focused Physical Exam:  Subcutaneous Fat:  Orbital Region: well nourished Upper Arm Region: well nourished Thoracic and Lumbar Region: well nourished  Muscle:  Temple Region: well nourished Clavicle Bone Region: well nourished Clavicle and Acromion Bone Region: well nourished Scapular Bone Region: well nourished Dorsal Hand: well nourished Patellar Region: well nourished Anterior Thigh Region: well nourished Posterior Calf Region: well nourished  Edema: none    Height: Ht Readings from Last 1 Encounters:  04/27/14 5' (1.524 m)    Weight: Wt Readings from Last 1 Encounters:  04/27/14 135 lb (61.236 kg)    Ideal Body Weight: 100lbs   % Ideal Body Weight: 135%  Wt Readings from Last 10 Encounters:  04/27/14 135 lb (61.236 kg)  02/02/14 142 lb 6 oz (64.581 kg)  02/02/14 142 lb 6.4 oz (64.592 kg)  09/05/13 137 lb (62.143 kg)  08/13/13 138 lb 6 oz  (62.766 kg)  08/12/13 138 lb 6.4 oz (62.778 kg)  07/24/13 137 lb 2 oz (62.199 kg)  07/21/13 135 lb 9.6 oz (61.508 kg)  11/11/12 130 lb (58.968 kg)  09/02/12 130 lb 3.2 oz (59.058 kg)    Usual Body Weight: 135lbs  % Usual Body Weight: 100%  BMI:  Body mass index is 26.37 kg/(m^2).  Estimated Nutritional Needs: Kcal: 1500-1700 kcals/day Protein: 65-85 g pro/day Fluid: >/= 1800 mL/day  Skin: intact  Diet Order: Diet regular  No intake or output data in the 24 hours ending 04/28/14 1346  Last BM: 1/12  Labs:   Recent Labs Lab 04/27/14 1446 04/27/14 2230 04/28/14 0445  NA 134* 131* 137  K 4.4 3.8 4.3  CL 108 102 110  CO2 19 19 18*  BUN 65* 65* 63*  CREATININE 4.31* 4.35* 4.02*  CALCIUM 8.8 8.3* 8.6  MG 2.5 2.4  --   PHOS  --  4.7*  --   GLUCOSE 135* 116* 88    CBG (last 3)  No results for input(s): GLUCAP in the last 72 hours.  Scheduled Meds: . aspirin  81 mg Oral Daily  . enoxaparin (LOVENOX) injection  30 mg Subcutaneous QHS  . ferrous sulfate  325 mg Oral Daily  . hydrocortisone  20 mg Oral BID WC  . isosorbide mononitrate  15 mg Oral Daily  . levothyroxine  88 mcg Oral QAC breakfast  . multivitamin with minerals  1 tablet Oral Daily  . potassium chloride SA  20 mEq Oral Daily  . pravastatin  20 mg Oral QHS  . [START ON  04/29/2014] sulfamethoxazole-trimethoprim  1 tablet Oral Q M,W,F  . [START ON 04/29/2014] valGANciclovir  450 mg Oral Q M,W,F    Continuous Infusions: . sodium chloride 1,000 mL (04/28/14 1137)    Past Medical History  Diagnosis Date  . Emphysema of lung   . Hypertension   . Hyperlipidemia   . Allergy   . Anemia   . GERD (gastroesophageal reflux disease)   . Adrenal insufficiency   . Squamous acanthoma of skin 06/2011    rt leg  . Hypothyroidism   . CKD (chronic kidney disease) stage 4, GFR 15-29 ml/min     since 2012 (per [facility] records)  . Lung transplant status, bilateral   . Acute on chronic diastolic heart failure   .  Ejection fraction   . Diabetes mellitus without complication     Past Surgical History  Procedure Laterality Date  . Appendectomy    . Abdominal hysterectomy    . Tonsillectomy    . Total lung replacement  2012    b/l lung transplant  . Esophagogastroduodenoscopy  05/26/2011    Procedure: ESOPHAGOGASTRODUODENOSCOPY (EGD);  Surgeon: Owens Loffler, MD;  Location: [facility];  Service: Endoscopy;  Laterality: N/A;  . Nissen fundoplication      Elmer Picker MS Dietetic Intern Pager Number [phone]

## 2014-04-28 NOTE — Progress Notes (Signed)
Patient ID: Nancy Blair, female   DOB: 05/19/1935, 79 y.o.   MRN: [id number]  [facility] PROGRESS NOTE  JACKALYN HAITH [id number]:[id number] DOB: [birth year] DOA: 04/27/2014 PCP: Annye Asa, MD  Brief narrative: Pt is very pleasant 79 yo female with COPD/emhysema, lung caner and s/p bilateral lung transplant 4 years ago, currently on immunosuppressive therapy CellCept and cyclosporine, also on prophylactic antibiotic Bactrim, valcyte (antiviral), presented to [facility] emergency department with main concern of several weeks duration of progressively worsening weakness, poor oral intake, failure to thrive, persistent watery diarrhea. Patient explains she saw her primary care physician several months ago with similar complaints, no specific medications started. Patient's symptoms have gotten progressively worse and family at bedside reports that patient is unable to make it to the bathroom on time due to severity of diarrhea. She describes diarrhea as as watery, nonbloody, 4-7 episodes per day. Patient denies chest pain or shortness of breath, no specific abdominal pain, no vomiting, no fevers and chills, no recent sick contacts or exposures. In addition patient explains she is not taking any specific antibiotic other than the one for prophylaxis as noted above.  In emergency department, patient noted to be hemodynamically stable, vital signs stable, blood work notable for creatinine 4.3, bilirubin ~2. Tried hospitalist asked to admit for further evaluation and management.  Assessment and Plan:   Active Problems: Diarrhea  - With subsequent dehydration - no clear etiology, stool studies pending, cultures, O&P,  - continue IVF - Hold off on antidiarrheal medications until stool studies results back Hyperbilirubinemia with transaminitis  - ABD [place] with sludge filled gallbladder that is mildly distended, non mobile 6 mm focus ? sludge ball versus adhesive stone - surgery and GI teams  consulted for further assistance - repeat LFT's in AM Acute on chronic kidney disease stage 3-4 - Creatinine at baseline around 2, currently above baseline due to prerenal etiology but Cr is slowly trending down  - We'll provide IV fluids, will hold Lasix that patient takes at home  - Repeat BMP in the morning  - awaiting call back from pt's daughter to call transplant specialist in [facility]  UTI - UA suggestive of UTI but pt with no specific urinary concerns - she has immunocompromised state - will place on empiric Levaquin for now and follow up urine culture  Immunocompromise state  - Hold off on immunosuppressive therapy for now until renal function stabilizes and patient more hydrated  - Continue hydrocortisone tablet  - awaiting call from daughter to let me know the name of the doctor at [facility] (transplant specialist) to discuss medications  Status post bilateral lung transplant  - Currently does not require oxygen, respiratory status stable  Moderate malnutrition  - In the setting of fairly chronic illness, diarrhea and poor oral intake  - pt reports feeling well this AM and wants to eat Hypothyroidism - continue synthroid  Anemia of chronic disease - immunosuppression, CKD stage II - III  - slight drop in Hg likely dilutional from IVF - no signs of bleeding - repeat CBC in AM  DVT prophylaxis  Lovenox SQ while pt is in hospital  Code Status: Full Family Communication: Pt at bedside, daughters over the phone  Disposition Plan: Home when medically stable  IV access:   Peripheral IV Procedures and diagnostic studies;  Dg Chest 2 View  04/27/2014  Lingular scarring.  No active disease.     US Abdomen Complete  04/27/2014   Sludge filled gallbladder that is  mildly distended. Non mobile echogenic 6 mm focus may reflect a sludge ball versus adhesive stone. No gallbladder wall thickening. No sonographic Murphy sign. Apparent flaring of the mid common bowel duct to 11 mm, however  normal to porta hepatis at 5 mm. No intrahepatic biliary ductal dilatation. Significance is uncertain. Correlation with any prior exams to evaluate for imaging stability would be most helpful. MRCP could be considered for further evaluation of the biliary tree, if the patient is able to cooperate with positioning and breath-holding technique. Mildly heterogeneous hepatic parenchyma without focal lesion.      Medical consultants:   Surgery GI Pulmonologist at [facility] - awaiting the name of the doctor so I can call appropriate doctor  Other consultants:   None Anti-infectives:   Levaquin 1/12 -->  Faye Ramsay, MD  [facility] Pager [phone]  If 7PM-7AM, please contact night-coverage [email or web address] Password [facility] 04/28/2014, 3:57 PM   LOS: 1 day   HPI/Subjective: No events overnight.   Objective: Filed Vitals:   04/27/14 2043 04/27/14 2200 04/28/14 0600 04/28/14 1447  BP: 146/72 132/55 140/70 146/62  Pulse: 88 75 80 86  Temp: 97.8 F (36.6 C) 97.9 F (36.6 C) 97.8 F (36.6 C) 98 F (36.7 C)  TempSrc: Oral Oral Oral Oral  Resp: 18 16 16 18   Height:  5' (1.524 m)    Weight:  61.236 kg (135 lb)    SpO2: 98% 99% 99% 100%    Intake/Output Summary (Last 24 hours) at 04/28/14 1557 Last data filed at 04/28/14 1514  Gross per 24 hour  Intake    535 ml  Output    100 ml  Net    435 ml    Exam:   General:  Pt is alert, follows commands appropriately, not in acute distress  Cardiovascular: Regular rate and rhythm, S1/S2, no murmurs, no rubs, no gallops  Respiratory: Clear to auscultation bilaterally, no wheezing, no crackles, no rhonchi  Abdomen: Soft, non tender, non distended, bowel sounds present, no guarding  Data Reviewed: Basic Metabolic Panel:  Recent Labs Lab 04/27/14 1446 04/27/14 2230 04/28/14 0445  NA 134* 131* 137  K 4.4 3.8 4.3  CL 108 102 110  CO2 19 19 18*  GLUCOSE 135* 116* 88  BUN 65* 65* 63*  CREATININE 4.31* 4.35* 4.02*  CALCIUM 8.8 8.3* 8.6   MG 2.5 2.4  --   PHOS  --  4.7*  --    Liver Function Tests:  Recent Labs Lab 04/27/14 1446 04/27/14 2230 04/28/14 0445  AST 78* 73* 95*  ALT 39* 37* 42*  ALKPHOS 54 49 50  BILITOT 2.3* 2.0* 1.7*  PROT 6.8 6.2 5.8*  ALBUMIN 3.2* 2.9* 2.7*   CBC:  Recent Labs Lab 04/27/14 1446 04/28/14 0445  WBC 10.5 8.6  NEUTROABS 8.2*  --   HGB 11.5* 10.1*  HCT 36.2 31.8*  MCV 99.2 99.1  PLT 293 268   Scheduled Meds: . aspirin  81 mg Oral Daily  . enoxaparin (LOVENOX) injection  30 mg Subcutaneous QHS  . ferrous sulfate  325 mg Oral Daily  . hydrocortisone  20 mg Oral BID WC  . isosorbide mononitrate  15 mg Oral Daily  . levofloxacin (LEVAQUIN) IV  500 mg Intravenous Q24H  . levothyroxine  88 mcg Oral QAC breakfast  . multivitamin with minerals  1 tablet Oral Daily  . potassium chloride SA  20 mEq Oral Daily  . pravastatin  20 mg Oral QHS  . [START ON 04/29/2014] sulfamethoxazole-trimethoprim  1 tablet Oral Q M,W,F  . [START ON 04/29/2014] valGANciclovir  450 mg Oral Q M,W,F   Continuous Infusions: . sodium chloride 1,000 mL (04/28/14 1137)

## 2014-04-28 NOTE — Progress Notes (Signed)
Patient's daughter Lyda Kalata. called and told this Probation officer that she received an email from [facility],notified me that Dr. Doyle Askew needs to contact the transplant pulmonologist # is [phone]. This  matter has been text paged to Dr.Myers.Sandie Ano RN

## 2014-04-28 NOTE — Progress Notes (Signed)
Dr. Doyle Askew notified re: results of urinalysis,new order received.Sandie Ano RN

## 2014-04-29 ENCOUNTER — Inpatient Hospital Stay (HOSPITAL_COMMUNITY): Payer: Medicare Other

## 2014-04-29 DIAGNOSIS — R740 Nonspecific elevation of levels of transaminase and lactic acid dehydrogenase [LDH]: Secondary | ICD-10-CM

## 2014-04-29 DIAGNOSIS — K8020 Calculus of gallbladder without cholecystitis without obstruction: Secondary | ICD-10-CM

## 2014-04-29 DIAGNOSIS — N184 Chronic kidney disease, stage 4 (severe): Secondary | ICD-10-CM

## 2014-04-29 DIAGNOSIS — R7401 Elevation of levels of liver transaminase levels: Secondary | ICD-10-CM

## 2014-04-29 DIAGNOSIS — R932 Abnormal findings on diagnostic imaging of liver and biliary tract: Secondary | ICD-10-CM

## 2014-04-29 DIAGNOSIS — R197 Diarrhea, unspecified: Secondary | ICD-10-CM

## 2014-04-29 LAB — CBC
HEMATOCRIT: 27.8 % — AB (ref 36.0–46.0)
HEMOGLOBIN: 9.2 g/dL — AB (ref 12.0–15.0)
MCH: 32.5 pg (ref 26.0–34.0)
MCHC: 33.1 g/dL (ref 30.0–36.0)
MCV: 98.2 fL (ref 78.0–100.0)
Platelets: 275 10*3/uL (ref 150–400)
RBC: 2.83 MIL/uL — ABNORMAL LOW (ref 3.87–5.11)
RDW: 15.4 % (ref 11.5–15.5)
WBC: 8.3 10*3/uL (ref 4.0–10.5)

## 2014-04-29 LAB — COMPREHENSIVE METABOLIC PANEL
ALK PHOS: 75 U/L (ref 39–117)
ALT: 45 U/L — AB (ref 0–35)
ANION GAP: 10 (ref 5–15)
AST: 69 U/L — ABNORMAL HIGH (ref 0–37)
Albumin: 2.6 g/dL — ABNORMAL LOW (ref 3.5–5.2)
BUN: 48 mg/dL — ABNORMAL HIGH (ref 6–23)
CO2: 15 mmol/L — ABNORMAL LOW (ref 19–32)
Calcium: 8 mg/dL — ABNORMAL LOW (ref 8.4–10.5)
Chloride: 110 mEq/L (ref 96–112)
Creatinine, Ser: 3.13 mg/dL — ABNORMAL HIGH (ref 0.50–1.10)
GFR calc Af Amer: 16 mL/min — ABNORMAL LOW (ref >90)
GFR calc non Af Amer: 13 mL/min — ABNORMAL LOW (ref >90)
Glucose, Bld: 134 mg/dL — ABNORMAL HIGH (ref 70–99)
POTASSIUM: 3.9 mmol/L (ref 3.5–5.1)
SODIUM: 135 mmol/L (ref 135–145)
TOTAL PROTEIN: 5.6 g/dL — AB (ref 6.0–8.3)
Total Bilirubin: 1.4 mg/dL — ABNORMAL HIGH (ref 0.3–1.2)

## 2014-04-29 LAB — OVA AND PARASITE EXAMINATION

## 2014-04-29 LAB — LIPASE, BLOOD: Lipase: 30 U/L (ref 11–59)

## 2014-04-29 LAB — CLOSTRIDIUM DIFFICILE BY PCR: Toxigenic C. Difficile by PCR: NEGATIVE

## 2014-04-29 MED ORDER — LEVOFLOXACIN IN D5W 250 MG/50ML IV SOLN
250.0000 mg | INTRAVENOUS | Status: DC
  Filled 2014-04-29: qty 50

## 2014-04-29 MED ORDER — LEVOFLOXACIN IN D5W 250 MG/50ML IV SOLN
250.0000 mg | INTRAVENOUS | Status: DC
  Administered 2014-04-30: 250 mg via INTRAVENOUS
  Filled 2014-04-29: qty 50

## 2014-04-29 NOTE — Progress Notes (Signed)
Patient's daughter called backr,updates re: patient;s condition shared to daughter,also mentioned to her that her mother is scheduled  For MRCP at 74. Patient is NPO since 12 noon. Sandie Ano RN

## 2014-04-29 NOTE — Progress Notes (Signed)
CSW reviewed chart and noted that PT recommending home health PT.   Pt from [place].   RNCM to follow for home health needs.  No social work needs identified.   CSW signing off.  Please re-consult if social work needs arise.   Alison Murray, MSW, [place] [facility] [phone]

## 2014-04-29 NOTE — Progress Notes (Signed)
Patient ID: Nancy Blair, female   DOB: 1935-12-08, 79 y.o.   MRN: [id number]  [facility] PROGRESS NOTE  Nancy Blair YKD:[id number] DOB: February 18, 1936 DOA: 04/27/2014 PCP: Annye Asa, MD  Brief narrative: Pt is very pleasant 79 yo female with COPD/emhysema, lung caner and s/p bilateral lung transplant 4 years ago, currently on immunosuppressive therapy CellCept and cyclosporine, also on prophylactic antibiotic Bactrim, valcyte (antiviral), presented to [facility] emergency department with main concern of several weeks duration of progressively worsening weakness, poor oral intake, failure to thrive, persistent watery diarrhea. Patient explains she saw her primary care physician several months ago with similar complaints, no specific medications started. Patient's symptoms have gotten progressively worse and family at bedside reports that patient is unable to make it to the bathroom on time due to severity of diarrhea. She describes diarrhea as as watery, nonbloody, 4-7 episodes per day. Patient denies chest pain or shortness of breath, no specific abdominal pain, no vomiting, no fevers and chills, no recent sick contacts or exposures. In addition patient explains she is not taking any specific antibiotic other than the one for prophylaxis as noted above.  In emergency department, patient noted to be hemodynamically stable, vital signs stable, blood work notable for creatinine 4.3, bilirubin ~2. Tried hospitalist asked to admit for further evaluation and management.  Assessment and Plan:   Active Problems: Diarrhea  - With subsequent dehydration - C. Diff negative, GI stool panel pending  - continue IVF Hyperbilirubinemia with transaminitis  - ABD [place] with sludge filled gallbladder that is mildly distended, non mobile 6 mm focus ? sludge ball versus adhesive stone - surgery and GI teams consulted for further assistance - LFT's trending down  Acute on chronic kidney disease stage  3-4 - Creatinine at baseline around 2, currently above baseline due to prerenal etiology but Cr is trending down  - continue  IV fluids, hold Lasix that patient takes at home  - Repeat BMP in the morning  - awaiting call back from pt's daughter to call transplant specialist in [facility]  UTI - UA suggestive of UTI but pt with no specific urinary concerns - she has immunocompromised state - continue Levaquin day #2  Immunocompromise state  - Hold off on immunosuppressive therapy for now until renal function stabilizes and patient more hydrated  - Continue hydrocortisone tablet  - awaiting call from daughter to let me know the name of the doctor at [facility] (transplant specialist) to discuss medications  Status post bilateral lung transplant  - Currently does not require oxygen, respiratory status stable  Moderate malnutrition  - In the setting of fairly chronic illness, diarrhea and poor oral intake  - pt reports feeling well this AM and wants to eat Hypothyroidism - continue synthroid  Anemia of chronic disease - immunosuppression, CKD stage II - III  - slight drop in Hg likely dilutional from IVF - no signs of bleeding - repeat CBC in AM  DVT prophylaxis  Lovenox SQ while pt is in hospital  Code Status: Full Family Communication: Pt at bedside, daughters over the phone  Disposition Plan: Home when medically stable  IV access:   Peripheral IV Procedures and diagnostic studies;  Dg Chest 2 View 04/27/2014 Lingular scarring. No active disease.   US Abdomen Complete 04/27/2014 Sludge filled gallbladder that is mildly distended. Non mobile echogenic 6 mm focus may reflect a sludge ball versus adhesive stone. No gallbladder wall thickening. No sonographic Murphy sign. Apparent flaring of the mid common  bowel duct to 11 mm, however normal to porta hepatis at 5 mm. No intrahepatic biliary ductal dilatation. Significance is uncertain. Correlation with any prior exams to  evaluate for imaging stability would be most helpful. MRCP could be considered for further evaluation of the biliary tree, if the patient is able to cooperate with positioning and breath-holding technique. Mildly heterogeneous hepatic parenchyma without focal lesion.  Medical consultants:   Surgery GI Pulmonologist at [facility] - awaiting the name of the doctor so I can call appropriate doctor  Other consultants:   None Anti-infectives:   Levaquin 1/12 -->  Faye Ramsay, MD  [facility] Pager [phone]  If 7PM-7AM, please contact night-coverage [email or web address] Password [facility] 04/29/2014, 10:48 AM   LOS: 2 days   HPI/Subjective: No events overnight.   Objective: Filed Vitals:   04/28/14 0600 04/28/14 1447 04/28/14 2131 04/29/14 0700  BP: 140/70 146/62 149/78 143/72  Pulse: 80 86 83 85  Temp: 97.8 F (36.6 C) 98 F (36.7 C) 98.2 F (36.8 C) 98.2 F (36.8 C)  TempSrc: Oral Oral Oral Oral  Resp: 16 18 18 18   Height:      Weight:    62.007 kg (136 lb 11.2 oz)  SpO2: 99% 100% 100% 98%    Intake/Output Summary (Last 24 hours) at 04/29/14 1048 Last data filed at 04/29/14 0636  Gross per 24 hour  Intake 1733.75 ml  Output    300 ml  Net 1433.75 ml    Exam:   General:  Pt is alert, follows commands appropriately, not in acute distress  Cardiovascular: Regular rate and rhythm, S1/S2, no murmurs, no rubs, no gallops  Respiratory: Clear to auscultation bilaterally, no wheezing, no crackles, no rhonchi  Abdomen: Soft, non tender, non distended, bowel sounds present, no guarding  Data Reviewed: Basic Metabolic Panel:  Recent Labs Lab 04/27/14 1446 04/27/14 2230 04/28/14 0445 04/29/14 0523  NA 134* 131* 137 135  K 4.4 3.8 4.3 3.9  CL 108 102 110 110  CO2 19 19 18* 15*  GLUCOSE 135* 116* 88 134*  BUN 65* 65* 63* 48*  CREATININE 4.31* 4.35* 4.02* 3.13*  CALCIUM 8.8 8.3* 8.6 8.0*  MG 2.5 2.4  --   --   PHOS  --  4.7*  --   --    Liver Function Tests:  Recent  Labs Lab 04/27/14 1446 04/27/14 2230 04/28/14 0445 04/29/14 0523  AST 78* 73* 95* 69*  ALT 39* 37* 42* 45*  ALKPHOS 54 49 50 75  BILITOT 2.3* 2.0* 1.7* 1.4*  PROT 6.8 6.2 5.8* 5.6*  ALBUMIN 3.2* 2.9* 2.7* 2.6*   CBC:  Recent Labs Lab 04/27/14 1446 04/28/14 0445 04/29/14 0523  WBC 10.5 8.6 8.3  NEUTROABS 8.2*  --   --   HGB 11.5* 10.1* 9.2*  HCT 36.2 31.8* 27.8*  MCV 99.2 99.1 98.2  PLT 293 268 275     Recent Results (from the past 240 hour(s))  Urine culture     Status: None   Collection Time: 04/27/14  4:55 PM  Result Value Ref Range Status   Specimen Description URINE, CLEAN CATCH  Final   Special Requests Immunocompromised  Final   [place] Count NO GROWTH Performed at [facility]   Final   Culture NO GROWTH Performed at [facility]   Final   Report Status 04/28/2014 FINAL  Final  Clostridium Difficile by PCR     Status: None   Collection Time: 04/28/14  8:05 PM  Result Value  Ref Range Status   C difficile by pcr NEGATIVE NEGATIVE Final    Comment: Performed at [facility]     Scheduled Meds: . aspirin  81 mg Oral Daily  . enoxaparin (LOVENOX) injection  30 mg Subcutaneous QHS  . ferrous sulfate  325 mg Oral Daily  . hydrocortisone  20 mg Oral BID WC  . isosorbide mononitrate  15 mg Oral Daily  . levofloxacin (LEVAQUIN) IV  250 mg Intravenous Q24H  . levothyroxine  88 mcg Oral QAC breakfast  . multivitamin with minerals  1 tablet Oral Daily  . potassium chloride SA  20 mEq Oral Daily  . pravastatin  20 mg Oral QHS  . sulfamethoxazole-trimethoprim  1 tablet Oral Q M,W,F  . valGANciclovir  450 mg Oral Q M,W,F   Continuous Infusions: . sodium chloride 75 mL/hr at 04/29/14 0150

## 2014-04-29 NOTE — Evaluation (Signed)
Physical Therapy Evaluation Patient Details Name: Nancy Blair MRN: [id number] DOB: Feb 11, 1936 Today's Date: 04/29/2014   History of Present Illness  Pt is very pleasant 79 yo female with COPD/emhysema, lung caner and s/p bilateral lung transplant 4 years ago, currently on immunosuppressive therapy CellCept and cyclosporine, also on prophylactic antibiotic Bactrim, valcyte (antiviral), presented to [facility] emergency department with main concern of several weeks duration of progressively worsening weakness, poor oral intake, failure to thrive, persistent watery diarrhea. Patient explains she saw her primary care physician several months ago with similar complaints, no specific medications started. Patient's symptoms have gotten progressively worse and family at bedside reports that patient is unable to make it to the bathroom on time due to severity of diarrhea. She describes diarrhea as as watery, nonbloody, 4-7 episodes per day.   Clinical Impression  Pt admitted with above diagnosis. Pt currently with functional limitations due to the deficits listed below (see PT Problem List).  Pt will benefit from skilled PT to increase their independence and safety with mobility to allow discharge to the venue listed below.  Pt limited by fatigue, but able to ambulate 69' without RW and occasional use of rail with no LOB.  Pt demonstrating decreased safety with IV, as she was trying to get up to bathroom due to diarrhea, as IV still plugged in and bed alarm going off.       Follow Up Recommendations Home health PT    Equipment Recommendations  None recommended by PT    Recommendations for Other Services       Precautions / Restrictions Precautions Precautions: Fall      Mobility  Bed Mobility Overal bed mobility: Modified Independent             General bed mobility comments: pt up on EOB upon arrival with bed alarm going off and trying to get up to go to the BR due to  diarrhea  Transfers Overall transfer level: Needs assistance   Transfers: Sit to/from Stand Sit to Stand: Supervision         General transfer comment: stood from bed and toilet.  decreased safety from bed sue to IV still plugged in.  Ambulation/Gait Ambulation/Gait assistance: Min guard Ambulation Distance (Feet): 70 Feet Assistive device: None (occasional use of rail) Gait Pattern/deviations: Decreased step length - right;Decreased step length - left Gait velocity: decreased   General Gait Details: Pt ambulated without AD, but towards end of gait as she fatigued she used rail.  no LOB noted.  Stairs            Wheelchair Mobility    Modified Rankin (Stroke Patients Only)       Balance Overall balance assessment: Needs assistance   Sitting balance-Leahy Scale: Good     Standing balance support: No upper extremity supported Standing balance-Leahy Scale: Fair                               Pertinent Vitals/Pain Pain Assessment: No/denies pain    Home Living Family/patient expects to be discharged to:: Assisted living Living Arrangements: Alone             Home Equipment: None Additional Comments: Pt reports that she does not have nursing care, but does have all meals and housework done for her at [employer].      Prior Function Level of Independence: Independent         Comments: Plays "a  lot of Bingo"     Hand Dominance        Extremity/Trunk Assessment   Upper Extremity Assessment: Defer to OT evaluation           Lower Extremity Assessment: RLE deficits/detail;LLE deficits/detail RLE Deficits / Details: hip flex 4-/5 LLE Deficits / Details: hip flex 4-/5  Cervical / Trunk Assessment: Normal  Communication   Communication: No difficulties  Cognition Arousal/Alertness: Awake/alert Behavior During Therapy: WFL for tasks assessed/performed Overall Cognitive Status: Within Functional Limits for tasks assessed                       General Comments General comments (skin integrity, edema, etc.): Pt surprised at how fatigued she was.  No recliner in room.  Pt encouraged to sit up EOB which she reports she has been doing.    Exercises        Assessment/Plan    PT Assessment Patient needs continued PT services  PT Diagnosis Difficulty walking   PT Problem List Decreased activity tolerance;Decreased balance;Decreased mobility;Decreased strength  PT Treatment Interventions Gait training;Functional mobility training;Therapeutic activities;Therapeutic exercise   PT Goals (Current goals can be found in the Care Plan section) Acute Rehab PT Goals Patient Stated Goal: Return to [facility] PT Goal Formulation: With patient Time For Goal Achievement: 05/13/14 Potential to Achieve Goals: Good    Frequency Min 3X/week   Barriers to discharge        Co-evaluation               End of Session Equipment Utilized During Treatment: Gait belt Activity Tolerance: Patient limited by fatigue Patient left: in bed Nurse Communication: Mobility status         Time: 3557-3220 PT Time Calculation (min) (ACUTE ONLY): 19 min   Charges:   PT Evaluation $Initial PT Evaluation Tier I: 1 Procedure PT Treatments $Gait Training: 8-22 mins   PT G Codes:        Ayvin Lipinski LUBECK 04/29/2014, 9:29 AM

## 2014-04-29 NOTE — Evaluation (Signed)
Occupational Therapy Evaluation Patient Details Name: Nancy Blair MRN: [id number] DOB: 04-Jul-1935 Today's Date: 04/29/2014    History of Present Illness Pt is very pleasant 79 yo female with COPD/emhysema, lung caner and s/p bilateral lung transplant 4 years ago, currently on immunosuppressive therapy CellCept and cyclosporine, also on prophylactic antibiotic Bactrim, valcyte (antiviral), presented to [facility] emergency department with main concern of several weeks duration of progressively worsening weakness, poor oral intake, failure to thrive, persistent watery diarrhea. Patient explains she saw her primary care physician several months ago with similar complaints, no specific medications started. Patient's symptoms have gotten progressively worse and family at bedside reports that patient is unable to make it to the bathroom on time due to severity of diarrhea. She describes diarrhea as as watery, nonbloody, 4-7 episodes per day.    Clinical Impression   Pt is moving well and overall is supervision to min guard assist level with ADL. Will follow while on acute to progress ADL independence.    Follow Up Recommendations  No OT follow up    Equipment Recommendations  None recommended by OT    Recommendations for Other Services       Precautions / Restrictions Precautions Precautions: Fall      Mobility Bed Mobility Overal bed mobility: Modified Independent             General bed mobility comments: pt up on EOB upon arrival with bed alarm going off and trying to get up to go to the BR due to diarrhea  Transfers Overall transfer level: Needs assistance   Transfers: Sit to/from Stand Sit to Stand: Supervision         General transfer comment: stood from bed and toilet.  decreased safety from bed sue to IV still plugged in.    Balance Overall balance assessment: Needs assistance   Sitting balance-Leahy Scale: Good                                    ADL Overall ADL's : Needs assistance/impaired Eating/Feeding: Independent;Bed level   Grooming: Oral care;Min guard;Standing   Upper Body Bathing: Set up;Sitting   Lower Body Bathing: Min guard;Sit to/from stand   Upper Body Dressing : Set up;Sitting   Lower Body Dressing: Min guard;Sit to/from stand   Toilet Transfer: Min guard;Ambulation;Comfort height toilet   Toileting- Clothing Manipulation and Hygiene: Min guard;Sit to/from stand         General ADL Comments: Pt doing well. States she does own personal care at [facility]. She has a walk in shower and doesnt have a seat. She has regular toilets and she did well with standing from comfort height commode without use of bar. Discused need to go slowly for safety and pace self if she gets tired.      Vision                     Perception     Praxis      Pertinent Vitals/Pain Pain Assessment: No/denies pain     Hand Dominance     Extremity/Trunk Assessment Upper Extremity Assessment Upper Extremity Assessment: Overall WFL for tasks assessed      Cervical / Trunk Assessment Cervical / Trunk Assessment: Normal   Communication Communication Communication: No difficulties   Cognition Arousal/Alertness: Awake/alert Behavior During Therapy: WFL for tasks assessed/performed Overall Cognitive Status: Within Functional Limits for tasks assessed  General Comments       Exercises       Shoulder Instructions      Home Living Family/patient expects to be discharged to:: Assisted living Living Arrangements: Alone                 Bathroom Shower/Tub: Walk-in shower         Home Equipment: None   Additional Comments: Pt reports that she does not have nursing care, but does have all meals and housework done for her at [employer].        Prior Functioning/Environment Level of Independence: Independent        Comments: Plays "a lot of Bingo"    OT  Diagnosis: Generalized weakness   OT Problem List: Decreased strength;Decreased knowledge of use of DME or AE   OT Treatment/Interventions: Self-care/ADL training;Patient/family education;Therapeutic activities;DME and/or AE instruction    OT Goals(Current goals can be found in the care plan section) Acute Rehab OT Goals Patient Stated Goal: Return to [facility] OT Goal Formulation: With patient Time For Goal Achievement: 05/13/14 Potential to Achieve Goals: Good  OT Frequency: Min 2X/week   Barriers to D/C:            Co-evaluation              End of Session    Activity Tolerance: Patient tolerated treatment well Patient left: in bed;with call bell/phone within reach;with bed alarm set   Time: 2694-8546 OT Time Calculation (min): 11 min Charges:  OT General Charges $OT Visit: 1 Procedure OT Evaluation $Initial OT Evaluation Tier I: 1 Procedure G-Codes:    Jules Schick  [phone] 04/29/2014, 10:30 AM

## 2014-04-29 NOTE — Plan of Care (Signed)
Problem: Phase I Progression Outcomes Goal: OOB as tolerated unless otherwise ordered Outcome: Progressing Patient requires standy assist with transfers and with ADL's.

## 2014-04-29 NOTE — Progress Notes (Signed)
Subjective: 1 episode of diarrhea and tolerating regular diet.  No pain.  Objective: Vital signs in last 24 hours: Temp:  [98 F (36.7 C)-98.2 F (36.8 C)] 98.2 F (36.8 C) (01/13 0700) Pulse Rate:  [83-86] 85 (01/13 0700) Resp:  [18] 18 (01/13 0700) BP: (143-149)/(62-78) 143/72 mmHg (01/13 0700) SpO2:  [98 %-100 %] 98 % (01/13 0700) Weight:  [62.007 kg (136 lb 11.2 oz)] 62.007 kg (136 lb 11.2 oz) (01/13 0700) Last BM Date: 04/28/14 1 stool recorded Regular diet Afebrile, VSS  Recent Labs Lab 04/27/14 1446 04/27/14 2230 04/28/14 0445 04/29/14 0523  AST 78* 73* 95* 69*  ALT 39* 37* 42* 45*  ALKPHOS 54 49 50 75  BILITOT 2.3* 2.0* 1.7* 1.4*  PROT 6.8 6.2 5.8* 5.6*  ALBUMIN 3.2* 2.9* 2.7* 2.6*   Intake/Output from previous day: 01/12 0701 - 01/13 0700 In: 1733.8 [I.V.:1733.8] Out: 300 [Urine:300] Intake/Output this shift:    General appearance: alert, cooperative and no distress GI: soft, non-tender; bowel sounds normal; no masses,  no organomegaly  Lab Results:   Recent Labs  04/28/14 0445 04/29/14 0523  WBC 8.6 8.3  HGB 10.1* 9.2*  HCT 31.8* 27.8*  PLT 268 275    BMET  Recent Labs  04/28/14 0445 04/29/14 0523  NA 137 135  K 4.3 3.9  CL 110 110  CO2 18* 15*  GLUCOSE 88 134*  BUN 63* 48*  CREATININE 4.02* 3.13*  CALCIUM 8.6 8.0*   PT/INR No results for input(s): LABPROT, INR in the last 72 hours.   Recent Labs Lab 04/27/14 1446 04/27/14 2230 04/28/14 0445 04/29/14 0523  AST 78* 73* 95* 69*  ALT 39* 37* 42* 45*  ALKPHOS 54 49 50 75  BILITOT 2.3* 2.0* 1.7* 1.4*  PROT 6.8 6.2 5.8* 5.6*  ALBUMIN 3.2* 2.9* 2.7* 2.6*     Lipase  No results found for: LIPASE   Studies/Results: Dg Chest 2 View  04/27/2014   CLINICAL DATA:  Fatigue, loss of appetite.  Chronic diarrhea.  EXAM: CHEST  2 VIEW  COMPARISON:  07/17/2013  FINDINGS: Scarring in the lingula. Right lung is clear. No effusions. Heart is normal size. No acute bony abnormality.   IMPRESSION: Lingular scarring.  No active disease.   Electronically Signed   By: Rolm Baptise M.D.   On: 04/27/2014 15:13   US Abdomen Complete  04/27/2014   CLINICAL DATA:  Hyperbilirubinemia.  EXAM: ULTRASOUND ABDOMEN COMPLETE  COMPARISON:  Renal ultrasound 07/18/2013, no prior additional abdominal imaging.  FINDINGS: Gallbladder: Mildly distended. There is dependent sludge. There is an echogenic 6 mm focus that is nonmobile in the fundus, may reflect adhesive stone versus sludge ball. No gallstones or wall thickening visualized. No sonographic Murphy sign noted.  Common bile duct: Diameter: 5 mm at the porta hepatis, flares in the midportion to 11 mm. The distal most common bowel duct is obscured by bowel gas. There is no intrahepatic biliary ductal dilatation.  Liver: No focal lesion identified. Hepatic parenchyma is heterogeneous. Normal directional flow in the main portal vein.  IVC: No abnormality visualized.  Pancreas: Not visualized due to overlying bowel gas.  Spleen: Size and appearance within normal limits.  Right Kidney: Length: 9.2 cm. Echogenicity within normal limits. No mass or hydronephrosis visualized.  Left Kidney: Length: 9.0 cm. Echogenicity within normal limits. No mass or hydronephrosis visualized.  Abdominal aorta: No aneurysm visualized. Atherosclerotic calcifications are noted.  Other findings: No ascites.  IMPRESSION: 1. Sludge filled gallbladder that is mildly  distended. Non mobile echogenic 6 mm focus may reflect a sludge ball versus adhesive stone. No gallbladder wall thickening. No sonographic Murphy sign. 2. Apparent flaring of the mid common bowel duct to 11 mm, however normal to porta hepatis at 5 mm. No intrahepatic biliary ductal dilatation. Significance is uncertain. Correlation with any prior exams to evaluate for imaging stability would be most helpful. MRCP could be considered for further evaluation of the biliary tree, if the patient is able to cooperate with positioning  and breath-holding technique. 3. Mildly heterogeneous hepatic parenchyma without focal lesion.   Electronically Signed   By: Jeb Levering M.D.   On: 04/27/2014 19:10    Medications: . aspirin  81 mg Oral Daily  . enoxaparin (LOVENOX) injection  30 mg Subcutaneous QHS  . ferrous sulfate  325 mg Oral Daily  . hydrocortisone  20 mg Oral BID WC  . isosorbide mononitrate  15 mg Oral Daily  . levofloxacin (LEVAQUIN) IV  250 mg Intravenous Q24H  . levothyroxine  88 mcg Oral QAC breakfast  . multivitamin with minerals  1 tablet Oral Daily  . potassium chloride SA  20 mEq Oral Daily  . pravastatin  20 mg Oral QHS  . sulfamethoxazole-trimethoprim  1 tablet Oral Q M,W,F  . valGANciclovir  450 mg Oral Q M,W,F    Assessment/Plan 1. cholelithiasis with mild LFT elevations and some CBD dilatation/diarrhea 2. Malaise and progressive weakness 3. COPD with bilateral lung transplant 06/14/10/on chronic immunosuppression 4. Aortic stenosis severe to moderate/mild MR 5. Hx of fluid overload and diastolic dysfunction 6. Hypertension 7. Adrenal insufficiency 8. Hyperlipidemia 9. Hypothyroid 10. Hx of anemia 11. Anxiety and depression 12. Malnutrition    Plan:  Will discuss with Dr. Johney Maine.    LOS: 2 days    Ceniyah Thorp 04/29/2014

## 2014-04-29 NOTE — Consult Note (Signed)
Consultation  Referring Provider: [facility] hospitalist Primary Care Physician:  Annye Asa, MD Primary Gastroenterologist:  Dr.Jacobs  Reason for Consultation:  Abdominal pain, diarrhea,elevated liver tests  HPI: Nancy Blair is a 79 y.o. female with multiple medical problems. Most significantly she is status post bilateral lung transplant done at [facility] in 2012 for severe COPD. She tells me that a cancer was found at the time of her surgery but she did not require any other treatment for that. She also has had a Nissen fundoplication done at the same time his lung transplant to prevent acid issues. She also has history of chronic anemia, hypertension, moderate aortic stenosis, congestive heart failure, diabetes mellitus, and chronic kidney disease. She is maintained on CellCept and cyclosporine post transplant and also on chronic Bactrim and Valcyte. She is known to Dr. Ardis Hughs from prior colonoscopy for adenomatous colon polyps and diverticulosis. Last colon was done in 2011. She had undergone an EGD in 2013 because of melena and was found to have mild gastritis and a intact  Fundoplication. Patient was admitted to the hospital on 04/27/2014 after coming to the emergency room with several week history of weakness and poor oral intake. She had also been having watery diarrhea. Patient says she has had some problems with intermittent mild diarrhea ever since her surgery but over the past week to 10 days she had had significant increase in watery diarrhea. She was having 5-8 bowel movements per day. No melena or hematochezia. This was in the setting of also having an acute upper respiratory infection at the same time with suggestive significant cough and chest congestion. She did not have any nausea or vomiting no fever, and has no complaints of abdominal pain. She says she had myalgias and felt incredibly weak. She has not been on any new medications had any changes in dosages of  medications and no new recent antibiotics. Since admit she had upper abdominal ultrasound which did show a somewhat distended gallbladder with sludge no wall thickening. Mid common bile duct slightly dilated at 11 mm. Stool for C. difficile is negative GI pathogen panel and stool for O&P are still pending at this time. She was placed empirically on Levaquin because of the respiratory symptoms. Line Patient says she feels much better than on admission and is asking when she can go home. She says her "cold" symptoms are almost gone her appetite is starting to improve. She says the diarrhea has improved but hasn't resolved and she still has some urgent diarrhea after meals again no complaints of abdominal pain cramping or nausea.   Past Medical History  Diagnosis Date  . Emphysema of lung   . Hypertension   . Hyperlipidemia   . Allergy   . Anemia   . GERD (gastroesophageal reflux disease)   . Adrenal insufficiency   . Squamous acanthoma of skin 06/2011    rt leg  . Hypothyroidism   . CKD (chronic kidney disease) stage 4, GFR 15-29 ml/min     since 2012 (per [facility] records)  . Lung transplant status, bilateral   . Acute on chronic diastolic heart failure   . Ejection fraction   . Diabetes mellitus without complication     Past Surgical History  Procedure Laterality Date  . Appendectomy    . Abdominal hysterectomy    . Tonsillectomy    . Total lung replacement  2012    b/l lung transplant  . Esophagogastroduodenoscopy  05/26/2011    Procedure: ESOPHAGOGASTRODUODENOSCOPY (  EGD);  Surgeon: Owens Loffler, MD;  Location: [facility] ENDOSCOPY;  Service: Endoscopy;  Laterality: N/A;  . Nissen fundoplication      Prior to Admission medications   Medication Sig Start Date End Date Taking? Authorizing Provider  aspirin 81 MG chewable tablet Chew 81 mg by mouth daily.   Yes Historical Provider, MD  carvedilol (COREG) 6.25 MG tablet Take 1 tablet (6.25 mg total) by mouth 2 (two) times daily with a meal.  02/07/12  Yes Bonnielee Haff, MD  citalopram (CELEXA) 20 MG tablet Take 20 mg by mouth daily.   Yes Historical Provider, MD  cycloSPORINE modified (NEORAL) 25 MG capsule Take 75-100 mg by mouth 2 (two) times daily. 69m in the morning and 100 mg in the evening   Yes Historical Provider, MD  ferrous sulfate 325 (65 FE) MG tablet Take 325 mg by mouth daily with breakfast.   Yes Historical Provider, MD  furosemide (LASIX) 20 MG tablet Take 1 tablet (20 mg total) by mouth every other day. Takes on Monday, Wednesday, and Friday 10/23/13  Yes KMidge Minium MD  guaiFENesin (MUCINEX) 600 MG 12 hr tablet Take 600 mg by mouth 2 (two) times daily as needed for cough or to loosen phlegm.   Yes Historical Provider, MD  hydrocortisone (CORTEF) 20 MG tablet Take by mouth 2 (two) times daily.  10/27/11  Yes Historical Provider, MD  insulin regular (NOVOLIN R,HUMULIN R) 100 units/mL injection Inject 1-3 Units into the skin at bedtime as needed for high blood sugar. On sliding scale   Yes Historical Provider, MD  isosorbide mononitrate (IMDUR) 30 MG 24 hr tablet Take 0.5 tablets (15 mg total) by mouth daily. 10/23/13  Yes KMidge Minium MD  levothyroxine (SYNTHROID, LEVOTHROID) 88 MCG tablet Take 1 tablet (88 mcg total) by mouth daily before breakfast. 03/10/14  Yes KMidge Minium MD  Multiple Vitamin (MULITIVITAMIN WITH MINERALS) TABS Take 1 tablet by mouth daily.   Yes Historical Provider, MD  mycophenolate (CELLCEPT) 500 MG tablet Take 500 mg by mouth 2 (two) times daily.  12/21/11  Yes Historical Provider, MD  potassium chloride SA (K-DUR,KLOR-CON) 20 MEQ tablet Take 1 tablet (20 mEq total) by mouth daily. 09/05/13  Yes KMidge Minium MD  pravastatin (PRAVACHOL) 20 MG tablet TAKE 1 TABLET BY MOUTH EVERY NIGHT AT BEDTIME 04/08/14  Yes KMidge Minium MD  sulfamethoxazole-trimethoprim (BACTRIM,SEPTRA) 400-80 MG per tablet Take 1 tablet by mouth every Monday, Wednesday, and Friday.    Yes Historical  Provider, MD  valGANciclovir (VALCYTE) 450 MG tablet Take 450 mg by mouth every Monday, Wednesday, and Friday.    Yes Historical Provider, MD  zaleplon (SONATA) 5 MG capsule Take 5 mg by mouth at bedtime.   Yes Historical Provider, MD  citalopram (CELEXA) 40 MG tablet Take 1 tablet (40 mg total) by mouth daily. Patient not taking: Reported on 04/27/2014 02/02/14   KMidge Minium MD  Potassium Chloride ER 20 MEQ TBCR TAKE 1 TABLET BY MOUTH ONCE DAILY Patient not taking: Reported on 04/27/2014 04/04/14   KMidge Minium MD    Current Facility-Administered Medications  Medication Dose Route Frequency Provider Last Rate Last Dose  . 0.9 %  sodium chloride infusion   Intravenous Continuous ITheodis Blaze MD 75 mL/hr at 04/29/14 0150    . aspirin chewable tablet 81 mg  81 mg Oral Daily ITheodis Blaze MD   81 mg at 04/28/14 0936  . enoxaparin (LOVENOX) injection 30 mg  30 mg Subcutaneous QHS Theodis Blaze, MD   30 mg at 04/28/14 2202  . ferrous sulfate tablet 325 mg  325 mg Oral Daily Theodis Blaze, MD   325 mg at 04/28/14 0936  . guaiFENesin (MUCINEX) 12 hr tablet 600 mg  600 mg Oral BID PRN Theodis Blaze, MD      . hydrocortisone (CORTEF) tablet 20 mg  20 mg Oral BID WC Theodis Blaze, MD   20 mg at 04/29/14 4132  . isosorbide mononitrate (IMDUR) 24 hr tablet 15 mg  15 mg Oral Daily Theodis Blaze, MD   15 mg at 04/28/14 0936  . Levofloxacin (LEVAQUIN) IVPB 250 mg  250 mg Intravenous Q24H Theodis Blaze, MD      . levothyroxine (SYNTHROID, LEVOTHROID) tablet 88 mcg  88 mcg Oral QAC breakfast Theodis Blaze, MD   88 mcg at 04/29/14 0735  . morphine 2 MG/ML injection 1 mg  1 mg Intravenous Q2H PRN Theodis Blaze, MD      . multivitamin with minerals tablet 1 tablet  1 tablet Oral Daily Theodis Blaze, MD   1 tablet at 04/28/14 0936  . ondansetron (ZOFRAN) tablet 4 mg  4 mg Oral Q6H PRN Theodis Blaze, MD       Or  . ondansetron [facility]) injection 4 mg  4 mg Intravenous Q6H PRN Theodis Blaze, MD        . oxyCODONE (Oxy IR/ROXICODONE) immediate release tablet 5 mg  5 mg Oral Q4H PRN Theodis Blaze, MD      . potassium chloride SA (K-DUR,KLOR-CON) CR tablet 20 mEq  20 mEq Oral Daily Theodis Blaze, MD   20 mEq at 04/28/14 0936  . pravastatin (PRAVACHOL) tablet 20 mg  20 mg Oral QHS Theodis Blaze, MD   20 mg at 04/28/14 2202  . sulfamethoxazole-trimethoprim (BACTRIM,SEPTRA) 400-80 MG per tablet 1 tablet  1 tablet Oral Q M,W,F Theodis Blaze, MD      . valGANciclovir (VALCYTE) 450 MG tablet TABS 450 mg  450 mg Oral Q M,W,F Theodis Blaze, MD      . zolpidem [facility]) tablet 5 mg  5 mg Oral QHS PRN Theodis Blaze, MD   5 mg at 04/28/14 2202    Allergies as of 04/27/2014 - Review Complete 04/27/2014  Allergen Reaction Noted  . Grapefruit extract Anaphylaxis 08/08/2012  . Oysters [shellfish allergy] Anaphylaxis 08/08/2012  . Pineapple Anaphylaxis 07/16/2013  . Bupropion Other (See Comments) 11/11/2012  . Neomycin-bacitracin zn-polymyx Hives and Other (See Comments)   . Penicillins Swelling   . Prednisone Other (See Comments) 02/06/2012    Family History  Problem Relation Age of Onset  . Heart disease Mother   . Heart disease Father   . Pancreatic cancer Sister   . Colon cancer Neg Hx   . Malignant hyperthermia Neg Hx   . Hypertension Mother   . Hypertension Father     History   Social History  . Marital Status: Married    Spouse Name: N/A    Number of Children: N/A  . Years of Education: N/A   Occupational History  . retired     Teacher, adult education   Social History Main Topics  . Smoking status: Former Smoker -- 3.00 packs/day    Start date: 04/17/1952    Quit date: 07/16/1993  . Smokeless tobacco: Never Used     Comment: quit 25 years ago  . Alcohol Use: No  .  Drug Use: No  . Sexual Activity: No   Other Topics Concern  . Not on file   Social History Narrative   Lives with husband   Is a DNAR   Daughter who is an Rn [phone]    Review of Systems: Pertinent positive and  negative review of systems were noted in the above HPI section.  All other review of systems was otherwise negative.Marland Kitchen  Physical Exam: Vital signs in last 24 hours: Temp:  [98 F (36.7 C)-98.2 F (36.8 C)] 98.2 F (36.8 C) (01/13 0700) Pulse Rate:  [83-86] 85 (01/13 0700) Resp:  [18] 18 (01/13 0700) BP: (143-149)/(62-78) 143/72 mmHg (01/13 0700) SpO2:  [98 %-100 %] 98 % (01/13 0700) Weight:  [136 lb 11.2 oz (62.007 kg)] 136 lb 11.2 oz (62.007 kg) (01/13 0700) Last BM Date: 04/28/14 General:   Alert,  Well-developed, well-nourished,elderly WF pleasant and cooperative in NAD Head:  Normocephalic and atraumatic. Eyes:  Sclera clear, no icterus.   Conjunctiva pink. Ears:  Normal auditory acuity. Nose:  No deformity, discharge,  or lesions. Mouth:  No deformity or lesions.   Neck:  Supple; no masses or thyromegaly. Lungs:  Clear throughout to auscultation.   No wheezes, crackles, or rhonchi. Heart:  Regular rate and rhythm;aortic murmur Abdomen:  Soft,nontender, BS active,nonpalp mass or hsm.   Rectal:  Deferred  Msk:  Symmetrical without gross deformities. . Pulses:  Normal pulses noted. Extremities:  Without clubbing or edema. Neurologic:  Alert and  oriented x4;  grossly normal neurologically. Skin:  Intact without significant lesions or rashes.. Psych:  Alert and cooperative. Normal mood and affect.  Intake/Output from previous day: 01/12 0701 - 01/13 0700 In: 1733.8 [I.V.:1733.8] Out: 300 [Urine:300] Intake/Output this shift:    Lab Results:  Recent Labs  04/27/14 1446 04/28/14 0445 04/29/14 0523  WBC 10.5 8.6 8.3  HGB 11.5* 10.1* 9.2*  HCT 36.2 31.8* 27.8*  PLT 293 268 275   BMET  Recent Labs  04/27/14 2230 04/28/14 0445 04/29/14 0523  NA 131* 137 135  K 3.8 4.3 3.9  CL 102 110 110  CO2 19 18* 15*  GLUCOSE 116* 88 134*  BUN 65* 63* 48*  CREATININE 4.35* 4.02* 3.13*  CALCIUM 8.3* 8.6 8.0*   LFT   Hepatic Function Latest Ref Rng 04/29/2014 04/28/2014  04/27/2014  Total Protein 6.0 - 8.3 g/dL 5.6(L) 5.8(L) 6.2  Albumin 3.5 - 5.2 g/dL 2.6(L) 2.7(L) 2.9(L)  AST 0 - 37 U/L 69(H) 95(H) 73(H)  ALT 0 - 35 U/L 45(H) 42(H) 37(H)  Alk Phosphatase 39 - 117 U/L 75 50 49  Total Bilirubin 0.3 - 1.2 mg/dL 1.4(H) 1.7(H) 2.0(H)  Bilirubin, Direct 0.0 - 0.3 mg/dL - - -     PT/INR No results for input(s): LABPROT, INR in the last 72 hours. Hepatitis Panel No results for input(s): HEPBSAG, HCVAB, HEPAIGM, HEPBIGM in the last 72 hours.    IMPRESSION:  #5 79 year old white female status post double lung transplant 2012 for severe COPD on chronic immunosuppression with CellCept and cyclosporine. In addition on chronic antiviral and Bactrim. Patient admitted 48 hours ago with progressive weakness poor oral intake , upper respiratory symptoms and diarrhea. Patient had been ill at home for about a week prior to admission. Thus far workup negative for identifiable  bacterial sources. Cultures still pending. Stool for C. difficile was negative. She is improving with supportive management and empiric Levaquin for respiratory coverage. Suspect that she had an acute viral syndrome which is  now resolving. Elevated LFTs likely reactive and are improving. Though she does have gallbladder sludge- think this is incidental #2 mildly dilated mid common bile duct-etiology not clear #3 moderate aortic stenosis #4 stage IV chronic kidney disease #5 hypertension #6 chronic anemia #7 status post Nissen fundoplication [id number]    PLAN: #1  follow-up on pending stool studies #2 consider empiric course of metronidazole if diarrhea is persisting #3 MRCP to further evaluate mid common bile duct dilation #4 follow LFTs to normalization   Amy Esterwood  04/29/2014, 10:38 AM   GI Attending Note   Chart was reviewed and patient was examined. X-rays and lab were reviewed.    I agree with management and plans. Patient is clearly improving with regards to  diarrhea weakness and  congestion. I suspect she has a resolving gastroenteritis.  She does have moderate dilation of the common bile duct although, with significant obstruction, one would expect cholestatic liver tests.  Will proceed with MRCP to rule out any significant bile duct pathology.  Sandy Salaam. Deatra Ina, M.D., [facility] Cell [phone]

## 2014-04-30 LAB — LIPASE, BLOOD: Lipase: 37 U/L (ref 11–59)

## 2014-04-30 LAB — CBC
HEMATOCRIT: 29.1 % — AB (ref 36.0–46.0)
Hemoglobin: 9.3 g/dL — ABNORMAL LOW (ref 12.0–15.0)
MCH: 31.6 pg (ref 26.0–34.0)
MCHC: 32 g/dL (ref 30.0–36.0)
MCV: 99 fL (ref 78.0–100.0)
PLATELETS: 312 10*3/uL (ref 150–400)
RBC: 2.94 MIL/uL — ABNORMAL LOW (ref 3.87–5.11)
RDW: 15.8 % — ABNORMAL HIGH (ref 11.5–15.5)
WBC: 7.8 10*3/uL (ref 4.0–10.5)

## 2014-04-30 LAB — COMPREHENSIVE METABOLIC PANEL
ALBUMIN: 2.7 g/dL — AB (ref 3.5–5.2)
ALT: 38 U/L — ABNORMAL HIGH (ref 0–35)
AST: 40 U/L — AB (ref 0–37)
Alkaline Phosphatase: 68 U/L (ref 39–117)
Anion gap: 9 (ref 5–15)
BUN: 37 mg/dL — ABNORMAL HIGH (ref 6–23)
CO2: 18 mmol/L — AB (ref 19–32)
CREATININE: 2.67 mg/dL — AB (ref 0.50–1.10)
Calcium: 7.8 mg/dL — ABNORMAL LOW (ref 8.4–10.5)
Chloride: 112 mEq/L (ref 96–112)
GFR calc Af Amer: 19 mL/min — ABNORMAL LOW (ref >90)
GFR calc non Af Amer: 16 mL/min — ABNORMAL LOW (ref >90)
GLUCOSE: 118 mg/dL — AB (ref 70–99)
POTASSIUM: 3.6 mmol/L (ref 3.5–5.1)
SODIUM: 139 mmol/L (ref 135–145)
TOTAL PROTEIN: 5.6 g/dL — AB (ref 6.0–8.3)
Total Bilirubin: 0.8 mg/dL (ref 0.3–1.2)

## 2014-04-30 MED ORDER — MYCOPHENOLATE MOFETIL 250 MG PO CAPS
500.0000 mg | ORAL_CAPSULE | Freq: Two times a day (BID) | ORAL | Status: DC
  Administered 2014-04-30 – 2014-05-01 (×2): 500 mg via ORAL
  Filled 2014-04-30 (×3): qty 2

## 2014-04-30 MED ORDER — CYCLOSPORINE MODIFIED (NEORAL) 25 MG PO CAPS
75.0000 mg | ORAL_CAPSULE | Freq: Every day | ORAL | Status: DC
  Administered 2014-05-01: 75 mg via ORAL
  Filled 2014-04-30: qty 3

## 2014-04-30 MED ORDER — CYCLOSPORINE MODIFIED (NEORAL) 25 MG PO CAPS
100.0000 mg | ORAL_CAPSULE | Freq: Every day | ORAL | Status: DC
  Administered 2014-04-30: 100 mg via ORAL
  Filled 2014-04-30 (×2): qty 4

## 2014-04-30 NOTE — Care Management (Signed)
CARE MANAGEMENT NOTE 04/30/2014  Patient:  Nancy Blair, Nancy Blair   Account Number:  [id number]  Date Initiated:  04/29/2014  Documentation initiated by:  Nancy Blair  Subjective/Objective Assessment:   79 yo admitted with Diarrhea     Action/Plan:   From [place] Date:  05/01/2014   Anticipated DC Plan:  [place]  CM consult      [facility] Choice  HOME HEALTH   Choice offered to / List presented to:  C-1 Patient        [place] arranged  HH-2 PT      [place]   Status of service:  In process, will continue to follow Medicare Important Message given?   (If response is "NO", the following Medicare IM given date fields will be blank) Date Medicare IM given:   Medicare IM given by:   Date Additional Medicare IM given:   Additional Medicare IM given by:    Discharge Disposition:    Per UR Regulation:  Reviewed for med. necessity/level of care/duration of stay  If discussed at [place] of Stay Meetings, dates discussed:    Comments:  04/30/14 Nancy Doctor RN,BSN,NCM [phone] PT is recommending HHPT.  Pt is agreeable and is offered choice.  Pt would like to use [place] as sister has used them in the past.  Nancy Blair rep called to give referral. This Cm spoke with daughter Nancy Blair) who was very appreciative that pt was getting HHPT.  MD paged for HHPT order.  CM will await order and continue to follow.

## 2014-04-30 NOTE — Evaluation (Signed)
Occupational Therapy Evaluation Patient Details Name: Nancy Blair MRN: [id number] DOB: July 04, 1935 Today's Date: 04/30/2014    History of Present Illness Pt is very pleasant 79 yo female with COPD/emhysema, lung caner and s/p bilateral lung transplant 4 years ago, currently on immunosuppressive therapy CellCept and cyclosporine, also on prophylactic antibiotic Bactrim, valcyte (antiviral), presented to [facility] emergency department with main concern of several weeks duration of progressively worsening weakness, poor oral intake, failure to thrive, persistent watery diarrhea. Patient explains she saw her primary care physician several months ago with similar complaints, no specific medications started. Patient's symptoms have gotten progressively worse and family at bedside reports that patient is unable to make it to the bathroom on time due to severity of diarrhea. She describes diarrhea as as watery, nonbloody, 4-7 episodes per day.    Clinical Impression   Pt. Is very motivated to participate with OT and work on increaseing I and safety with ADLs and mobility. Pt. Was S with performing ADL taks and with transfers to commode and shower. Pt. States she is feeling much better and feels she will be safe at ALF.     Follow Up Recommendations  No OT follow up    Equipment Recommendations  None recommended by OT    Recommendations for Other Services       Precautions / Restrictions Precautions Precautions: Fall Restrictions Weight Bearing Restrictions: No      Mobility Bed Mobility Overal bed mobility: Modified Independent                Transfers                 General transfer comment: Pt. is S to AMB to bathroom.     Balance                                            ADL       Grooming: Wash/dry hands;Wash/dry face;Supervision/safety               Lower Body Dressing: Supervision/safety;Sit to/from stand   Toilet Transfer:  Supervision/safety;Ambulation;Comfort height toilet;Grab bars   Toileting- Clothing Manipulation and Hygiene: Supervision/safety;Sit to/from stand         General ADL Comments: Pt. feels she is much better and is close to baseline.      Vision                     Perception     Praxis      Pertinent Vitals/Pain       Hand Dominance     Extremity/Trunk Assessment             Communication     Cognition Arousal/Alertness: Awake/alert Behavior During Therapy: WFL for tasks assessed/performed Overall Cognitive Status: Within Functional Limits for tasks assessed                     General Comments       Exercises       Shoulder Instructions      Home Living                                          Prior Functioning/Environment  OT Diagnosis:     OT Problem List:     OT Treatment/Interventions:      OT Goals(Current goals can be found in the care plan section) Acute Rehab OT Goals Patient Stated Goal:  (go back to ALF soon. ) ADL Goals Pt Will Perform Grooming: with modified independence Pt Will Transfer to Toilet: with modified independence Pt Will Perform Toileting - Clothing Manipulation and hygiene: with modified independence Pt Will Perform Tub/Shower Transfer: Shower transfer;with modified independence;ambulating;shower seat  OT Frequency: Min 2X/week   Barriers to D/C:            Co-evaluation              End of Session    Activity Tolerance: Patient tolerated treatment well Patient left: in bed;with call bell/phone within reach   Time: 1142-1205 OT Time Calculation (min): 23 min Charges:  OT General Charges $OT Visit: 1 Procedure OT Treatments $Self Care/Home Management : 23-37 mins G-Codes:    Liviya Santini 19-May-2014, 12:06 PM

## 2014-04-30 NOTE — Progress Notes (Signed)
Patient ID: Nancy Blair, female   DOB: 06-05-1935, 79 y.o.   MRN: [id number]  [facility] Progress Note  Subjective:   Feeling much better- no c/o abdominal pain, diarrhea resolving, respiratory sxs much improved.   MRCP- tiny gallstones and sludge, CBD normal for age ,no intrahepatic ductal dilation Stool for O/P neg, Gi pathogen panel still Pending  Objective:  Vital signs in last 24 hours: Temp:  [98.1 F (36.7 C)-98.3 F (36.8 C)] 98.3 F (36.8 C) (01/14 0554) Pulse Rate:  [81] 81 (01/14 0554) Resp:  [18] 18 (01/14 0554) BP: (132-150)/(66-82) 150/82 mmHg (01/14 0554) SpO2:  [97 %-99 %] 97 % (01/14 0554) Weight:  [106 lb (48.081 kg)-133 lb (60.328 kg)] 133 lb (60.328 kg) (01/14 0554) Last BM Date: 04/29/14 General:   Alert,  Well-developed, WF   in NAD Heart:  Regular rate and rhythm; no murmurs  Abdomen:  Soft, nontender and nondistended. Normal bowel sounds, without guarding, and without rebound.   Extremities:  Without edema. Neurologic:  Alert and  oriented x4;  grossly normal neurologically. Psych:  Alert and cooperative. Normal mood and affect.  Intake/Output from previous day: 01/13 0701 - 01/14 0700 In: 1123.7 [I.V.:1123.7] Out: 1 [Stool:1] Intake/Output this shift:    Lab Results:  Recent Labs  04/28/14 0445 04/29/14 0523 04/30/14 0605  WBC 8.6 8.3 7.8  HGB 10.1* 9.2* 9.3*  HCT 31.8* 27.8* 29.1*  PLT 268 275 312   BMET  Recent Labs  04/28/14 0445 04/29/14 0523 04/30/14 0605  NA 137 135 139  K 4.3 3.9 3.6  CL 110 110 112  CO2 18* 15* 18*  GLUCOSE 88 134* 118*  BUN 63* 48* 37*  CREATININE 4.02* 3.13* 2.67*  CALCIUM 8.6 8.0* 7.8*   LFT  Recent Labs  04/30/14 0605  PROT 5.6*  ALBUMIN 2.7*  AST 40*  ALT 38*  ALKPHOS 68  BILITOT 0.8   PT/INR No results for input(s): LABPROT, INR in the last 72 hours. Hepatitis Panel No results for input(s): HEPBSAG, HCVAB, HEPAIGM, HEPBIGM in the last 72 hours.  Assessment /  Plan: #1 79 yo female s/p bilateral lung transplant,chronic immunosuppression- with acute illness with URI, and diarrhea-  Symptoms all but gone. Suspect viral #2 elevated LFT's -likely reactive, and normalizing #3 ? Mid CBD dilation- MRCP negative #4 Incidental GB sludge #5 CKD #6 Aortic stenosis  No further GI workup indicated She hopes to go home tomorrow- will sign off Principal Problem:   Diarrhea Active Problems:   Hypothyroidism   Essential hypertension   Lung transplant status, bilateral   Aortic stenosis   Chronic diastolic heart failure   GERD (gastroesophageal reflux disease)   CKD (chronic kidney disease) stage 4, GFR 15-29 ml/min   Dehydration   Gallstones   Transaminitis   Abnormal findings on imaging of biliary tract     LOS: 3 days   Amy Esterwood  04/30/2014, 9:42 AM   GI Attending Note  I have personally taken an interval history, reviewed the chart, and examined the patient.  I agree with the extender's note, impression and recommendations.  No evidence for active biliary tract disease by MRCP.  Sandy Salaam. Deatra Ina, MD, [facility] [phone]

## 2014-04-30 NOTE — Progress Notes (Signed)
Subjective: Patient has no complaints this morning No abdominal symptoms Tolerating regular diet, including bacon, without difficulty Bowel movements are more formed today.  Objective: Vital signs in last 24 hours: Temp:  [98.1 F (36.7 C)-98.3 F (36.8 C)] 98.3 F (36.8 C) (01/14 0554) Pulse Rate:  [81] 81 (01/14 0554) Resp:  [18] 18 (01/14 0554) BP: (132-150)/(66-82) 150/82 mmHg (01/14 0554) SpO2:  [97 %-99 %] 97 % (01/14 0554) Weight:  [106 lb (48.081 kg)-133 lb (60.328 kg)] 133 lb (60.328 kg) (01/14 0554) Last BM Date: 04/29/14  Intake/Output from previous day: 01/13 0701 - 01/14 0700 In: 1123.7 [I.V.:1123.7] Out: 1 [Stool:1] Intake/Output this shift:    General appearance: alert, cooperative and no distress GI: soft, non-tender; bowel sounds normal; no masses,  no organomegaly  Lab Results:   Recent Labs  04/29/14 0523 04/30/14 0605  WBC 8.3 7.8  HGB 9.2* 9.3*  HCT 27.8* 29.1*  PLT 275 312   BMET  Recent Labs  04/29/14 0523 04/30/14 0605  NA 135 139  K 3.9 3.6  CL 110 112  CO2 15* 18*  GLUCOSE 134* 118*  BUN 48* 37*  CREATININE 3.13* 2.67*  CALCIUM 8.0* 7.8*    Hepatic Function Latest Ref Rng 04/30/2014 04/29/2014 04/28/2014  Total Protein 6.0 - 8.3 g/dL 5.6(L) 5.6(L) 5.8(L)  Albumin 3.5 - 5.2 g/dL 2.7(L) 2.6(L) 2.7(L)  AST 0 - 37 U/L 40(H) 69(H) 95(H)  ALT 0 - 35 U/L 38(H) 45(H) 42(H)  Alk Phosphatase 39 - 117 U/L 68 75 50  Total Bilirubin 0.3 - 1.2 mg/dL 0.8 1.4(H) 1.7(H)  Bilirubin, Direct 0.0 - 0.3 mg/dL - - -     PT/INR No results for input(s): LABPROT, INR in the last 72 hours. ABG No results for input(s): PHART, HCO3 in the last 72 hours.  Invalid input(s): PCO2, PO2  Studies/Results: Mr Abdomen Mrcp Wo Cm  04/30/2014   CLINICAL DATA:  Elevated liver function studies and dilated common bile duct.  EXAM: MRI ABDOMEN WITHOUT CONTRAST  (INCLUDING MRCP)  TECHNIQUE: Multiplanar multisequence MR imaging of the abdomen was performed.  Heavily T2-weighted images of the biliary and pancreatic ducts were obtained, and three-dimensional MRCP images were rendered by post processing.  COMPARISON:  Ultrasound 04/27/2014  FINDINGS: The liver is unremarkable. No focal hepatic lesions or intrahepatic biliary dilatation. The gallbladder demonstrates layering debris which may be a combination of sludge and tiny gallstones. The common bile duct measures a maximum of 8 mm in the porta hepatis and tapers normally in the head of the pancreas. No common bile duct stones are identified. The pancreatic duct is normal in caliber and has a normal course.  No pancreatic mass or inflammation. The spleen is normal in size. No focal lesions. The adrenal glands and kidneys are unremarkable. There are tiny bilateral renal cysts and scarring changes involving the right kidney.  There is a small hiatal hernia noted. Stomach is unremarkable. The duodenum is slightly patulous and tortuous but no mass or obstruction. No mesenteric or retroperitoneal mass or adenopathy. Atherosclerotic changes involving the aorta but no aneurysm.  The bony structures are unremarkable.  IMPRESSION: 1. Normal caliber and course of the common bile duct and pancreatic duct for age. No common bile duct stones. 2. Layering debris in the gallbladder likely combination of sludge and tiny gallstones. 3. Normal MR appearance of the pancreas 4. No abdominal mass or adenopathy.   Electronically Signed   By: Kalman Jewels M.D.   On: 04/30/2014 08:23  Mr 3d Recon At Scanner  04/30/2014   CLINICAL DATA:  Elevated liver function studies and dilated common bile duct.  EXAM: MRI ABDOMEN WITHOUT CONTRAST  (INCLUDING MRCP)  TECHNIQUE: Multiplanar multisequence MR imaging of the abdomen was performed. Heavily T2-weighted images of the biliary and pancreatic ducts were obtained, and three-dimensional MRCP images were rendered by post processing.  COMPARISON:  Ultrasound 04/27/2014  FINDINGS: The liver is  unremarkable. No focal hepatic lesions or intrahepatic biliary dilatation. The gallbladder demonstrates layering debris which may be a combination of sludge and tiny gallstones. The common bile duct measures a maximum of 8 mm in the porta hepatis and tapers normally in the head of the pancreas. No common bile duct stones are identified. The pancreatic duct is normal in caliber and has a normal course.  No pancreatic mass or inflammation. The spleen is normal in size. No focal lesions. The adrenal glands and kidneys are unremarkable. There are tiny bilateral renal cysts and scarring changes involving the right kidney.  There is a small hiatal hernia noted. Stomach is unremarkable. The duodenum is slightly patulous and tortuous but no mass or obstruction. No mesenteric or retroperitoneal mass or adenopathy. Atherosclerotic changes involving the aorta but no aneurysm.  The bony structures are unremarkable.  IMPRESSION: 1. Normal caliber and course of the common bile duct and pancreatic duct for age. No common bile duct stones. 2. Layering debris in the gallbladder likely combination of sludge and tiny gallstones. 3. Normal MR appearance of the pancreas 4. No abdominal mass or adenopathy.   Electronically Signed   By: Kalman Jewels M.D.   On: 04/30/2014 08:23    Anti-infectives: Anti-infectives    Start     Dose/Rate Route Frequency Ordered Stop   04/30/14 1600  Levofloxacin (LEVAQUIN) IVPB 250 mg     250 mg50 mL/hr over 60 Minutes Intravenous Every 48 hours 04/29/14 1313     04/29/14 1600  Levofloxacin (LEVAQUIN) IVPB 250 mg  Status:  Discontinued     250 mg50 mL/hr over 60 Minutes Intravenous Every 24 hours 04/29/14 0748 04/29/14 1313   04/29/14 1000  sulfamethoxazole-trimethoprim (BACTRIM,SEPTRA) 400-80 MG per tablet 1 tablet     1 tablet Oral Every M-W-F 04/27/14 2136     04/29/14 1000  valGANciclovir (VALCYTE) 450 MG tablet TABS 450 mg     450 mg Oral Every M-W-F 04/27/14 2136     04/28/14 1600   levofloxacin (LEVAQUIN) IVPB 500 mg  Status:  Discontinued     500 mg100 mL/hr over 60 Minutes Intravenous Every 24 hours 04/28/14 1500 04/29/14 0748      Assessment/Plan: Cholelithiasis - no sign of cholecysitits Mild LFT abnormalities - improving Lipase normal No CBD abnormalities on MRCP Significant medical comorbidities. It is unclear what may have caused her diarrhea and LFT abnormalities, but at this time, she has no indications to pursue cholecystectomy.  Any surgical procedure would be higher risk with her comorbidities. We will sign off for now.  Please call us for any questions.   LOS: 3 days    Aija Scarfo K. 04/30/2014

## 2014-04-30 NOTE — Progress Notes (Signed)
Noted MRCP is negative other than stones/sludge.  No abdominal pain.  Diarrhea is not likely related to her gallstones.  No lap chole indicated at this time.  Will sign off, call with questions concerns.  Coralie Keens, PA-C General Surgery [facility] Surgery [phone] 9:45am

## 2014-04-30 NOTE — Progress Notes (Addendum)
Patient ID: Nancy Blair, female   DOB: 1935/08/09, 79 y.o.   MRN: [id number]  [facility] PROGRESS NOTE  Nancy Blair VHQ:[id number] DOB: 01-28-1936 DOA: 04/27/2014 PCP: Annye Asa, MD  Brief narrative: Pt is very pleasant 79 yo female with COPD/emhysema, lung caner and s/p bilateral lung transplant 4 years ago, currently on immunosuppressive therapy CellCept and cyclosporine, also on prophylactic antibiotic Bactrim, valcyte (antiviral), presented to [facility] emergency department with main concern of several weeks duration of progressively worsening weakness, poor oral intake, failure to thrive, persistent watery diarrhea. Patient explains she saw her primary care physician several months ago with similar complaints, no specific medications started. Patient's symptoms have gotten progressively worse and family at bedside reports that patient is unable to make it to the bathroom on time due to severity of diarrhea. She describes diarrhea as as watery, nonbloody, 4-7 episodes per day. Patient denies chest pain or shortness of breath, no specific abdominal pain, no vomiting, no fevers and chills, no recent sick contacts or exposures. In addition patient explains she is not taking any specific antibiotic other than the one for prophylaxis as noted above.  In emergency department, patient noted to be hemodynamically stable, vital signs stable, blood work notable for creatinine 4.3, bilirubin ~2. Tried hospitalist asked to admit for further evaluation and management.  Assessment and Plan:   Active Problems: Diarrhea  - With subsequent dehydration - C. Diff negative, GI stool panel still pending  Hyperbilirubinemia with transaminitis  - ABD [place] with sludge filled gallbladder that is mildly distended, non mobile 6 mm focus ? sludge ball versus adhesive stone - surgery and GI teams consulted for further assistance, no indication for surgical intervention so surgery team signed off -  MRCP with small gallstones and sludge, no further interventions recommended per GI  - LFT's continue trending down  Acute on chronic kidney disease stage 3-4 - Creatinine at baseline around 2, on admission above baseline due to prerenal etiology but Cr continues trending down  - continue IV fluids, hold Lasix that patient takes at home  - Repeat BMP in the morning  - resume cellcept and cyclosporine today and if renal function stable in AM, pt can be d/c - pt advised to see her lung transplant specialist at [facility] as soon as possible - I asked pt's daughter to have her doctor call me, still awaiting the call back (I called [facility] as well and left message) UTI - UA suggestive of UTI but pt with no specific urinary concerns - she has immunocompromised state - continue Levaquin day #3, stop after today's dose as urine culture with no growth  Immunocompromise state  - Continue hydrocortisone tablet  - awaiting call from daughter to let me know the name of the doctor at [facility] (transplant specialist) to discuss medications  - will resume cellcept and cyclosporine today 1/14  - continue prophylactic abx bactrim, valcyte  Status post bilateral lung transplant  - Currently does not require oxygen, respiratory status stable  Moderate malnutrition  - In the setting of fairly chronic illness, diarrhea and poor oral intake  - pt reports feeling well this AM and wants to eat Hypothyroidism - continue synthroid  Anemia of chronic disease - immunosuppression, CKD stage II - III  - slight drop in Hg likely dilutional from IVF - no signs of bleeding  DVT prophylaxis  Lovenox SQ while pt is in hospital  Code Status: Full Family Communication: Pt at bedside, daughters over the phone  Disposition Plan: Home possibly in AM if renal function stable   IV access:   Peripheral IV Procedures and diagnostic studies;  Dg Chest 2 View 04/27/2014 Lingular scarring. No active disease.    US Abdomen Complete 04/27/2014 Sludge filled gallbladder that is mildly distended. Non mobile echogenic 6 mm focus may reflect a sludge ball versus adhesive stone. No gallbladder wall thickening. No sonographic Murphy sign. Apparent flaring of the mid common bowel duct to 11 mm, however normal to porta hepatis at 5 mm. No intrahepatic biliary ductal dilatation. Significance is uncertain. Correlation with any prior exams to evaluate for imaging stability would be most helpful. MRCP could be considered for further evaluation of the biliary tree, if the patient is able to cooperate with positioning and breath-holding technique. Mildly heterogeneous hepatic parenchyma without focal lesion.   Mr Abdomen Mrcp Wo Cm  04/30/2014   Normal caliber and course of the common bile duct and pancreatic duct for age. No common bile duct stones. 2. Layering debris in the gallbladder likely combination of sludge and tiny gallstones.  Normal MR appearance of the pancreas 4. No abdominal mass or adenopathy.     Medical consultants:   Surgery GI Pulmonologist at [facility] - awaiting the name of the doctor so I can call appropriate doctor  Other consultants:   None Anti-infectives:   Levaquin 1/12 --> 1/14  Faye Ramsay, MD  [facility] Pager [phone]  If 7PM-7AM, please contact night-coverage [email or web address] Password [facility] 04/30/2014, 1:37 PM   LOS: 3 days   HPI/Subjective: No events overnight.   Objective: Filed Vitals:   04/28/14 2131 04/29/14 0700 04/29/14 2201 04/30/14 0554  BP: 149/78 143/72 132/66 150/82  Pulse: 83 85 81 81  Temp: 98.2 F (36.8 C) 98.2 F (36.8 C) 98.1 F (36.7 C) 98.3 F (36.8 C)  TempSrc: Oral Oral Oral Oral  Resp: 18 18 18 18   Height:      Weight:  62.007 kg (136 lb 11.2 oz)  60.328 kg (133 lb)  SpO2: 100% 98% 99% 97%    Intake/Output Summary (Last 24 hours) at 04/30/14 1337 Last data filed at 04/30/14 0956  Gross per 24 hour  Intake 1363.7 ml  Output      0  ml  Net 1363.7 ml    Exam:   General:  Pt is alert, follows commands appropriately, not in acute distress  Cardiovascular: Regular rate and rhythm, no rubs, no gallops  Respiratory: Clear to auscultation bilaterally, no wheezing, no crackles, no rhonchi  Abdomen: Soft, non tender, non distended, bowel sounds present, no guarding  Extremities: No edema, pulses DP and PT palpable bilaterally  Neuro: Grossly nonfocal  Data Reviewed: Basic Metabolic Panel:  Recent Labs Lab 04/27/14 1446 04/27/14 2230 04/28/14 0445 04/29/14 0523 04/30/14 0605  NA 134* 131* 137 135 139  K 4.4 3.8 4.3 3.9 3.6  CL 108 102 110 110 112  CO2 19 19 18* 15* 18*  GLUCOSE 135* 116* 88 134* 118*  BUN 65* 65* 63* 48* 37*  CREATININE 4.31* 4.35* 4.02* 3.13* 2.67*  CALCIUM 8.8 8.3* 8.6 8.0* 7.8*  MG 2.5 2.4  --   --   --   PHOS  --  4.7*  --   --   --    Liver Function Tests:  Recent Labs Lab 04/27/14 1446 04/27/14 2230 04/28/14 0445 04/29/14 0523 04/30/14 0605  AST 78* 73* 95* 69* 40*  ALT 39* 37* 42* 45* 38*  ALKPHOS 54 49 50 75 68  BILITOT 2.3* 2.0* 1.7* 1.4* 0.8  PROT 6.8 6.2 5.8* 5.6* 5.6*  ALBUMIN 3.2* 2.9* 2.7* 2.6* 2.7*    Recent Labs Lab 04/29/14 0523 04/30/14 0605  LIPASE 30 37   CBC:  Recent Labs Lab 04/27/14 1446 04/28/14 0445 04/29/14 0523 04/30/14 0605  WBC 10.5 8.6 8.3 7.8  NEUTROABS 8.2*  --   --   --   HGB 11.5* 10.1* 9.2* 9.3*  HCT 36.2 31.8* 27.8* 29.1*  MCV 99.2 99.1 98.2 99.0  PLT 293 268 275 312    Recent Results (from the past 240 hour(s))  Urine culture     Status: None   Collection Time: 04/27/14  4:55 PM  Result Value Ref Range Status   Specimen Description URINE, CLEAN CATCH  Final   Special Requests Immunocompromised  Final   Colony Count NO GROWTH Performed at [facility]   Final   Culture NO GROWTH Performed at [facility]   Final   Report Status 04/28/2014 FINAL  Final  Stool culture     Status: None (Preliminary  result)   Collection Time: 04/28/14  1:24 PM  Result Value Ref Range Status   Specimen Description STOOL  Final   Special Requests NONE  Final   Culture   Final    NO SUSPICIOUS COLONIES, CONTINUING TO HOLD Performed at [facility]    Report Status PENDING  Incomplete  Ova and parasite examination     Status: None   Collection Time: 04/28/14  1:24 PM  Result Value Ref Range Status   Specimen Description STOOL  Final   Special Requests NONE  Final   Ova and parasites   Final    NO OVA OR PARASITES SEEN MODERATE YEAST Performed at [facility]    Report Status 04/29/2014 FINAL  Final  Clostridium Difficile by PCR     Status: None   Collection Time: 04/28/14  8:05 PM  Result Value Ref Range Status   C difficile by pcr NEGATIVE NEGATIVE Final    Comment: Performed at [facility]     Scheduled Meds: . aspirin  81 mg Oral Daily  . enoxaparin (LOVENOX) injection  30 mg Subcutaneous QHS  . ferrous sulfate  325 mg Oral Daily  . hydrocortisone  20 mg Oral BID WC  . isosorbide mononitrate  15 mg Oral Daily  . levofloxacin (LEVAQUIN) IV  250 mg Intravenous Q48H  . levothyroxine  88 mcg Oral QAC breakfast  . multivitamin with minerals  1 tablet Oral Daily  . potassium chloride SA  20 mEq Oral Daily  . pravastatin  20 mg Oral QHS  . sulfamethoxazole-trimethoprim  1 tablet Oral Q M,W,F  . valGANciclovir  450 mg Oral Q M,W,F   Continuous Infusions: . sodium chloride 75 mL/hr at 04/30/14 [phone]

## 2014-04-30 NOTE — Progress Notes (Signed)
Physical Therapy Treatment Patient Details Name: Nancy Blair MRN: [id number] DOB: 11-11-1935 Today's Date: 04/30/2014    History of Present Illness Pt is very pleasant 79 yo female with COPD/emhysema, lung caner and s/p bilateral lung transplant 4 years ago, currently on immunosuppressive therapy CellCept and cyclosporine, also on prophylactic antibiotic Bactrim, valcyte (antiviral), presented to [facility] emergency department with main concern of several weeks duration of progressively worsening weakness, poor oral intake, failure to thrive, persistent watery diarrhea. Patient explains she saw her primary care physician several months ago with similar complaints, no specific medications started. Patient's symptoms have gotten progressively worse and family at bedside reports that patient is unable to make it to the bathroom on time due to severity of diarrhea. She describes diarrhea as as watery, nonbloody, 4-7 episodes per day.     PT Comments    Assisted pt OOB to amb around unit.  Used RW this time just for increased stability and to increase amb distance.  Pt typically does not need any AD.   Follow Up Recommendations  Home health PT     Equipment Recommendations  None recommended by PT    Recommendations for Other Services       Precautions / Restrictions Precautions Precautions: Fall Restrictions Weight Bearing Restrictions: No    Mobility  Bed Mobility Overal bed mobility: Modified Independent                Transfers Overall transfer level: Needs assistance Equipment used: None Transfers: Sit to/from Stand Sit to Stand: Supervision;Modified independent (Device/Increase time)         General transfer comment: good safety cognition/use of hands  Ambulation/Gait Ambulation/Gait assistance: Supervision Ambulation Distance (Feet): 350 Feet Assistive device: Rolling walker (2 wheeled) Gait Pattern/deviations: Step-through pattern;Decreased stride  length;Trunk flexed Gait velocity: decreased   General Gait Details: used RW this time just for increased safety and to increase amb distance.  Pt typically uses nothing and getting self around her room so.     Stairs            Wheelchair Mobility    Modified Rankin (Stroke Patients Only)       Balance                                    Cognition Arousal/Alertness: Awake/alert Behavior During Therapy: WFL for tasks assessed/performed Overall Cognitive Status: Within Functional Limits for tasks assessed                      Exercises      General Comments        Pertinent Vitals/Pain Pain Assessment: No/denies pain    Home Living                      Prior Function            PT Goals (current goals can now be found in the care plan section) Acute Rehab PT Goals Patient Stated Goal:  (go back to ALF soon. ) Progress towards PT goals: Progressing toward goals    Frequency  Min 3X/week    PT Plan      Co-evaluation             End of Session Equipment Utilized During Treatment: Gait belt Activity Tolerance: Patient tolerated treatment well Patient left: in bed;with call bell/phone within reach  Time: 1051-1100 PT Time Calculation (min) (ACUTE ONLY): 9 min  Charges:  $Gait Training: 8-22 mins                    G Codes:      Nancy Blair  PTA [facility]  Acute  Rehab Pager      [phone]

## 2014-05-01 DIAGNOSIS — I10 Essential (primary) hypertension: Secondary | ICD-10-CM

## 2014-05-01 DIAGNOSIS — Z942 Lung transplant status: Secondary | ICD-10-CM

## 2014-05-01 DIAGNOSIS — R740 Nonspecific elevation of levels of transaminase and lactic acid dehydrogenase [LDH]: Secondary | ICD-10-CM

## 2014-05-01 DIAGNOSIS — I5032 Chronic diastolic (congestive) heart failure: Secondary | ICD-10-CM

## 2014-05-01 LAB — BASIC METABOLIC PANEL
Anion gap: 8 (ref 5–15)
BUN: 31 mg/dL — ABNORMAL HIGH (ref 6–23)
CO2: 17 mmol/L — ABNORMAL LOW (ref 19–32)
CREATININE: 2.52 mg/dL — AB (ref 0.50–1.10)
Calcium: 7.7 mg/dL — ABNORMAL LOW (ref 8.4–10.5)
Chloride: 113 mEq/L — ABNORMAL HIGH (ref 96–112)
GFR calc Af Amer: 20 mL/min — ABNORMAL LOW (ref >90)
GFR, EST NON AFRICAN AMERICAN: 17 mL/min — AB (ref >90)
Glucose, Bld: 129 mg/dL — ABNORMAL HIGH (ref 70–99)
POTASSIUM: 3.5 mmol/L (ref 3.5–5.1)
Sodium: 138 mmol/L (ref 135–145)

## 2014-05-01 LAB — CBC
HCT: 30.7 % — ABNORMAL LOW (ref 36.0–46.0)
HEMOGLOBIN: 9.7 g/dL — AB (ref 12.0–15.0)
MCH: 31.4 pg (ref 26.0–34.0)
MCHC: 31.6 g/dL (ref 30.0–36.0)
MCV: 99.4 fL (ref 78.0–100.0)
Platelets: 308 10*3/uL (ref 150–400)
RBC: 3.09 MIL/uL — AB (ref 3.87–5.11)
RDW: 16 % — AB (ref 11.5–15.5)
WBC: 8.6 10*3/uL (ref 4.0–10.5)

## 2014-05-01 MED ORDER — HYDRALAZINE HCL 20 MG/ML IJ SOLN
5.0000 mg | Freq: Once | INTRAMUSCULAR | Status: AC
  Administered 2014-05-01: 5 mg via INTRAVENOUS
  Filled 2014-05-01: qty 1

## 2014-05-01 MED ORDER — ISOSORBIDE MONONITRATE ER 30 MG PO TB24: 30.0000 mg | ORAL_TABLET | Freq: Every day | ORAL | Status: DC

## 2014-05-01 NOTE — Care Management Note (Signed)
Medicare Important Message given?  YES (If response is "NO", the following Medicare IM given date fields will be blank) Date Medicare IM given:  05/01/2014 Medicare IM given by:  Marney Doctor

## 2014-05-01 NOTE — Discharge Summary (Signed)
Physician Discharge Summary  [name] CBJ:[id number] DOB: Mar 15, 1936 DOA: 04/27/2014  PCP: Nancy Asa, MD  Admit date: 04/27/2014 Discharge date: 05/01/2014  Time spent: 35 minutes  Recommendations for Outpatient Follow-up:  1. Please follow-up and repeat BMP, she developed acute on chronic renal failure during this hospitalization with creatinine tramadol to 2.52 on 05/01/2014. 2. Follow-up of blood pressures, she was hypertensive during this hospitalization 3. She was set up with home health services for home PT and RN prior to discharge  Discharge Diagnoses:  Principal Problem:   Diarrhea Active Problems:   Hypothyroidism   Essential hypertension   Lung transplant status, bilateral   Aortic stenosis   Chronic diastolic heart failure   GERD (gastroesophageal reflux disease)   CKD (chronic kidney disease) stage 4, GFR 15-29 ml/min   Dehydration   Gallstones   Transaminitis   Abnormal findings on imaging of biliary tract   Discharge Condition: Stable/improved  Diet recommendation: Regular diet  Filed Weights   04/29/14 0700 04/30/14 0554 05/01/14 0455  Weight: 62.007 kg (136 lb 11.2 oz) 60.328 kg (133 lb) 61.054 kg (134 lb 9.6 oz)    History of present illness:  Pt is very pleasant 79 yo female with COPD/emhysema, lung caner and s/p bilateral lung transplant 4 years ago, currently on immunosuppressive therapy CellCept and cyclosporine, also on prophylactic antibiotic Bactrim, valcyte (antiviral), presented to [facility] emergency department with main concern of several weeks duration of progressively worsening weakness, poor oral intake, failure to thrive, persistent watery diarrhea. Patient explains she saw her primary care physician several months ago with similar complaints, no specific medications started. Patient's symptoms have gotten progressively worse and family at bedside reports that patient is unable to make it to the bathroom on time due to severity of  diarrhea. She describes diarrhea as as watery, nonbloody, 4-7 episodes per day. Patient denies chest pain or shortness of breath, no specific abdominal pain, no vomiting, no fevers and chills, no recent sick contacts or exposures. In addition patient explains she is not taking any specific antibiotic other than the one for prophylaxis as noted above.  Hospital Course:  Patient is a 79 year old female with a history of COPD/emphysema status post bilateral lung transplants on chronic immunosuppressive therapy admitted to the medicine service on 04/27/2014 presented with generalized weakness, failure to thrive, persistent diarrhea. Initial labs revealed elevated total bilirubin of 2.3. Abdominal ultrasound revealed sludge filled gallbladder mildly distended, non-mobile echogenic 6 mm focus which could reflect sludge versus adhesive stone. General surgery was consulted, did not feel patient had an acute cholecystitis or required surgical intervention. It was suspected that liver function tests mildly elevated due to hepatitis from chronic immunosuppressive medications. GI was consulted as well as she underwent MRCP on 04/29/2014. This study revealed normal caliber and course of the common bile duct and pancreatic duct, no common bile duct stones were seen. Patient's hospitalization also complicated by acute on chronic renal failure as her creatinine peaked at 4.35. Is likely due to combination of hypovolemia in setting of diarrhea that responded IV fluids. Her creatinine trended down to 2.5-5 day of discharge. Looking back at her records it appears her baseline creatinine is near 2.5. Patient showing overall gradual improvements, and by 05/01/2014 she reported feeling well up to go home. I spoke with her daughter over telephone who agreed with this plan. Prior to discharge services were set up for home PT and RN.   Consultations:  Gen. Surgery  GI  Discharge Exam: Danley Danker  Vitals:   05/01/14 0455  BP: 193/86   Pulse: 80  Temp: 97.9 F (36.6 C)  Resp: 18    General: Patient is awake, alert, calm, cooperative, no acute distress Cardiovascular: Regular rate and rhythm normal S1-S2 no murmurs rubs or gallops Respiratory: Normal respiratory effort, lungs are clear to auscultation Abdomen: Soft nontender nondistended Extremities: No edema  Discharge Instructions   Discharge Instructions    Call MD for:  difficulty breathing, headache or visual disturbances    Complete by:  As directed      Call MD for:  extreme fatigue    Complete by:  As directed      Call MD for:  hives    Complete by:  As directed      Call MD for:  persistant dizziness or light-headedness    Complete by:  As directed      Call MD for:  persistant nausea and vomiting    Complete by:  As directed      Call MD for:  redness, tenderness, or signs of infection (pain, swelling, redness, odor or green/yellow discharge around incision site)    Complete by:  As directed      Call MD for:  severe uncontrolled pain    Complete by:  As directed      Call MD for:  temperature >100.4    Complete by:  As directed      Diet - low sodium heart healthy    Complete by:  As directed      Increase activity slowly    Complete by:  As directed           Current Discharge Medication List    CONTINUE these medications which have NOT CHANGED   Details  aspirin 81 MG chewable tablet Chew 81 mg by mouth daily.    carvedilol (COREG) 6.25 MG tablet Take 1 tablet (6.25 mg total) by mouth 2 (two) times daily with a meal. Qty: 60 tablet, Refills: 1    cycloSPORINE modified (NEORAL) 25 MG capsule Take 75-100 mg by mouth 2 (two) times daily. 75mg  in the morning and 100 mg in the evening    ferrous sulfate 325 (65 FE) MG tablet Take 325 mg by mouth daily with breakfast.    furosemide (LASIX) 20 MG tablet Take 1 tablet (20 mg total) by mouth every other day. Takes on Monday, Wednesday, and Friday Qty: 90 tablet, Refills: 0    guaiFENesin  (MUCINEX) 600 MG 12 hr tablet Take 600 mg by mouth 2 (two) times daily as needed for cough or to loosen phlegm.    hydrocortisone (CORTEF) 20 MG tablet Take by mouth 2 (two) times daily.     insulin regular (NOVOLIN R,HUMULIN R) 100 units/mL injection Inject 1-3 Units into the skin at bedtime as needed for high blood sugar. On sliding scale    isosorbide mononitrate (IMDUR) 30 MG 24 hr tablet Take 0.5 tablets (15 mg total) by mouth daily. Qty: 90 tablet, Refills: 0    levothyroxine (SYNTHROID, LEVOTHROID) 88 MCG tablet Take 1 tablet (88 mcg total) by mouth daily before breakfast. Qty: 90 tablet, Refills: 1    Multiple Vitamin (MULITIVITAMIN WITH MINERALS) TABS Take 1 tablet by mouth daily.    mycophenolate (CELLCEPT) 500 MG tablet Take 500 mg by mouth 2 (two) times daily.     potassium chloride SA (K-DUR,KLOR-CON) 20 MEQ tablet Take 1 tablet (20 mEq total) by mouth daily. Qty: 30 tablet, Refills: 6  pravastatin (PRAVACHOL) 20 MG tablet TAKE 1 TABLET BY MOUTH EVERY NIGHT AT BEDTIME Qty: 30 tablet, Refills: 4    sulfamethoxazole-trimethoprim (BACTRIM,SEPTRA) 400-80 MG per tablet Take 1 tablet by mouth every Monday, Wednesday, and Friday.     valGANciclovir (VALCYTE) 450 MG tablet Take 450 mg by mouth every Monday, Wednesday, and Friday.     zaleplon (SONATA) 5 MG capsule Take 5 mg by mouth at bedtime.    citalopram (CELEXA) 40 MG tablet Take 1 tablet (40 mg total) by mouth daily. Qty: 30 tablet, Refills: 3   Associated Diagnoses: Depression    Potassium Chloride ER 20 MEQ TBCR TAKE 1 TABLET BY MOUTH ONCE DAILY Qty: 30 tablet, Refills: 4       Allergies  Allergen Reactions  . Grapefruit Extract Anaphylaxis  . Oysters [Shellfish Allergy] Anaphylaxis  . Pineapple Anaphylaxis  . Bupropion Other (See Comments)    sleepy  . Neomycin-Bacitracin Zn-Polymyx Hives and Other (See Comments)    blisters  . Penicillins Swelling    Just face swelling  . Prednisone Other (See  Comments)    crazy   Follow-up Information    Follow up with Nancy Asa, MD In 1 week.   Specialty:  Family Medicine   Contact information:   [place] [address] [phone]        The results of significant diagnostics from this hospitalization (including imaging, microbiology, ancillary and laboratory) are listed below for reference.    Significant Diagnostic Studies: Dg Chest 2 View  04/27/2014   CLINICAL DATA:  Fatigue, loss of appetite.  Chronic diarrhea.  EXAM: CHEST  2 VIEW  COMPARISON:  07/17/2013  FINDINGS: Scarring in the lingula. Right lung is clear. No effusions. Heart is normal size. No acute bony abnormality.  IMPRESSION: Lingular scarring.  No active disease.   Electronically Signed   By: Rolm Baptise M.D.   On: 04/27/2014 15:13   US Abdomen Complete  04/27/2014   CLINICAL DATA:  Hyperbilirubinemia.  EXAM: ULTRASOUND ABDOMEN COMPLETE  COMPARISON:  Renal ultrasound 07/18/2013, no prior additional abdominal imaging.  FINDINGS: Gallbladder: Mildly distended. There is dependent sludge. There is an echogenic 6 mm focus that is nonmobile in the fundus, may reflect adhesive stone versus sludge ball. No gallstones or wall thickening visualized. No sonographic Murphy sign noted.  Common bile duct: Diameter: 5 mm at the porta hepatis, flares in the midportion to 11 mm. The distal most common bowel duct is obscured by bowel gas. There is no intrahepatic biliary ductal dilatation.  Liver: No focal lesion identified. Hepatic parenchyma is heterogeneous. Normal directional flow in the main portal vein.  IVC: No abnormality visualized.  Pancreas: Not visualized due to overlying bowel gas.  Spleen: Size and appearance within normal limits.  Right Kidney: Length: 9.2 cm. Echogenicity within normal limits. No mass or hydronephrosis visualized.  Left Kidney: Length: 9.0 cm. Echogenicity within normal limits. No mass or hydronephrosis visualized.  Abdominal  aorta: No aneurysm visualized. Atherosclerotic calcifications are noted.  Other findings: No ascites.  IMPRESSION: 1. Sludge filled gallbladder that is mildly distended. Non mobile echogenic 6 mm focus may reflect a sludge ball versus adhesive stone. No gallbladder wall thickening. No sonographic Murphy sign. 2. Apparent flaring of the mid common bowel duct to 11 mm, however normal to porta hepatis at 5 mm. No intrahepatic biliary ductal dilatation. Significance is uncertain. Correlation with any prior exams to evaluate for imaging stability would be most helpful. MRCP could  be considered for further evaluation of the biliary tree, if the patient is able to cooperate with positioning and breath-holding technique. 3. Mildly heterogeneous hepatic parenchyma without focal lesion.   Electronically Signed   By: Jeb Levering M.D.   On: 04/27/2014 19:10   Mr Abdomen Mrcp Wo Cm  04/30/2014   CLINICAL DATA:  Elevated liver function studies and dilated common bile duct.  EXAM: MRI ABDOMEN WITHOUT CONTRAST  (INCLUDING MRCP)  TECHNIQUE: Multiplanar multisequence MR imaging of the abdomen was performed. Heavily T2-weighted images of the biliary and pancreatic ducts were obtained, and three-dimensional MRCP images were rendered by post processing.  COMPARISON:  Ultrasound 04/27/2014  FINDINGS: The liver is unremarkable. No focal hepatic lesions or intrahepatic biliary dilatation. The gallbladder demonstrates layering debris which may be a combination of sludge and tiny gallstones. The common bile duct measures a maximum of 8 mm in the porta hepatis and tapers normally in the head of the pancreas. No common bile duct stones are identified. The pancreatic duct is normal in caliber and has a normal course.  No pancreatic mass or inflammation. The spleen is normal in size. No focal lesions. The adrenal glands and kidneys are unremarkable. There are tiny bilateral renal cysts and scarring changes involving the right kidney.   There is a small hiatal hernia noted. Stomach is unremarkable. The duodenum is slightly patulous and tortuous but no mass or obstruction. No mesenteric or retroperitoneal mass or adenopathy. Atherosclerotic changes involving the aorta but no aneurysm.  The bony structures are unremarkable.  IMPRESSION: 1. Normal caliber and course of the common bile duct and pancreatic duct for age. No common bile duct stones. 2. Layering debris in the gallbladder likely combination of sludge and tiny gallstones. 3. Normal MR appearance of the pancreas 4. No abdominal mass or adenopathy.   Electronically Signed   By: Kalman Jewels M.D.   On: 04/30/2014 08:23   Mr 3d Recon At Scanner  04/30/2014   CLINICAL DATA:  Elevated liver function studies and dilated common bile duct.  EXAM: MRI ABDOMEN WITHOUT CONTRAST  (INCLUDING MRCP)  TECHNIQUE: Multiplanar multisequence MR imaging of the abdomen was performed. Heavily T2-weighted images of the biliary and pancreatic ducts were obtained, and three-dimensional MRCP images were rendered by post processing.  COMPARISON:  Ultrasound 04/27/2014  FINDINGS: The liver is unremarkable. No focal hepatic lesions or intrahepatic biliary dilatation. The gallbladder demonstrates layering debris which may be a combination of sludge and tiny gallstones. The common bile duct measures a maximum of 8 mm in the porta hepatis and tapers normally in the head of the pancreas. No common bile duct stones are identified. The pancreatic duct is normal in caliber and has a normal course.  No pancreatic mass or inflammation. The spleen is normal in size. No focal lesions. The adrenal glands and kidneys are unremarkable. There are tiny bilateral renal cysts and scarring changes involving the right kidney.  There is a small hiatal hernia noted. Stomach is unremarkable. The duodenum is slightly patulous and tortuous but no mass or obstruction. No mesenteric or retroperitoneal mass or adenopathy. Atherosclerotic  changes involving the aorta but no aneurysm.  The bony structures are unremarkable.  IMPRESSION: 1. Normal caliber and course of the common bile duct and pancreatic duct for age. No common bile duct stones. 2. Layering debris in the gallbladder likely combination of sludge and tiny gallstones. 3. Normal MR appearance of the pancreas 4. No abdominal mass or adenopathy.   Electronically Signed  By: Kalman Jewels M.D.   On: 04/30/2014 08:23    Microbiology: Recent Results (from the past 240 hour(s))  Urine culture     Status: None   Collection Time: 04/27/14  4:55 PM  Result Value Ref Range Status   Specimen Description URINE, CLEAN CATCH  Final   Special Requests Immunocompromised  Final   Colony Count NO GROWTH Performed at [facility]   Final   Culture NO GROWTH Performed at [facility]   Final   Report Status 04/28/2014 FINAL  Final  Stool culture     Status: None (Preliminary result)   Collection Time: 04/28/14  1:24 PM  Result Value Ref Range Status   Specimen Description STOOL  Final   Special Requests NONE  Final   Culture   Final    NO SUSPICIOUS COLONIES, CONTINUING TO HOLD Performed at [facility]    Report Status PENDING  Incomplete  Ova and parasite examination     Status: None   Collection Time: 04/28/14  1:24 PM  Result Value Ref Range Status   Specimen Description STOOL  Final   Special Requests NONE  Final   Ova and parasites   Final    NO OVA OR PARASITES SEEN MODERATE YEAST Performed at [facility]    Report Status 04/29/2014 FINAL  Final  Clostridium Difficile by PCR     Status: None   Collection Time: 04/28/14  8:05 PM  Result Value Ref Range Status   C difficile by pcr NEGATIVE NEGATIVE Final    Comment: Performed at [facility]: Basic Metabolic Panel:  Recent Labs Lab 04/27/14 1446 04/27/14 2230 04/28/14 0445 04/29/14 0523 04/30/14 0605 05/01/14 0454  NA 134* 131* 137 135 139 138  K 4.4  3.8 4.3 3.9 3.6 3.5  CL 108 102 110 110 112 113*  CO2 19 19 18* 15* 18* 17*  GLUCOSE 135* 116* 88 134* 118* 129*  BUN 65* 65* 63* 48* 37* 31*  CREATININE 4.31* 4.35* 4.02* 3.13* 2.67* 2.52*  CALCIUM 8.8 8.3* 8.6 8.0* 7.8* 7.7*  MG 2.5 2.4  --   --   --   --   PHOS  --  4.7*  --   --   --   --    Liver Function Tests:  Recent Labs Lab 04/27/14 1446 04/27/14 2230 04/28/14 0445 04/29/14 0523 04/30/14 0605  AST 78* 73* 95* 69* 40*  ALT 39* 37* 42* 45* 38*  ALKPHOS 54 49 50 75 68  BILITOT 2.3* 2.0* 1.7* 1.4* 0.8  PROT 6.8 6.2 5.8* 5.6* 5.6*  ALBUMIN 3.2* 2.9* 2.7* 2.6* 2.7*    Recent Labs Lab 04/29/14 0523 04/30/14 0605  LIPASE 30 37   No results for input(s): AMMONIA in the last 168 hours. CBC:  Recent Labs Lab 04/27/14 1446 04/28/14 0445 04/29/14 0523 04/30/14 0605 05/01/14 0454  WBC 10.5 8.6 8.3 7.8 8.6  NEUTROABS 8.2*  --   --   --   --   HGB 11.5* 10.1* 9.2* 9.3* 9.7*  HCT 36.2 31.8* 27.8* 29.1* 30.7*  MCV 99.2 99.1 98.2 99.0 99.4  PLT 293 268 275 312 308   Cardiac Enzymes: No results for input(s): CKTOTAL, CKMB, CKMBINDEX, TROPONINI in the last 168 hours. BNP: BNP (last 3 results)  Recent Labs  07/16/13 1030  PROBNP 26763.0*   CBG: No results for input(s): GLUCAP in the last 168 hours.     Signed:  Fallon Howerter  [facility] 05/01/2014, 10:05 AM

## 2014-05-02 LAB — STOOL CULTURE

## 2014-05-04 LAB — URINE CULTURE: Colony Count: >=100000

## 2014-05-05 ENCOUNTER — Other Ambulatory Visit: Payer: Self-pay | Admitting: Family Medicine

## 2014-05-05 ENCOUNTER — Telehealth: Payer: Self-pay

## 2014-05-05 NOTE — Telephone Encounter (Signed)
Admit date: 04/27/2014 Discharge date: 05/01/2014  Reason for admission:  Diarrhea and acute on chronic renal failure  Left a message for call back.

## 2014-05-06 ENCOUNTER — Encounter: Payer: Self-pay | Admitting: General Practice

## 2014-05-06 DIAGNOSIS — I5032 Chronic diastolic (congestive) heart failure: Secondary | ICD-10-CM | POA: Diagnosis not present

## 2014-05-06 DIAGNOSIS — J439 Emphysema, unspecified: Secondary | ICD-10-CM | POA: Diagnosis not present

## 2014-05-06 DIAGNOSIS — E039 Hypothyroidism, unspecified: Secondary | ICD-10-CM | POA: Diagnosis not present

## 2014-05-06 DIAGNOSIS — Z7982 Long term (current) use of aspirin: Secondary | ICD-10-CM | POA: Diagnosis not present

## 2014-05-06 DIAGNOSIS — Z942 Lung transplant status: Secondary | ICD-10-CM | POA: Diagnosis not present

## 2014-05-06 DIAGNOSIS — K219 Gastro-esophageal reflux disease without esophagitis: Secondary | ICD-10-CM | POA: Diagnosis not present

## 2014-05-06 DIAGNOSIS — N184 Chronic kidney disease, stage 4 (severe): Secondary | ICD-10-CM | POA: Diagnosis not present

## 2014-05-06 DIAGNOSIS — Z9181 History of falling: Secondary | ICD-10-CM | POA: Diagnosis not present

## 2014-05-06 DIAGNOSIS — Z7952 Long term (current) use of systemic steroids: Secondary | ICD-10-CM | POA: Diagnosis not present

## 2014-05-06 DIAGNOSIS — T86819 Unspecified complication of lung transplant: Secondary | ICD-10-CM | POA: Diagnosis not present

## 2014-05-06 DIAGNOSIS — Z418 Encounter for other procedures for purposes other than remedying health state: Secondary | ICD-10-CM | POA: Diagnosis not present

## 2014-05-06 DIAGNOSIS — I129 Hypertensive chronic kidney disease with stage 1 through stage 4 chronic kidney disease, or unspecified chronic kidney disease: Secondary | ICD-10-CM | POA: Diagnosis not present

## 2014-05-07 DIAGNOSIS — E039 Hypothyroidism, unspecified: Secondary | ICD-10-CM | POA: Diagnosis not present

## 2014-05-07 DIAGNOSIS — Z942 Lung transplant status: Secondary | ICD-10-CM | POA: Diagnosis not present

## 2014-05-07 DIAGNOSIS — K219 Gastro-esophageal reflux disease without esophagitis: Secondary | ICD-10-CM | POA: Diagnosis not present

## 2014-05-07 DIAGNOSIS — Z9181 History of falling: Secondary | ICD-10-CM | POA: Diagnosis not present

## 2014-05-07 DIAGNOSIS — Z7952 Long term (current) use of systemic steroids: Secondary | ICD-10-CM | POA: Diagnosis not present

## 2014-05-07 DIAGNOSIS — I5032 Chronic diastolic (congestive) heart failure: Secondary | ICD-10-CM | POA: Diagnosis not present

## 2014-05-07 DIAGNOSIS — I129 Hypertensive chronic kidney disease with stage 1 through stage 4 chronic kidney disease, or unspecified chronic kidney disease: Secondary | ICD-10-CM | POA: Diagnosis not present

## 2014-05-07 DIAGNOSIS — Z7982 Long term (current) use of aspirin: Secondary | ICD-10-CM | POA: Diagnosis not present

## 2014-05-07 DIAGNOSIS — J439 Emphysema, unspecified: Secondary | ICD-10-CM | POA: Diagnosis not present

## 2014-05-07 DIAGNOSIS — N184 Chronic kidney disease, stage 4 (severe): Secondary | ICD-10-CM | POA: Diagnosis not present

## 2014-05-12 ENCOUNTER — Telehealth: Payer: Self-pay | Admitting: General Practice

## 2014-05-12 DIAGNOSIS — K219 Gastro-esophageal reflux disease without esophagitis: Secondary | ICD-10-CM | POA: Diagnosis not present

## 2014-05-12 DIAGNOSIS — E039 Hypothyroidism, unspecified: Secondary | ICD-10-CM | POA: Diagnosis not present

## 2014-05-12 DIAGNOSIS — Z9181 History of falling: Secondary | ICD-10-CM | POA: Diagnosis not present

## 2014-05-12 DIAGNOSIS — I5032 Chronic diastolic (congestive) heart failure: Secondary | ICD-10-CM | POA: Diagnosis not present

## 2014-05-12 DIAGNOSIS — N390 Urinary tract infection, site not specified: Secondary | ICD-10-CM | POA: Diagnosis not present

## 2014-05-12 DIAGNOSIS — Z942 Lung transplant status: Secondary | ICD-10-CM | POA: Diagnosis not present

## 2014-05-12 DIAGNOSIS — J439 Emphysema, unspecified: Secondary | ICD-10-CM | POA: Diagnosis not present

## 2014-05-12 DIAGNOSIS — N184 Chronic kidney disease, stage 4 (severe): Secondary | ICD-10-CM | POA: Diagnosis not present

## 2014-05-12 DIAGNOSIS — I129 Hypertensive chronic kidney disease with stage 1 through stage 4 chronic kidney disease, or unspecified chronic kidney disease: Secondary | ICD-10-CM | POA: Diagnosis not present

## 2014-05-12 DIAGNOSIS — Z418 Encounter for other procedures for purposes other than remedying health state: Secondary | ICD-10-CM | POA: Diagnosis not present

## 2014-05-12 DIAGNOSIS — Z7982 Long term (current) use of aspirin: Secondary | ICD-10-CM | POA: Diagnosis not present

## 2014-05-12 DIAGNOSIS — Z79899 Other long term (current) drug therapy: Secondary | ICD-10-CM | POA: Diagnosis not present

## 2014-05-12 DIAGNOSIS — R7989 Other specified abnormal findings of blood chemistry: Secondary | ICD-10-CM | POA: Diagnosis not present

## 2014-05-12 DIAGNOSIS — Z7952 Long term (current) use of systemic steroids: Secondary | ICD-10-CM | POA: Diagnosis not present

## 2014-05-12 DIAGNOSIS — T86819 Unspecified complication of lung transplant: Secondary | ICD-10-CM | POA: Diagnosis not present

## 2014-05-12 NOTE — Telephone Encounter (Signed)
Once we have repeat BP we can determine if meds are needed

## 2014-05-12 NOTE — Telephone Encounter (Signed)
Spoke with Home Nurse with [name], notified of lab results she will collect urine and send for culture. Orders typed and faxed.    [name] advised that pt has bilateral BP of 190/102. States that pt does not have any HA or dizziness, please advise.

## 2014-05-12 NOTE — Telephone Encounter (Signed)
Repeat BP in 30 minutes (about now) and please get pulse rate so I can determine which meds would be appropriate

## 2014-05-12 NOTE — Telephone Encounter (Signed)
Advised nurse, she stated at the time the BP was taken her pulse was 77. Asked [name] to take another Bp and pulse for me advised she would have to do it later. She was in with another pt.    Orders faxed for UA and culture to [phone]

## 2014-05-13 LAB — TSH: TSH: 6.35 u[IU]/mL — AB (ref <=5.90)

## 2014-05-14 DIAGNOSIS — J439 Emphysema, unspecified: Secondary | ICD-10-CM | POA: Diagnosis not present

## 2014-05-14 DIAGNOSIS — Z7952 Long term (current) use of systemic steroids: Secondary | ICD-10-CM | POA: Diagnosis not present

## 2014-05-14 DIAGNOSIS — Z942 Lung transplant status: Secondary | ICD-10-CM | POA: Diagnosis not present

## 2014-05-14 DIAGNOSIS — I5032 Chronic diastolic (congestive) heart failure: Secondary | ICD-10-CM | POA: Diagnosis not present

## 2014-05-14 DIAGNOSIS — E039 Hypothyroidism, unspecified: Secondary | ICD-10-CM | POA: Diagnosis not present

## 2014-05-14 DIAGNOSIS — Z7982 Long term (current) use of aspirin: Secondary | ICD-10-CM | POA: Diagnosis not present

## 2014-05-14 DIAGNOSIS — Z9181 History of falling: Secondary | ICD-10-CM | POA: Diagnosis not present

## 2014-05-14 DIAGNOSIS — N184 Chronic kidney disease, stage 4 (severe): Secondary | ICD-10-CM | POA: Diagnosis not present

## 2014-05-14 DIAGNOSIS — I129 Hypertensive chronic kidney disease with stage 1 through stage 4 chronic kidney disease, or unspecified chronic kidney disease: Secondary | ICD-10-CM | POA: Diagnosis not present

## 2014-05-14 DIAGNOSIS — K219 Gastro-esophageal reflux disease without esophagitis: Secondary | ICD-10-CM | POA: Diagnosis not present

## 2014-05-16 DIAGNOSIS — Z7952 Long term (current) use of systemic steroids: Secondary | ICD-10-CM | POA: Diagnosis not present

## 2014-05-16 DIAGNOSIS — K219 Gastro-esophageal reflux disease without esophagitis: Secondary | ICD-10-CM | POA: Diagnosis not present

## 2014-05-16 DIAGNOSIS — Z942 Lung transplant status: Secondary | ICD-10-CM | POA: Diagnosis not present

## 2014-05-16 DIAGNOSIS — N184 Chronic kidney disease, stage 4 (severe): Secondary | ICD-10-CM | POA: Diagnosis not present

## 2014-05-16 DIAGNOSIS — E039 Hypothyroidism, unspecified: Secondary | ICD-10-CM | POA: Diagnosis not present

## 2014-05-16 DIAGNOSIS — Z7982 Long term (current) use of aspirin: Secondary | ICD-10-CM | POA: Diagnosis not present

## 2014-05-16 DIAGNOSIS — J439 Emphysema, unspecified: Secondary | ICD-10-CM | POA: Diagnosis not present

## 2014-05-16 DIAGNOSIS — I5032 Chronic diastolic (congestive) heart failure: Secondary | ICD-10-CM | POA: Diagnosis not present

## 2014-05-16 DIAGNOSIS — Z9181 History of falling: Secondary | ICD-10-CM | POA: Diagnosis not present

## 2014-05-16 DIAGNOSIS — I129 Hypertensive chronic kidney disease with stage 1 through stage 4 chronic kidney disease, or unspecified chronic kidney disease: Secondary | ICD-10-CM | POA: Diagnosis not present

## 2014-05-18 ENCOUNTER — Telehealth: Payer: Self-pay | Admitting: General Practice

## 2014-05-18 DIAGNOSIS — K219 Gastro-esophageal reflux disease without esophagitis: Secondary | ICD-10-CM | POA: Diagnosis not present

## 2014-05-18 DIAGNOSIS — J439 Emphysema, unspecified: Secondary | ICD-10-CM | POA: Diagnosis not present

## 2014-05-18 DIAGNOSIS — Z9181 History of falling: Secondary | ICD-10-CM | POA: Diagnosis not present

## 2014-05-18 DIAGNOSIS — Z7982 Long term (current) use of aspirin: Secondary | ICD-10-CM | POA: Diagnosis not present

## 2014-05-18 DIAGNOSIS — N184 Chronic kidney disease, stage 4 (severe): Secondary | ICD-10-CM | POA: Diagnosis not present

## 2014-05-18 DIAGNOSIS — Z942 Lung transplant status: Secondary | ICD-10-CM | POA: Diagnosis not present

## 2014-05-18 DIAGNOSIS — Z418 Encounter for other procedures for purposes other than remedying health state: Secondary | ICD-10-CM | POA: Diagnosis not present

## 2014-05-18 DIAGNOSIS — T86819 Unspecified complication of lung transplant: Secondary | ICD-10-CM | POA: Diagnosis not present

## 2014-05-18 DIAGNOSIS — Z7952 Long term (current) use of systemic steroids: Secondary | ICD-10-CM | POA: Diagnosis not present

## 2014-05-18 DIAGNOSIS — E039 Hypothyroidism, unspecified: Secondary | ICD-10-CM | POA: Diagnosis not present

## 2014-05-18 DIAGNOSIS — I129 Hypertensive chronic kidney disease with stage 1 through stage 4 chronic kidney disease, or unspecified chronic kidney disease: Secondary | ICD-10-CM | POA: Diagnosis not present

## 2014-05-18 DIAGNOSIS — I5032 Chronic diastolic (congestive) heart failure: Secondary | ICD-10-CM | POA: Diagnosis not present

## 2014-05-18 DIAGNOSIS — N390 Urinary tract infection, site not specified: Secondary | ICD-10-CM | POA: Diagnosis not present

## 2014-05-18 DIAGNOSIS — R7989 Other specified abnormal findings of blood chemistry: Secondary | ICD-10-CM | POA: Diagnosis not present

## 2014-05-18 DIAGNOSIS — Z79899 Other long term (current) drug therapy: Secondary | ICD-10-CM | POA: Diagnosis not present

## 2014-05-18 NOTE — Telephone Encounter (Signed)
Called pt daughter in regards to recent lab results received from [facility], and lmovm to return call:   WBC count is elevated- still waiting on urine culture Creatnine is elevated- needs to see nephrology Hemoglobin is decreasing- need to repeat cbc/bmp in 1-2 weeks  When does pt have a follow up appointment? Who is she seeing to treat symptoms? Needs to see [place] nephrology at a minimum

## 2014-05-19 ENCOUNTER — Other Ambulatory Visit: Payer: Self-pay | Admitting: General Practice

## 2014-05-19 ENCOUNTER — Telehealth: Payer: Self-pay | Admitting: Family Medicine

## 2014-05-19 DIAGNOSIS — E039 Hypothyroidism, unspecified: Secondary | ICD-10-CM | POA: Diagnosis not present

## 2014-05-19 DIAGNOSIS — Z9181 History of falling: Secondary | ICD-10-CM | POA: Diagnosis not present

## 2014-05-19 DIAGNOSIS — N184 Chronic kidney disease, stage 4 (severe): Secondary | ICD-10-CM | POA: Diagnosis not present

## 2014-05-19 DIAGNOSIS — Z942 Lung transplant status: Secondary | ICD-10-CM | POA: Diagnosis not present

## 2014-05-19 DIAGNOSIS — Z7982 Long term (current) use of aspirin: Secondary | ICD-10-CM | POA: Diagnosis not present

## 2014-05-19 DIAGNOSIS — Z7952 Long term (current) use of systemic steroids: Secondary | ICD-10-CM | POA: Diagnosis not present

## 2014-05-19 DIAGNOSIS — J439 Emphysema, unspecified: Secondary | ICD-10-CM | POA: Diagnosis not present

## 2014-05-19 DIAGNOSIS — I129 Hypertensive chronic kidney disease with stage 1 through stage 4 chronic kidney disease, or unspecified chronic kidney disease: Secondary | ICD-10-CM | POA: Diagnosis not present

## 2014-05-19 DIAGNOSIS — K219 Gastro-esophageal reflux disease without esophagitis: Secondary | ICD-10-CM | POA: Diagnosis not present

## 2014-05-19 DIAGNOSIS — I5032 Chronic diastolic (congestive) heart failure: Secondary | ICD-10-CM | POA: Diagnosis not present

## 2014-05-19 MED ORDER — CEPHALEXIN 250 MG PO CAPS: 250.0000 mg | ORAL_CAPSULE | Freq: Three times a day (TID) | ORAL | Status: DC

## 2014-05-19 NOTE — Telephone Encounter (Signed)
Caller name:[name] Relationship to patient:daughter Can be reached: [phone]   Reason for call: Returning call for antibiotic for mother-

## 2014-05-19 NOTE — Telephone Encounter (Signed)
Agree  Thank you

## 2014-05-19 NOTE — Telephone Encounter (Signed)
Called pt and both daughters. Finally spoke to christine who ok'd the medication to be sent to local pharmacy in [place]. Med filled and daughter notified that there was a less than 10% chance of an allergic reaction due to the pt having an allergy to penicillin. Pt daughter notified to have pt begin claritin or zyrtec in addition to this antibiotic. Also advised that if any signs of swelling or rash pt would need to seek treatment at ED for IV antibiotic, to further treat urine infection.   Also faxed urine culture results to [facility] and LM for Dr. Graylon Good in regards to this matter.

## 2014-05-20 ENCOUNTER — Telehealth: Payer: Self-pay | Admitting: General Practice

## 2014-05-20 MED ORDER — LEVOTHYROXINE SODIUM 100 MCG PO TABS: 100.0000 ug | ORAL_TABLET | Freq: Every day | ORAL | Status: DC

## 2014-05-20 NOTE — Telephone Encounter (Signed)
Pt daughter notified   

## 2014-05-20 NOTE — Telephone Encounter (Signed)
Received a fax from [facility] in regards to pt TSH: 6.352. Per [name] pt is to increase levothyroxine to 159mcg daily. New Rx sent to pharmacy in [place] as requested by pt daughter. TSH is to be rechecked in 1 month

## 2014-05-20 NOTE — Telephone Encounter (Signed)
Pt daughter called back and labs discussed.

## 2014-05-25 ENCOUNTER — Telehealth: Payer: Self-pay | Admitting: Family Medicine

## 2014-05-25 DIAGNOSIS — E039 Hypothyroidism, unspecified: Secondary | ICD-10-CM | POA: Diagnosis not present

## 2014-05-25 DIAGNOSIS — N390 Urinary tract infection, site not specified: Secondary | ICD-10-CM | POA: Diagnosis not present

## 2014-05-25 DIAGNOSIS — K219 Gastro-esophageal reflux disease without esophagitis: Secondary | ICD-10-CM | POA: Diagnosis not present

## 2014-05-25 DIAGNOSIS — I129 Hypertensive chronic kidney disease with stage 1 through stage 4 chronic kidney disease, or unspecified chronic kidney disease: Secondary | ICD-10-CM | POA: Diagnosis not present

## 2014-05-25 DIAGNOSIS — T86819 Unspecified complication of lung transplant: Secondary | ICD-10-CM | POA: Diagnosis not present

## 2014-05-25 DIAGNOSIS — J439 Emphysema, unspecified: Secondary | ICD-10-CM | POA: Diagnosis not present

## 2014-05-25 DIAGNOSIS — I5032 Chronic diastolic (congestive) heart failure: Secondary | ICD-10-CM | POA: Diagnosis not present

## 2014-05-25 DIAGNOSIS — Z7982 Long term (current) use of aspirin: Secondary | ICD-10-CM | POA: Diagnosis not present

## 2014-05-25 DIAGNOSIS — Z942 Lung transplant status: Secondary | ICD-10-CM | POA: Diagnosis not present

## 2014-05-25 DIAGNOSIS — Z418 Encounter for other procedures for purposes other than remedying health state: Secondary | ICD-10-CM | POA: Diagnosis not present

## 2014-05-25 DIAGNOSIS — N184 Chronic kidney disease, stage 4 (severe): Secondary | ICD-10-CM | POA: Diagnosis not present

## 2014-05-25 DIAGNOSIS — Z79899 Other long term (current) drug therapy: Secondary | ICD-10-CM | POA: Diagnosis not present

## 2014-05-25 DIAGNOSIS — Z7952 Long term (current) use of systemic steroids: Secondary | ICD-10-CM | POA: Diagnosis not present

## 2014-05-25 DIAGNOSIS — R7989 Other specified abnormal findings of blood chemistry: Secondary | ICD-10-CM | POA: Diagnosis not present

## 2014-05-25 DIAGNOSIS — Z9181 History of falling: Secondary | ICD-10-CM | POA: Diagnosis not present

## 2014-05-25 MED ORDER — AMLODIPINE BESYLATE 2.5 MG PO TABS: 2.5000 mg | ORAL_TABLET | Freq: Every day | ORAL | Status: DC

## 2014-05-25 NOTE — Telephone Encounter (Signed)
Med filled and pt daughter notified. Advised that if pt becomes symptomatic then she needs to go to the ER.

## 2014-05-25 NOTE — Telephone Encounter (Signed)
Pt daughter notified that the reason we did not increase the Coreg due to her pulse being so low. We are trying to treat the BP readings without making the pt pulse to low.

## 2014-05-25 NOTE — Telephone Encounter (Signed)
Please call pt and see when she has f/u scheduled- either here or at [facility].  She cannot sit at home w/ BP #s that high.  She really does need an evaluation w/ #s like that.  In the meantime, she can start Amlodipine 2.5mg  daily

## 2014-05-25 NOTE — Telephone Encounter (Signed)
Caller name: Bess Kinds from [place] Relation to pt: Call back number: [phone] Pharmacy:  Reason for call:   Reporting elevated bp readings from this morning visit.   240/136 Repeat 230/130 Heart rate low 70s Patient denies SOB and chest pain.

## 2014-05-25 NOTE — Telephone Encounter (Signed)
Patient daughter, [name] called back wanting to know instead of putting patient on a new bp medication if Dr. [name] could increase coreg and imdur that patient is already on? Best # [phone]

## 2014-05-28 DIAGNOSIS — E785 Hyperlipidemia, unspecified: Secondary | ICD-10-CM | POA: Diagnosis not present

## 2014-05-28 DIAGNOSIS — I10 Essential (primary) hypertension: Secondary | ICD-10-CM | POA: Diagnosis not present

## 2014-05-28 DIAGNOSIS — N184 Chronic kidney disease, stage 4 (severe): Secondary | ICD-10-CM | POA: Diagnosis not present

## 2014-05-28 DIAGNOSIS — J439 Emphysema, unspecified: Secondary | ICD-10-CM | POA: Diagnosis not present

## 2014-06-03 ENCOUNTER — Telehealth: Payer: Self-pay

## 2014-06-03 DIAGNOSIS — I5032 Chronic diastolic (congestive) heart failure: Secondary | ICD-10-CM | POA: Diagnosis not present

## 2014-06-03 NOTE — Telephone Encounter (Signed)
Home Health Certification and Plan of Care received via fax from [place].  Forms numbered and billing sheet attached. Forms and billing sheet placed in Dr. Virgil Benedict red folder for review and signature.

## 2014-06-04 DIAGNOSIS — I129 Hypertensive chronic kidney disease with stage 1 through stage 4 chronic kidney disease, or unspecified chronic kidney disease: Secondary | ICD-10-CM | POA: Diagnosis not present

## 2014-06-04 DIAGNOSIS — R7989 Other specified abnormal findings of blood chemistry: Secondary | ICD-10-CM | POA: Diagnosis not present

## 2014-06-04 DIAGNOSIS — Z7982 Long term (current) use of aspirin: Secondary | ICD-10-CM | POA: Diagnosis not present

## 2014-06-04 DIAGNOSIS — N390 Urinary tract infection, site not specified: Secondary | ICD-10-CM | POA: Diagnosis not present

## 2014-06-04 DIAGNOSIS — Z418 Encounter for other procedures for purposes other than remedying health state: Secondary | ICD-10-CM | POA: Diagnosis not present

## 2014-06-04 DIAGNOSIS — I5032 Chronic diastolic (congestive) heart failure: Secondary | ICD-10-CM | POA: Diagnosis not present

## 2014-06-04 DIAGNOSIS — Z9181 History of falling: Secondary | ICD-10-CM | POA: Diagnosis not present

## 2014-06-04 DIAGNOSIS — Z79899 Other long term (current) drug therapy: Secondary | ICD-10-CM | POA: Diagnosis not present

## 2014-06-04 DIAGNOSIS — Z942 Lung transplant status: Secondary | ICD-10-CM | POA: Diagnosis not present

## 2014-06-04 DIAGNOSIS — J439 Emphysema, unspecified: Secondary | ICD-10-CM | POA: Diagnosis not present

## 2014-06-04 DIAGNOSIS — Z7952 Long term (current) use of systemic steroids: Secondary | ICD-10-CM | POA: Diagnosis not present

## 2014-06-04 DIAGNOSIS — T86819 Unspecified complication of lung transplant: Secondary | ICD-10-CM | POA: Diagnosis not present

## 2014-06-04 DIAGNOSIS — K219 Gastro-esophageal reflux disease without esophagitis: Secondary | ICD-10-CM | POA: Diagnosis not present

## 2014-06-04 DIAGNOSIS — N184 Chronic kidney disease, stage 4 (severe): Secondary | ICD-10-CM | POA: Diagnosis not present

## 2014-06-04 DIAGNOSIS — E039 Hypothyroidism, unspecified: Secondary | ICD-10-CM | POA: Diagnosis not present

## 2014-06-08 ENCOUNTER — Encounter: Payer: Self-pay | Admitting: Family Medicine

## 2014-06-09 NOTE — Telephone Encounter (Signed)
Signed documents faxed. JG//CMA

## 2014-06-11 DIAGNOSIS — Z418 Encounter for other procedures for purposes other than remedying health state: Secondary | ICD-10-CM | POA: Diagnosis not present

## 2014-06-11 DIAGNOSIS — N184 Chronic kidney disease, stage 4 (severe): Secondary | ICD-10-CM | POA: Diagnosis not present

## 2014-06-11 DIAGNOSIS — I5032 Chronic diastolic (congestive) heart failure: Secondary | ICD-10-CM | POA: Diagnosis not present

## 2014-06-11 DIAGNOSIS — Z79899 Other long term (current) drug therapy: Secondary | ICD-10-CM | POA: Diagnosis not present

## 2014-06-11 DIAGNOSIS — K219 Gastro-esophageal reflux disease without esophagitis: Secondary | ICD-10-CM | POA: Diagnosis not present

## 2014-06-11 DIAGNOSIS — Z9181 History of falling: Secondary | ICD-10-CM | POA: Diagnosis not present

## 2014-06-11 DIAGNOSIS — R7989 Other specified abnormal findings of blood chemistry: Secondary | ICD-10-CM | POA: Diagnosis not present

## 2014-06-11 DIAGNOSIS — Z942 Lung transplant status: Secondary | ICD-10-CM | POA: Diagnosis not present

## 2014-06-11 DIAGNOSIS — J439 Emphysema, unspecified: Secondary | ICD-10-CM | POA: Diagnosis not present

## 2014-06-11 DIAGNOSIS — I129 Hypertensive chronic kidney disease with stage 1 through stage 4 chronic kidney disease, or unspecified chronic kidney disease: Secondary | ICD-10-CM | POA: Diagnosis not present

## 2014-06-11 DIAGNOSIS — Z7982 Long term (current) use of aspirin: Secondary | ICD-10-CM | POA: Diagnosis not present

## 2014-06-11 DIAGNOSIS — T86819 Unspecified complication of lung transplant: Secondary | ICD-10-CM | POA: Diagnosis not present

## 2014-06-11 DIAGNOSIS — Z7952 Long term (current) use of systemic steroids: Secondary | ICD-10-CM | POA: Diagnosis not present

## 2014-06-11 DIAGNOSIS — E039 Hypothyroidism, unspecified: Secondary | ICD-10-CM | POA: Diagnosis not present

## 2014-06-11 DIAGNOSIS — N390 Urinary tract infection, site not specified: Secondary | ICD-10-CM | POA: Diagnosis not present

## 2014-06-14 ENCOUNTER — Other Ambulatory Visit: Payer: Self-pay | Admitting: Family Medicine

## 2014-06-15 NOTE — Telephone Encounter (Signed)
Med filled.  

## 2014-06-18 DIAGNOSIS — I129 Hypertensive chronic kidney disease with stage 1 through stage 4 chronic kidney disease, or unspecified chronic kidney disease: Secondary | ICD-10-CM | POA: Diagnosis not present

## 2014-06-18 DIAGNOSIS — T86819 Unspecified complication of lung transplant: Secondary | ICD-10-CM | POA: Diagnosis not present

## 2014-06-18 DIAGNOSIS — R7989 Other specified abnormal findings of blood chemistry: Secondary | ICD-10-CM | POA: Diagnosis not present

## 2014-06-18 DIAGNOSIS — Z9181 History of falling: Secondary | ICD-10-CM | POA: Diagnosis not present

## 2014-06-18 DIAGNOSIS — K219 Gastro-esophageal reflux disease without esophagitis: Secondary | ICD-10-CM | POA: Diagnosis not present

## 2014-06-18 DIAGNOSIS — Z418 Encounter for other procedures for purposes other than remedying health state: Secondary | ICD-10-CM | POA: Diagnosis not present

## 2014-06-18 DIAGNOSIS — Z7952 Long term (current) use of systemic steroids: Secondary | ICD-10-CM | POA: Diagnosis not present

## 2014-06-18 DIAGNOSIS — I5032 Chronic diastolic (congestive) heart failure: Secondary | ICD-10-CM | POA: Diagnosis not present

## 2014-06-18 DIAGNOSIS — Z942 Lung transplant status: Secondary | ICD-10-CM | POA: Diagnosis not present

## 2014-06-18 DIAGNOSIS — N184 Chronic kidney disease, stage 4 (severe): Secondary | ICD-10-CM | POA: Diagnosis not present

## 2014-06-18 DIAGNOSIS — J439 Emphysema, unspecified: Secondary | ICD-10-CM | POA: Diagnosis not present

## 2014-06-18 DIAGNOSIS — Z7982 Long term (current) use of aspirin: Secondary | ICD-10-CM | POA: Diagnosis not present

## 2014-06-18 DIAGNOSIS — N390 Urinary tract infection, site not specified: Secondary | ICD-10-CM | POA: Diagnosis not present

## 2014-06-18 DIAGNOSIS — E039 Hypothyroidism, unspecified: Secondary | ICD-10-CM | POA: Diagnosis not present

## 2014-06-18 DIAGNOSIS — Z79899 Other long term (current) drug therapy: Secondary | ICD-10-CM | POA: Diagnosis not present

## 2014-06-25 DIAGNOSIS — I129 Hypertensive chronic kidney disease with stage 1 through stage 4 chronic kidney disease, or unspecified chronic kidney disease: Secondary | ICD-10-CM | POA: Diagnosis not present

## 2014-06-25 DIAGNOSIS — J439 Emphysema, unspecified: Secondary | ICD-10-CM | POA: Diagnosis not present

## 2014-06-25 DIAGNOSIS — Z942 Lung transplant status: Secondary | ICD-10-CM | POA: Diagnosis not present

## 2014-06-25 DIAGNOSIS — T86819 Unspecified complication of lung transplant: Secondary | ICD-10-CM | POA: Diagnosis not present

## 2014-06-25 DIAGNOSIS — Z7952 Long term (current) use of systemic steroids: Secondary | ICD-10-CM | POA: Diagnosis not present

## 2014-06-25 DIAGNOSIS — I5032 Chronic diastolic (congestive) heart failure: Secondary | ICD-10-CM | POA: Diagnosis not present

## 2014-06-25 DIAGNOSIS — Z7982 Long term (current) use of aspirin: Secondary | ICD-10-CM | POA: Diagnosis not present

## 2014-06-25 DIAGNOSIS — Z79899 Other long term (current) drug therapy: Secondary | ICD-10-CM | POA: Diagnosis not present

## 2014-06-25 DIAGNOSIS — N184 Chronic kidney disease, stage 4 (severe): Secondary | ICD-10-CM | POA: Diagnosis not present

## 2014-06-25 DIAGNOSIS — Z9181 History of falling: Secondary | ICD-10-CM | POA: Diagnosis not present

## 2014-06-25 DIAGNOSIS — E039 Hypothyroidism, unspecified: Secondary | ICD-10-CM | POA: Diagnosis not present

## 2014-06-25 DIAGNOSIS — R7989 Other specified abnormal findings of blood chemistry: Secondary | ICD-10-CM | POA: Diagnosis not present

## 2014-06-25 DIAGNOSIS — N390 Urinary tract infection, site not specified: Secondary | ICD-10-CM | POA: Diagnosis not present

## 2014-06-25 DIAGNOSIS — K219 Gastro-esophageal reflux disease without esophagitis: Secondary | ICD-10-CM | POA: Diagnosis not present

## 2014-06-25 DIAGNOSIS — Z418 Encounter for other procedures for purposes other than remedying health state: Secondary | ICD-10-CM | POA: Diagnosis not present

## 2014-07-02 DIAGNOSIS — K219 Gastro-esophageal reflux disease without esophagitis: Secondary | ICD-10-CM | POA: Diagnosis not present

## 2014-07-02 DIAGNOSIS — Z7982 Long term (current) use of aspirin: Secondary | ICD-10-CM | POA: Diagnosis not present

## 2014-07-02 DIAGNOSIS — Z79899 Other long term (current) drug therapy: Secondary | ICD-10-CM | POA: Diagnosis not present

## 2014-07-02 DIAGNOSIS — T86819 Unspecified complication of lung transplant: Secondary | ICD-10-CM | POA: Diagnosis not present

## 2014-07-02 DIAGNOSIS — N184 Chronic kidney disease, stage 4 (severe): Secondary | ICD-10-CM | POA: Diagnosis not present

## 2014-07-02 DIAGNOSIS — Z942 Lung transplant status: Secondary | ICD-10-CM | POA: Diagnosis not present

## 2014-07-02 DIAGNOSIS — N390 Urinary tract infection, site not specified: Secondary | ICD-10-CM | POA: Diagnosis not present

## 2014-07-02 DIAGNOSIS — E039 Hypothyroidism, unspecified: Secondary | ICD-10-CM | POA: Diagnosis not present

## 2014-07-02 DIAGNOSIS — Z418 Encounter for other procedures for purposes other than remedying health state: Secondary | ICD-10-CM | POA: Diagnosis not present

## 2014-07-02 DIAGNOSIS — I5032 Chronic diastolic (congestive) heart failure: Secondary | ICD-10-CM | POA: Diagnosis not present

## 2014-07-02 DIAGNOSIS — R7989 Other specified abnormal findings of blood chemistry: Secondary | ICD-10-CM | POA: Diagnosis not present

## 2014-07-02 DIAGNOSIS — I129 Hypertensive chronic kidney disease with stage 1 through stage 4 chronic kidney disease, or unspecified chronic kidney disease: Secondary | ICD-10-CM | POA: Diagnosis not present

## 2014-07-02 DIAGNOSIS — Z9181 History of falling: Secondary | ICD-10-CM | POA: Diagnosis not present

## 2014-07-02 DIAGNOSIS — Z7952 Long term (current) use of systemic steroids: Secondary | ICD-10-CM | POA: Diagnosis not present

## 2014-07-02 DIAGNOSIS — J439 Emphysema, unspecified: Secondary | ICD-10-CM | POA: Diagnosis not present

## 2014-07-07 DIAGNOSIS — E785 Hyperlipidemia, unspecified: Secondary | ICD-10-CM | POA: Diagnosis not present

## 2014-07-07 DIAGNOSIS — I10 Essential (primary) hypertension: Secondary | ICD-10-CM | POA: Diagnosis not present

## 2014-07-07 DIAGNOSIS — N184 Chronic kidney disease, stage 4 (severe): Secondary | ICD-10-CM | POA: Diagnosis not present

## 2014-07-07 DIAGNOSIS — N189 Chronic kidney disease, unspecified: Secondary | ICD-10-CM | POA: Diagnosis not present

## 2014-07-07 DIAGNOSIS — I5030 Unspecified diastolic (congestive) heart failure: Secondary | ICD-10-CM | POA: Diagnosis not present

## 2014-07-07 DIAGNOSIS — D899 Disorder involving the immune mechanism, unspecified: Secondary | ICD-10-CM | POA: Diagnosis not present

## 2014-07-07 DIAGNOSIS — T86819 Unspecified complication of lung transplant: Secondary | ICD-10-CM | POA: Diagnosis not present

## 2014-07-07 DIAGNOSIS — E039 Hypothyroidism, unspecified: Secondary | ICD-10-CM | POA: Diagnosis not present

## 2014-07-07 DIAGNOSIS — I130 Hypertensive heart and chronic kidney disease with heart failure and stage 1 through stage 4 chronic kidney disease, or unspecified chronic kidney disease: Secondary | ICD-10-CM | POA: Diagnosis not present

## 2014-07-07 DIAGNOSIS — N179 Acute kidney failure, unspecified: Secondary | ICD-10-CM | POA: Diagnosis not present

## 2014-07-07 DIAGNOSIS — R918 Other nonspecific abnormal finding of lung field: Secondary | ICD-10-CM | POA: Diagnosis not present

## 2014-07-07 DIAGNOSIS — Z942 Lung transplant status: Secondary | ICD-10-CM | POA: Diagnosis not present

## 2014-07-07 DIAGNOSIS — R197 Diarrhea, unspecified: Secondary | ICD-10-CM | POA: Diagnosis not present

## 2014-07-07 DIAGNOSIS — F329 Major depressive disorder, single episode, unspecified: Secondary | ICD-10-CM | POA: Diagnosis not present

## 2014-07-07 DIAGNOSIS — M7591 Shoulder lesion, unspecified, right shoulder: Secondary | ICD-10-CM | POA: Diagnosis not present

## 2014-07-07 LAB — PULMONARY FUNCTION TEST

## 2014-07-20 ENCOUNTER — Other Ambulatory Visit (HOSPITAL_COMMUNITY): Payer: Medicare Other

## 2014-07-20 DIAGNOSIS — L989 Disorder of the skin and subcutaneous tissue, unspecified: Secondary | ICD-10-CM | POA: Diagnosis not present

## 2014-07-21 ENCOUNTER — Telehealth: Payer: Self-pay | Admitting: *Deleted

## 2014-07-21 DIAGNOSIS — R942 Abnormal results of pulmonary function studies: Secondary | ICD-10-CM | POA: Diagnosis not present

## 2014-07-21 NOTE — Telephone Encounter (Signed)
Home health certification and plan of care received via fax from [place]. Forwarded to Dr. [name]. JG//CMA

## 2014-07-22 NOTE — Telephone Encounter (Signed)
Signed forms faxed to [place] at [phone]. Sent for scanning. JG//CMA

## 2014-07-24 DIAGNOSIS — F0390 Unspecified dementia without behavioral disturbance: Secondary | ICD-10-CM | POA: Diagnosis not present

## 2014-07-24 DIAGNOSIS — F0280 Dementia in other diseases classified elsewhere without behavioral disturbance: Secondary | ICD-10-CM | POA: Diagnosis not present

## 2014-07-27 DIAGNOSIS — N390 Urinary tract infection, site not specified: Secondary | ICD-10-CM | POA: Diagnosis not present

## 2014-07-27 DIAGNOSIS — I129 Hypertensive chronic kidney disease with stage 1 through stage 4 chronic kidney disease, or unspecified chronic kidney disease: Secondary | ICD-10-CM | POA: Diagnosis not present

## 2014-07-27 DIAGNOSIS — Z942 Lung transplant status: Secondary | ICD-10-CM | POA: Diagnosis not present

## 2014-07-27 DIAGNOSIS — T86819 Unspecified complication of lung transplant: Secondary | ICD-10-CM | POA: Diagnosis not present

## 2014-07-27 DIAGNOSIS — I5042 Chronic combined systolic (congestive) and diastolic (congestive) heart failure: Secondary | ICD-10-CM | POA: Diagnosis not present

## 2014-07-27 DIAGNOSIS — J439 Emphysema, unspecified: Secondary | ICD-10-CM | POA: Diagnosis not present

## 2014-07-27 DIAGNOSIS — K219 Gastro-esophageal reflux disease without esophagitis: Secondary | ICD-10-CM | POA: Diagnosis not present

## 2014-07-27 DIAGNOSIS — Z79899 Other long term (current) drug therapy: Secondary | ICD-10-CM | POA: Diagnosis not present

## 2014-07-27 DIAGNOSIS — E039 Hypothyroidism, unspecified: Secondary | ICD-10-CM | POA: Diagnosis not present

## 2014-07-27 DIAGNOSIS — Z418 Encounter for other procedures for purposes other than remedying health state: Secondary | ICD-10-CM | POA: Diagnosis not present

## 2014-07-27 DIAGNOSIS — R7989 Other specified abnormal findings of blood chemistry: Secondary | ICD-10-CM | POA: Diagnosis not present

## 2014-07-27 DIAGNOSIS — N184 Chronic kidney disease, stage 4 (severe): Secondary | ICD-10-CM | POA: Diagnosis not present

## 2014-08-03 DIAGNOSIS — I129 Hypertensive chronic kidney disease with stage 1 through stage 4 chronic kidney disease, or unspecified chronic kidney disease: Secondary | ICD-10-CM | POA: Diagnosis not present

## 2014-08-03 DIAGNOSIS — E039 Hypothyroidism, unspecified: Secondary | ICD-10-CM | POA: Diagnosis not present

## 2014-08-03 DIAGNOSIS — Z79899 Other long term (current) drug therapy: Secondary | ICD-10-CM | POA: Diagnosis not present

## 2014-08-03 DIAGNOSIS — Z418 Encounter for other procedures for purposes other than remedying health state: Secondary | ICD-10-CM | POA: Diagnosis not present

## 2014-08-03 DIAGNOSIS — N184 Chronic kidney disease, stage 4 (severe): Secondary | ICD-10-CM | POA: Diagnosis not present

## 2014-08-03 DIAGNOSIS — N390 Urinary tract infection, site not specified: Secondary | ICD-10-CM | POA: Diagnosis not present

## 2014-08-03 DIAGNOSIS — Z942 Lung transplant status: Secondary | ICD-10-CM | POA: Diagnosis not present

## 2014-08-03 DIAGNOSIS — J439 Emphysema, unspecified: Secondary | ICD-10-CM | POA: Diagnosis not present

## 2014-08-03 DIAGNOSIS — R7989 Other specified abnormal findings of blood chemistry: Secondary | ICD-10-CM | POA: Diagnosis not present

## 2014-08-03 DIAGNOSIS — K219 Gastro-esophageal reflux disease without esophagitis: Secondary | ICD-10-CM | POA: Diagnosis not present

## 2014-08-03 DIAGNOSIS — T86819 Unspecified complication of lung transplant: Secondary | ICD-10-CM | POA: Diagnosis not present

## 2014-08-03 DIAGNOSIS — I5042 Chronic combined systolic (congestive) and diastolic (congestive) heart failure: Secondary | ICD-10-CM | POA: Diagnosis not present

## 2014-08-11 DIAGNOSIS — Z79899 Other long term (current) drug therapy: Secondary | ICD-10-CM | POA: Diagnosis not present

## 2014-08-11 DIAGNOSIS — R7989 Other specified abnormal findings of blood chemistry: Secondary | ICD-10-CM | POA: Diagnosis not present

## 2014-08-11 DIAGNOSIS — I5042 Chronic combined systolic (congestive) and diastolic (congestive) heart failure: Secondary | ICD-10-CM | POA: Diagnosis not present

## 2014-08-11 DIAGNOSIS — E039 Hypothyroidism, unspecified: Secondary | ICD-10-CM | POA: Diagnosis not present

## 2014-08-11 DIAGNOSIS — I129 Hypertensive chronic kidney disease with stage 1 through stage 4 chronic kidney disease, or unspecified chronic kidney disease: Secondary | ICD-10-CM | POA: Diagnosis not present

## 2014-08-11 DIAGNOSIS — N390 Urinary tract infection, site not specified: Secondary | ICD-10-CM | POA: Diagnosis not present

## 2014-08-11 DIAGNOSIS — Z942 Lung transplant status: Secondary | ICD-10-CM | POA: Diagnosis not present

## 2014-08-11 DIAGNOSIS — K219 Gastro-esophageal reflux disease without esophagitis: Secondary | ICD-10-CM | POA: Diagnosis not present

## 2014-08-11 DIAGNOSIS — N184 Chronic kidney disease, stage 4 (severe): Secondary | ICD-10-CM | POA: Diagnosis not present

## 2014-08-11 DIAGNOSIS — J439 Emphysema, unspecified: Secondary | ICD-10-CM | POA: Diagnosis not present

## 2014-08-11 DIAGNOSIS — T86819 Unspecified complication of lung transplant: Secondary | ICD-10-CM | POA: Diagnosis not present

## 2014-08-11 DIAGNOSIS — Z418 Encounter for other procedures for purposes other than remedying health state: Secondary | ICD-10-CM | POA: Diagnosis not present

## 2014-08-13 ENCOUNTER — Other Ambulatory Visit: Payer: Self-pay | Admitting: Family Medicine

## 2014-08-13 NOTE — Telephone Encounter (Signed)
Med filled.  

## 2014-08-14 ENCOUNTER — Other Ambulatory Visit: Payer: Self-pay | Admitting: Family Medicine

## 2014-08-14 NOTE — Telephone Encounter (Signed)
Med filled.  

## 2014-08-16 DIAGNOSIS — I82409 Acute embolism and thrombosis of unspecified deep veins of unspecified lower extremity: Secondary | ICD-10-CM

## 2014-08-16 HISTORY — DX: Acute embolism and thrombosis of unspecified deep veins of unspecified lower extremity: I82.409

## 2014-08-17 ENCOUNTER — Encounter: Payer: Self-pay | Admitting: Family Medicine

## 2014-08-20 DIAGNOSIS — Z418 Encounter for other procedures for purposes other than remedying health state: Secondary | ICD-10-CM | POA: Diagnosis not present

## 2014-08-20 DIAGNOSIS — Z942 Lung transplant status: Secondary | ICD-10-CM | POA: Diagnosis not present

## 2014-08-20 DIAGNOSIS — N184 Chronic kidney disease, stage 4 (severe): Secondary | ICD-10-CM | POA: Diagnosis not present

## 2014-08-20 DIAGNOSIS — I5042 Chronic combined systolic (congestive) and diastolic (congestive) heart failure: Secondary | ICD-10-CM | POA: Diagnosis not present

## 2014-08-20 DIAGNOSIS — R7989 Other specified abnormal findings of blood chemistry: Secondary | ICD-10-CM | POA: Diagnosis not present

## 2014-08-20 DIAGNOSIS — Z79899 Other long term (current) drug therapy: Secondary | ICD-10-CM | POA: Diagnosis not present

## 2014-08-20 DIAGNOSIS — E039 Hypothyroidism, unspecified: Secondary | ICD-10-CM | POA: Diagnosis not present

## 2014-08-20 DIAGNOSIS — K219 Gastro-esophageal reflux disease without esophagitis: Secondary | ICD-10-CM | POA: Diagnosis not present

## 2014-08-20 DIAGNOSIS — J439 Emphysema, unspecified: Secondary | ICD-10-CM | POA: Diagnosis not present

## 2014-08-20 DIAGNOSIS — T86819 Unspecified complication of lung transplant: Secondary | ICD-10-CM | POA: Diagnosis not present

## 2014-08-20 DIAGNOSIS — N390 Urinary tract infection, site not specified: Secondary | ICD-10-CM | POA: Diagnosis not present

## 2014-08-20 DIAGNOSIS — I129 Hypertensive chronic kidney disease with stage 1 through stage 4 chronic kidney disease, or unspecified chronic kidney disease: Secondary | ICD-10-CM | POA: Diagnosis not present

## 2014-08-23 ENCOUNTER — Emergency Department (INDEPENDENT_AMBULATORY_CARE_PROVIDER_SITE_OTHER)
Admission: EM | Admit: 2014-08-23 | Discharge: 2014-08-23 | Disposition: A | Discharge status: Home / Self Care | Payer: Medicare Other | Source: Home / Self Care | Attending: Family Medicine | Admitting: Family Medicine

## 2014-08-23 ENCOUNTER — Encounter (HOSPITAL_COMMUNITY): Payer: Self-pay

## 2014-08-23 DIAGNOSIS — M7989 Other specified soft tissue disorders: Secondary | ICD-10-CM

## 2014-08-23 NOTE — ED Notes (Signed)
MD evaluation only, reportedly has 2-3 day duration of swelling in right foot, leg, neck. Denies pain

## 2014-08-23 NOTE — Discharge Instructions (Signed)
Thank you for coming in today. Call you primary care doctor tomorrow morning and schedule an ultrasound to evaluate for a blood clot.  There is a risk in not going to the emergency room tonight  Deep Vein Thrombosis A deep vein thrombosis (DVT) is a blood clot that develops in the deep, larger veins of the leg, arm, or pelvis. These are more dangerous than clots that might form in veins near the surface of the body. A DVT can lead to serious and even life-threatening complications if the clot breaks off and travels in the bloodstream to the lungs.  A DVT can damage the valves in your leg veins so that instead of flowing upward, the blood pools in the lower leg. This is called post-thrombotic syndrome, and it can result in pain, swelling, discoloration, and sores on the leg. CAUSES Usually, several things contribute to the formation of blood clots. Contributing factors include:  The flow of blood slows down.  The inside of the vein is damaged in some way.  You have a condition that makes blood clot more easily. RISK FACTORS Some people are more likely than others to develop blood clots. Risk factors include:   Smoking.  Being overweight (obese).  Sitting or lying still for a long time. This includes long-distance travel, paralysis, or recovery from an illness or surgery. Other factors that increase risk are:   Older age, especially over 64 years of age.  Having a family history of blood clots or if you have already had a blot clot.  Having major or lengthy surgery. This is especially true for surgery on the hip, knee, or belly (abdomen). Hip surgery is particularly high risk.  Having a long, thin tube (catheter) placed inside a vein during a medical procedure.  Breaking a hip or leg.  Having cancer or cancer treatment.  Pregnancy and childbirth.  Hormone changes make the blood clot more easily during pregnancy.  The fetus puts pressure on the veins of the pelvis.  There is a  risk of injury to veins during delivery or a caesarean delivery. The risk is highest just after childbirth.  Medicines containing the female hormone estrogen. This includes birth control pills and hormone replacement therapy.  Other circulation or heart problems.  SIGNS AND SYMPTOMS When a clot forms, it can either partially or totally block the blood flow in that vein. Symptoms of a DVT can include:  Swelling of the leg or arm, especially if one side is much worse.  Warmth and redness of the leg or arm, especially if one side is much worse.  Pain in an arm or leg. If the clot is in the leg, symptoms may be more noticeable or worse when standing or walking. The symptoms of a DVT that has traveled to the lungs (pulmonary embolism, PE) usually start suddenly and include:  Shortness of breath.  Coughing.  Coughing up blood or blood-tinged mucus.  Chest pain. The chest pain is often worse with deep breaths.  Rapid heartbeat. Anyone with these symptoms should get emergency medical treatment right away. Do not wait to see if the symptoms will go away. Call your local emergency services (911 in the [place] if you have these symptoms. Do not drive yourself to the hospital. DIAGNOSIS If a DVT is suspected, your health care provider will take a full medical history and perform a physical exam. Tests that also may be required include:  Blood tests, including studies of the clotting properties of the blood.  Ultrasound to see if you have clots in your legs or lungs.  X-rays to show the flow of blood when dye is injected into the veins (venogram).  Studies of your lungs if you have any chest symptoms. PREVENTION  Exercise the legs regularly. Take a brisk 30-minute walk every day.  Maintain a weight that is appropriate for your height.  Avoid sitting or lying in bed for long periods of time without moving your legs.  Women, particularly those over the age of 30 years, should consider the  risks and benefits of taking estrogen medicines, including birth control pills.  Do not smoke, especially if you take estrogen medicines.  Long-distance travel can increase your risk of DVT. You should exercise your legs by walking or pumping the muscles every hour.  Many of the risk factors above relate to situations that exist with hospitalization, either for illness, injury, or elective surgery. Prevention may include medical and nonmedical measures.  Your health care provider will assess you for the need for venous thromboembolism prevention when you are admitted to the hospital. If you are having surgery, your surgeon will assess you the day of or day after surgery. TREATMENT Once identified, a DVT can be treated. It can also be prevented in some circumstances. Once you have had a DVT, you may be at increased risk for a DVT in the future. The most common treatment for DVT is blood-thinning (anticoagulant) medicine, which reduces the blood's tendency to clot. Anticoagulants can stop new blood clots from forming and stop old clots from growing. They cannot dissolve existing clots. Your body does this by itself over time. Anticoagulants can be given by mouth, through an IV tube, or by injection. Your health care provider will determine the best program for you. Other medicines or treatments that may be used are:  Heparin or related medicines (low molecular weight heparin) are often the first treatment for a blood clot. They act quickly. However, they cannot be taken orally and must be given either in shot form or by IV tube.  Heparin can cause a fall in a component of blood that stops bleeding and forms blood clots (platelets). You will be monitored with blood tests to be sure this does not occur.  Warfarin is an anticoagulant that can be swallowed. It takes a few days to start working, so usually heparin or related medicines are used in combination. Once warfarin is working, heparin is usually  stopped.  Factor Xa inhibitor medicines, such as rivaroxaban and apixaban, also reduce blood clotting. These medicines are taken orally and can often be used without heparin or related medicines.  Less commonly, clot dissolving drugs (thrombolytics) are used to dissolve a DVT. They carry a high risk of bleeding, so they are used mainly in severe cases where your life or a part of your body is threatened.  Very rarely, a blood clot in the leg needs to be removed surgically.  If you are unable to take anticoagulants, your health care provider may arrange for you to have a filter placed in a main vein in your abdomen. This filter prevents clots from traveling to your lungs. HOME CARE INSTRUCTIONS  Take all medicines as directed by your health care provider.  Learn as much as you can about DVT.  Wear a medical alert bracelet or carry a medical alert card.  Ask your health care provider how soon you can go back to normal activities. It is important to stay active to prevent blood clots.  If you are on anticoagulant medicine, avoid contact sports.  It is very important to exercise. This is especially important while traveling, sitting, or standing for long periods of time. Exercise your legs by walking or by tightening and relaxing your leg muscles regularly. Take frequent walks.  You may need to wear compression stockings. These are tight elastic stockings that apply pressure to the lower legs. This pressure can help keep the blood in the legs from clotting. Taking Warfarin Warfarin is a daily medicine that is taken by mouth. Your health care provider will advise you on the length of treatment (usually 3-6 months, sometimes lifelong). If you take warfarin:  Understand how to take warfarin and foods that can affect how warfarin works in Veterinary surgeon.  Too much and too little warfarin are both dangerous. Too much warfarin increases the risk of bleeding. Too little warfarin continues to allow the risk  for blood clots. Warfarin and Regular Blood Testing While taking warfarin, you will need to have regular blood tests to measure your blood clotting time. These blood tests usually include both the prothrombin time (PT) and international normalized ratio (INR) tests. The PT and INR results allow your health care provider to adjust your dose of warfarin. It is very important that you have your PT and INR tested as often as directed by your health care provider.  Warfarin and Your Diet Avoid major changes in your diet, or notify your health care provider before changing your diet. Arrange a visit with a registered dietitian to answer your questions. Many foods, especially foods high in vitamin K, can interfere with warfarin and affect the PT and INR results. You should eat a consistent amount of foods high in vitamin K. Foods high in vitamin K include:   Spinach, kale, broccoli, cabbage, collard and turnip greens, Brussels sprouts, peas, cauliflower, seaweed, and parsley.  Beef and pork liver.  Green tea.  Soybean oil. Warfarin with Other Medicines Many medicines can interfere with warfarin and affect the PT and INR results. You must:  Tell your health care provider about any and all medicines, vitamins, and supplements you take, including aspirin and other over-the-counter anti-inflammatory medicines. Be especially cautious with aspirin and anti-inflammatory medicines. Ask your health care provider before taking these.  Do not take or discontinue any prescribed or over-the-counter medicine except on the advice of your health care provider or pharmacist. Warfarin Side Effects Warfarin can have side effects, such as easy bruising and difficulty stopping bleeding. Ask your health care provider or pharmacist about other side effects of warfarin. You will need to:  Hold pressure over cuts for longer than usual.  Notify your dentist and other health care providers that you are taking warfarin  before you undergo any procedures where bleeding may occur. Warfarin with Alcohol and Tobacco   Drinking alcohol frequently can increase the effect of warfarin, leading to excess bleeding. It is best to avoid alcoholic drinks or to consume only very small amounts while taking warfarin. Notify your health care provider if you change your alcohol intake.   Do not use any tobacco products including cigarettes, chewing tobacco, or electronic cigarettes. If you smoke, quit. Ask your health care provider for help with quitting smoking. Alternative Medicines to Warfarin: Factor Xa Inhibitor Medicines  These blood-thinning medicines are taken by mouth, usually for several weeks or longer. It is important to take the medicine every single day at the same time each day.  There are no regular blood tests required  when using these medicines.  There are fewer food and drug interactions than with warfarin.  The side effects of this class of medicine are similar to those of warfarin, including excessive bruising or bleeding. Ask your health care provider or pharmacist about other potential side effects. SEEK MEDICAL CARE IF:  You notice a rapid heartbeat.  You feel weaker or more tired than usual.  You feel faint.  You notice increased bruising.  You feel your symptoms are not getting better in the time expected.  You believe you are having side effects of medicine. SEEK IMMEDIATE MEDICAL CARE IF:  You have chest pain.  You have trouble breathing.  You have new or increased swelling or pain in one leg.  You cough up blood.  You notice blood in vomit, in a bowel movement, or in urine. MAKE SURE YOU:  Understand these instructions.  Will watch your condition.  Will get help right away if you are not doing well or get worse. Document Released: 04/03/2005 Document Revised: 08/18/2013 Document Reviewed: 12/09/2012 [facility] Patient Information 2015 [place], [place]. This information is not  intended to replace advice given to you by your health care provider. Make sure you discuss any questions you have with your health care provider.

## 2014-08-23 NOTE — ED Provider Notes (Signed)
Nancy Blair is a 79 y.o. female who presents to Urgent Care today for right leg swelling. Patient has over a week of right leg swelling. Her daughters noticed it today and brought her to urgent care. She has no chest pain palpitations or shortness of breath. She is in her normal state of health. She feels well otherwise. She has multiple significant medical problems including heart failure, lung transplant, and CKD.   Past Medical History  Diagnosis Date  . Emphysema of lung   . Hypertension   . Hyperlipidemia   . Allergy   . Anemia   . GERD (gastroesophageal reflux disease)   . Adrenal insufficiency   . Squamous acanthoma of skin 06/2011    rt leg  . Hypothyroidism   . CKD (chronic kidney disease) stage 4, GFR 15-29 ml/min     since 2012 (per [facility] records)  . Lung transplant status, bilateral   . Acute on chronic diastolic heart failure   . Ejection fraction   . Diabetes mellitus without complication    Past Surgical History  Procedure Laterality Date  . Appendectomy    . Abdominal hysterectomy    . Tonsillectomy    . Total lung replacement  2012    b/l lung transplant  . Esophagogastroduodenoscopy  05/26/2011    Procedure: ESOPHAGOGASTRODUODENOSCOPY (EGD);  Surgeon: Owens Loffler, MD;  Location: [facility];  Service: Endoscopy;  Laterality: N/A;  . Nissen fundoplication     History  Substance Use Topics  . Smoking status: Former Smoker -- 3.00 packs/day    Start date: 04/17/1952    Quit date: 07/16/1993  . Smokeless tobacco: Never Used     Comment: quit 25 years ago  . Alcohol Use: No   ROS as above Medications: No current facility-administered medications for this encounter.   Current Outpatient Prescriptions  Medication Sig Dispense Refill  . amLODipine (NORVASC) 2.5 MG tablet Take 1 tablet (2.5 mg total) by mouth daily. 30 tablet 6  . aspirin 81 MG chewable tablet Chew 81 mg by mouth daily.    . carvedilol (COREG) 6.25 MG tablet Take 1 tablet (6.25 mg total)  by mouth 2 (two) times daily with a meal. 60 tablet 1  . cephALEXin (KEFLEX) 250 MG capsule Take 1 capsule (250 mg total) by mouth 3 (three) times daily. 21 capsule 0  . citalopram (CELEXA) 40 MG tablet TAKE 1 TABLET BY MOUTH DAILY 30 tablet 3  . cycloSPORINE modified (NEORAL) 25 MG capsule Take 75-100 mg by mouth 2 (two) times daily. 75mg  in the morning and 100 mg in the evening    . fenofibrate 160 MG tablet TAKE ONE TABLET BY MOUTH EVERY DAY 90 tablet 1  . ferrous sulfate 325 (65 FE) MG tablet Take 325 mg by mouth daily with breakfast.    . furosemide (LASIX) 20 MG tablet Take 1 tablet (20 mg total) by mouth every other day. Takes on Monday, Wednesday, and Friday 90 tablet 0  . guaiFENesin (MUCINEX) 600 MG 12 hr tablet Take 600 mg by mouth 2 (two) times daily as needed for cough or to loosen phlegm.    . hydrocortisone (CORTEF) 20 MG tablet Take by mouth 2 (two) times daily.     . insulin regular (NOVOLIN R,HUMULIN R) 100 units/mL injection Inject 1-3 Units into the skin at bedtime as needed for high blood sugar. On sliding scale    . isosorbide mononitrate (IMDUR) 30 MG 24 hr tablet Take 0.5 tablets (15 mg total) by  mouth daily. 90 tablet 0  . levothyroxine (SYNTHROID, LEVOTHROID) 100 MCG tablet Take 1 tablet (100 mcg total) by mouth daily. 30 tablet 3  . Multiple Vitamin (MULITIVITAMIN WITH MINERALS) TABS Take 1 tablet by mouth daily.    . mycophenolate (CELLCEPT) 500 MG tablet Take 500 mg by mouth 2 (two) times daily.     . Potassium Chloride ER 20 MEQ TBCR TAKE 1 TABLET BY MOUTH ONCE DAILY (Patient not taking: Reported on 04/27/2014) 30 tablet 4  . potassium chloride SA (K-DUR,KLOR-CON) 20 MEQ tablet Take 1 tablet (20 mEq total) by mouth daily. 30 tablet 6  . pravastatin (PRAVACHOL) 20 MG tablet TAKE 1 TABLET BY MOUTH EVERY NIGHT AT BEDTIME 30 tablet 4  . sulfamethoxazole-trimethoprim (BACTRIM,SEPTRA) 400-80 MG per tablet Take 1 tablet by mouth every Monday, Wednesday, and Friday.     .  valGANciclovir (VALCYTE) 450 MG tablet Take 450 mg by mouth every Monday, Wednesday, and Friday.     . zaleplon (SONATA) 5 MG capsule Take 5 mg by mouth at bedtime.     Allergies  Allergen Reactions  . Grapefruit Extract Anaphylaxis  . Oysters [Shellfish Allergy] Anaphylaxis  . Pineapple Anaphylaxis  . Bupropion Other (See Comments)    sleepy  . Neomycin-Bacitracin Zn-Polymyx Hives and Other (See Comments)    blisters  . Penicillins Swelling    Just face swelling  . Prednisone Other (See Comments)    crazy     Exam:  BP 118/68 mmHg  Pulse 91  Temp(Src) 98.2 F (36.8 C) (Oral)  Resp 18  SpO2 98% Gen: Well NAD HEENT: EOMI,  MMM no neck swelling Lungs: Normal work of breathing. CTABL Heart: RRR no MRG Abd: NABS, Soft. Nondistended, Nontender Exts: Brisk capillary refill, warm and well perfused.  Right leg mildly swollen with 1+ pitting edema to the calf. Capillary refill and sensation are intact distally.  No results found for this or any previous visit (from the past 24 hour(s)). No results found.  Assessment and Plan: 79 y.o. female with right leg swelling. This is obviously concerning for DVT. I had an extensive discussion with the patient and her two daughters about the risks versus benefits of evaluation tonight in the emergency room. Patient wishes to not go to the emergency room and states that she can follow-up with her primary care provider tomorrow. I explained that there is some risk in that however I feel is reasonable to see her doctor in the morning to avoid an emergency room visit tonight.  Discussed warning signs or symptoms. Please see discharge instructions. Patient expresses understanding.     Gregor Hams, MD 08/23/14 [id number]

## 2014-08-24 ENCOUNTER — Ambulatory Visit (INDEPENDENT_AMBULATORY_CARE_PROVIDER_SITE_OTHER): Payer: Medicare Other | Admitting: Family Medicine

## 2014-08-24 ENCOUNTER — Inpatient Hospital Stay (HOSPITAL_BASED_OUTPATIENT_CLINIC_OR_DEPARTMENT_OTHER)
Admission: AD | Admit: 2014-08-24 | Discharge: 2014-08-28 | DRG: 300 | Disposition: A | Discharge status: Home Health | Payer: Medicare Other | Source: Ambulatory Visit | Attending: Internal Medicine | Admitting: Internal Medicine

## 2014-08-24 ENCOUNTER — Encounter (HOSPITAL_COMMUNITY): Payer: Self-pay | Admitting: Internal Medicine

## 2014-08-24 ENCOUNTER — Ambulatory Visit (HOSPITAL_BASED_OUTPATIENT_CLINIC_OR_DEPARTMENT_OTHER)
Admission: RE | Admit: 2014-08-24 | Discharge: 2014-08-24 | Disposition: A | Discharge status: Home / Self Care | Payer: Medicare Other | Source: Ambulatory Visit | Attending: Family Medicine | Admitting: Family Medicine

## 2014-08-24 ENCOUNTER — Telehealth: Payer: Self-pay | Admitting: Family Medicine

## 2014-08-24 ENCOUNTER — Encounter: Payer: Self-pay | Admitting: Family Medicine

## 2014-08-24 ENCOUNTER — Other Ambulatory Visit: Payer: Self-pay | Admitting: Family Medicine

## 2014-08-24 VITALS — BP 122/80 | HR 78 | Temp 97.9°F | Resp 16

## 2014-08-24 DIAGNOSIS — I82401 Acute embolism and thrombosis of unspecified deep veins of right lower extremity: Secondary | ICD-10-CM

## 2014-08-24 DIAGNOSIS — E785 Hyperlipidemia, unspecified: Secondary | ICD-10-CM | POA: Diagnosis present

## 2014-08-24 DIAGNOSIS — S81811A Laceration without foreign body, right lower leg, initial encounter: Secondary | ICD-10-CM | POA: Insufficient documentation

## 2014-08-24 DIAGNOSIS — M7989 Other specified soft tissue disorders: Secondary | ICD-10-CM | POA: Diagnosis present

## 2014-08-24 DIAGNOSIS — I82411 Acute embolism and thrombosis of right femoral vein: Secondary | ICD-10-CM | POA: Diagnosis not present

## 2014-08-24 DIAGNOSIS — I129 Hypertensive chronic kidney disease with stage 1 through stage 4 chronic kidney disease, or unspecified chronic kidney disease: Secondary | ICD-10-CM | POA: Diagnosis not present

## 2014-08-24 DIAGNOSIS — E038 Other specified hypothyroidism: Secondary | ICD-10-CM | POA: Diagnosis not present

## 2014-08-24 DIAGNOSIS — Z85820 Personal history of malignant melanoma of skin: Secondary | ICD-10-CM

## 2014-08-24 DIAGNOSIS — I10 Essential (primary) hypertension: Secondary | ICD-10-CM | POA: Diagnosis not present

## 2014-08-24 DIAGNOSIS — E039 Hypothyroidism, unspecified: Secondary | ICD-10-CM | POA: Diagnosis present

## 2014-08-24 DIAGNOSIS — Z7982 Long term (current) use of aspirin: Secondary | ICD-10-CM

## 2014-08-24 DIAGNOSIS — N184 Chronic kidney disease, stage 4 (severe): Secondary | ICD-10-CM | POA: Diagnosis present

## 2014-08-24 DIAGNOSIS — I5032 Chronic diastolic (congestive) heart failure: Secondary | ICD-10-CM | POA: Diagnosis not present

## 2014-08-24 DIAGNOSIS — I350 Nonrheumatic aortic (valve) stenosis: Secondary | ICD-10-CM | POA: Diagnosis not present

## 2014-08-24 DIAGNOSIS — Z79899 Other long term (current) drug therapy: Secondary | ICD-10-CM

## 2014-08-24 DIAGNOSIS — Z794 Long term (current) use of insulin: Secondary | ICD-10-CM

## 2014-08-24 DIAGNOSIS — Z87891 Personal history of nicotine dependence: Secondary | ICD-10-CM

## 2014-08-24 DIAGNOSIS — S81801A Unspecified open wound, right lower leg, initial encounter: Secondary | ICD-10-CM | POA: Diagnosis not present

## 2014-08-24 DIAGNOSIS — I824Z1 Acute embolism and thrombosis of unspecified deep veins of right distal lower extremity: Secondary | ICD-10-CM | POA: Diagnosis not present

## 2014-08-24 DIAGNOSIS — IMO0001 Reserved for inherently not codable concepts without codable children: Secondary | ICD-10-CM

## 2014-08-24 DIAGNOSIS — E119 Type 2 diabetes mellitus without complications: Secondary | ICD-10-CM | POA: Diagnosis not present

## 2014-08-24 DIAGNOSIS — K219 Gastro-esophageal reflux disease without esophagitis: Secondary | ICD-10-CM | POA: Diagnosis not present

## 2014-08-24 DIAGNOSIS — Z86718 Personal history of other venous thrombosis and embolism: Secondary | ICD-10-CM | POA: Diagnosis present

## 2014-08-24 DIAGNOSIS — F0390 Unspecified dementia without behavioral disturbance: Secondary | ICD-10-CM | POA: Insufficient documentation

## 2014-08-24 DIAGNOSIS — J439 Emphysema, unspecified: Secondary | ICD-10-CM | POA: Diagnosis not present

## 2014-08-24 DIAGNOSIS — W19XXXA Unspecified fall, initial encounter: Secondary | ICD-10-CM | POA: Diagnosis not present

## 2014-08-24 DIAGNOSIS — R600 Localized edema: Secondary | ICD-10-CM

## 2014-08-24 DIAGNOSIS — F418 Other specified anxiety disorders: Secondary | ICD-10-CM | POA: Diagnosis present

## 2014-08-24 DIAGNOSIS — Z942 Lung transplant status: Secondary | ICD-10-CM

## 2014-08-24 DIAGNOSIS — Z7952 Long term (current) use of systemic steroids: Secondary | ICD-10-CM

## 2014-08-24 DIAGNOSIS — F341 Dysthymic disorder: Secondary | ICD-10-CM | POA: Diagnosis present

## 2014-08-24 DIAGNOSIS — I5042 Chronic combined systolic (congestive) and diastolic (congestive) heart failure: Secondary | ICD-10-CM | POA: Diagnosis not present

## 2014-08-24 DIAGNOSIS — I868 Varicose veins of other specified sites: Secondary | ICD-10-CM | POA: Diagnosis not present

## 2014-08-24 DIAGNOSIS — R296 Repeated falls: Secondary | ICD-10-CM | POA: Insufficient documentation

## 2014-08-24 DIAGNOSIS — I8290 Acute embolism and thrombosis of unspecified vein: Secondary | ICD-10-CM

## 2014-08-24 HISTORY — DX: Acute embolism and thrombosis of unspecified deep veins of unspecified lower extremity: I82.409

## 2014-08-24 LAB — CBC
HCT: 28.1 % — ABNORMAL LOW (ref 36.0–46.0)
HEMOGLOBIN: 8.8 g/dL — AB (ref 12.0–15.0)
MCH: 31.9 pg (ref 26.0–34.0)
MCHC: 31.3 g/dL (ref 30.0–36.0)
MCV: 101.8 fL — ABNORMAL HIGH (ref 78.0–100.0)
PLATELETS: 191 10*3/uL (ref 150–400)
RBC: 2.76 MIL/uL — ABNORMAL LOW (ref 3.87–5.11)
RDW: 15.9 % — ABNORMAL HIGH (ref 11.5–15.5)
WBC: 8.8 10*3/uL (ref 4.0–10.5)

## 2014-08-24 LAB — COMPREHENSIVE METABOLIC PANEL
ALT: 19 U/L (ref 14–54)
AST: 22 U/L (ref 15–41)
Albumin: 2.4 g/dL — ABNORMAL LOW (ref 3.5–5.0)
Alkaline Phosphatase: 35 U/L — ABNORMAL LOW (ref 38–126)
Anion gap: 5 (ref 5–15)
BUN: 38 mg/dL — ABNORMAL HIGH (ref 6–20)
CO2: 21 mmol/L — ABNORMAL LOW (ref 22–32)
Calcium: 8 mg/dL — ABNORMAL LOW (ref 8.9–10.3)
Chloride: 111 mmol/L (ref 101–111)
Creatinine, Ser: 2.95 mg/dL — ABNORMAL HIGH (ref 0.44–1.00)
GFR calc non Af Amer: 14 mL/min — ABNORMAL LOW (ref >60)
GFR, EST AFRICAN AMERICAN: 17 mL/min — AB (ref >60)
GLUCOSE: 125 mg/dL — AB (ref 70–99)
Potassium: 4.2 mmol/L (ref 3.5–5.1)
Sodium: 137 mmol/L (ref 135–145)
Total Bilirubin: 0.9 mg/dL (ref 0.3–1.2)
Total Protein: 5.3 g/dL — ABNORMAL LOW (ref 6.5–8.1)

## 2014-08-24 LAB — PROTIME-INR
INR: 1.17 (ref 0.00–1.49)
Prothrombin Time: 15 seconds (ref 11.6–15.2)

## 2014-08-24 LAB — APTT: aPTT: 40 seconds — ABNORMAL HIGH (ref 24–37)

## 2014-08-24 MED ORDER — GUAIFENESIN ER 600 MG PO TB12: 600.0000 mg | ORAL_TABLET | Freq: Two times a day (BID) | ORAL | Status: DC | PRN

## 2014-08-24 MED ORDER — ADULT MULTIVITAMIN W/MINERALS CH
1.0000 | ORAL_TABLET | Freq: Every day | ORAL | Status: DC
  Administered 2014-08-25 – 2014-08-28 (×4): 1 via ORAL
  Filled 2014-08-24 (×4): qty 1

## 2014-08-24 MED ORDER — MYCOPHENOLATE MOFETIL 500 MG PO TABS
500.0000 mg | ORAL_TABLET | Freq: Two times a day (BID) | ORAL | Status: DC
  Administered 2014-08-24: 500 mg via ORAL
  Filled 2014-08-24 (×3): qty 1

## 2014-08-24 MED ORDER — SODIUM CHLORIDE 0.9 % IJ SOLN: 3.0000 mL | INTRAMUSCULAR | Status: DC | PRN

## 2014-08-24 MED ORDER — FUROSEMIDE 20 MG PO TABS
20.0000 mg | ORAL_TABLET | ORAL | Status: DC
  Administered 2014-08-26: 20 mg via ORAL
  Filled 2014-08-24: qty 1

## 2014-08-24 MED ORDER — ISOSORBIDE MONONITRATE ER 30 MG PO TB24
15.0000 mg | ORAL_TABLET | Freq: Every day | ORAL | Status: DC
  Administered 2014-08-25 – 2014-08-28 (×4): 15 mg via ORAL
  Filled 2014-08-24 (×4): qty 1

## 2014-08-24 MED ORDER — CYCLOSPORINE MODIFIED (NEORAL) 25 MG PO CAPS
125.0000 mg | ORAL_CAPSULE | Freq: Two times a day (BID) | ORAL | Status: DC
  Administered 2014-08-24 – 2014-08-28 (×8): 125 mg via ORAL
  Filled 2014-08-24 (×10): qty 5

## 2014-08-24 MED ORDER — ACETAMINOPHEN 650 MG RE SUPP: 650.0000 mg | Freq: Four times a day (QID) | RECTAL | Status: DC | PRN

## 2014-08-24 MED ORDER — CARVEDILOL 6.25 MG PO TABS
6.2500 mg | ORAL_TABLET | Freq: Two times a day (BID) | ORAL | Status: DC
  Administered 2014-08-24 – 2014-08-28 (×8): 6.25 mg via ORAL
  Filled 2014-08-24 (×8): qty 1

## 2014-08-24 MED ORDER — SODIUM CHLORIDE 0.9 % IV SOLN: 250.0000 mL | INTRAVENOUS | Status: DC | PRN

## 2014-08-24 MED ORDER — ACETAMINOPHEN 325 MG PO TABS: 650.0000 mg | ORAL_TABLET | Freq: Four times a day (QID) | ORAL | Status: DC | PRN

## 2014-08-24 MED ORDER — ONDANSETRON HCL 4 MG PO TABS: 4.0000 mg | ORAL_TABLET | Freq: Four times a day (QID) | ORAL | Status: DC | PRN

## 2014-08-24 MED ORDER — VALGANCICLOVIR HCL 450 MG PO TABS
450.0000 mg | ORAL_TABLET | ORAL | Status: DC
  Administered 2014-08-26 – 2014-08-28 (×2): 450 mg via ORAL
  Filled 2014-08-24 (×2): qty 1

## 2014-08-24 MED ORDER — FENOFIBRATE 160 MG PO TABS
160.0000 mg | ORAL_TABLET | Freq: Every day | ORAL | Status: DC
  Administered 2014-08-24 – 2014-08-28 (×5): 160 mg via ORAL
  Filled 2014-08-24 (×5): qty 1

## 2014-08-24 MED ORDER — HEPARIN BOLUS VIA INFUSION
4000.0000 [IU] | Freq: Once | INTRAVENOUS | Status: AC
  Administered 2014-08-24: 4000 [IU] via INTRAVENOUS
  Filled 2014-08-24: qty 4000

## 2014-08-24 MED ORDER — HYDROCORTISONE 20 MG PO TABS
20.0000 mg | ORAL_TABLET | Freq: Two times a day (BID) | ORAL | Status: DC
  Administered 2014-08-24 – 2014-08-28 (×8): 20 mg via ORAL
  Filled 2014-08-24 (×10): qty 1

## 2014-08-24 MED ORDER — AMLODIPINE BESYLATE 2.5 MG PO TABS
2.5000 mg | ORAL_TABLET | Freq: Every day | ORAL | Status: DC
  Administered 2014-08-25 – 2014-08-28 (×4): 2.5 mg via ORAL
  Filled 2014-08-24 (×4): qty 1

## 2014-08-24 MED ORDER — ALBUTEROL SULFATE (2.5 MG/3ML) 0.083% IN NEBU: 2.5000 mg | INHALATION_SOLUTION | RESPIRATORY_TRACT | Status: DC | PRN

## 2014-08-24 MED ORDER — CYCLOSPORINE MODIFIED (NEORAL) 25 MG PO CAPS
75.0000 mg | ORAL_CAPSULE | Freq: Two times a day (BID) | ORAL | Status: DC
Start: 2014-08-24 — End: 2014-08-24

## 2014-08-24 MED ORDER — HYDRALAZINE HCL 20 MG/ML IJ SOLN
5.0000 mg | Freq: Four times a day (QID) | INTRAMUSCULAR | Status: DC | PRN
  Administered 2014-08-24 – 2014-08-28 (×2): 5 mg via INTRAVENOUS
  Filled 2014-08-24 (×2): qty 1

## 2014-08-24 MED ORDER — LEVOTHYROXINE SODIUM 100 MCG PO TABS
100.0000 ug | ORAL_TABLET | Freq: Every day | ORAL | Status: DC
  Administered 2014-08-25 – 2014-08-28 (×4): 100 ug via ORAL
  Filled 2014-08-24 (×4): qty 1

## 2014-08-24 MED ORDER — OXYCODONE HCL 5 MG PO TABS: 5.0000 mg | ORAL_TABLET | ORAL | Status: DC | PRN

## 2014-08-24 MED ORDER — FERROUS SULFATE 325 (65 FE) MG PO TABS
325.0000 mg | ORAL_TABLET | Freq: Every day | ORAL | Status: DC
  Administered 2014-08-25 – 2014-08-28 (×4): 325 mg via ORAL
  Filled 2014-08-24 (×4): qty 1

## 2014-08-24 MED ORDER — CYCLOSPORINE MODIFIED (NEORAL) 25 MG PO CAPS
125.0000 mg | ORAL_CAPSULE | Freq: Two times a day (BID) | ORAL | Status: DC
  Filled 2014-08-24: qty 5

## 2014-08-24 MED ORDER — ASPIRIN 81 MG PO CHEW
81.0000 mg | CHEWABLE_TABLET | Freq: Every day | ORAL | Status: DC
  Administered 2014-08-25 – 2014-08-28 (×4): 81 mg via ORAL
  Filled 2014-08-24 (×4): qty 1

## 2014-08-24 MED ORDER — ONDANSETRON HCL 4 MG/2ML IJ SOLN: 4.0000 mg | Freq: Four times a day (QID) | INTRAMUSCULAR | Status: DC | PRN

## 2014-08-24 MED ORDER — HEPARIN (PORCINE) IN NACL 100-0.45 UNIT/ML-% IJ SOLN
1000.0000 [IU]/h | INTRAMUSCULAR | Status: DC
  Administered 2014-08-24: 1000 [IU]/h via INTRAVENOUS
  Filled 2014-08-24: qty 250

## 2014-08-24 MED ORDER — SULFAMETHOXAZOLE-TRIMETHOPRIM 400-80 MG PO TABS
1.0000 | ORAL_TABLET | ORAL | Status: DC
  Administered 2014-08-26: 1 via ORAL
  Filled 2014-08-24: qty 1

## 2014-08-24 MED ORDER — CITALOPRAM HYDROBROMIDE 20 MG PO TABS
40.0000 mg | ORAL_TABLET | Freq: Every day | ORAL | Status: DC
Start: 2014-08-24 — End: 2014-08-28
  Administered 2014-08-24 – 2014-08-28 (×5): 40 mg via ORAL
  Filled 2014-08-24 (×5): qty 2

## 2014-08-24 MED ORDER — SODIUM CHLORIDE 0.9 % IJ SOLN: 3.0000 mL | Freq: Two times a day (BID) | INTRAMUSCULAR | Status: DC

## 2014-08-24 MED ORDER — PRAVASTATIN SODIUM 10 MG PO TABS
20.0000 mg | ORAL_TABLET | Freq: Every day | ORAL | Status: DC
  Administered 2014-08-24 – 2014-08-27 (×4): 20 mg via ORAL
  Filled 2014-08-24 (×4): qty 2

## 2014-08-24 NOTE — Telephone Encounter (Signed)
Caller name:Chris   Relationship to patient: Daughter Can be reached:in demo notes   Reason for call: Spoke with daughter, Gerald Stabs- was in ER with  Mom over the weekend and was asked to bring her in to see Dr. [name] this morning. I have scheduled her for 11 am slot - possible blood clot on right leg.

## 2014-08-24 NOTE — Progress Notes (Signed)
   Subjective:    Patient ID: Nancy Blair, female    DOB: 12-30-1935, 79 y.o.   MRN: [id number]  HPI R leg swelling- family reports leg swelling since 4/30 and yesterday went to [facility] for evaluation.  There was concern for possible DVT.  Pt opted not to go to ER.  Pt had a 5 hr car ride last weekend and per family report she only ambulates to lunch and dinner.  No pain yesterday.  Today she stepped out of the car and fell prior to her appt.  Pt fell 3 weeks ago.  Family just got word from [place] that pt has moderate dementia and depression.  Pt now has nursing 3x/day for med administration.  Pt has [facility] [place]).  Pt has sent [facility] away on 2 occasions.   Review of Systems For ROS see HPI     Objective:   Physical Exam  Constitutional: She is oriented to person, place, and time. She appears well-developed and well-nourished. No distress.  HENT:  Head: Normocephalic and atraumatic.  Neck: Neck supple.  Cardiovascular: Normal rate and regular rhythm.   Murmur (III-IV/VI SEM) heard. Pulmonary/Chest: Effort normal and breath sounds normal. No respiratory distress. She has no wheezes. She has no rales.  Musculoskeletal: She exhibits edema (1-2+ edema of R LE to level of knee) and tenderness (R LE).  Lymphadenopathy:    She has no cervical adenopathy.  Neurological: She is alert and oriented to person, place, and time.  Skin: Skin is warm and dry.  Skin tear of R lower leg just below knee anteriorly Dark discoloration of skin of R leg  Psychiatric:  Pleasant but unable to answer questions correctly per family report  Vitals reviewed.         Assessment & Plan:

## 2014-08-24 NOTE — Assessment & Plan Note (Signed)
New.  Pt sustained skin tear during fall in parking lot at time of OV.  Wound was cleaned, edges were approximated, abx ointment applied and bandage applied.

## 2014-08-24 NOTE — Progress Notes (Signed)
ANTICOAGULATION CONSULT NOTE - Initial Consult  Pharmacy Consult for Heparin Indication: DVT, right leg, extensive  Allergies  Allergen Reactions  . Grapefruit Extract Anaphylaxis  . Oysters [Shellfish Allergy] Anaphylaxis  . Pineapple Anaphylaxis  . Bupropion Other (See Comments)    sleepy  . Neomycin-Bacitracin Zn-Polymyx Hives and Other (See Comments)    blisters  . Penicillins Swelling    Just face swelling  . Prednisone Other (See Comments)    crazy    Patient Measurements: Height: 5' (152.4 cm) Weight: 140 lb 12.8 oz (63.866 kg) IBW/kg (Calculated) : 45.5 Heparin Dosing Weight: 63.9 kg  Vital Signs: Temp: 98.3 F (36.8 C) (05/09 1647) Temp Source: Oral (05/09 1647) BP: 177/84 mmHg (05/09 1647) Pulse Rate: 74 (05/09 1647)  Labs:  to be drawn   Last in Epic from January 2016.       creatinine 2.52, Hgb 9.7, platelet count 308  Medical History: Past Medical History  Diagnosis Date  . Emphysema of lung   . Hypertension   . Hyperlipidemia   . Allergy   . Anemia   . GERD (gastroesophageal reflux disease)   . Adrenal insufficiency   . Squamous acanthoma of skin 06/2011    rt leg  . Hypothyroidism   . CKD (chronic kidney disease) stage 4, GFR 15-29 ml/min     since 2012 (per [facility] records)  . Lung transplant status, bilateral   . Acute on chronic diastolic heart failure   . Ejection fraction   . Diabetes mellitus without complication    Assessment:   To begin IV heparin for extensive right leg DVT.  To be evaluated by IR for possible lysis. Spoke briefly with Dr. Barbie Banner. OK to begin IV heparin.   Admit labs to be drawn.  IV just placed by IV nurse.  Goal of Therapy:  Heparin level 0.3-0.7 units/ml Monitor platelets by anticoagulation protocol: Yes   Plan:   After baseline labs are drawn,   Heparin 4000 units IV bolus then 1000 units/hr infusion. (~ 16 units/kg/hr)  Heparin level ~ 8 hours after drip begins.  Daily heparin level and CBC while on  heparin.  Will follow up plans +/- lysis.  Arty Baumgartner, [place] Pager: [phone] 08/24/2014,5:53 PM

## 2014-08-24 NOTE — Progress Notes (Signed)
Pre visit review using our clinic review tool, if applicable. No additional management support is needed unless otherwise documented below in the visit note. 

## 2014-08-24 NOTE — Assessment & Plan Note (Signed)
New.  Suspect DVT.  Get stat doppler.

## 2014-08-24 NOTE — H&P (Addendum)
[facility] History and Physical  Nancy Blair HER:[id number] DOB: 03/01/1936 DOA: 08/24/2014   PCP: Annye Asa, MD  Specialists: Patient is followed at [facility] at the transplant clinic. She's also followed by a cardiologist and nephrologist here locally.  Chief Complaint: Swelling of the right leg  HPI: Nancy Blair is a 79 y.o. female with a past medical history of hypertension, chronic kidney disease stage IV, lung transplant for COPD about 4 years ago done at [facility], who took her road trip to [place] about 8 days ago without taking breaks. After she came back to [facility] within 2-3 days she started developing swelling in her right leg which was more than normal. She actually went to the urgent care center and was recommended to go to the emergency department for a vascular study. However, patient preferred to go back home and see her primary care physician today. She went to her PCPs office today and was referred for a venous Doppler which was positive for DVT. Patient denies any chest pain or shortness of breath. No nausea or vomiting. No fever, chills. Her daughter is at the bedside and she reports that the patient fell twice. According to the daughter, she has unsteady gait and doesn't get around much.  Home Medications: Prior to Admission medications   Medication Sig Start Date End Date Taking? Authorizing Provider  amLODipine (NORVASC) 2.5 MG tablet Take 1 tablet (2.5 mg total) by mouth daily. 05/25/14   Midge Minium, MD  aspirin 81 MG chewable tablet Chew 81 mg by mouth daily.    Historical Provider, MD  carvedilol (COREG) 6.25 MG tablet Take 1 tablet (6.25 mg total) by mouth 2 (two) times daily with a meal. 02/07/12   Bonnielee Haff, MD  cephALEXin (KEFLEX) 250 MG capsule Take 1 capsule (250 mg total) by mouth 3 (three) times daily. 05/19/14   Midge Minium, MD  citalopram (CELEXA) 40 MG tablet TAKE 1 TABLET BY MOUTH DAILY 06/15/14    Midge Minium, MD  cycloSPORINE modified (NEORAL) 25 MG capsule Take 75-100 mg by mouth 2 (two) times daily. 75mg  in the morning and 100 mg in the evening    Historical Provider, MD  fenofibrate 160 MG tablet TAKE ONE TABLET BY MOUTH EVERY DAY 08/14/14   Midge Minium, MD  ferrous sulfate 325 (65 FE) MG tablet Take 325 mg by mouth daily with breakfast.    Historical Provider, MD  furosemide (LASIX) 20 MG tablet Take 1 tablet (20 mg total) by mouth every other day. Takes on Monday, Wednesday, and Friday 10/23/13   Midge Minium, MD  guaiFENesin (MUCINEX) 600 MG 12 hr tablet Take 600 mg by mouth 2 (two) times daily as needed for cough or to loosen phlegm.    Historical Provider, MD  hydrocortisone (CORTEF) 20 MG tablet Take by mouth 2 (two) times daily.  10/27/11   Historical Provider, MD  insulin regular (NOVOLIN R,HUMULIN R) 100 units/mL injection Inject 1-3 Units into the skin at bedtime as needed for high blood sugar. On sliding scale    Historical Provider, MD  isosorbide mononitrate (IMDUR) 30 MG 24 hr tablet Take 0.5 tablets (15 mg total) by mouth daily. 10/23/13   Midge Minium, MD  levothyroxine (SYNTHROID, LEVOTHROID) 100 MCG tablet Take 1 tablet (100 mcg total) by mouth daily. 05/20/14   Midge Minium, MD  Multiple Vitamin (MULITIVITAMIN WITH MINERALS) TABS Take 1 tablet by mouth daily.    Historical  Provider, MD  mycophenolate (CELLCEPT) 500 MG tablet Take 500 mg by mouth 2 (two) times daily.  12/21/11   Historical Provider, MD  Potassium Chloride ER 20 MEQ TBCR TAKE 1 TABLET BY MOUTH ONCE DAILY 04/04/14   Midge Minium, MD  potassium chloride SA (K-DUR,KLOR-CON) 20 MEQ tablet Take 1 tablet (20 mEq total) by mouth daily. 09/05/13   Midge Minium, MD  pravastatin (PRAVACHOL) 20 MG tablet TAKE 1 TABLET BY MOUTH EVERY NIGHT AT BEDTIME 04/08/14   Midge Minium, MD  sulfamethoxazole-trimethoprim (BACTRIM,SEPTRA) 400-80 MG per tablet Take 1 tablet by mouth every  Monday, Wednesday, and Friday.     Historical Provider, MD  valGANciclovir (VALCYTE) 450 MG tablet Take 450 mg by mouth every Monday, Wednesday, and Friday.     Historical Provider, MD  zaleplon (SONATA) 5 MG capsule Take 5 mg by mouth at bedtime.    Historical Provider, MD    Allergies:  Allergies  Allergen Reactions  . Grapefruit Extract Anaphylaxis  . Oysters [Shellfish Allergy] Anaphylaxis  . Pineapple Anaphylaxis  . Bupropion Other (See Comments)    sleepy  . Neomycin-Bacitracin Zn-Polymyx Hives and Other (See Comments)    blisters  . Penicillins Swelling    Just face swelling  . Prednisone Other (See Comments)    crazy    Past Medical History: Past Medical History  Diagnosis Date  . Emphysema of lung   . Hypertension   . Hyperlipidemia   . Allergy   . Anemia   . GERD (gastroesophageal reflux disease)   . Adrenal insufficiency   . Squamous acanthoma of skin 06/2011    rt leg  . Hypothyroidism   . CKD (chronic kidney disease) stage 4, GFR 15-29 ml/min     since 2012 (per [facility] records)  . Lung transplant status, bilateral   . Acute on chronic diastolic heart failure   . Ejection fraction   . Diabetes mellitus without complication     Past Surgical History  Procedure Laterality Date  . Appendectomy    . Abdominal hysterectomy    . Tonsillectomy    . Total lung replacement  2012    b/l lung transplant  . Esophagogastroduodenoscopy  05/26/2011    Procedure: ESOPHAGOGASTRODUODENOSCOPY (EGD);  Surgeon: Owens Loffler, MD;  Location: [facility];  Service: Endoscopy;  Laterality: N/A;  . Nissen fundoplication      Social History: [facility] lives in [facility] which is an independent living facility. She has home health. No history of smoking, alcohol use or illicit drug use. She is not very ambulatory. According to daughter.  Family History:  Family History  Problem Relation Age of Onset  . Heart disease Mother   . Heart disease Father   . Pancreatic  cancer Sister   . Colon cancer Neg Hx   . Malignant hyperthermia Neg Hx   . Hypertension Mother   . Hypertension Father      Review of Systems - History obtained from the patient General ROS: negative Psychological ROS: negative Ophthalmic ROS: negative ENT ROS: negative Allergy and Immunology ROS: negative Hematological and Lymphatic ROS: negative Endocrine ROS: negative Respiratory ROS: no cough, shortness of breath, or wheezing Cardiovascular ROS: no chest pain or dyspnea on exertion Gastrointestinal ROS: no abdominal pain, change in bowel habits, or black or bloody stools Genito-Urinary ROS: no dysuria, trouble voiding, or hematuria Musculoskeletal ROS: As in history of present illness Neurological ROS: no TIA or stroke symptoms Dermatological ROS: negative  Physical Examination  Filed Vitals:   08/24/14 1647  BP: 177/84  Pulse: 74  Temp: 98.3 F (36.8 C)  TempSrc: Oral  Resp: 16  Height: 5' (1.524 m)  Weight: 63.866 kg (140 lb 12.8 oz)  SpO2: 100%    BP 177/84 mmHg  Pulse 74  Temp(Src) 98.3 F (36.8 C) (Oral)  Resp 16  Ht 5' (1.524 m)  Wt 63.866 kg (140 lb 12.8 oz)  BMI 27.50 kg/m2  SpO2 100%  General appearance: alert, cooperative, appears stated age and no distress Head: Normocephalic, without obvious abnormality, atraumatic Eyes: conjunctivae/corneas clear. PERRL, EOM's intact. Throat: lips, mucosa, and tongue normal; teeth and gums normal Neck: no adenopathy, no carotid bruit, no JVD, supple, symmetrical, trachea midline and thyroid not enlarged, symmetric, no tenderness/mass/nodules Resp: clear to auscultation bilaterally Cardio: regular rate and rhythm, S1, S2 normal, no murmur, click, rub or gallop GI: soft, non-tender; bowel sounds normal; no masses,  no organomegaly Extremities: Right lower extremity is swollen. It was covered in dressing which was removed which revealed a skin tear. There is also some oozing from the lower part of the leg  posteriorly. Pulses: Palpable, but poor Skin: Skin color, texture, turgor normal. No rashes or lesions Neurologic: No focal deficits  Laboratory Data: No results found for this or any previous visit (from the past 48 hour(s)). Blood work is pending  Radiology Reports: US Venous Img Lower Unilateral Right  08/24/2014   CLINICAL DATA:  Varicose veins strip from both legs. History of bilateral lung transplant. Right lower leg swelling for 1 week.  EXAM: Right LOWER EXTREMITY VENOUS DOPPLER ULTRASOUND  TECHNIQUE: Gray-scale sonography with graded compression, as well as color Doppler and duplex ultrasound were performed to evaluate the lower extremity deep venous systems from the level of the common femoral vein and including the common femoral, femoral, profunda femoral, popliteal and calf veins including the posterior tibial, peroneal and gastrocnemius veins when visible. The superficial great saphenous vein was also interrogated. Spectral Doppler was utilized to evaluate flow at rest and with distal augmentation maneuvers in the common femoral, femoral and popliteal veins.  COMPARISON:  None.  FINDINGS: There is hypoechoic, expansile thrombus throughout the right deep venous system, extending from the calf veins veins through the femoral vein into the common femoral vein. The majority of the thrombosis is complete/occlusive. Mobile luminal clot present at the level of the common femoral vein. The contralateral common femoral vein has normal respiratory phasicity and no evidence of thrombus.  The patient returned to ordering physician's office to await results in a wheelchair. This study is marked for technologist call report.  IMPRESSION: Diffuse acute right lower extremity deep venous thrombosis with clot from the calf into the common femoral vein. The majority of the thrombosis is occlusive, consider IR clinic referral for thrombolysis evaluation.   Electronically Signed   By: Monte Fantasia M.D.   On:  08/24/2014 13:29     Problem List  Principal Problem:   Right leg DVT Active Problems:   Hypothyroidism   ANXIETY DEPRESSION   Essential hypertension   Lung transplant status, bilateral   Aortic stenosis   Chronic diastolic heart failure   CKD (chronic kidney disease) stage 4, GFR 15-29 ml/min   Long term current use of systemic steroids   DVT (deep venous thrombosis)   Assessment: This is a 79 year old Caucasian female with a past medical history as stated earlier, who presents with right leg swelling. She was diagnosed as having a right lower extremity DVT,  which is quite extensive. She was on a road trip last week, which is the most likely reason for her DVT.  Plan: #1 Right lower extremity DVT with extensive clot burden: She'll be placed on intravenous heparin. Interventional radiology has been called. They will reevaluate her tomorrow to discuss thrombolysis. Blood work will be obtained today.  #2 History of lung transplant for COPD: Stable. She is on multiple immunosuppressants including steroids, which will be continued.  #3 history of chronic diastolic heart failure and aortic stenosis: Both are stable. Continue to monitor.  #4 history of essential hypertension: Blood pressure is elevated. Resume her home medication regimen. Hydralazine as needed.  #5 history of chronic kidney disease stage IV: Await blood work. Baseline creatinine appears to be between 2.5 and 3.  Her home medication list stated Keflex. This was apparently started sometime in February. We will hold it for now. This will need to be addressed with the patient.  DVT Prophylaxis: On full anticoagulation Code Status: Full code Family Communication: Discussed with the patient and her 2 daughters. One of the daughter is a Marine scientist who works with the [employer] in [place].  Disposition Plan: Admit and for IR consultation tomorrow.   Further management decisions will depend on results of further  testing and patient's response to treatment.   [facility]  [facility] Pager [phone]  If 7PM-7AM, please contact night-coverage [email or web address] Password [email or web address]  08/24/2014, 5:13 PM

## 2014-08-24 NOTE — Assessment & Plan Note (Addendum)
New.  Pt has extensive R LE DVT, majority of which is occlusive.  Pt may need thrombolysis.  Unable to start novel anticoagulant due to pt's renal insufficiency (Stage 4 renal disease).  Pt will need admission to hospital for complete evaluation and tx.  Arranged via hospitalist for pt to be direct admit to [facility].  Pt expressed understanding and is in agreement w/ plan.

## 2014-08-24 NOTE — Patient Instructions (Signed)
We'll notify you of your ultrasound results to determine if there is a blood clot- we'll determine the next steps once results are available I am attempting to expand your home health services You must participate with wound care and physical therapy to improve your fall risk Please look into assisted living options as things continue to deterioriate Call with any questions or concerns Hang in there!!!

## 2014-08-24 NOTE — Assessment & Plan Note (Signed)
New dx for pt.  Has been evaluated at [facility].  In process of complete neuro workup.  Pt is not answering questions appropriately today in office but is pleasant and able to carry on a conversation.  Daughter states report was mailed to me- will review once available.

## 2014-08-24 NOTE — Assessment & Plan Note (Signed)
Pt fell in parking lot this AM while coming for office visit.  Suffered skin tear to R anterior lower leg.  Pt has had multiple falls recently.  Again discussed need for home health PT w/ family vs assisted living (did discuss that she was likely headed that way regardless)

## 2014-08-24 NOTE — Telephone Encounter (Signed)
MD aware

## 2014-08-25 ENCOUNTER — Inpatient Hospital Stay (HOSPITAL_COMMUNITY): Admission: RE | Admit: 2014-08-25 | Payer: Medicare Other | Source: Ambulatory Visit

## 2014-08-25 DIAGNOSIS — I5032 Chronic diastolic (congestive) heart failure: Secondary | ICD-10-CM

## 2014-08-25 DIAGNOSIS — I350 Nonrheumatic aortic (valve) stenosis: Secondary | ICD-10-CM

## 2014-08-25 LAB — COMPREHENSIVE METABOLIC PANEL
ALK PHOS: 32 U/L — AB (ref 38–126)
ALT: 18 U/L (ref 14–54)
AST: 21 U/L (ref 15–41)
Albumin: 2.4 g/dL — ABNORMAL LOW (ref 3.5–5.0)
Anion gap: 7 (ref 5–15)
BILIRUBIN TOTAL: 0.9 mg/dL (ref 0.3–1.2)
BUN: 38 mg/dL — ABNORMAL HIGH (ref 6–20)
CO2: 20 mmol/L — AB (ref 22–32)
Calcium: 8 mg/dL — ABNORMAL LOW (ref 8.9–10.3)
Chloride: 109 mmol/L (ref 101–111)
Creatinine, Ser: 2.72 mg/dL — ABNORMAL HIGH (ref 0.44–1.00)
GFR calc non Af Amer: 16 mL/min — ABNORMAL LOW (ref >60)
GFR, EST AFRICAN AMERICAN: 18 mL/min — AB (ref >60)
GLUCOSE: 148 mg/dL — AB (ref 70–99)
POTASSIUM: 4.1 mmol/L (ref 3.5–5.1)
Sodium: 136 mmol/L (ref 135–145)
Total Protein: 4.7 g/dL — ABNORMAL LOW (ref 6.5–8.1)

## 2014-08-25 LAB — CBC
HCT: 27.3 % — ABNORMAL LOW (ref 36.0–46.0)
HEMOGLOBIN: 8.7 g/dL — AB (ref 12.0–15.0)
MCH: 31.9 pg (ref 26.0–34.0)
MCHC: 31.9 g/dL (ref 30.0–36.0)
MCV: 100 fL (ref 78.0–100.0)
Platelets: 190 10*3/uL (ref 150–400)
RBC: 2.73 MIL/uL — AB (ref 3.87–5.11)
RDW: 15.9 % — ABNORMAL HIGH (ref 11.5–15.5)
WBC: 9.1 10*3/uL (ref 4.0–10.5)

## 2014-08-25 LAB — HEPARIN LEVEL (UNFRACTIONATED)
Heparin Unfractionated: 0.92 IU/mL — ABNORMAL HIGH (ref 0.30–0.70)
Heparin Unfractionated: 0.57 IU/mL (ref 0.30–0.70)

## 2014-08-25 MED ORDER — MYCOPHENOLATE MOFETIL 250 MG PO CAPS
500.0000 mg | ORAL_CAPSULE | Freq: Two times a day (BID) | ORAL | Status: DC
  Administered 2014-08-25 – 2014-08-28 (×7): 500 mg via ORAL
  Filled 2014-08-25 (×6): qty 2

## 2014-08-25 MED ORDER — HEPARIN (PORCINE) IN NACL 100-0.45 UNIT/ML-% IJ SOLN
850.0000 [IU]/h | INTRAMUSCULAR | Status: DC
  Administered 2014-08-25 – 2014-08-27 (×2): 850 [IU]/h via INTRAVENOUS
  Filled 2014-08-25 (×4): qty 250

## 2014-08-25 NOTE — Care Management Note (Signed)
Case Management Note  Patient Details  Name: MARZELLE RUTTEN MRN: [id number] Date of Birth: 12/05/1935  Subjective/Objective:                   Patient from [place] , has [name] prior to admission  Action/Plan:   Expected Discharge Date:        08-28-14          Expected Discharge Plan:  [place]  In-House Referral:     Discharge planning Services     Post Acute Care Choice:    Choice offered to:     DME Arranged:    DME Agency:     HH Arranged:    [place] Agency:     Status of Service:     Medicare Important Message Given:  N/A - LOS <3 / Initial given by admissions Date Medicare IM Given:    Medicare IM give by:    Date Additional Medicare IM Given:    Additional Medicare Important Message give by:     If discussed at [place] of Stay Meetings, dates discussed:    Additional Comments:  Marilu Favre, RN 08/25/2014, 11:41 AM

## 2014-08-25 NOTE — Progress Notes (Signed)
[facility]                                                                              Patient Demographics  Nancy Blair, is a 79 y.o. female, DOB - September 24, 1935, MVH:[id number]  Admit date - 08/24/2014   Admitting Physician Bonnielee Haff, MD  Outpatient Primary MD for the patient is Annye Asa, MD  LOS - 1   No chief complaint on file.      Brief HPI    Patient is a 79 y.o. female with hypertension, chronic kidney disease stage IV, lung transplant for COPD about 4 years ago done at [facility], who took her road trip to [place] about 8 days ago without taking breaks. After she came back to [facility] within 2-3 days she started developing swelling in her right leg which was more than normal. She actually went to the urgent care center and was recommended to go to the emergency department for a vascular study. However, patient preferred to go back home and see her primary care physician. She went to her PCPs office on the day of admission and was referred for a venous Doppler which was positive for DVT. Patient denied any chest pain or shortness of breath. No nausea or vomiting. No fever, chills. Patient's daughter reported that she fell 3 weeks ago and she stepped out of the car. Patient is currently in the independent living facility with home health nursing and home PT.    Assessment & Plan    Principal Problem:   Right leg DVT - Doppler ultrasound showed diffuse acute right lower extremity DVT with clot from the calf to the common femoral vein - Patient was seen by interventional radiology who recommended anticoagulation. Discussed with Dr. Earleen Newport, IR, her symptoms are isolated to the calf region that will not be treated by intervention such as catheter directed thrombolysis/thrombectomy, there is risks of intervention including bleeding and renal failure. IVC filter can be alternative to anticoagulation. - Discussed in detail with patient's daughter at the  bedside regarding various anticoagulation options however due to her CKD stage IV, she is not a candidate for NOAC's. Only option would be Coumadin at this time. Currently on heparin drip. Currently patient is in the independent living facility and due to falls and needs anticoagulation, she needs to be upgraded to skilled nursing facility for supervision. Patient and her daughter agree with the skilled nursing facility, placed SW consult. Explained about Lovenox and Coumadin, Lovenox injection can be done in skilled nursing facility and patient will not need to be in the hospital for 5 days. - Patient's daughter wants to discuss in detail with her sister who is health power of attorney and is RN and wants to make a choice regarding anticoagulation versus IVC filter. Patient and her daughter understand the risk of anticoagulation including bleeding/GI bleeding or falls with possibility of intracranial bleed. She will let us know after she discusses with her sister.  Active Problems:   Hypothyroidism - Continue Synthroid  Lung transplant - Continue multiple immunosuppressants including steroids, cyclosporine, CellCept  Chronic diastolic CHF, aortic stenosis - Currently stable, euvolemic, follow closely  Essential hypertension  - Continue antihypertensives hydralazine, Imdur, Coreg, Lasix  Chronic kidney stage IV  - Baseline creatinine appears to be between 2.5-3    Code Status: Full code  Family Communication: Discussed in detail with the patient, all imaging results, lab results explained to the patient and patient's daughter at the bedside    Disposition Plan: Awaiting decision regarding anticoagulation  Time Spent in minutes  25 minutes  Procedures  Doppler ultrasound of the lower extremity  Consults   Interventional radiology  DVT Prophylaxis heparin drip  Medications  Scheduled Meds: . amLODipine  2.5 mg Oral Daily  . aspirin  81 mg Oral Daily  . carvedilol  6.25 mg Oral  BID WC  . citalopram  40 mg Oral Daily  . cycloSPORINE modified  125 mg Oral BID  . fenofibrate  160 mg Oral Daily  . ferrous sulfate  325 mg Oral Q breakfast  . [START ON 08/26/2014] furosemide  20 mg Oral Q M,W,F  . hydrocortisone  20 mg Oral BID  . isosorbide mononitrate  15 mg Oral Daily  . levothyroxine  100 mcg Oral QAC breakfast  . multivitamin with minerals  1 tablet Oral Daily  . mycophenolate  500 mg Oral BID  . pravastatin  20 mg Oral QHS  . sodium chloride  3 mL Intravenous Q12H  . [START ON 08/26/2014] sulfamethoxazole-trimethoprim  1 tablet Oral Q M,W,F  . [START ON 08/26/2014] valGANciclovir  450 mg Oral Q M,W,F   Continuous Infusions: . heparin 850 Units/hr (08/25/14 0355)   PRN Meds:.sodium chloride, acetaminophen **OR** acetaminophen, albuterol, guaiFENesin, hydrALAZINE, ondansetron **OR** ondansetron (ZOFRAN) IV, oxyCODONE, sodium chloride   Antibiotics   Anti-infectives    Start     Dose/Rate Route Frequency Ordered Stop   08/26/14 1000  sulfamethoxazole-trimethoprim (BACTRIM,SEPTRA) 400-80 MG per tablet 1 tablet     1 tablet Oral Every M-W-F 08/24/14 1713     08/26/14 1000  valGANciclovir (VALCYTE) 450 MG tablet TABS 450 mg     450 mg Oral Every M-W-F 08/24/14 1713          Subjective:   [name] was seen and examined today.  Patient denies dizziness, chest pain, shortness of breath, abdominal pain, N/V/D/C, new weakness, numbess, tingling. No acute events overnight.    Objective:   Blood pressure 171/89, pulse 85, temperature 97.6 F (36.4 C), temperature source Oral, resp. rate 20, height 5' (1.524 m), weight 64.093 kg (141 lb 4.8 oz), SpO2 93 %.  Wt Readings from Last 3 Encounters:  08/25/14 64.093 kg (141 lb 4.8 oz)  05/01/14 61.054 kg (134 lb 9.6 oz)  04/29/14 48.081 kg (106 lb)     Intake/Output Summary (Last 24 hours) at 08/25/14 1150 Last data filed at 08/25/14 0800  Gross per 24 hour  Intake 231.61 ml  Output      0 ml  Net  231.61 ml    Exam  General: Alert and oriented x 3, NAD  HEENT:  PERRLA, EOMI, Anicteic Sclera, mucous membranes moist.   Neck: Supple, no JVD, no masses  CVS: S1 S2 auscultated, no rubs, murmurs or gallops. Regular rate and rhythm.  Respiratory: Clear to auscultation bilaterally, no wheezing, rales or rhonchi  Abdomen: Soft, nontender, nondistended, + bowel sounds  Ext: no cyanosis clubbing,+ edema in the right lower extremity  Neuro: AAOx3, Cr N's II- XII. Strength 5/5 upper and lower extremities bilaterally  Skin: No rashes  Psych: Normal affect and demeanor, alert and oriented x3  Data Review   Micro Results No results found for this or any previous visit (from the past 240 hour(s)).  Radiology Reports US Venous Img Lower Unilateral Right  08/24/2014   CLINICAL DATA:  Varicose veins strip from both legs. History of bilateral lung transplant. Right lower leg swelling for 1 week.  EXAM: Right LOWER EXTREMITY VENOUS DOPPLER ULTRASOUND  TECHNIQUE: Gray-scale sonography with graded compression, as well as color Doppler and duplex ultrasound were performed to evaluate the lower extremity deep venous systems from the level of the common femoral vein and including the common femoral, femoral, profunda femoral, popliteal and calf veins including the posterior tibial, peroneal and gastrocnemius veins when visible. The superficial great saphenous vein was also interrogated. Spectral Doppler was utilized to evaluate flow at rest and with distal augmentation maneuvers in the common femoral, femoral and popliteal veins.  COMPARISON:  None.  FINDINGS: There is hypoechoic, expansile thrombus throughout the right deep venous system, extending from the calf veins veins through the femoral vein into the common femoral vein. The majority of the thrombosis is complete/occlusive. Mobile luminal clot present at the level of the common femoral vein. The contralateral common femoral vein has normal  respiratory phasicity and no evidence of thrombus.  The patient returned to ordering physician's office to await results in a wheelchair. This study is marked for technologist call report.  IMPRESSION: Diffuse acute right lower extremity deep venous thrombosis with clot from the calf into the common femoral vein. The majority of the thrombosis is occlusive, consider IR clinic referral for thrombolysis evaluation.   Electronically Signed   By: Monte Fantasia M.D.   On: 08/24/2014 13:29    CBC  Recent Labs Lab 08/24/14 1819 08/25/14 0220  WBC 8.8 9.1  HGB 8.8* 8.7*  HCT 28.1* 27.3*  PLT 191 190  MCV 101.8* 100.0  MCH 31.9 31.9  MCHC 31.3 31.9  RDW 15.9* 15.9*    Chemistries   Recent Labs Lab 08/24/14 1819 08/25/14 0220  NA 137 136  K 4.2 4.1  CL 111 109  CO2 21* 20*  GLUCOSE 125* 148*  BUN 38* 38*  CREATININE 2.95* 2.72*  CALCIUM 8.0* 8.0*  AST 22 21  ALT 19 18  ALKPHOS 35* 32*  BILITOT 0.9 0.9   ------------------------------------------------------------------------------------------------------------------ estimated creatinine clearance is 14.7 mL/min (by C-G formula based on Cr of 2.72). ------------------------------------------------------------------------------------------------------------------ No results for input(s): HGBA1C in the last 72 hours. ------------------------------------------------------------------------------------------------------------------ No results for input(s): CHOL, HDL, LDLCALC, TRIG, CHOLHDL, LDLDIRECT in the last 72 hours. ------------------------------------------------------------------------------------------------------------------ No results for input(s): TSH, T4TOTAL, T3FREE, THYROIDAB in the last 72 hours.  Invalid input(s): FREET3 ------------------------------------------------------------------------------------------------------------------ No results for input(s): VITAMINB12, FOLATE, FERRITIN, TIBC, IRON, RETICCTPCT in  the last 72 hours.  Coagulation profile  Recent Labs Lab 08/24/14 1819  INR 1.17    No results for input(s): DDIMER in the last 72 hours.  Cardiac Enzymes No results for input(s): CKMB, TROPONINI, MYOGLOBIN in the last 168 hours.  Invalid input(s): CK ------------------------------------------------------------------------------------------------------------------ Invalid input(s): POCBNP  No results for input(s): GLUCAP in the last 72 hours.   Jory Tanguma M.D. [facility] 08/25/2014, 11:50 AM  Pager: [phone]   Between 7am to 7pm - call Pager - [phone]  After 7pm go to [email or web address] - password [email or web address]  Call night coverage person covering after 7pm

## 2014-08-25 NOTE — Progress Notes (Signed)
Chief Complaint: DVT Referring Physician: Dr. Tana Coast HPI: Nancy Blair is an 79 y.o. female with Right leg DVT. She had recently traveled to [place] to visit her daughter.  She states she was in the car for 5 hours.  She states she noticed swelling of the right leg a couple of days ago.   Venous doppler showed DVT in the calf up to the CFV. She denies any previous h/o DVT. She has CKD stage 4 Her daughter is present and states she is a fall risk and was worried about anticoagulation therapy. We are asked to evaluate her for Thrombolytic therapy.  Past Medical History:  Past Medical History  Diagnosis Date  . Emphysema of lung   . Hypertension   . Hyperlipidemia   . Allergy   . Anemia   . GERD (gastroesophageal reflux disease)   . Adrenal insufficiency   . Squamous acanthoma of skin 06/2011    rt leg  . Hypothyroidism   . CKD (chronic kidney disease) stage 4, GFR 15-29 ml/min     since 2012 (per [facility] records)  . Lung transplant status, bilateral   . Acute on chronic diastolic heart failure   . Ejection fraction   . Diabetes mellitus without complication     Past Surgical History:  Past Surgical History  Procedure Laterality Date  . Appendectomy    . Abdominal hysterectomy    . Tonsillectomy    . Total lung replacement  2012    b/l lung transplant  . Esophagogastroduodenoscopy  05/26/2011    Procedure: ESOPHAGOGASTRODUODENOSCOPY (EGD);  Surgeon: Owens Loffler, MD;  Location: [facility];  Service: Endoscopy;  Laterality: N/A;  . Nissen fundoplication      Family History:  Family History  Problem Relation Age of Onset  . Heart disease Mother   . Heart disease Father   . Pancreatic cancer Sister   . Colon cancer Neg Hx   . Malignant hyperthermia Neg Hx   . Hypertension Mother   . Hypertension Father     Social History:  reports that she quit smoking about 21 years ago. She started smoking about 62 years ago. She has never used smokeless tobacco. She reports  that she does not drink alcohol or use illicit drugs.  Allergies:  Allergies  Allergen Reactions  . Grapefruit Extract Other (See Comments)    Cannot take with meds  . Oysters [Shellfish Allergy] Swelling  . Pineapple Swelling  . Bupropion Other (See Comments)    sleepy  . Neomycin-Bacitracin Zn-Polymyx Hives and Other (See Comments)    Blisters (pt has used small amounts without a reaction)  . Penicillins Swelling    Facial swelling  . Prednisone Other (See Comments)    crazy    Medications:   Medication List    ASK your doctor about these medications        amLODipine 2.5 MG tablet  Commonly known as:  NORVASC  Take 1 tablet (2.5 mg total) by mouth daily.     aspirin 81 MG chewable tablet  Chew 81 mg by mouth daily.     carvedilol 6.25 MG tablet  Commonly known as:  COREG  Take 1 tablet (6.25 mg total) by mouth 2 (two) times daily with a meal.     cephALEXin 250 MG capsule  Commonly known as:  KEFLEX  Take 1 capsule (250 mg total) by mouth 3 (three) times daily.     citalopram 40 MG tablet  Commonly known as:  CELEXA  TAKE 1 TABLET BY MOUTH DAILY     cycloSPORINE modified 25 MG capsule  Commonly known as:  NEORAL  Take 125 mg by mouth 2 (two) times daily.     diphenoxylate-atropine 2.5-0.025 MG per tablet  Commonly known as:  LOMOTIL  Take 1-2 tablets by mouth See admin instructions. Take 2 tablets daily at lunch (2pm), may take 1 more tablet later in the day as needed for diarrhea     fenofibrate 160 MG tablet  TAKE ONE TABLET BY MOUTH EVERY DAY     ferrous sulfate 325 (65 FE) MG tablet  Take 325 mg by mouth daily.     furosemide 20 MG tablet  Commonly known as:  LASIX  Take 1 tablet (20 mg total) by mouth every other day. Takes on Monday, Wednesday, and Friday     hydrocortisone 20 MG tablet  Commonly known as:  CORTEF  Take 20 mg by mouth 2 (two) times daily.     isosorbide mononitrate 30 MG 24 hr tablet  Commonly known as:  IMDUR  Take 0.5  tablets (15 mg total) by mouth daily.     levothyroxine 100 MCG tablet  Commonly known as:  SYNTHROID, LEVOTHROID  Take 1 tablet (100 mcg total) by mouth daily.     multivitamin with minerals Tabs tablet  Take 1 tablet by mouth daily.     mycophenolate 500 MG tablet  Commonly known as:  CELLCEPT  Take 500 mg by mouth 2 (two) times daily.     Potassium Chloride ER 20 MEQ Tbcr  TAKE 1 TABLET BY MOUTH ONCE DAILY     potassium chloride SA 20 MEQ tablet  Commonly known as:  K-DUR,KLOR-CON  Take 1 tablet (20 mEq total) by mouth daily.     pravastatin 20 MG tablet  Commonly known as:  PRAVACHOL  TAKE 1 TABLET BY MOUTH EVERY NIGHT AT BEDTIME     sulfamethoxazole-trimethoprim 400-80 MG per tablet  Commonly known as:  BACTRIM,SEPTRA  Take 1 tablet by mouth every Monday, Wednesday, and Friday.     valGANciclovir 450 MG tablet  Commonly known as:  VALCYTE  Take 450 mg by mouth every Monday, Wednesday, and Friday.        Please HPI for pertinent positives, otherwise complete 10 system ROS negative.  Physical Exam: BP 150/85 mmHg  Pulse 85  Temp(Src) 97.6 F (36.4 C) (Oral)  Resp 20  Ht 5' (1.524 m)  Wt 141 lb 4.8 oz (64.093 kg)  BMI 27.60 kg/m2  SpO2 93% Body mass index is 27.6 kg/(m^2).   General Appearance:  Alert, cooperative, no distress, appears stated age  Head:  Normocephalic, without obvious abnormality, atraumatic  ENT: Unremarkable  Neck: Trachea midline  Abdomen:   Soft, non distended.  Extremities: Right leg with 2+ edema. Right leg with bandage in place. Left Extremity is normal, atraumatic, no cyanosis or edema  Pulses: 2+ and symmetric  Neurologic: Normal affect, no gross deficits.  Labs: Results for orders placed or performed during the hospital encounter of 08/24/14 (from the past 48 hour(s))  Comprehensive metabolic panel     Status: Abnormal   Collection Time: 08/24/14  6:19 PM  Result Value Ref Range   Sodium 137 135 - 145 mmol/L   Potassium 4.2  3.5 - 5.1 mmol/L   Chloride 111 101 - 111 mmol/L   CO2 21 (L) 22 - 32 mmol/L   Glucose, Bld 125 (H) 70 - 99 mg/dL   BUN 38 (H) 6 -  20 mg/dL   Creatinine, Ser 2.95 (H) 0.44 - 1.00 mg/dL   Calcium 8.0 (L) 8.9 - 10.3 mg/dL   Total Protein 5.3 (L) 6.5 - 8.1 g/dL   Albumin 2.4 (L) 3.5 - 5.0 g/dL   AST 22 15 - 41 U/L   ALT 19 14 - 54 U/L   Alkaline Phosphatase 35 (L) 38 - 126 U/L   Total Bilirubin 0.9 0.3 - 1.2 mg/dL   GFR calc non Af Amer 14 (L) >60 mL/min   GFR calc Af Amer 17 (L) >60 mL/min    Comment: (NOTE) The eGFR has been calculated using the CKD EPI equation. This calculation has not been validated in all clinical situations. eGFR's persistently <60 mL/min signify possible Chronic Kidney Disease.    Anion gap 5 5 - 15  CBC     Status: Abnormal   Collection Time: 08/24/14  6:19 PM  Result Value Ref Range   WBC 8.8 4.0 - 10.5 K/uL   RBC 2.76 (L) 3.87 - 5.11 MIL/uL   Hemoglobin 8.8 (L) 12.0 - 15.0 g/dL   HCT 28.1 (L) 36.0 - 46.0 %   MCV 101.8 (H) 78.0 - 100.0 fL   MCH 31.9 26.0 - 34.0 pg   MCHC 31.3 30.0 - 36.0 g/dL   RDW 15.9 (H) 11.5 - 15.5 %   Platelets 191 150 - 400 K/uL  Protime-INR     Status: None   Collection Time: 08/24/14  6:19 PM  Result Value Ref Range   Prothrombin Time 15.0 11.6 - 15.2 seconds   INR 1.17 0.00 - 1.49  APTT     Status: Abnormal   Collection Time: 08/24/14  6:19 PM  Result Value Ref Range   aPTT 40 (H) 24 - 37 seconds    Comment:        IF BASELINE aPTT IS ELEVATED, SUGGEST PATIENT RISK ASSESSMENT BE USED TO DETERMINE APPROPRIATE ANTICOAGULANT THERAPY.   Comprehensive metabolic panel     Status: Abnormal   Collection Time: 08/25/14  2:20 AM  Result Value Ref Range   Sodium 136 135 - 145 mmol/L   Potassium 4.1 3.5 - 5.1 mmol/L   Chloride 109 101 - 111 mmol/L   CO2 20 (L) 22 - 32 mmol/L   Glucose, Bld 148 (H) 70 - 99 mg/dL   BUN 38 (H) 6 - 20 mg/dL   Creatinine, Ser 2.72 (H) 0.44 - 1.00 mg/dL   Calcium 8.0 (L) 8.9 - 10.3 mg/dL    Total Protein 4.7 (L) 6.5 - 8.1 g/dL   Albumin 2.4 (L) 3.5 - 5.0 g/dL   AST 21 15 - 41 U/L   ALT 18 14 - 54 U/L   Alkaline Phosphatase 32 (L) 38 - 126 U/L   Total Bilirubin 0.9 0.3 - 1.2 mg/dL   GFR calc non Af Amer 16 (L) >60 mL/min   GFR calc Af Amer 18 (L) >60 mL/min    Comment: (NOTE) The eGFR has been calculated using the CKD EPI equation. This calculation has not been validated in all clinical situations. eGFR's persistently <60 mL/min signify possible Chronic Kidney Disease.    Anion gap 7 5 - 15  CBC     Status: Abnormal   Collection Time: 08/25/14  2:20 AM  Result Value Ref Range   WBC 9.1 4.0 - 10.5 K/uL   RBC 2.73 (L) 3.87 - 5.11 MIL/uL   Hemoglobin 8.7 (L) 12.0 - 15.0 g/dL   HCT 27.3 (L) 36.0 - 46.0 %  MCV 100.0 78.0 - 100.0 fL   MCH 31.9 26.0 - 34.0 pg   MCHC 31.9 30.0 - 36.0 g/dL   RDW 15.9 (H) 11.5 - 15.5 %   Platelets 190 150 - 400 K/uL  Heparin level (unfractionated)     Status: Abnormal   Collection Time: 08/25/14  2:20 AM  Result Value Ref Range   Heparin Unfractionated 0.92 (H) 0.30 - 0.70 IU/mL    Comment:        IF HEPARIN RESULTS ARE BELOW EXPECTED VALUES, AND PATIENT DOSAGE HAS BEEN CONFIRMED, SUGGEST FOLLOW UP TESTING OF ANTITHROMBIN III LEVELS.     Imaging: US Venous Img Lower Unilateral Right  08/24/2014   CLINICAL DATA:  Varicose veins strip from both legs. History of bilateral lung transplant. Right lower leg swelling for 1 week.  EXAM: Right LOWER EXTREMITY VENOUS DOPPLER ULTRASOUND  TECHNIQUE: Gray-scale sonography with graded compression, as well as color Doppler and duplex ultrasound were performed to evaluate the lower extremity deep venous systems from the level of the common femoral vein and including the common femoral, femoral, profunda femoral, popliteal and calf veins including the posterior tibial, peroneal and gastrocnemius veins when visible. The superficial great saphenous vein was also interrogated. Spectral Doppler was utilized  to evaluate flow at rest and with distal augmentation maneuvers in the common femoral, femoral and popliteal veins.  COMPARISON:  None.  FINDINGS: There is hypoechoic, expansile thrombus throughout the right deep venous system, extending from the calf veins veins through the femoral vein into the common femoral vein. The majority of the thrombosis is complete/occlusive. Mobile luminal clot present at the level of the common femoral vein. The contralateral common femoral vein has normal respiratory phasicity and no evidence of thrombus.  The patient returned to ordering physician's office to await results in a wheelchair. This study is marked for technologist call report.  IMPRESSION: Diffuse acute right lower extremity deep venous thrombosis with clot from the calf into the common femoral vein. The majority of the thrombosis is occlusive, consider IR clinic referral for thrombolysis evaluation.   Electronically Signed   By: Monte Fantasia M.D.   On: 08/24/2014 13:29    Assessment/Plan Pt seen, examined, and plan discussed by Dr. Rolla Plate.  Acute DVT in the right Calf vein up to the CFV.  Iliac vein is patent. CKD stage 4 Fall Risk Given this patients co-morbidities, including CKD 4, Benefit of thrombolysis/thrombectomy does NOT outweigh risks. Daughter and Patient agree.  There will be further discussion regarding possible IVC filter placement if it is felt patient is too high risk for anticoagulation with Xarelto or Warfarin.  We are happy to help with this.    Gareth Eagle R PA-C 08/25/2014, 9:18 AM

## 2014-08-25 NOTE — Progress Notes (Signed)
ANTICOAGULATION CONSULT NOTE - Follow Up Consult  Pharmacy Consult for Heparin Indication: DVT, right leg, extensive  Allergies  Allergen Reactions  . Grapefruit Extract Other (See Comments)    Cannot take with meds  . Oysters [Shellfish Allergy] Swelling  . Pineapple Swelling  . Bupropion Other (See Comments)    sleepy  . Neomycin-Bacitracin Zn-Polymyx Hives and Other (See Comments)    Blisters (pt has used small amounts without a reaction)  . Penicillins Swelling    Facial swelling  . Prednisone Other (See Comments)    crazy    Patient Measurements: Height: 5' (152.4 cm) Weight: 140 lb 12.8 oz (63.866 kg) IBW/kg (Calculated) : 45.5 Heparin Dosing Weight: 63.9 kg  Vital Signs: Temp: 97.7 F (36.5 C) (05/09 2050) Temp Source: Oral (05/09 2050) BP: 178/78 mmHg (05/09 2050) Pulse Rate: 93 (05/09 2050)  Labs:  Recent Labs  08/24/14 1819 08/25/14 0220  HGB 8.8* 8.7*  HCT 28.1* 27.3*  PLT 191 190  APTT 40*  --   LABPROT 15.0  --   INR 1.17  --   HEPARINUNFRC  --  0.92*  CREATININE 2.95*  --     Estimated Creatinine Clearance: 13.5 mL/min (by C-G formula based on Cr of 2.95).     Assessment: 79 y.o female on heparin for extensive right leg DVT.  8 hr heparin level = 0.92 on heparin 1000 units/hr. supratherapeutic heparin level. May still be reflecting some of bolus in this 79 y.o. Hgb 8.7, low stable, pltc stable at 190K No bleeding noted  Goal of Therapy:  Heparin level 0.3-0.7 units/ml Monitor platelets by anticoagulation protocol: Yes   Plan:  Decrease heparin rate to 850 units/hr F/u 6 hour heparin level  Daily heparin level and CBC while on heparin. Will follow up plans +/- lysis.   Nicole Cella, RPh Clinical Pharmacist Pager: [phone] 08/25/2014,3:46 AM

## 2014-08-25 NOTE — Progress Notes (Signed)
Nutrition Brief Note  Patient identified on the Malnutrition Screening Tool (MST) Report  Wt Readings from Last 15 Encounters:  08/25/14 141 lb 4.8 oz (64.093 kg)  05/01/14 134 lb 9.6 oz (61.054 kg)  04/29/14 106 lb (48.081 kg)  02/02/14 142 lb 6 oz (64.581 kg)  02/02/14 142 lb 6.4 oz (64.592 kg)  09/05/13 137 lb (62.143 kg)  08/13/13 138 lb 6 oz (62.766 kg)  08/12/13 138 lb 6.4 oz (62.778 kg)  07/24/13 137 lb 2 oz (62.199 kg)  07/21/13 135 lb 9.6 oz (61.508 kg)  11/11/12 130 lb (58.968 kg)  09/02/12 130 lb 3.2 oz (59.058 kg)  08/08/12 134 lb (60.782 kg)  07/19/12 132 lb (59.875 kg)  02/07/12 127 lb 3.3 oz (57.7 kg)   Nancy Blair is a 79 y.o. female with a past medical history of hypertension, chronic kidney disease stage IV, lung transplant for COPD about 4 years ago done at [facility], who took her road trip to [place] about 8 days ago without taking breaks. After she came back to [facility] within 2-3 days she started developing swelling in her right leg which was more than normal. She actually went to the urgent care center and was recommended to go to the emergency department for a vascular study. However, patient preferred to go back home and see her primary care physician today. She went to her PCPs office today and was referred for a venous Doppler which was positive for DVT. Patient denies any chest pain or shortness of breath. No nausea or vomiting. No fever, chills. Her daughter is at the bedside and she reports that the patient fell twice. According to the daughter, she has unsteady gait and doesn't get around much.  Pt reports good appetite PTA. She denies weight loss and reports are consistent with wt hx. She reports following a regular diet at home and denies difficulty chewing or swallowing or recent changes in diet. She is anxious to eat. Spoke with RN who reports NPO for potential IVC filter placement.   Body mass index is 27.6 kg/(m^2). Patient meets criteria for  overweight based on current BMI.   Current diet order is NPO, patient is consuming approximately n/a% of meals at this time. Labs and medications reviewed.   No nutrition interventions warranted at this time. If nutrition issues arise, please consult RD.   Fortunato Nordin A. Jimmye Norman, RD, LDN, CDE Pager: [phone] After hours Pager: [phone]

## 2014-08-25 NOTE — Progress Notes (Signed)
ANTICOAGULATION CONSULT NOTE - Follow Up Consult  Pharmacy Consult for Heparin Indication: DVT, right leg, extensive  Allergies  Allergen Reactions  . Grapefruit Extract Other (See Comments)    Cannot take with meds  . Oysters [Shellfish Allergy] Swelling  . Pineapple Swelling  . Bupropion Other (See Comments)    sleepy  . Neomycin-Bacitracin Zn-Polymyx Hives and Other (See Comments)    Blisters (pt has used small amounts without a reaction)  . Penicillins Swelling    Facial swelling  . Prednisone Other (See Comments)    crazy    Patient Measurements: Height: 5' (152.4 cm) Weight: 141 lb 4.8 oz (64.093 kg) IBW/kg (Calculated) : 45.5 Heparin Dosing Weight: 63.9 kg  Vital Signs: Temp: 97.6 F (36.4 C) (05/10 0508) Temp Source: Oral (05/10 0508) BP: 171/89 mmHg (05/10 0930) Pulse Rate: 85 (05/10 0508)  Labs:  Recent Labs  08/24/14 1819 08/25/14 0220 08/25/14 1141  HGB 8.8* 8.7*  --   HCT 28.1* 27.3*  --   PLT 191 190  --   APTT 40*  --   --   LABPROT 15.0  --   --   INR 1.17  --   --   HEPARINUNFRC  --  0.92* 0.57  CREATININE 2.95* 2.72*  --     Estimated Creatinine Clearance: 14.7 mL/min (by C-G formula based on Cr of 2.72).  Assessment: 79 y.o female on heparin for extensive right leg DVT. IR will not perform intervention such as catheter directed thrombolysis/thrombectomy b/c isolated to calf region. Not a candidate for NOACs 2nd CKD stage IV. At independent living, to be upgraded to skilled for supervision for falls. Family considering LMWH/coumadin vs IVC filter. First HL 0.92 may reflect bolus, rate decreased to 850 at 04 am, and recheck HL = 0.57 which is therapeutic.  Hg 8.7, pltc 190 stable, no bleeding reported. Creat 2.27   Goal of Therapy:  Heparin level 0.3-0.7 units/ml Monitor platelets by anticoagulation protocol: Yes   Plan:  Continue heparin rate at 850 units/hr Daily heparin level and CBC while on heparin. Will follow up family  decision: IVC Filter vs LMWH/coumadin  Eudelia Bunch, Pharm.D. [phone] 08/25/2014 1:16 PM

## 2014-08-26 LAB — BASIC METABOLIC PANEL
ANION GAP: 10 (ref 5–15)
BUN: 41 mg/dL — ABNORMAL HIGH (ref 6–20)
CHLORIDE: 108 mmol/L (ref 101–111)
CO2: 20 mmol/L — AB (ref 22–32)
CREATININE: 2.88 mg/dL — AB (ref 0.44–1.00)
Calcium: 8.1 mg/dL — ABNORMAL LOW (ref 8.9–10.3)
GFR calc Af Amer: 17 mL/min — ABNORMAL LOW (ref >60)
GFR calc non Af Amer: 15 mL/min — ABNORMAL LOW (ref >60)
Glucose, Bld: 188 mg/dL — ABNORMAL HIGH (ref 70–99)
Potassium: 4.2 mmol/L (ref 3.5–5.1)
Sodium: 138 mmol/L (ref 135–145)

## 2014-08-26 LAB — CBC
HCT: 26 % — ABNORMAL LOW (ref 36.0–46.0)
HEMOGLOBIN: 8.3 g/dL — AB (ref 12.0–15.0)
MCH: 32 pg (ref 26.0–34.0)
MCHC: 31.9 g/dL (ref 30.0–36.0)
MCV: 100.4 fL — AB (ref 78.0–100.0)
Platelets: 192 10*3/uL (ref 150–400)
RBC: 2.59 MIL/uL — ABNORMAL LOW (ref 3.87–5.11)
RDW: 15.6 % — AB (ref 11.5–15.5)
WBC: 7.2 10*3/uL (ref 4.0–10.5)

## 2014-08-26 LAB — HEPARIN LEVEL (UNFRACTIONATED): HEPARIN UNFRACTIONATED: 0.54 [IU]/mL (ref 0.30–0.70)

## 2014-08-26 MED ORDER — PATIENT'S GUIDE TO USING COUMADIN BOOK
Freq: Once | Status: AC
  Administered 2014-08-26: 19:00:00
  Filled 2014-08-26: qty 1

## 2014-08-26 MED ORDER — WARFARIN VIDEO
Freq: Once | Status: AC
  Administered 2014-08-26: 17:00:00

## 2014-08-26 MED ORDER — WARFARIN SODIUM 5 MG PO TABS
5.0000 mg | ORAL_TABLET | Freq: Once | ORAL | Status: AC
Start: 2014-08-26 — End: 2014-08-26
  Administered 2014-08-26: 5 mg via ORAL
  Filled 2014-08-26: qty 1

## 2014-08-26 MED ORDER — WARFARIN - PHARMACIST DOSING INPATIENT: Freq: Every day | Status: DC

## 2014-08-26 MED FILL — Mycophenolate Mofetil Cap 250 MG: ORAL | Qty: 2 | Status: AC

## 2014-08-26 NOTE — Evaluation (Signed)
Physical Therapy Evaluation Patient Details Name: Nancy Blair MRN: [id number] DOB: Jan 15, 1936 Today's Date: 08/26/2014   History of Present Illness  Nancy Blair is a 79 y.o. female with a past medical history of hypertension, chronic kidney disease stage IV, lung transplant for COPD about 4 years ago done at [facility]. She was admitted with an extensive RLE deep venous thrombosis.  Clinical Impression  Pt admitted with above diagnosis. Pt currently with functional limitations due to the deficits listed below (see PT Problem List). Ambulates generally well up to 175 feet with supervision, although reaches for rail in hallway and is easily fatigued with dyspnea (SpO2 97% on room air.) Feel she would benefit, and is agreeable to use of a cane for stability and confidence, along with home health PT to progress balance at d/c. Pt will benefit from skilled PT to increase their independence and safety with mobility to allow discharge to the venue listed below.       Follow Up Recommendations Home health PT    Equipment Recommendations  Cane    Recommendations for Other Services       Precautions / Restrictions Precautions Precautions: Fall Restrictions Weight Bearing Restrictions: No      Mobility  Bed Mobility Overal bed mobility: Modified Independent                Transfers Overall transfer level: Needs assistance Equipment used: None Transfers: Sit to/from Stand Sit to Stand: Supervision         General transfer comment: supervision for safety. mild sway but no physical assist needed from lowest bed setting  Ambulation/Gait Ambulation/Gait assistance: Supervision Ambulation Distance (Feet): 175 Feet Assistive device: None Gait Pattern/deviations: Step-through pattern;Decreased stride length;Decreased stance time - right;Antalgic Gait velocity: decreased   General Gait Details: Mildly antalgic gait pattern. Became dyspneic (SpO2 97% on room air) no overt loss  of balance noted however pt reaches for rail in halway at times. VC for stability awareness, and discussed energy conservation techniques.  Stairs            Wheelchair Mobility    Modified Rankin (Stroke Patients Only)       Balance Overall balance assessment: Needs assistance;History of Falls Sitting-balance support: No upper extremity supported;Feet supported Sitting balance-Leahy Scale: Good     Standing balance support: No upper extremity supported Standing balance-Leahy Scale: Fair                               Pertinent Vitals/Pain Pain Assessment: No/denies pain    Home Living Family/patient expects to be discharged to:: Assisted living Living Arrangements: Alone Available Help at Discharge: Family (sister and daughter live near-by) Type of Home: Independent living facility Home Access: Level entry     Home Layout: One level Home Equipment: Shower seat      Prior Function Level of Independence: Independent               Hand Dominance   Dominant Hand: Left    Extremity/Trunk Assessment   Upper Extremity Assessment: Defer to OT evaluation           Lower Extremity Assessment: RLE deficits/detail RLE Deficits / Details: extensive contusion throughout RLE with small skin tear anterior shin.       Communication   Communication: No difficulties  Cognition Arousal/Alertness: Awake/alert Behavior During Therapy: WFL for tasks assessed/performed Overall Cognitive Status: Within Functional Limits for tasks assessed  General Comments General comments (skin integrity, edema, etc.): Discussed importance of daily/frequent mobility, medical compliance, signs/symptoms of DVT/PE, and safety with mobility.    Exercises        Assessment/Plan    PT Assessment Patient needs continued PT services  PT Diagnosis Abnormality of gait   PT Problem List Decreased strength;Decreased activity  tolerance;Decreased balance;Decreased mobility;Decreased knowledge of use of DME  PT Treatment Interventions DME instruction;Gait training;Functional mobility training;Therapeutic exercise;Therapeutic activities;Balance training;Patient/family education   PT Goals (Current goals can be found in the Care Plan section) Acute Rehab PT Goals Patient Stated Goal: go home PT Goal Formulation: With patient Time For Goal Achievement: 09/09/14 Potential to Achieve Goals: Good    Frequency Min 3X/week   Barriers to discharge Decreased caregiver support lives alone. States family near-by and can help as needed.    Co-evaluation               End of Session   Activity Tolerance: Patient tolerated treatment well;No increased pain Patient left: in bed;with call bell/phone within reach Nurse Communication: Mobility status         Time: 1500-1516 PT Time Calculation (min) (ACUTE ONLY): 16 min   Charges:   PT Evaluation $Initial PT Evaluation Tier I: 1 Procedure     PT G CodesEllouise Newer 08/26/2014, 3:46 PM Camille Bal [place], [place]

## 2014-08-26 NOTE — Discharge Instructions (Signed)
Information on my medicine - Coumadin   (Warfarin)  This medication education was reviewed with me or my healthcare representative as part of my discharge preparation.  The pharmacist that spoke with me during my hospital stay was:  Eudelia Bunch, [facility]  Why was Coumadin prescribed for you? Coumadin was prescribed for you because you have a blood clot or a medical condition that can cause an increased risk of forming blood clots. Blood clots can cause serious health problems by blocking the flow of blood to the heart, lung, or brain. Coumadin can prevent harmful blood clots from forming. As a reminder your indication for Coumadin is:   Deep Vein Thrombosis Treatment  What test will check on my response to Coumadin? While on Coumadin (warfarin) you will need to have an INR test regularly to ensure that your dose is keeping you in the desired range. The INR (international normalized ratio) number is calculated from the result of the laboratory test called prothrombin time (PT).  If an INR APPOINTMENT HAS NOT ALREADY BEEN MADE FOR YOU please schedule an appointment to have this lab work done by your health care provider within 7 days. Your INR goal is usually a number between:  2 to 3 or your provider may give you a more narrow range like 2-2.5.  Ask your health care provider during an office visit what your goal INR is.  What  do you need to  know  About  COUMADIN? Take Coumadin (warfarin) exactly as prescribed by your healthcare provider about the same time each day.  DO NOT stop taking without talking to the doctor who prescribed the medication.  Stopping without other blood clot prevention medication to take the place of Coumadin may increase your risk of developing a new clot or stroke.  Get refills before you run out.  What do you do if you miss a dose? If you miss a dose, take it as soon as you remember on the same day then continue your regularly scheduled regimen the next day.  Do not take  two doses of Coumadin at the same time.  Important Safety Information A possible side effect of Coumadin (Warfarin) is an increased risk of bleeding. You should call your healthcare provider right away if you experience any of the following: ? Bleeding from an injury or your nose that does not stop. ? Unusual colored urine (red or dark brown) or unusual colored stools (red or black). ? Unusual bruising for unknown reasons. ? A serious fall or if you hit your head (even if there is no bleeding).  Some foods or medicines interact with Coumadin (warfarin) and might alter your response to warfarin. To help avoid this: ? Eat a balanced diet, maintaining a consistent amount of Vitamin K. ? Notify your provider about major diet changes you plan to make. ? Avoid alcohol or limit your intake to 1 drink for women and 2 drinks for men per day. (1 drink is 5 oz. wine, 12 oz. beer, or 1.5 oz. liquor.)  Make sure that ANY health care provider who prescribes medication for you knows that you are taking Coumadin (warfarin).  Also make sure the healthcare provider who is monitoring your Coumadin knows when you have started a new medication including herbals and non-prescription products.  Coumadin (Warfarin)  Major Drug Interactions  Increased Warfarin Effect Decreased Warfarin Effect  Alcohol (large quantities) Antibiotics (esp. Septra/Bactrim, Flagyl, Cipro) Amiodarone (Cordarone) Aspirin (ASA) Cimetidine (Tagamet) Megestrol (Megace) NSAIDs (ibuprofen, naproxen, etc.)  Piroxicam (Feldene) °Propafenone (Rythmol SR) °Propranolol (Inderal) °Isoniazid (INH) °Posaconazole (Noxafil) Barbiturates (Phenobarbital) °Carbamazepine (Tegretol) °Chlordiazepoxide (Librium) °Cholestyramine (Questran) °Griseofulvin °Oral Contraceptives °Rifampin °Sucralfate (Carafate) °Vitamin K  ° °Coumadin® (Warfarin) Major Herbal Interactions  °Increased Warfarin Effect Decreased Warfarin Effect  °Garlic °Ginseng °Ginkgo biloba  Coenzyme Q10 °Green tea °St. John’s wort   ° °Coumadin® (Warfarin) FOOD Interactions  °Eat a consistent number of servings per week of foods HIGH in Vitamin K °(1 serving = ½ cup)  °Collards (cooked, or boiled & drained) °Kale (cooked, or boiled & drained) °Mustard greens (cooked, or boiled & drained) °Parsley *serving size only = ¼ cup °Spinach (cooked, or boiled & drained) °Swiss chard (cooked, or boiled & drained) °Turnip greens (cooked, or boiled & drained)  °Eat a consistent number of servings per week of foods MEDIUM-HIGH in Vitamin K °(1 serving = 1 cup)  °Asparagus (cooked, or boiled & drained) °Broccoli (cooked, boiled & drained, or raw & chopped) °Brussel sprouts (cooked, or boiled & drained) *serving size only = ½ cup °Lettuce, raw (green leaf, endive, romaine) °Spinach, raw °Turnip greens, raw & chopped  ° °These websites have more information on Coumadin (warfarin):  www.coumadin.com; °www.ahrq.gov/consumer/coumadin.htm; ° ° °

## 2014-08-26 NOTE — Progress Notes (Signed)
ANTICOAGULATION CONSULT NOTE - Follow Up Consult  Pharmacy Consult for Heparin and coumadin Indication: DVT, right leg, extensive  Allergies  Allergen Reactions  . Grapefruit Extract Other (See Comments)    Cannot take with meds  . Oysters [Shellfish Allergy] Swelling  . Pineapple Swelling  . Bupropion Other (See Comments)    sleepy  . Neomycin-Bacitracin Zn-Polymyx Hives and Other (See Comments)    Blisters (pt has used small amounts without a reaction)  . Penicillins Swelling    Facial swelling  . Prednisone Other (See Comments)    crazy    Patient Measurements: Height: 5' (152.4 cm) Weight: 137 lb (62.143 kg) IBW/kg (Calculated) : 45.5 Heparin Dosing Weight: 63.9 kg  Vital Signs: Temp: 98.4 F (36.9 C) (05/11 0511) Temp Source: Oral (05/11 0511) BP: 129/58 mmHg (05/11 0933) Pulse Rate: 76 (05/11 0717)  Labs:  Recent Labs  08/24/14 1819 08/25/14 0220 08/25/14 1141 08/26/14 0349  HGB 8.8* 8.7*  --  8.3*  HCT 28.1* 27.3*  --  26.0*  PLT 191 190  --  192  APTT 40*  --   --   --   LABPROT 15.0  --   --   --   INR 1.17  --   --   --   HEPARINUNFRC  --  0.92* 0.57 0.54  CREATININE 2.95* 2.72*  --  2.88*    Estimated Creatinine Clearance: 13.7 mL/min (by C-G formula based on Cr of 2.88).  Assessment: 79 y.o female on heparin for extensive right leg DVT. IR will not perform intervention such as catheter directed thrombolysis/thrombectomy b/c isolated to calf region. Not a candidate for NOACs 2nd CKD stage IV. At independent living, to be upgraded to skilled for supervision for falls. To start coumadin today.  Coumadin score = 2.  Alb 2.4.  She is on septra DS MWF for lung transplant which may increase INR. HL therapeutic at 0.54 on heparin at 850 units/hr today. Hg 8.3 - stable, PLTC 192 stable, creat 2.88, stable.  No bleeding reported.   Day # 1/5 of minimum overlap for VTE core measures.    Goal of Therapy:  Heparin level 0.3-0.7 units/ml Monitor platelets  by anticoagulation protocol: Yes   Plan:  Continue heparin rate at 850 units/hr Daily heparin level and CBC while on heparin. Coumadin 5 mg po x 1 dose Coumadin book and video for education Daily INR Day #1/5 of minimum overlap for VTE core measure Eudelia Bunch, Pharm.D. [phone] 08/26/2014 1:25 PM

## 2014-08-26 NOTE — Progress Notes (Signed)
[facility]                                                                              Patient Demographics  Nancy Blair, is a 79 y.o. female, DOB - Aug 21, 1935, OVF:[id number]  Admit date - 08/24/2014   Admitting Physician Bonnielee Haff, MD  Outpatient Primary MD for the patient is Annye Asa, MD  LOS - 2   No chief complaint on file.      Brief HPI    Patient is a 79 y.o. female with hypertension, chronic kidney disease stage IV, lung transplant for COPD about 4 years ago done at [facility], who took her road trip to [place] about 8 days ago without taking breaks. After she came back to [facility] within 2-3 days she started developing swelling in her right leg which was more than normal. She actually went to the urgent care center and was recommended to go to the emergency department for a vascular study. However, patient preferred to go back home and see her primary care physician. She went to her PCPs office on the day of admission and was referred for a venous Doppler which was positive for DVT. Patient denied any chest pain or shortness of breath. No nausea or vomiting. No fever, chills. Patient's daughter reported that she fell 3 weeks ago and she stepped out of the car. Patient is currently in the independent living facility with home health nursing and home PT.    Assessment & Plan    Principal Problem:   Right leg DVT - Doppler ultrasound showed diffuse acute right lower extremity DVT with clot from the calf to the common femoral vein - Patient was seen by interventional radiology who recommended anticoagulation. Discussed with Dr. Earleen Newport, IR, her symptoms are isolated to the calf region that will not be treated by intervention such as catheter directed thrombolysis/thrombectomy, there is risks of intervention including bleeding and renal failure. IVC filter can be alternative to anticoagulation. - Discussed in detail with patient's daughter,  Altha Harm on the phone I had explained various anticoagulation options yesterday. Due to her CKD stage IV, she is not a candidate for NOAC's. Patient's daughter Altha Harm is okay with starting Coumadin at this time, requested continue heparin drip and changed to Lovenox when she is ready for discharge.  - Patient's daughter is also hoping if patient can continued staying at the same facility and they can arrange PT. I recommended her that patient may need more supervision especially starting anticoagulation at this time and her history of falls. Will await PT evaluation today - Start Coumadin per pharmacy  Active Problems:   Hypothyroidism - Continue Synthroid  Lung transplant - Continue multiple immunosuppressants including steroids, cyclosporine, CellCept  Chronic diastolic CHF, aortic stenosis - Currently stable, euvolemic, follow closely  Essential hypertension  - Continue antihypertensives hydralazine, Imdur, Coreg, Lasix  Chronic kidney stage IV  - Baseline creatinine appears to be between 2.5-3    Code Status: Full code  Family Communication: Discussed in detail with the patient, all imaging results, lab results explained to the patient and patient's daughter    Disposition Plan:   Time  Spent in minutes  25 minutes  Procedures  Doppler ultrasound of the lower extremity  Consults   Interventional radiology  DVT Prophylaxis heparin drip and Coumadin  Medications  Scheduled Meds: . amLODipine  2.5 mg Oral Daily  . aspirin  81 mg Oral Daily  . carvedilol  6.25 mg Oral BID WC  . citalopram  40 mg Oral Daily  . cycloSPORINE modified  125 mg Oral BID  . fenofibrate  160 mg Oral Daily  . ferrous sulfate  325 mg Oral Q breakfast  . furosemide  20 mg Oral Q M,W,F  . hydrocortisone  20 mg Oral BID  . isosorbide mononitrate  15 mg Oral Daily  . levothyroxine  100 mcg Oral QAC breakfast  . multivitamin with minerals  1 tablet Oral Daily  . mycophenolate  500 mg Oral BID   . pravastatin  20 mg Oral QHS  . sodium chloride  3 mL Intravenous Q12H  . sulfamethoxazole-trimethoprim  1 tablet Oral Q M,W,F  . valGANciclovir  450 mg Oral Q M,W,F   Continuous Infusions: . heparin 850 Units/hr (08/25/14 1736)   PRN Meds:.sodium chloride, acetaminophen **OR** acetaminophen, albuterol, guaiFENesin, hydrALAZINE, ondansetron **OR** ondansetron (ZOFRAN) IV, oxyCODONE, sodium chloride   Antibiotics   Anti-infectives    Start     Dose/Rate Route Frequency Ordered Stop   08/26/14 1000  sulfamethoxazole-trimethoprim (BACTRIM,SEPTRA) 400-80 MG per tablet 1 tablet     1 tablet Oral Every M-W-F 08/24/14 1713     08/26/14 1000  valGANciclovir (VALCYTE) 450 MG tablet TABS 450 mg     450 mg Oral Every M-W-F 08/24/14 1713          Subjective:   [name] was seen and examined today. Afebrile, no acute issues overnight. Patient denies dizziness, chest pain, shortness of breath, abdominal pain, N/V/D/C, new weakness, numbess, tingling.  Objective:   Blood pressure 129/58, pulse 76, temperature 98.4 F (36.9 C), temperature source Oral, resp. rate 17, height 5' (1.524 m), weight 62.143 kg (137 lb), SpO2 98 %.  Wt Readings from Last 3 Encounters:  08/26/14 62.143 kg (137 lb)  05/01/14 61.054 kg (134 lb 9.6 oz)  04/29/14 48.081 kg (106 lb)     Intake/Output Summary (Last 24 hours) at 08/26/14 1223 Last data filed at 08/26/14 0524  Gross per 24 hour  Intake  316.5 ml  Output      0 ml  Net  316.5 ml    Exam  General: Alert and oriented x 3, NAD  HEENT:  PERRLA, EOMI,   Neck: Supple, no JVD,   CVS: S1 S2 auscultated, 3/6 systolic murmur at the left sternal border   Respiratory: Clear to auscultation bilaterally  Abdomen: Soft, NT, NBS   Ext: no cyanosis clubbing,+ edema in the right lower extremity  Neuro: AAOx3, Cr N's II- XII. Strength 5/5 upper and lower extremities bilaterally  Skin: No rashes  Psych: Normal affect and demeanor, alert and  oriented x3    Data Review   Micro Results No results found for this or any previous visit (from the past 240 hour(s)).  Radiology Reports US Venous Img Lower Unilateral Right  08/24/2014   CLINICAL DATA:  Varicose veins strip from both legs. History of bilateral lung transplant. Right lower leg swelling for 1 week.  EXAM: Right LOWER EXTREMITY VENOUS DOPPLER ULTRASOUND  TECHNIQUE: Gray-scale sonography with graded compression, as well as color Doppler and duplex ultrasound were performed to evaluate the lower extremity deep venous systems from  the level of the common femoral vein and including the common femoral, femoral, profunda femoral, popliteal and calf veins including the posterior tibial, peroneal and gastrocnemius veins when visible. The superficial great saphenous vein was also interrogated. Spectral Doppler was utilized to evaluate flow at rest and with distal augmentation maneuvers in the common femoral, femoral and popliteal veins.  COMPARISON:  None.  FINDINGS: There is hypoechoic, expansile thrombus throughout the right deep venous system, extending from the calf veins veins through the femoral vein into the common femoral vein. The majority of the thrombosis is complete/occlusive. Mobile luminal clot present at the level of the common femoral vein. The contralateral common femoral vein has normal respiratory phasicity and no evidence of thrombus.  The patient returned to ordering physician's office to await results in a wheelchair. This study is marked for technologist call report.  IMPRESSION: Diffuse acute right lower extremity deep venous thrombosis with clot from the calf into the common femoral vein. The majority of the thrombosis is occlusive, consider IR clinic referral for thrombolysis evaluation.   Electronically Signed   By: Monte Fantasia M.D.   On: 08/24/2014 13:29    CBC  Recent Labs Lab 08/24/14 1819 08/25/14 0220 08/26/14 0349  WBC 8.8 9.1 7.2  HGB 8.8* 8.7* 8.3*   HCT 28.1* 27.3* 26.0*  PLT 191 190 192  MCV 101.8* 100.0 100.4*  MCH 31.9 31.9 32.0  MCHC 31.3 31.9 31.9  RDW 15.9* 15.9* 15.6*    Chemistries   Recent Labs Lab 08/24/14 1819 08/25/14 0220 08/26/14 0349  NA 137 136 138  K 4.2 4.1 4.2  CL 111 109 108  CO2 21* 20* 20*  GLUCOSE 125* 148* 188*  BUN 38* 38* 41*  CREATININE 2.95* 2.72* 2.88*  CALCIUM 8.0* 8.0* 8.1*  AST 22 21  --   ALT 19 18  --   ALKPHOS 35* 32*  --   BILITOT 0.9 0.9  --    ------------------------------------------------------------------------------------------------------------------ estimated creatinine clearance is 13.7 mL/min (by C-G formula based on Cr of 2.88). ------------------------------------------------------------------------------------------------------------------ No results for input(s): HGBA1C in the last 72 hours. ------------------------------------------------------------------------------------------------------------------ No results for input(s): CHOL, HDL, LDLCALC, TRIG, CHOLHDL, LDLDIRECT in the last 72 hours. ------------------------------------------------------------------------------------------------------------------ No results for input(s): TSH, T4TOTAL, T3FREE, THYROIDAB in the last 72 hours.  Invalid input(s): FREET3 ------------------------------------------------------------------------------------------------------------------ No results for input(s): VITAMINB12, FOLATE, FERRITIN, TIBC, IRON, RETICCTPCT in the last 72 hours.  Coagulation profile  Recent Labs Lab 08/24/14 1819  INR 1.17    No results for input(s): DDIMER in the last 72 hours.  Cardiac Enzymes No results for input(s): CKMB, TROPONINI, MYOGLOBIN in the last 168 hours.  Invalid input(s): CK ------------------------------------------------------------------------------------------------------------------ Invalid input(s): POCBNP  No results for input(s): GLUCAP in the last 72  hours.   RAI,RIPUDEEP M.D. [facility] 08/26/2014, 12:23 PM  Pager: [phone]   Between 7am to 7pm - call Pager - [phone]  After 7pm go to [email or web address] - password [email or web address]  Call night coverage person covering after 7pm

## 2014-08-27 ENCOUNTER — Encounter (HOSPITAL_COMMUNITY): Payer: Self-pay | Admitting: General Practice

## 2014-08-27 LAB — CBC
HCT: 25.2 % — ABNORMAL LOW (ref 36.0–46.0)
HEMOGLOBIN: 8.1 g/dL — AB (ref 12.0–15.0)
MCH: 32.3 pg (ref 26.0–34.0)
MCHC: 32.1 g/dL (ref 30.0–36.0)
MCV: 100.4 fL — ABNORMAL HIGH (ref 78.0–100.0)
Platelets: 197 10*3/uL (ref 150–400)
RBC: 2.51 MIL/uL — AB (ref 3.87–5.11)
RDW: 15.8 % — ABNORMAL HIGH (ref 11.5–15.5)
WBC: 8.5 10*3/uL (ref 4.0–10.5)

## 2014-08-27 LAB — BASIC METABOLIC PANEL
Anion gap: 8 (ref 5–15)
BUN: 47 mg/dL — ABNORMAL HIGH (ref 6–20)
CO2: 23 mmol/L (ref 22–32)
CREATININE: 3.1 mg/dL — AB (ref 0.44–1.00)
Calcium: 8.1 mg/dL — ABNORMAL LOW (ref 8.9–10.3)
Chloride: 108 mmol/L (ref 101–111)
GFR, EST AFRICAN AMERICAN: 16 mL/min — AB (ref >60)
GFR, EST NON AFRICAN AMERICAN: 14 mL/min — AB (ref >60)
Glucose, Bld: 147 mg/dL — ABNORMAL HIGH (ref 65–99)
Potassium: 3.9 mmol/L (ref 3.5–5.1)
Sodium: 139 mmol/L (ref 135–145)

## 2014-08-27 LAB — HEPARIN LEVEL (UNFRACTIONATED): Heparin Unfractionated: 0.43 IU/mL (ref 0.30–0.70)

## 2014-08-27 LAB — PROTIME-INR
INR: 1.13 (ref 0.00–1.49)
PROTHROMBIN TIME: 14.7 s (ref 11.6–15.2)

## 2014-08-27 MED ORDER — ENOXAPARIN SODIUM 60 MG/0.6ML ~~LOC~~ SOLN
1.0000 mg/kg | SUBCUTANEOUS | Status: DC
  Administered 2014-08-27 – 2014-08-28 (×2): 60 mg via SUBCUTANEOUS
  Filled 2014-08-27 (×2): qty 0.6

## 2014-08-27 MED ORDER — WARFARIN SODIUM 5 MG PO TABS
5.0000 mg | ORAL_TABLET | Freq: Every day | ORAL | Status: DC
  Administered 2014-08-27: 5 mg via ORAL
  Filled 2014-08-27: qty 1

## 2014-08-27 MED ORDER — SODIUM CHLORIDE 0.9 % IV SOLN
INTRAVENOUS | Status: DC
  Administered 2014-08-27: 12:00:00 via INTRAVENOUS

## 2014-08-27 NOTE — Clinical Social Work Note (Signed)
CSW received referral for SNF.  Case discussed with case manager and plan is to discharge home with home health, patient is from [facility] independent [facility].  CSW to sign off please re-consult if social work needs arise.  Jones Broom. [place], MSW, [place]

## 2014-08-27 NOTE — Progress Notes (Addendum)
ANTICOAGULATION CONSULT NOTE - Follow Up Consult  Pharmacy Consult for change Heparin to LMWH and coumadin Indication: DVT, right leg, extensive  Allergies  Allergen Reactions  . Grapefruit Extract Other (See Comments)    Cannot take with meds  . Oysters [Shellfish Allergy] Swelling  . Pineapple Swelling  . Bupropion Other (See Comments)    sleepy  . Neomycin-Bacitracin Zn-Polymyx Hives and Other (See Comments)    Blisters (pt has used small amounts without a reaction)  . Penicillins Swelling    Facial swelling  . Prednisone Other (See Comments)    crazy    Patient Measurements: Height: 5' (152.4 cm) Weight: 133 lb 4.8 oz (60.464 kg) IBW/kg (Calculated) : 45.5 Heparin Dosing Weight: 63.9 kg  Vital Signs: Temp: 97.7 F (36.5 C) (05/12 0618) Temp Source: Oral (05/12 0618) BP: 178/85 mmHg (05/12 0618) Pulse Rate: 82 (05/12 0618)  Labs:  Recent Labs  08/24/14 1819  08/25/14 0220 08/25/14 1141 08/26/14 0349 08/27/14 0338  HGB 8.8*  --  8.7*  --  8.3* 8.1*  HCT 28.1*  --  27.3*  --  26.0* 25.2*  PLT 191  --  190  --  192 197  APTT 40*  --   --   --   --   --   LABPROT 15.0  --   --   --   --  14.7  INR 1.17  --   --   --   --  1.13  HEPARINUNFRC  --   < > 0.92* 0.57 0.54 0.43  CREATININE 2.95*  --  2.72*  --  2.88* 3.10*  < > = values in this interval not displayed.  Estimated Creatinine Clearance: 12.6 mL/min (by C-G formula based on Cr of 3.1).  Assessment: 79 y.o female on heparin for extensive right leg DVT. IR will not perform intervention such as catheter directed thrombolysis/thrombectomy b/c isolated to calf region. Not a candidate for NOACs 2nd CKD stage IV. At independent living, to be upgraded to skilled for supervision for falls. Coumadin started 5/11.  Coumadin score = 2.  Alb 2.4.  She is on septra DS MWF for lung transplant which may increase INR.  Septra dc'd 5/12.  INR still baseline after first dose, as expected. HL therapeutic at 0.43 on heparin  at 850 units/hr today. Hg 8.1 - stable, PLTC 197 stable, creat 3.10, stable.  No bleeding reported.  To transition to [place] today.   Day # 2/5 of minimum overlap for VTE core measures.    Goal of Therapy: INR 2-3   Plan:  DC heparin drip at 11 am and start LMWH 60 mg sq q24 hours at 1200 nooon Coumadin 5 mg po daily Coumadin education completed with pt on 5/11 by MTB Day #2/5 of minimum overlap for VTE core measure Daily INR  Eudelia Bunch, Pharm.D. [phone] 08/27/2014 10:49 AM

## 2014-08-27 NOTE — Care Management Note (Signed)
Case Management Note  Patient Details  Name: Nancy Blair MRN: [id number] Date of Birth: Jun 16, 1935  Subjective/Objective:                    Action/Plan: Home with [place]   Expected Discharge Date:        08-28-14           Expected Discharge Plan:  [facility]  In-House Referral:     Discharge planning Services     Post Acute Care Choice:    Choice offered to:  Patient (Patient already active with [name] )  DME Arranged:    DME Agency:     HH Arranged:  PT HH Agency:  [facility]  Status of Service:     Medicare Important Message Given:  Yes Date Medicare IM Given:  08/27/14 Medicare IM give by:  [name] RN BSN  Date Additional Medicare IM Given:    Additional Medicare Important Message give by:     If discussed at [place] of Stay Meetings, dates discussed:    Additional Comments:  Marilu Favre, RN 08/27/2014, 10:33 AM

## 2014-08-27 NOTE — Progress Notes (Signed)
PT Cancellation Note  Patient Details Name: Nancy Blair MRN: [id number] DOB: 1936/04/09   Cancelled Treatment:    Reason Eval/Treat Not Completed: Patient declined, no reason specified Politely declines to work with therapy this afternoon. States she did not sleep well last night and needs to rest. Encouraged pt to remain mobile as able. Reports she plans to walk with her daughter who is coming to visit today. Will follow up as time allows.  Ellouise Newer 08/27/2014, 3:13 PM Camille Bal [place], [place]

## 2014-08-27 NOTE — Progress Notes (Signed)
[facility]                                                                              Patient Demographics  Nancy Blair, is a 79 y.o. female, DOB - 08-Apr-1936, CBU:[id number]  Admit date - 08/24/2014   Admitting Physician Nancy Haff, MD  Outpatient Primary MD for the patient is Nancy Asa, MD  LOS - 3   No chief complaint on file.      Brief HPI    Patient is a 79 y.o. female with hypertension, chronic kidney disease stage IV, lung transplant for COPD about 4 years ago done at [facility], who took her road trip to [place] about 8 days ago without taking breaks. After she came back to [facility] within 2-3 days she started developing swelling in her right leg which was more than normal. She actually went to the urgent care center and was recommended to go to the emergency department for a vascular study. However, patient preferred to go back home and see her primary care physician. She went to her PCPs office on the day of admission and was referred for a venous Doppler which was positive for DVT. Patient denied any chest pain or shortness of breath. No nausea or vomiting. No fever, chills. Patient's daughter reported that she fell 3 weeks ago and she stepped out of the car. Patient is currently in the independent living facility with home health nursing and home PT.    Assessment & Plan    Principal Problem:   Right leg DVT - Doppler ultrasound showed diffuse acute right lower extremity DVT with clot from the calf to the common femoral vein - Patient was seen by interventional radiology who recommended anticoagulation. Discussed with Nancy Blair, IR, her symptoms are isolated to the calf region that will not be treated by intervention such as catheter directed thrombolysis/thrombectomy, there is risks of intervention including bleeding and renal failure. IVC filter can be alternative to anticoagulation. - Continue Coumadin per pharmacy, start Lovenox  today with Lovenox teaching   Active Problems:   Hypothyroidism - Continue Synthroid  Lung transplant - Continue multiple immunosuppressants including steroids, cyclosporine, CellCept  Chronic diastolic CHF, aortic stenosis - Currently stable, euvolemic, follow closely  Essential hypertension  - Continue antihypertensives hydralazine, Imdur, Coreg  Chronic kidney stage IV  - Baseline creatinine appears to be between 2.5-3 , creatinine trending up to 3.1 today, monitor closely, gentle hydration for 1 L - Lasix and Bactrim on hold   Code Status: Full code  Family Communication: Discussed in detail with the patient, all imaging results, lab results explained to the patient   Disposition Plan: Likely tomorrow to assisted living facility  Time Spent in minutes  25 minutes  Procedures  Doppler ultrasound of the lower extremity  Consults   Interventional radiology  DVT Prophylaxis heparin drip and Coumadin  Medications  Scheduled Meds: . amLODipine  2.5 mg Oral Daily  . aspirin  81 mg Oral Daily  . carvedilol  6.25 mg Oral BID WC  . citalopram  40 mg Oral Daily  . cycloSPORINE modified  125 mg Oral BID  .  enoxaparin (LOVENOX) injection  1 mg/kg Subcutaneous Q24H  . fenofibrate  160 mg Oral Daily  . ferrous sulfate  325 mg Oral Q breakfast  . hydrocortisone  20 mg Oral BID  . isosorbide mononitrate  15 mg Oral Daily  . levothyroxine  100 mcg Oral QAC breakfast  . multivitamin with minerals  1 tablet Oral Daily  . mycophenolate  500 mg Oral BID  . pravastatin  20 mg Oral QHS  . sodium chloride  3 mL Intravenous Q12H  . valGANciclovir  450 mg Oral Q M,W,F  . warfarin  5 mg Oral q1800  . Warfarin - Pharmacist Dosing Inpatient   Does not apply q1800   Continuous Infusions: . sodium chloride 75 mL/hr at 08/27/14 1135   PRN Meds:.sodium chloride, acetaminophen **OR** acetaminophen, albuterol, guaiFENesin, hydrALAZINE, ondansetron **OR** ondansetron (ZOFRAN) IV,  oxyCODONE, sodium chloride   Antibiotics   Anti-infectives    Start     Dose/Rate Route Frequency Ordered Stop   08/26/14 1000  sulfamethoxazole-trimethoprim (BACTRIM,SEPTRA) 400-80 MG per tablet 1 tablet  Status:  Discontinued     1 tablet Oral Every M-W-F 08/24/14 1713 08/27/14 1000   08/26/14 1000  valGANciclovir (VALCYTE) 450 MG tablet TABS 450 mg     450 mg Oral Every M-W-F 08/24/14 1713          Subjective:   [name] was seen and examined today. No acute issues, no bleeding noticed on Coumadin and heparin drip. Afebrile, no acute issues overnight. Patient denies dizziness, chest pain, shortness of breath, abdominal pain, N/V/D/C, new weakness, numbess, tingling.  Objective:   Blood pressure 178/85, pulse 82, temperature 97.7 F (36.5 C), temperature source Oral, resp. rate 18, height 5' (1.524 m), weight 60.464 kg (133 lb 4.8 oz), SpO2 99 %.  Wt Readings from Last 3 Encounters:  08/27/14 60.464 kg (133 lb 4.8 oz)  05/01/14 61.054 kg (134 lb 9.6 oz)  04/29/14 48.081 kg (106 lb)     Intake/Output Summary (Last 24 hours) at 08/27/14 1339 Last data filed at 08/27/14 0542  Gross per 24 hour  Intake    445 ml  Output      0 ml  Net    445 ml    Exam  General: Alert and oriented x 3, NAD  HEENT:  PERRLA, EOMI,   Neck: Supple, no JVD,   CVS: S1 S2 auscultated, 3/6 systolic murmur at the left sternal border   Respiratory: CTAB  Abdomen: Soft, NT, NBS   Ext: no cyanosis, clubbing,+ edema in the right lower extremity  Neuro: AAOx3, Cr N's II- XII. Strength 5/5 UE, LE b/l  Skin: No rashes  Psych: Normal affect and demeanor, alert and oriented x3    Data Review   Micro Results No results found for this or any previous visit (from the past 240 hour(s)).  Radiology Reports US Venous Img Lower Unilateral Right  08/24/2014   CLINICAL DATA:  Varicose veins strip from both legs. History of bilateral lung transplant. Right lower leg swelling for 1 week.   EXAM: Right LOWER EXTREMITY VENOUS DOPPLER ULTRASOUND  TECHNIQUE: Gray-scale sonography with graded compression, as well as color Doppler and duplex ultrasound were performed to evaluate the lower extremity deep venous systems from the level of the common femoral vein and including the common femoral, femoral, profunda femoral, popliteal and calf veins including the posterior tibial, peroneal and gastrocnemius veins when visible. The superficial great saphenous vein was also interrogated. Spectral Doppler was utilized to evaluate  flow at rest and with distal augmentation maneuvers in the common femoral, femoral and popliteal veins.  COMPARISON:  None.  FINDINGS: There is hypoechoic, expansile thrombus throughout the right deep venous system, extending from the calf veins veins through the femoral vein into the common femoral vein. The majority of the thrombosis is complete/occlusive. Mobile luminal clot present at the level of the common femoral vein. The contralateral common femoral vein has normal respiratory phasicity and no evidence of thrombus.  The patient returned to ordering physician's office to await results in a wheelchair. This study is marked for technologist call report.  IMPRESSION: Diffuse acute right lower extremity deep venous thrombosis with clot from the calf into the common femoral vein. The majority of the thrombosis is occlusive, consider IR clinic referral for thrombolysis evaluation.   Electronically Signed   By: Monte Fantasia M.D.   On: 08/24/2014 13:29    CBC  Recent Labs Lab 08/24/14 1819 08/25/14 0220 08/26/14 0349 08/27/14 0338  WBC 8.8 9.1 7.2 8.5  HGB 8.8* 8.7* 8.3* 8.1*  HCT 28.1* 27.3* 26.0* 25.2*  PLT 191 190 192 197  MCV 101.8* 100.0 100.4* 100.4*  MCH 31.9 31.9 32.0 32.3  MCHC 31.3 31.9 31.9 32.1  RDW 15.9* 15.9* 15.6* 15.8*    Chemistries   Recent Labs Lab 08/24/14 1819 08/25/14 0220 08/26/14 0349 08/27/14 0338  NA 137 136 138 139  K 4.2 4.1 4.2  3.9  CL 111 109 108 108  CO2 21* 20* 20* 23  GLUCOSE 125* 148* 188* 147*  BUN 38* 38* 41* 47*  CREATININE 2.95* 2.72* 2.88* 3.10*  CALCIUM 8.0* 8.0* 8.1* 8.1*  AST 22 21  --   --   ALT 19 18  --   --   ALKPHOS 35* 32*  --   --   BILITOT 0.9 0.9  --   --    ------------------------------------------------------------------------------------------------------------------ estimated creatinine clearance is 12.6 mL/min (by C-G formula based on Cr of 3.1). ------------------------------------------------------------------------------------------------------------------ No results for input(s): HGBA1C in the last 72 hours. ------------------------------------------------------------------------------------------------------------------ No results for input(s): CHOL, HDL, LDLCALC, TRIG, CHOLHDL, LDLDIRECT in the last 72 hours. ------------------------------------------------------------------------------------------------------------------ No results for input(s): TSH, T4TOTAL, T3FREE, THYROIDAB in the last 72 hours.  Invalid input(s): FREET3 ------------------------------------------------------------------------------------------------------------------ No results for input(s): VITAMINB12, FOLATE, FERRITIN, TIBC, IRON, RETICCTPCT in the last 72 hours.  Coagulation profile  Recent Labs Lab 08/24/14 1819 08/27/14 0338  INR 1.17 1.13    No results for input(s): DDIMER in the last 72 hours.  Cardiac Enzymes No results for input(s): CKMB, TROPONINI, MYOGLOBIN in the last 168 hours.  Invalid input(s): CK ------------------------------------------------------------------------------------------------------------------ Invalid input(s): POCBNP  No results for input(s): GLUCAP in the last 72 hours.   Dezeray Puccio M.D. [facility] 08/27/2014, 1:39 PM  Pager: [phone]   Between 7am to 7pm - call Pager - [phone]  After 7pm go to [email or web address] - password [email or web address]  Call  night coverage person covering after 7pm

## 2014-08-28 ENCOUNTER — Telehealth: Payer: Self-pay | Admitting: *Deleted

## 2014-08-28 LAB — BASIC METABOLIC PANEL
Anion gap: 9 (ref 5–15)
BUN: 46 mg/dL — ABNORMAL HIGH (ref 6–20)
CO2: 20 mmol/L — ABNORMAL LOW (ref 22–32)
Calcium: 7.6 mg/dL — ABNORMAL LOW (ref 8.9–10.3)
Chloride: 107 mmol/L (ref 101–111)
Creatinine, Ser: 2.96 mg/dL — ABNORMAL HIGH (ref 0.44–1.00)
GFR calc Af Amer: 17 mL/min — ABNORMAL LOW (ref >60)
GFR calc non Af Amer: 14 mL/min — ABNORMAL LOW (ref >60)
Glucose, Bld: 131 mg/dL — ABNORMAL HIGH (ref 65–99)
Potassium: 3.3 mmol/L — ABNORMAL LOW (ref 3.5–5.1)
Sodium: 136 mmol/L (ref 135–145)

## 2014-08-28 LAB — PROTIME-INR
INR: 1.97 — AB (ref 0.00–1.49)
Prothrombin Time: 22.6 seconds — ABNORMAL HIGH (ref 11.6–15.2)

## 2014-08-28 MED ORDER — WARFARIN SODIUM 2 MG PO TABS: 2.0000 mg | ORAL_TABLET | Freq: Every day | ORAL | Status: DC

## 2014-08-28 MED ORDER — WARFARIN 0.5 MG HALF TABLET
0.5000 mg | ORAL_TABLET | Freq: Once | ORAL | Status: AC
  Administered 2014-08-28: 0.5 mg via ORAL
  Filled 2014-08-28: qty 1

## 2014-08-28 MED ORDER — ISOSORBIDE MONONITRATE ER 30 MG PO TB24: 30.0000 mg | ORAL_TABLET | Freq: Every day | ORAL | Status: AC

## 2014-08-28 MED ORDER — ISOSORBIDE MONONITRATE ER 30 MG PO TB24: 30.0000 mg | ORAL_TABLET | Freq: Every day | ORAL | Status: DC

## 2014-08-28 MED ORDER — ENOXAPARIN SODIUM 60 MG/0.6ML ~~LOC~~ SOLN: 60.0000 mg | SUBCUTANEOUS | Status: DC

## 2014-08-28 NOTE — Discharge Summary (Signed)
Physician Discharge Summary   Patient ID: Nancy Blair MRN: [id number] DOB/AGE: 04/26/1935 79 y.o.  Admit date: 08/24/2014 Discharge date: 08/28/2014  Primary Care Physician:  Annye Asa, MD  Discharge Diagnoses:    . Right leg DVT . Hypothyroidism . ANXIETY DEPRESSION . Essential hypertension . Chronic diastolic heart failure . CKD (chronic kidney disease) stage 4, GFR 15-29 ml/min   Consults:  None  Recommendations for Outpatient Follow-up:  Patient is being discharged on Lovenox 60 mg every 24 hours until INR is above 2 and Coumadin 2 mg daily Coumadin Dose may be changed according to PT/INR results Please check PT/INR on 5/14 and on Monday 5/16, results to be faxed to PCP  Home health PT, OT, RN is being arranged by case management   TESTS THAT NEED FOLLOW-UP PT/INR 5/14, 5/16   DIET:     Allergies:   Allergies  Allergen Reactions  . Grapefruit Extract Other (See Comments)    Cannot take with meds  . Oysters [Shellfish Allergy] Swelling  . Pineapple Swelling  . Bupropion Other (See Comments)    sleepy  . Neomycin-Bacitracin Zn-Polymyx Hives and Other (See Comments)    Blisters (pt has used small amounts without a reaction)  . Penicillins Swelling    Facial swelling  . Prednisone Other (See Comments)    crazy     Discharge Medications:   Medication List    STOP taking these medications        cephALEXin 250 MG capsule  Commonly known as:  KEFLEX     Potassium Chloride ER 20 MEQ Tbcr     sulfamethoxazole-trimethoprim 400-80 MG per tablet  Commonly known as:  BACTRIM,SEPTRA      TAKE these medications        amLODipine 2.5 MG tablet  Commonly known as:  NORVASC  Take 1 tablet (2.5 mg total) by mouth daily.     aspirin 81 MG chewable tablet  Chew 81 mg by mouth daily.     carvedilol 6.25 MG tablet  Commonly known as:  COREG  Take 1 tablet (6.25 mg total) by mouth 2 (two) times daily with a meal.     citalopram 40 MG tablet   Commonly known as:  CELEXA  TAKE 1 TABLET BY MOUTH DAILY     cycloSPORINE modified 25 MG capsule  Commonly known as:  NEORAL  Take 125 mg by mouth 2 (two) times daily.     diphenoxylate-atropine 2.5-0.025 MG per tablet  Commonly known as:  LOMOTIL  Take 1-2 tablets by mouth See admin instructions. Take 2 tablets daily at lunch (2pm), may take 1 more tablet later in the day as needed for diarrhea     enoxaparin 60 MG/0.6ML injection  Commonly known as:  LOVENOX  Inject 0.6 mLs (60 mg total) into the skin daily. Stop when INR is above 2  Start taking on:  08/29/2014     fenofibrate 160 MG tablet  TAKE ONE TABLET BY MOUTH EVERY DAY     ferrous sulfate 325 (65 FE) MG tablet  Take 325 mg by mouth daily.     furosemide 20 MG tablet  Commonly known as:  LASIX  Take 1 tablet (20 mg total) by mouth every other day. Takes on Monday, Wednesday, and Friday     hydrocortisone 20 MG tablet  Commonly known as:  CORTEF  Take 20 mg by mouth 2 (two) times daily.     isosorbide mononitrate 30 MG 24 hr tablet  Commonly  known as:  IMDUR  Take 1 tablet (30 mg total) by mouth daily.     levothyroxine 100 MCG tablet  Commonly known as:  SYNTHROID, LEVOTHROID  Take 1 tablet (100 mcg total) by mouth daily.     multivitamin with minerals Tabs tablet  Take 1 tablet by mouth daily.     mycophenolate 500 MG tablet  Commonly known as:  CELLCEPT  Take 500 mg by mouth 2 (two) times daily.     potassium chloride SA 20 MEQ tablet  Commonly known as:  K-DUR,KLOR-CON  Take 1 tablet (20 mEq total) by mouth daily.     pravastatin 20 MG tablet  Commonly known as:  PRAVACHOL  TAKE 1 TABLET BY MOUTH EVERY NIGHT AT BEDTIME     valGANciclovir 450 MG tablet  Commonly known as:  VALCYTE  Take 450 mg by mouth every Monday, Wednesday, and Friday.     warfarin 2 MG tablet  Commonly known as:  COUMADIN  Take 1 tablet (2 mg total) by mouth daily at 6 PM.  Start taking on:  08/29/2014         Brief H  and P: For complete details please refer to admission H and P, but in brief Patient is a 79 y.o. female with hypertension, chronic kidney disease stage IV, lung transplant for COPD about 4 years ago done at [facility], who took her road trip to [place] about 8 days ago without taking breaks. After she came back to [facility] [address] within 2-3 days she started developing swelling in her right leg which was more than normal. She actually went to the urgent care center and was recommended to go to the emergency department for a vascular study. However, patient preferred to go back home and see her primary care physician. She went to her PCPs office on the day of admission and was referred for a venous Doppler which was positive for DVT. Patient denied any chest pain or shortness of breath. No nausea or vomiting. No fever, chills. Patient's daughter reported that she fell 3 weeks ago and she stepped out of the car. Patient is currently in the independent living facility with home health nursing and home PT.  Hospital Course:   Right leg DVT - Doppler ultrasound showed diffuse acute right lower extremity DVT with clot from the calf to the common femoral vein - Patient was seen by interventional radiology who recommended anticoagulation. Discussed with Dr. Earleen Newport, IR, her symptoms are isolated to the calf region that will not be treated by intervention such as catheter directed thrombolysis/thrombectomy, there is risks of intervention including bleeding and renal failure. IVC filter can be alternative to anticoagulation. Discussed in detail with patient's daughter, Altha Harm.  I had explained various anticoagulation options.  Due to her CKD stage IV, she is not a candidate for NOAC's. Patient's daughter Altha Harm is okay with starting Coumadin at this time, initially patient was placed on heparin drip, subsequently transitioned to Lovenox.  Patient is on day #3/5 of Lovenox and Coumadin today, INR had a large jump 1.9  today. Patient will be given Coumadin 0.5 mg only today. She will continue Lovenox and Coumadin, recheck INR tomorrow 5/14 and subsequently 5/16, results to be faxed to PCP by home health at the independent living facility. Home health PT, OT, home health RN but arranged   Hypothyroidism - Continue Synthroid  Lung transplant - Continue multiple immunosuppressants including steroids, cyclosporine, CellCept  Chronic diastolic CHF, aortic stenosis - Currently stable, euvolemic, follow  closely  Essential hypertension  - Continue antihypertensives hydralazine, Imdur, Coreg  Chronic kidney stage IV  -Mild jump to 3.1, patient was given gentle hydration, Lasix and Bactrim were placed on hold. Creatinine is back to baseline now.   Day of Discharge BP 198/82 mmHg  Pulse 80  Temp(Src) 98.2 F (36.8 C) (Oral)  Resp 16  Ht 5' (1.524 m)  Wt 62.551 kg (137 lb 14.4 oz)  BMI 26.93 kg/m2  SpO2 98%  Physical Exam: General: Alert and awake oriented x3 not in any acute distress. HEENT: anicteric sclera, pupils reactive to light and accommodation CVS: S1-S2 clear, 3/6 systolic murmur at the left sternal border Chest: clear to auscultation bilaterally, no wheezing rales or rhonchi Abdomen: soft nontender, nondistended, normal bowel sounds Extremities: no cyanosis, clubbing, + edema noted right lower extremity Neuro: Cranial nerves II-XII intact, no focal neurological deficits   The results of significant diagnostics from this hospitalization (including imaging, microbiology, ancillary and laboratory) are listed below for reference.    LAB RESULTS: Basic Metabolic Panel:  Recent Labs Lab 08/27/14 0338 08/28/14 0253  NA 139 136  K 3.9 3.3*  CL 108 107  CO2 23 20*  GLUCOSE 147* 131*  BUN 47* 46*  CREATININE 3.10* 2.96*  CALCIUM 8.1* 7.6*   Liver Function Tests:  Recent Labs Lab 08/24/14 1819 08/25/14 0220  AST 22 21  ALT 19 18  ALKPHOS 35* 32*  BILITOT 0.9 0.9  PROT 5.3*  4.7*  ALBUMIN 2.4* 2.4*   No results for input(s): LIPASE, AMYLASE in the last 168 hours. No results for input(s): AMMONIA in the last 168 hours. CBC:  Recent Labs Lab 08/26/14 0349 08/27/14 0338  WBC 7.2 8.5  HGB 8.3* 8.1*  HCT 26.0* 25.2*  MCV 100.4* 100.4*  PLT 192 197   Cardiac Enzymes: No results for input(s): CKTOTAL, CKMB, CKMBINDEX, TROPONINI in the last 168 hours. BNP: Invalid input(s): POCBNP CBG: No results for input(s): GLUCAP in the last 168 hours.  Significant Diagnostic Studies:  US Venous Img Lower Unilateral Right  08/24/2014   CLINICAL DATA:  Varicose veins strip from both legs. History of bilateral lung transplant. Right lower leg swelling for 1 week.  EXAM: Right LOWER EXTREMITY VENOUS DOPPLER ULTRASOUND  TECHNIQUE: Gray-scale sonography with graded compression, as well as color Doppler and duplex ultrasound were performed to evaluate the lower extremity deep venous systems from the level of the common femoral vein and including the common femoral, femoral, profunda femoral, popliteal and calf veins including the posterior tibial, peroneal and gastrocnemius veins when visible. The superficial great saphenous vein was also interrogated. Spectral Doppler was utilized to evaluate flow at rest and with distal augmentation maneuvers in the common femoral, femoral and popliteal veins.  COMPARISON:  None.  FINDINGS: There is hypoechoic, expansile thrombus throughout the right deep venous system, extending from the calf veins veins through the femoral vein into the common femoral vein. The majority of the thrombosis is complete/occlusive. Mobile luminal clot present at the level of the common femoral vein. The contralateral common femoral vein has normal respiratory phasicity and no evidence of thrombus.  The patient returned to ordering physician's office to await results in a wheelchair. This study is marked for technologist call report.  IMPRESSION: Diffuse acute right lower  extremity deep venous thrombosis with clot from the calf into the common femoral vein. The majority of the thrombosis is occlusive, consider IR clinic referral for thrombolysis evaluation.   Electronically Signed   By: Angelica Chessman  Watts M.D.   On: 08/24/2014 13:29    2D ECHO:   Disposition and Follow-up:    DISPOSITION: Independent living facility   DISCHARGE FOLLOW-UP Follow-up Information    Follow up with [facility]   Why:  home health PT and RN   Contact information:   [address] [place] [address] [phone]       Follow up with Annye Asa, MD. Call on 08/31/2014.   Specialty:  Family Medicine   Why:  for hospital follow-up, please fax INR results to PCP   Contact information:   [address] [place] [address] [phone]        Time spent on Discharge: 25 minutes  Signed:   RAI,RIPUDEEP M.D. [facility] 08/28/2014, 2:27 PM Pager: [phone]

## 2014-08-28 NOTE — Care Management Note (Signed)
Case Management Note  Patient Details  Name: Nancy Blair MRN: [id number] Date of Birth: 1936/01/27  Subjective/Objective:                    Action/Plan: Spoke with daughter Nancy Blair [phone] who lives in [place] . Per [name] her sister who lives in [place] is unable to give Lovenox injections .   [name] with [name] , will have [name] visit patient for injections Saturday, Sunday and Monday and PT/ INR checks Saturday and Monday .   [name] aware. Confirmed PCP with [name].   Expected Discharge Date:       08-28-14           Expected Discharge Plan:  [name]  In-House Referral:     Discharge planning Services     Post Acute Care Choice:    Choice offered to:  Patient (Patient already active with [name] )  DME Arranged:    DME Agency:     HH Arranged:  PT, RN HH Agency:  [name]  Status of Service:     Medicare Important Message Given:  Yes Date Medicare IM Given:  08/27/14 Medicare IM give by:  [name] RN BSN  Date Additional Medicare IM Given:    Additional Medicare Important Message give by:     If discussed at [place] of Stay Meetings, dates discussed:    Additional Comments:  Marilu Favre, RN 08/28/2014, 2:28 PM

## 2014-08-28 NOTE — Progress Notes (Signed)
Discharge home. Home discharge instruction given, no question verbalized. HHN addressed by [facility].

## 2014-08-28 NOTE — Telephone Encounter (Signed)
Per message from Dr. [name]:   Message    Pt needs TCC and f/u. Please have home health call PT/INR results to after hours [facility] and not fax b/c we won't get the fax until Monday      Thanks!    [place] and spoke with Zigmund Daniel- Midwife.  She states she is will be the Midwife for the weekend and will relay the message to please call in the results to team [facility] (by calling office and choosing speak to nurse) option.

## 2014-08-28 NOTE — Progress Notes (Signed)
ANTICOAGULATION CONSULT NOTE - Follow Up Consult  Pharmacy Consult for Lovenox + Warfarin  Indication: Extensive RLE DVT  Allergies  Allergen Reactions  . Grapefruit Extract Other (See Comments)    Cannot take with meds  . Oysters [Shellfish Allergy] Swelling  . Pineapple Swelling  . Bupropion Other (See Comments)    sleepy  . Neomycin-Bacitracin Zn-Polymyx Hives and Other (See Comments)    Blisters (pt has used small amounts without a reaction)  . Penicillins Swelling    Facial swelling  . Prednisone Other (See Comments)    crazy    Patient Measurements: Height: 5' (152.4 cm) Weight: 137 lb 14.4 oz (62.551 kg) IBW/kg (Calculated) : 45.5 Heparin Dosing Weight: 63.9 kg  Vital Signs: Temp: 98.2 F (36.8 C) (05/13 0509) Temp Source: Oral (05/13 0509) BP: 198/82 mmHg (05/13 0509) Pulse Rate: 80 (05/13 0509)  Labs:  Recent Labs  08/26/14 0349 08/27/14 0338 08/28/14 0253  HGB 8.3* 8.1*  --   HCT 26.0* 25.2*  --   PLT 192 197  --   LABPROT  --  14.7 22.6*  INR  --  1.13 1.97*  HEPARINUNFRC 0.54 0.43  --   CREATININE 2.88* 3.10* 2.96*    Estimated Creatinine Clearance: 13.3 mL/min (by C-G formula based on Cr of 2.96).  Assessment: 81 YOF transitioned to lovenox on 5/12 with warfarin for continued treatment of extensive RLE DVT. Today is VTE overlap D#3/5 as recommended per CHEST guidelines. INR today is SUBtherapeutic but had a large jump up (INR 1.97 << 1.13, goal of 2-3). Will reduce the dose significantly today. Lovenox dose remains appropriate. The patient was educated on warfarin this admission.   Day # 3/5 of minimum overlap for VTE core measures.    Goal of Therapy: INR 2-3   Plan:  1. Continue Lovenox 60 mg SQ every 24 hours 2. Warfarin 0.5 mg x 1 dose at 1800 today 3. Will continue to monitor for any signs/symptoms of bleeding and will follow up with PT/INR in the a.m.   Alycia Rossetti, PharmD, BCPS Clinical Pharmacist Pager: [phone] 08/28/2014  1:27 PM

## 2014-08-28 NOTE — Progress Notes (Signed)
Physical Therapy Treatment Patient Details Name: Nancy Blair MRN: [id number] DOB: [birth year] Today's Date: 08/28/2014    History of Present Illness Nancy Blair is a 79 y.o. female with a past medical history of hypertension, chronic kidney disease stage IV, lung transplant for COPD about 4 years ago done at [facility]. She was admitted with an extensive RLE deep venous thrombosis.    PT Comments    Continues to progress well towards physical therapy goals. Ambulates 225 feet today. Minor instability while ambulating, easily fatigued but did not require physical assist during bout. SpO2 97% on room air, HR in 80s, 3/4 dyspnea resolved after sitting. Adequate for d/c from a mobility standpoint when medically ready. Patient will continue to benefit from skilled physical therapy services at home with HHPT to further improve independence with functional mobility.   Follow Up Recommendations  Home health PT     Equipment Recommendations  Cane    Recommendations for Other Services       Precautions / Restrictions Precautions Precautions: Fall Restrictions Weight Bearing Restrictions: No    Mobility  Bed Mobility Overal bed mobility: Modified Independent                Transfers Overall transfer level: Modified independent                  Ambulation/Gait Ambulation/Gait assistance: Supervision Ambulation Distance (Feet): 225 Feet Assistive device: None Gait Pattern/deviations: Step-through pattern;Decreased stride length;Drifts right/left Gait velocity: decreased   General Gait Details: Minor sway noted at times but did not require physical assist. reaches for rail in hall intermittently as she fatigues. Required 2 standing rest breaks to complete distance. 3/4 dyspnea with SpO2 of 97% on room air and HR in 80s. Dyspnea improves with rest and cues for pursed lip breathing.   Stairs            Wheelchair Mobility    Modified Rankin (Stroke Patients  Only)       Balance                                    Cognition Arousal/Alertness: Awake/alert Behavior During Therapy: WFL for tasks assessed/performed Overall Cognitive Status: Within Functional Limits for tasks assessed                      Exercises General Exercises - Lower Extremity Ankle Circles/Pumps: AROM;Both;10 reps;Seated Long Arc Quad: AROM;Both;10 reps;Seated;Strengthening    General Comments General comments (skin integrity, edema, etc.): reinforced importance of mobility      Pertinent Vitals/Pain Pain Assessment: No/denies pain    Home Living                      Prior Function            PT Goals (current goals can now be found in the care plan section) Acute Rehab PT Goals Patient Stated Goal: go home PT Goal Formulation: With patient Time For Goal Achievement: 09/09/14 Potential to Achieve Goals: Good Progress towards PT goals: Progressing toward goals    Frequency  Min 3X/week    PT Plan Current plan remains appropriate    Co-evaluation             End of Session   Activity Tolerance: Patient tolerated treatment well;No increased pain Patient left: with call bell/phone within reach;in chair     Time:  5258-9483 PT Time Calculation (min) (ACUTE ONLY): 15 min  Charges:  $Gait Training: 8-22 mins                    G Codes:      Nancy Blair 09/02/2014, 12:41 PM Nancy Blair [place], [place]

## 2014-08-29 DIAGNOSIS — K219 Gastro-esophageal reflux disease without esophagitis: Secondary | ICD-10-CM | POA: Diagnosis not present

## 2014-08-29 DIAGNOSIS — I5042 Chronic combined systolic (congestive) and diastolic (congestive) heart failure: Secondary | ICD-10-CM | POA: Diagnosis not present

## 2014-08-29 DIAGNOSIS — J439 Emphysema, unspecified: Secondary | ICD-10-CM | POA: Diagnosis not present

## 2014-08-29 DIAGNOSIS — N184 Chronic kidney disease, stage 4 (severe): Secondary | ICD-10-CM | POA: Diagnosis not present

## 2014-08-29 DIAGNOSIS — I129 Hypertensive chronic kidney disease with stage 1 through stage 4 chronic kidney disease, or unspecified chronic kidney disease: Secondary | ICD-10-CM | POA: Diagnosis not present

## 2014-08-29 DIAGNOSIS — E039 Hypothyroidism, unspecified: Secondary | ICD-10-CM | POA: Diagnosis not present

## 2014-08-29 DIAGNOSIS — Z942 Lung transplant status: Secondary | ICD-10-CM | POA: Diagnosis not present

## 2014-08-30 DIAGNOSIS — Z942 Lung transplant status: Secondary | ICD-10-CM | POA: Diagnosis not present

## 2014-08-30 DIAGNOSIS — I5042 Chronic combined systolic (congestive) and diastolic (congestive) heart failure: Secondary | ICD-10-CM | POA: Diagnosis not present

## 2014-08-30 DIAGNOSIS — N184 Chronic kidney disease, stage 4 (severe): Secondary | ICD-10-CM | POA: Diagnosis not present

## 2014-08-30 DIAGNOSIS — E039 Hypothyroidism, unspecified: Secondary | ICD-10-CM | POA: Diagnosis not present

## 2014-08-30 DIAGNOSIS — J439 Emphysema, unspecified: Secondary | ICD-10-CM | POA: Diagnosis not present

## 2014-08-30 DIAGNOSIS — I129 Hypertensive chronic kidney disease with stage 1 through stage 4 chronic kidney disease, or unspecified chronic kidney disease: Secondary | ICD-10-CM | POA: Diagnosis not present

## 2014-08-30 DIAGNOSIS — K219 Gastro-esophageal reflux disease without esophagitis: Secondary | ICD-10-CM | POA: Diagnosis not present

## 2014-08-31 ENCOUNTER — Telehealth: Payer: Self-pay | Admitting: Family Medicine

## 2014-08-31 DIAGNOSIS — K219 Gastro-esophageal reflux disease without esophagitis: Secondary | ICD-10-CM | POA: Diagnosis not present

## 2014-08-31 DIAGNOSIS — Z942 Lung transplant status: Secondary | ICD-10-CM | POA: Diagnosis not present

## 2014-08-31 DIAGNOSIS — N184 Chronic kidney disease, stage 4 (severe): Secondary | ICD-10-CM | POA: Diagnosis not present

## 2014-08-31 DIAGNOSIS — I129 Hypertensive chronic kidney disease with stage 1 through stage 4 chronic kidney disease, or unspecified chronic kidney disease: Secondary | ICD-10-CM | POA: Diagnosis not present

## 2014-08-31 DIAGNOSIS — J439 Emphysema, unspecified: Secondary | ICD-10-CM | POA: Diagnosis not present

## 2014-08-31 DIAGNOSIS — I5042 Chronic combined systolic (congestive) and diastolic (congestive) heart failure: Secondary | ICD-10-CM | POA: Diagnosis not present

## 2014-08-31 DIAGNOSIS — E039 Hypothyroidism, unspecified: Secondary | ICD-10-CM | POA: Diagnosis not present

## 2014-08-31 NOTE — Telephone Encounter (Signed)
Caller name: Jackelyn Poling at [facility] Relation to pt: Call back number: [place]:  Reason for call:   Calling in with INR results. 2.4 and PT is 28.8

## 2014-08-31 NOTE — Telephone Encounter (Signed)
Spoke with Provider and advised Nancy Blair that results were perfect and pt is to continue current treatment.

## 2014-09-01 ENCOUNTER — Telehealth: Payer: Self-pay | Admitting: Family Medicine

## 2014-09-01 NOTE — Telephone Encounter (Signed)
Caller: Bess Kinds w/[facility] Ph#: [phone]  Calling to get order for resumption of care for home health. Please call her with verbal order.

## 2014-09-02 DIAGNOSIS — K219 Gastro-esophageal reflux disease without esophagitis: Secondary | ICD-10-CM | POA: Diagnosis not present

## 2014-09-02 DIAGNOSIS — N184 Chronic kidney disease, stage 4 (severe): Secondary | ICD-10-CM | POA: Diagnosis not present

## 2014-09-02 DIAGNOSIS — J439 Emphysema, unspecified: Secondary | ICD-10-CM | POA: Diagnosis not present

## 2014-09-02 DIAGNOSIS — I129 Hypertensive chronic kidney disease with stage 1 through stage 4 chronic kidney disease, or unspecified chronic kidney disease: Secondary | ICD-10-CM | POA: Diagnosis not present

## 2014-09-02 DIAGNOSIS — E039 Hypothyroidism, unspecified: Secondary | ICD-10-CM | POA: Diagnosis not present

## 2014-09-02 DIAGNOSIS — Z942 Lung transplant status: Secondary | ICD-10-CM | POA: Diagnosis not present

## 2014-09-02 DIAGNOSIS — I5042 Chronic combined systolic (congestive) and diastolic (congestive) heart failure: Secondary | ICD-10-CM | POA: Diagnosis not present

## 2014-09-02 NOTE — Telephone Encounter (Signed)
Ok given to restart home health, Advised that Dr. [name] would like for INR to be redrawn on Thursday. Jackelyn Poling stated that would be fine and will call with the response.

## 2014-09-04 ENCOUNTER — Encounter (HOSPITAL_COMMUNITY): Payer: Self-pay | Admitting: *Deleted

## 2014-09-04 ENCOUNTER — Inpatient Hospital Stay (HOSPITAL_COMMUNITY)
Admission: EM | Admit: 2014-09-04 | Discharge: 2014-09-09 | DRG: 357 | Disposition: A | Discharge status: Home Health | Payer: Medicare Other | Attending: Internal Medicine | Admitting: Internal Medicine

## 2014-09-04 DIAGNOSIS — Z79899 Other long term (current) drug therapy: Secondary | ICD-10-CM | POA: Diagnosis not present

## 2014-09-04 DIAGNOSIS — T45515A Adverse effect of anticoagulants, initial encounter: Secondary | ICD-10-CM | POA: Diagnosis present

## 2014-09-04 DIAGNOSIS — A0472 Enterocolitis due to Clostridium difficile, not specified as recurrent: Secondary | ICD-10-CM | POA: Insufficient documentation

## 2014-09-04 DIAGNOSIS — Z91013 Allergy to seafood: Secondary | ICD-10-CM

## 2014-09-04 DIAGNOSIS — D500 Iron deficiency anemia secondary to blood loss (chronic): Secondary | ICD-10-CM | POA: Diagnosis not present

## 2014-09-04 DIAGNOSIS — D631 Anemia in chronic kidney disease: Secondary | ICD-10-CM | POA: Diagnosis not present

## 2014-09-04 DIAGNOSIS — K2211 Ulcer of esophagus with bleeding: Principal | ICD-10-CM | POA: Diagnosis present

## 2014-09-04 DIAGNOSIS — I129 Hypertensive chronic kidney disease with stage 1 through stage 4 chronic kidney disease, or unspecified chronic kidney disease: Secondary | ICD-10-CM | POA: Diagnosis not present

## 2014-09-04 DIAGNOSIS — Z888 Allergy status to other drugs, medicaments and biological substances status: Secondary | ICD-10-CM

## 2014-09-04 DIAGNOSIS — K922 Gastrointestinal hemorrhage, unspecified: Secondary | ICD-10-CM | POA: Diagnosis not present

## 2014-09-04 DIAGNOSIS — K210 Gastro-esophageal reflux disease with esophagitis: Secondary | ICD-10-CM | POA: Diagnosis present

## 2014-09-04 DIAGNOSIS — E038 Other specified hypothyroidism: Secondary | ICD-10-CM

## 2014-09-04 DIAGNOSIS — K921 Melena: Secondary | ICD-10-CM | POA: Insufficient documentation

## 2014-09-04 DIAGNOSIS — Z880 Allergy status to penicillin: Secondary | ICD-10-CM

## 2014-09-04 DIAGNOSIS — Z87891 Personal history of nicotine dependence: Secondary | ICD-10-CM | POA: Diagnosis not present

## 2014-09-04 DIAGNOSIS — Z8249 Family history of ischemic heart disease and other diseases of the circulatory system: Secondary | ICD-10-CM

## 2014-09-04 DIAGNOSIS — D62 Acute posthemorrhagic anemia: Secondary | ICD-10-CM | POA: Diagnosis not present

## 2014-09-04 DIAGNOSIS — D649 Anemia, unspecified: Secondary | ICD-10-CM

## 2014-09-04 DIAGNOSIS — E785 Hyperlipidemia, unspecified: Secondary | ICD-10-CM | POA: Diagnosis not present

## 2014-09-04 DIAGNOSIS — D132 Benign neoplasm of duodenum: Secondary | ICD-10-CM | POA: Diagnosis not present

## 2014-09-04 DIAGNOSIS — K319 Disease of stomach and duodenum, unspecified: Secondary | ICD-10-CM | POA: Diagnosis not present

## 2014-09-04 DIAGNOSIS — E2740 Unspecified adrenocortical insufficiency: Secondary | ICD-10-CM | POA: Diagnosis not present

## 2014-09-04 DIAGNOSIS — Z800 Family history of malignant neoplasm of digestive organs: Secondary | ICD-10-CM

## 2014-09-04 DIAGNOSIS — Z91018 Allergy to other foods: Secondary | ICD-10-CM | POA: Diagnosis not present

## 2014-09-04 DIAGNOSIS — K222 Esophageal obstruction: Secondary | ICD-10-CM | POA: Diagnosis present

## 2014-09-04 DIAGNOSIS — E119 Type 2 diabetes mellitus without complications: Secondary | ICD-10-CM | POA: Diagnosis present

## 2014-09-04 DIAGNOSIS — N184 Chronic kidney disease, stage 4 (severe): Secondary | ICD-10-CM | POA: Diagnosis present

## 2014-09-04 DIAGNOSIS — Z942 Lung transplant status: Secondary | ICD-10-CM | POA: Diagnosis not present

## 2014-09-04 DIAGNOSIS — Z7982 Long term (current) use of aspirin: Secondary | ICD-10-CM

## 2014-09-04 DIAGNOSIS — D689 Coagulation defect, unspecified: Secondary | ICD-10-CM | POA: Diagnosis not present

## 2014-09-04 DIAGNOSIS — R195 Other fecal abnormalities: Secondary | ICD-10-CM | POA: Diagnosis not present

## 2014-09-04 DIAGNOSIS — R1013 Epigastric pain: Secondary | ICD-10-CM | POA: Diagnosis not present

## 2014-09-04 DIAGNOSIS — A047 Enterocolitis due to Clostridium difficile: Secondary | ICD-10-CM | POA: Diagnosis present

## 2014-09-04 DIAGNOSIS — Z7901 Long term (current) use of anticoagulants: Secondary | ICD-10-CM | POA: Diagnosis not present

## 2014-09-04 DIAGNOSIS — E039 Hypothyroidism, unspecified: Secondary | ICD-10-CM | POA: Diagnosis present

## 2014-09-04 DIAGNOSIS — Z85118 Personal history of other malignant neoplasm of bronchus and lung: Secondary | ICD-10-CM

## 2014-09-04 DIAGNOSIS — R030 Elevated blood-pressure reading, without diagnosis of hypertension: Secondary | ICD-10-CM | POA: Diagnosis not present

## 2014-09-04 DIAGNOSIS — K449 Diaphragmatic hernia without obstruction or gangrene: Secondary | ICD-10-CM | POA: Diagnosis not present

## 2014-09-04 DIAGNOSIS — I5032 Chronic diastolic (congestive) heart failure: Secondary | ICD-10-CM | POA: Diagnosis not present

## 2014-09-04 DIAGNOSIS — C44721 Squamous cell carcinoma of skin of unspecified lower limb, including hip: Secondary | ICD-10-CM | POA: Diagnosis present

## 2014-09-04 DIAGNOSIS — Z66 Do not resuscitate: Secondary | ICD-10-CM | POA: Diagnosis present

## 2014-09-04 DIAGNOSIS — N179 Acute kidney failure, unspecified: Secondary | ICD-10-CM | POA: Diagnosis not present

## 2014-09-04 DIAGNOSIS — Z85828 Personal history of other malignant neoplasm of skin: Secondary | ICD-10-CM

## 2014-09-04 DIAGNOSIS — I82401 Acute embolism and thrombosis of unspecified deep veins of right lower extremity: Secondary | ICD-10-CM | POA: Diagnosis not present

## 2014-09-04 LAB — PREPARE RBC (CROSSMATCH)

## 2014-09-04 LAB — CBC WITH DIFFERENTIAL/PLATELET
BASOS PCT: 0 % (ref 0–1)
Basophils Absolute: 0 10*3/uL (ref 0.0–0.1)
EOS PCT: 0 % (ref 0–5)
Eosinophils Absolute: 0 10*3/uL (ref 0.0–0.7)
HCT: 19.3 % — ABNORMAL LOW (ref 36.0–46.0)
Hemoglobin: 6.1 g/dL — CL (ref 12.0–15.0)
Lymphocytes Relative: 7 % — ABNORMAL LOW (ref 12–46)
Lymphs Abs: 1.3 10*3/uL (ref 0.7–4.0)
MCH: 32.6 pg (ref 26.0–34.0)
MCHC: 31.6 g/dL (ref 30.0–36.0)
MCV: 103.2 fL — ABNORMAL HIGH (ref 78.0–100.0)
MONO ABS: 0.6 10*3/uL (ref 0.1–1.0)
MONOS PCT: 3 % (ref 3–12)
Neutro Abs: 16.5 10*3/uL — ABNORMAL HIGH (ref 1.7–7.7)
Neutrophils Relative %: 90 % — ABNORMAL HIGH (ref 43–77)
PLATELETS: 278 10*3/uL (ref 150–400)
RBC: 1.87 MIL/uL — AB (ref 3.87–5.11)
RDW: 17 % — ABNORMAL HIGH (ref 11.5–15.5)
WBC: 18.4 10*3/uL — ABNORMAL HIGH (ref 4.0–10.5)

## 2014-09-04 LAB — BASIC METABOLIC PANEL
ANION GAP: 12 (ref 5–15)
BUN: 84 mg/dL — ABNORMAL HIGH (ref 6–20)
CHLORIDE: 112 mmol/L — AB (ref 101–111)
CO2: 18 mmol/L — AB (ref 22–32)
CREATININE: 3.06 mg/dL — AB (ref 0.44–1.00)
Calcium: 8.4 mg/dL — ABNORMAL LOW (ref 8.9–10.3)
GFR calc Af Amer: 16 mL/min — ABNORMAL LOW (ref >60)
GFR calc non Af Amer: 14 mL/min — ABNORMAL LOW (ref >60)
Glucose, Bld: 109 mg/dL — ABNORMAL HIGH (ref 65–99)
Potassium: 4.3 mmol/L (ref 3.5–5.1)
Sodium: 142 mmol/L (ref 135–145)

## 2014-09-04 LAB — MRSA PCR SCREENING: MRSA by PCR: NEGATIVE

## 2014-09-04 LAB — PROTIME-INR
INR: 4.06 — ABNORMAL HIGH (ref 0.00–1.49)
PROTHROMBIN TIME: 38.4 s — AB (ref 11.6–15.2)

## 2014-09-04 LAB — HEMATOCRIT: HEMATOCRIT: 17.3 % — AB (ref 36.0–46.0)

## 2014-09-04 LAB — HEMOGLOBIN: Hemoglobin: 5.5 g/dL — CL (ref 12.0–15.0)

## 2014-09-04 LAB — POC OCCULT BLOOD, ED: Fecal Occult Bld: POSITIVE — AB

## 2014-09-04 MED ORDER — SODIUM CHLORIDE 0.9 % IV SOLN
INTRAVENOUS | Status: DC
  Administered 2014-09-04: 19:00:00 via INTRAVENOUS

## 2014-09-04 MED ORDER — SODIUM CHLORIDE 0.9 % IV SOLN
250.0000 mL | INTRAVENOUS | Status: DC | PRN
  Administered 2014-09-04: 250 mL via INTRAVENOUS

## 2014-09-04 MED ORDER — SODIUM CHLORIDE 0.9 % IV BOLUS (SEPSIS)
1000.0000 mL | Freq: Once | INTRAVENOUS | Status: AC
  Administered 2014-09-04: 1000 mL via INTRAVENOUS

## 2014-09-04 MED ORDER — PRAVASTATIN SODIUM 20 MG PO TABS
20.0000 mg | ORAL_TABLET | Freq: Every day | ORAL | Status: DC
  Administered 2014-09-04 – 2014-09-08 (×5): 20 mg via ORAL
  Filled 2014-09-04 (×6): qty 1

## 2014-09-04 MED ORDER — ACETAMINOPHEN 650 MG RE SUPP: 650.0000 mg | Freq: Four times a day (QID) | RECTAL | Status: DC | PRN

## 2014-09-04 MED ORDER — SODIUM CHLORIDE 0.9 % IV SOLN
80.0000 mg | Freq: Once | INTRAVENOUS | Status: AC
  Administered 2014-09-04: 80 mg via INTRAVENOUS
  Filled 2014-09-04: qty 80

## 2014-09-04 MED ORDER — CETYLPYRIDINIUM CHLORIDE 0.05 % MT LIQD
7.0000 mL | Freq: Two times a day (BID) | OROMUCOSAL | Status: DC
  Administered 2014-09-05 (×2): 7 mL via OROMUCOSAL

## 2014-09-04 MED ORDER — ONDANSETRON HCL 4 MG PO TABS: 4.0000 mg | ORAL_TABLET | Freq: Four times a day (QID) | ORAL | Status: DC | PRN

## 2014-09-04 MED ORDER — PANTOPRAZOLE SODIUM 40 MG IV SOLR
40.0000 mg | Freq: Two times a day (BID) | INTRAVENOUS | Status: DC
  Administered 2014-09-08 (×3): 40 mg via INTRAVENOUS
  Filled 2014-09-04 (×5): qty 40

## 2014-09-04 MED ORDER — ONDANSETRON HCL 4 MG/2ML IJ SOLN: 4.0000 mg | Freq: Four times a day (QID) | INTRAMUSCULAR | Status: DC | PRN

## 2014-09-04 MED ORDER — VALGANCICLOVIR HCL 450 MG PO TABS
450.0000 mg | ORAL_TABLET | ORAL | Status: DC
  Administered 2014-09-04 – 2014-09-07 (×2): 450 mg via ORAL
  Filled 2014-09-04 (×3): qty 1

## 2014-09-04 MED ORDER — PHYTONADIONE 5 MG PO TABS
5.0000 mg | ORAL_TABLET | Freq: Once | ORAL | Status: AC
  Administered 2014-09-04: 5 mg via ORAL
  Filled 2014-09-04: qty 1

## 2014-09-04 MED ORDER — HYDROCORTISONE 20 MG PO TABS
20.0000 mg | ORAL_TABLET | Freq: Two times a day (BID) | ORAL | Status: DC
  Administered 2014-09-04 – 2014-09-08 (×9): 20 mg via ORAL
  Filled 2014-09-04 (×12): qty 1

## 2014-09-04 MED ORDER — CITALOPRAM HYDROBROMIDE 40 MG PO TABS
40.0000 mg | ORAL_TABLET | Freq: Every day | ORAL | Status: DC
  Administered 2014-09-04 – 2014-09-08 (×5): 40 mg via ORAL
  Filled 2014-09-04: qty 1
  Filled 2014-09-04: qty 2
  Filled 2014-09-04 (×2): qty 1
  Filled 2014-09-04 (×2): qty 2

## 2014-09-04 MED ORDER — SODIUM CHLORIDE 0.9 % IV SOLN
Freq: Once | INTRAVENOUS | Status: AC
  Administered 2014-09-04: 14:00:00 via INTRAVENOUS

## 2014-09-04 MED ORDER — AMLODIPINE BESYLATE 5 MG PO TABS
2.5000 mg | ORAL_TABLET | Freq: Every day | ORAL | Status: DC
  Administered 2014-09-04: 2.5 mg via ORAL
  Filled 2014-09-04: qty 1

## 2014-09-04 MED ORDER — MYCOPHENOLATE MOFETIL 250 MG PO CAPS
500.0000 mg | ORAL_CAPSULE | Freq: Two times a day (BID) | ORAL | Status: DC
  Administered 2014-09-04 – 2014-09-08 (×9): 500 mg via ORAL
  Filled 2014-09-04 (×11): qty 2

## 2014-09-04 MED ORDER — DIPHENHYDRAMINE HCL 25 MG PO CAPS
25.0000 mg | ORAL_CAPSULE | Freq: Once | ORAL | Status: AC
  Administered 2014-09-05: 25 mg via ORAL
  Filled 2014-09-04: qty 1

## 2014-09-04 MED ORDER — ACETAMINOPHEN 325 MG PO TABS
650.0000 mg | ORAL_TABLET | Freq: Four times a day (QID) | ORAL | Status: DC | PRN
Start: 2014-09-04 — End: 2014-09-09

## 2014-09-04 MED ORDER — LEVOTHYROXINE SODIUM 100 MCG PO TABS
100.0000 ug | ORAL_TABLET | Freq: Every day | ORAL | Status: DC
  Administered 2014-09-05 – 2014-09-09 (×5): 100 ug via ORAL
  Filled 2014-09-04: qty 1
  Filled 2014-09-04: qty 2
  Filled 2014-09-04 (×3): qty 1
  Filled 2014-09-04: qty 2

## 2014-09-04 MED ORDER — SODIUM CHLORIDE 0.9 % IJ SOLN
3.0000 mL | Freq: Two times a day (BID) | INTRAMUSCULAR | Status: DC
  Administered 2014-09-04 – 2014-09-08 (×7): 3 mL via INTRAVENOUS

## 2014-09-04 MED ORDER — CYCLOSPORINE MODIFIED (NEORAL) 25 MG PO CAPS
125.0000 mg | ORAL_CAPSULE | Freq: Two times a day (BID) | ORAL | Status: DC
  Administered 2014-09-04 – 2014-09-08 (×9): 125 mg via ORAL
  Filled 2014-09-04 (×13): qty 5

## 2014-09-04 MED ORDER — SODIUM CHLORIDE 0.9 % IV SOLN
8.0000 mg/h | INTRAVENOUS | Status: AC
  Administered 2014-09-04 – 2014-09-07 (×7): 8 mg/h via INTRAVENOUS
  Filled 2014-09-04 (×14): qty 80

## 2014-09-04 MED ORDER — ONDANSETRON HCL 4 MG/2ML IJ SOLN
4.0000 mg | Freq: Once | INTRAMUSCULAR | Status: AC
  Administered 2014-09-04: 4 mg via INTRAVENOUS
  Filled 2014-09-04: qty 2

## 2014-09-04 MED ORDER — ISOSORBIDE MONONITRATE ER 60 MG PO TB24
30.0000 mg | ORAL_TABLET | Freq: Every day | ORAL | Status: DC
  Administered 2014-09-05: 30 mg via ORAL
  Filled 2014-09-04: qty 1

## 2014-09-04 MED ORDER — FENOFIBRATE 160 MG PO TABS
160.0000 mg | ORAL_TABLET | Freq: Every day | ORAL | Status: DC
  Filled 2014-09-04: qty 1

## 2014-09-04 MED ORDER — CARVEDILOL 6.25 MG PO TABS
6.2500 mg | ORAL_TABLET | Freq: Two times a day (BID) | ORAL | Status: DC
  Administered 2014-09-04 – 2014-09-08 (×8): 6.25 mg via ORAL
  Filled 2014-09-04 (×12): qty 1

## 2014-09-04 MED ORDER — CHLORHEXIDINE GLUCONATE 0.12 % MT SOLN
15.0000 mL | Freq: Two times a day (BID) | OROMUCOSAL | Status: DC
  Administered 2014-09-04 – 2014-09-09 (×9): 15 mL via OROMUCOSAL
  Filled 2014-09-04 (×9): qty 15

## 2014-09-04 MED ORDER — SODIUM CHLORIDE 0.9 % IJ SOLN: 3.0000 mL | INTRAMUSCULAR | Status: DC | PRN

## 2014-09-04 NOTE — Consult Note (Signed)
Referring Provider: [facility] Primary Care Physician:  Annye Asa, MD Primary Gastroenterologist:  Dr.Jacobs  Reason for Consultation:  UGI bleed, melena  HPI: Nancy Blair is a 79 y.o. female known to Dr Ardis Hughs from prior colonoscopy with adenomatous polyps and prior EGD with nonspecific gastritis.  She has a rather complex past medical history of emphysema, hypertension, hyperlipidemia, anemia, GERD, multiple allergies, adrenal insufficiency, hypothyroidism, chronic kidney disease stage IV, status post bilateral lung transplants, acute on chronic diastolic heart failure,  ( transthoracic echo 07/16/2013 LVEF 60-65%)diabetes, and recent DVT in May 2016.  Recently she was discharged from the hospital on May 13 after having sustained a DVT of the right leg. She was discharged on Lovenox and Coumadin. The patient lives at an assisted living facility and states that yesterday she developed a dull pain in the epigastric area with mild queasiness but no frank nausea or vomiting. Last night she began to have jet black loose stools. She was sent to the hospital and was found to have a hemoglobin of 6.1 down from her baseline of 8. Fecal occult blood testing was positive. At this time the patient feels well and denies epigastric pain, nausea, or or vomiting. She has no chest pain or shortness of breath and no fever. She has not had ulcers but was noted to have a mild gastritis on EGD in 2013. She has had a Nissen fundoplication in the past as well.   Past Medical History  Diagnosis Date  . Emphysema of lung   . Hypertension   . Hyperlipidemia   . Allergy   . Anemia   . GERD (gastroesophageal reflux disease)   . Adrenal insufficiency   . Squamous acanthoma of skin 06/2011    rt leg  . Hypothyroidism   . CKD (chronic kidney disease) stage 4, GFR 15-29 ml/min     since 2012 (per [facility] records)  . Lung transplant status, bilateral   . Acute on chronic diastolic heart failure   .  Ejection fraction   . Diabetes mellitus without complication   . DVT (deep venous thrombosis) 08/2014    RT LEG    Past Surgical History  Procedure Laterality Date  . Appendectomy    . Abdominal hysterectomy    . Tonsillectomy    . Total lung replacement  2012    b/l lung transplant  . Esophagogastroduodenoscopy  05/26/2011    Procedure: ESOPHAGOGASTRODUODENOSCOPY (EGD);  Surgeon: Owens Loffler, MD;  Location: [facility];  Service: Endoscopy;  Laterality: N/A;  . Nissen fundoplication      Prior to Admission medications   Medication Sig Start Date End Date Taking? Authorizing Provider  amLODipine (NORVASC) 2.5 MG tablet Take 1 tablet (2.5 mg total) by mouth daily. Patient taking differently: Take 2.5 mg by mouth daily at 10 pm.  05/25/14  Yes Midge Minium, MD  aspirin 81 MG chewable tablet Chew 81 mg by mouth daily.   Yes Historical Provider, MD  carvedilol (COREG) 6.25 MG tablet Take 1 tablet (6.25 mg total) by mouth 2 (two) times daily with a meal. 02/07/12  Yes Bonnielee Haff, MD  citalopram (CELEXA) 40 MG tablet TAKE 1 TABLET BY MOUTH DAILY Patient taking differently: TAKE 1 TABLET BY MOUTH DAILY AT BEDTIME 06/15/14  Yes Midge Minium, MD  cycloSPORINE modified (NEORAL) 25 MG capsule Take 125 mg by mouth 2 (two) times daily.    Yes Historical Provider, MD  diphenoxylate-atropine (LOMOTIL) 2.5-0.025 MG per tablet Take 1-2 tablets by  mouth See admin instructions. Take 2 tablets daily at lunch (2pm), may take 1 more tablet later in the day as needed for diarrhea   Yes Historical Provider, MD  fenofibrate 160 MG tablet TAKE ONE TABLET BY MOUTH EVERY DAY Patient taking differently: TAKE ONE TABLET BY MOUTH DAILY AT BEDTIME 08/14/14  Yes Midge Minium, MD  ferrous sulfate 325 (65 FE) MG tablet Take 325 mg by mouth daily.    Yes Historical Provider, MD  hydrocortisone (CORTEF) 20 MG tablet Take 20 mg by mouth 2 (two) times daily.  10/27/11  Yes Historical Provider, MD    isosorbide mononitrate (IMDUR) 30 MG 24 hr tablet Take 1 tablet (30 mg total) by mouth daily. 08/28/14  Yes Ripudeep Krystal Eaton, MD  levothyroxine (SYNTHROID, LEVOTHROID) 88 MCG tablet Take 88 mcg by mouth daily before breakfast.   Yes Historical Provider, MD  Multiple Vitamin (MULITIVITAMIN WITH MINERALS) TABS Take 1 tablet by mouth daily.   Yes Historical Provider, MD  mycophenolate (CELLCEPT) 500 MG tablet Take 500 mg by mouth 2 (two) times daily.  12/21/11  Yes Historical Provider, MD  potassium chloride SA (K-DUR,KLOR-CON) 20 MEQ tablet Take 1 tablet (20 mEq total) by mouth daily. 09/05/13  Yes Midge Minium, MD  pravastatin (PRAVACHOL) 20 MG tablet TAKE 1 TABLET BY MOUTH EVERY NIGHT AT BEDTIME 04/08/14  Yes Midge Minium, MD  sulfamethoxazole-trimethoprim (BACTRIM DS,SEPTRA DS) 800-160 MG per tablet Take 1 tablet by mouth every Monday, Wednesday, and Friday.   Yes Historical Provider, MD  valGANciclovir (VALCYTE) 450 MG tablet Take 450 mg by mouth every Monday, Wednesday, and Friday.    Yes Historical Provider, MD  warfarin (COUMADIN) 2 MG tablet Take 1 tablet (2 mg total) by mouth daily at 6 PM. Patient taking differently: Take 0.5 mg by mouth daily.  08/29/14  Yes Ripudeep K Rai, MD  enoxaparin (LOVENOX) 60 MG/0.6ML injection Inject 0.6 mLs (60 mg total) into the skin daily. Stop when INR is above 2 Patient not taking: Reported on 09/04/2014 08/29/14   Ripudeep Krystal Eaton, MD  furosemide (LASIX) 20 MG tablet Take 1 tablet (20 mg total) by mouth every other day. Takes on Monday, Wednesday, and Friday Patient not taking: Reported on 09/04/2014 10/23/13   Midge Minium, MD  levothyroxine (SYNTHROID, LEVOTHROID) 100 MCG tablet Take 1 tablet (100 mcg total) by mouth daily. Patient not taking: Reported on 09/04/2014 05/20/14   Midge Minium, MD    Current Facility-Administered Medications  Medication Dose Route Frequency Provider Last Rate Last Dose  . pantoprazole (PROTONIX) 80 mg in sodium  chloride 0.9 % 100 mL IVPB  80 mg Intravenous Once Jola Schmidt, MD      . pantoprazole (PROTONIX) 80 mg in sodium chloride 0.9 % 250 mL (0.32 mg/mL) infusion  8 mg/hr Intravenous Continuous Jola Schmidt, MD      . Derrill Memo ON 09/08/2014] pantoprazole (PROTONIX) injection 40 mg  40 mg Intravenous Q12H Jola Schmidt, MD       Current Outpatient Prescriptions  Medication Sig Dispense Refill  . amLODipine (NORVASC) 2.5 MG tablet Take 1 tablet (2.5 mg total) by mouth daily. (Patient taking differently: Take 2.5 mg by mouth daily at 10 pm. ) 30 tablet 6  . aspirin 81 MG chewable tablet Chew 81 mg by mouth daily.    . carvedilol (COREG) 6.25 MG tablet Take 1 tablet (6.25 mg total) by mouth 2 (two) times daily with a meal. 60 tablet 1  . citalopram (CELEXA)  40 MG tablet TAKE 1 TABLET BY MOUTH DAILY (Patient taking differently: TAKE 1 TABLET BY MOUTH DAILY AT BEDTIME) 30 tablet 3  . cycloSPORINE modified (NEORAL) 25 MG capsule Take 125 mg by mouth 2 (two) times daily.     . diphenoxylate-atropine (LOMOTIL) 2.5-0.025 MG per tablet Take 1-2 tablets by mouth See admin instructions. Take 2 tablets daily at lunch (2pm), may take 1 more tablet later in the day as needed for diarrhea    . fenofibrate 160 MG tablet TAKE ONE TABLET BY MOUTH EVERY DAY (Patient taking differently: TAKE ONE TABLET BY MOUTH DAILY AT BEDTIME) 90 tablet 1  . ferrous sulfate 325 (65 FE) MG tablet Take 325 mg by mouth daily.     . hydrocortisone (CORTEF) 20 MG tablet Take 20 mg by mouth 2 (two) times daily.     . isosorbide mononitrate (IMDUR) 30 MG 24 hr tablet Take 1 tablet (30 mg total) by mouth daily. 90 tablet 0  . levothyroxine (SYNTHROID, LEVOTHROID) 88 MCG tablet Take 88 mcg by mouth daily before breakfast.    . Multiple Vitamin (MULITIVITAMIN WITH MINERALS) TABS Take 1 tablet by mouth daily.    . mycophenolate (CELLCEPT) 500 MG tablet Take 500 mg by mouth 2 (two) times daily.     . potassium chloride SA (K-DUR,KLOR-CON) 20 MEQ tablet  Take 1 tablet (20 mEq total) by mouth daily. 30 tablet 6  . pravastatin (PRAVACHOL) 20 MG tablet TAKE 1 TABLET BY MOUTH EVERY NIGHT AT BEDTIME 30 tablet 4  . sulfamethoxazole-trimethoprim (BACTRIM DS,SEPTRA DS) 800-160 MG per tablet Take 1 tablet by mouth every Monday, Wednesday, and Friday.    . valGANciclovir (VALCYTE) 450 MG tablet Take 450 mg by mouth every Monday, Wednesday, and Friday.     . warfarin (COUMADIN) 2 MG tablet Take 1 tablet (2 mg total) by mouth daily at 6 PM. (Patient taking differently: Take 0.5 mg by mouth daily. ) 15 tablet 0  . enoxaparin (LOVENOX) 60 MG/0.6ML injection Inject 0.6 mLs (60 mg total) into the skin daily. Stop when INR is above 2 (Patient not taking: Reported on 09/04/2014) 5 Syringe 0  . furosemide (LASIX) 20 MG tablet Take 1 tablet (20 mg total) by mouth every other day. Takes on Monday, Wednesday, and Friday (Patient not taking: Reported on 09/04/2014) 90 tablet 0  . levothyroxine (SYNTHROID, LEVOTHROID) 100 MCG tablet Take 1 tablet (100 mcg total) by mouth daily. (Patient not taking: Reported on 09/04/2014) 30 tablet 3    Allergies as of 09/04/2014 - Review Complete 09/04/2014  Allergen Reaction Noted  . Grapefruit extract Other (See Comments) 08/08/2012  . Oysters [shellfish allergy] Swelling 08/08/2012  . Pineapple Swelling 07/16/2013  . Bupropion Other (See Comments) 11/11/2012  . Neomycin-bacitracin zn-polymyx Hives and Other (See Comments)   . Penicillins Swelling   . Prednisone Other (See Comments) 02/06/2012    Family History  Problem Relation Age of Onset  . Heart disease Mother   . Heart disease Father   . Pancreatic cancer Sister   . Colon cancer Neg Hx   . Malignant hyperthermia Neg Hx   . Hypertension Mother   . Hypertension Father     History   Social History  . Marital Status: Married    Spouse Name: N/A  . Number of Children: N/A  . Years of Education: N/A   Occupational History  . retired     Teacher, adult education   Social History  Main Topics  . Smoking status: Former Smoker --  3.00 packs/day    Start date: 04/17/1952    Quit date: 07/16/1993  . Smokeless tobacco: Never Used     Comment: quit 25 years ago  . Alcohol Use: No  . Drug Use: No  . Sexual Activity: No   Other Topics Concern  . Not on file   Social History Narrative   Lives with husband   Is a DNAR   Daughter who is an Rn [phone]    Review of Systems: Gen: Denies any fever, chills, sweats, anorexia, fatigue, weakness, malaise, weight loss, and sleep disorder CV: Denies chest pain, angina, palpitations, syncope, orthopnea, PND, peripheral edema, and claudication. Resp: Denies dyspnea at rest, dyspnea with exercise, cough, sputum, wheezing, coughing up blood, and pleurisy. GI: Denies vomiting blood, jaundice, and fecal incontinence.  Has had dark loose stools GU : Denies urinary burning, blood in urine, urinary frequency, urinary hesitancy, nocturnal urination, and urinary incontinence. MS: Denies joint pain, limitation of movement, and swelling, stiffness, low back pain, extremity pain. Denies muscle weakness, cramps, atrophy.  Derm: Denies rash, itching, dry skin, hives, moles, warts, or unhealing ulcers.  Psych: Denies depression, anxiety, memory loss, suicidal ideation, hallucinations, paranoia, and confusion. Heme: He bruises easily Neuro:  Denies any headaches, dizziness, paresthesias. Endo:  Has diabetes and hypotension  Physical Exam: Vital signs in last 24 hours: Temp:  [98.3 F (36.8 C)] 98.3 F (36.8 C) (05/20 1156) Pulse Rate:  [89-94] 89 (05/20 1430) Resp:  [17-22] 22 (05/20 1430) BP: (144-169)/(62-103) 169/66 mmHg (05/20 1430) SpO2:  [96 %-100 %] 97 % (05/20 1430)   General:   Alert, pale, elderly,  Well-developed, well-nourished, pleasant and cooperative in NAD Head:  Normocephalic and atraumatic. Eyes:  Sclera clear, no icterus.   Conjunctiva pale Ears:  Normal auditory acuity. Nose:  No deformity, discharge,  or  lesions. Mouth:  No deformity or lesions.   Neck:  Supple; no masses or thyromegaly. Lungs:  Clear throughout to auscultation.     Heart:  Regular rate and rhythm Abdomen:  Soft,nontender, BS active,nonpalp mass or hsm.   Rectal:  Deferred  Msk:  Symmetrical without gross deformities. . Pulses:  Normal pulses noted. Extremities:Without clubbing or edema. Neurologic: Alert and  oriented x4;  grossly normal neurologically. Skin: Intact without significant lesions or rashes.. Psych: Alert and cooperative. Normal mood and affect.  Intake/Output from previous day:   Intake/Output this shift: Total I/O In: 2000 [I.V.:2000] Out: -   Lab Results:  Recent Labs  09/04/14 1209  WBC 18.4*  HGB 6.1*  HCT 19.3*  PLT 278   BMET  Recent Labs  09/04/14 1209  NA 142  K 4.3  CL 112*  CO2 18*  GLUCOSE 109*  BUN 84*  CREATININE 3.06*  CALCIUM 8.4*     PT/INR  Recent Labs  09/04/14 1209  LABPROT 38.4*  INR 4.06*   Endoscopies:  EGD 05/26/11 mild nonspecific gastritis Colonoscopy 12/29/09 ENDOSCOPIC IMPRESSION:  1) Three small polyps, all removed and sent to pathology  2) Melanosis throughout the colon  3) Mild diverticulosis in the sigmoid to descending colon  segments  4) Otherwise normal examination    RECOMMENDATIONS:  1) If the polyp(s) removed today are proven to be adenomatous  (pre-cancerous) polyps, you will need a colonoscopy in 3-5 years.  Otherwise you should continue to follow colorectal cancer  screening guidelines for "routine risk" patients with a  colonoscopy in 10 years.  2) You will receive a letter within 1-2 weeks with the results  of your biopsy as well as final recommendations. Please call my  office if you have not received a letter after 3 weeks   IMPRESSION/PLAN: #1. Melena. Patient has had epigastric pain since yesterday and has had multiple dark stools last evening. Likely has an upper GI  bleed. Would hold aspirin and anticoagulants at this time. Continue Protonix infusion. Will need EGD when stable.  #2. Anemia. Patient's hemoglobin today is 6.1 with transfused to keep up of 8. Monitor H&H.  #3. Status post bilateral lung transplant.? If patient may have gastritis from hydrocortisone, as well as the other suppressants she is on.  #4. Recent DVT right lower extremity. INR today 4.06. Patient has been given vitamin K 5 mg IV. Repeat PT and INR in the morning has been ordered.? If IVC filter they be an option for this patient.  #5. Chronic kidney disease stage IV. Previous creatinines were in 2.8 range now elevated to 3.06 . Hopefully will improve after blood transfusion/fluid resuscitation.  Will review with attending as to further recommendations. Thank you  Hvozdovic, Deloris Ping 09/04/2014,  Pager [phone]  GI ATTENDING  History, laboratories, prior endoscopy reports, recent hospitalization reviewed. Patient personally seen and examined. Daughter at bedside. Agree with extensive consultation note as outlined above. IMPRESSION 1. Acute upper GI bleed in the face of supratherapeutic anticoagulation. Hemodynamically stable 2. Iatrogenic coagulopathy. Supratherapeutic. 3. Acute blood loss anemia 4. Multiple medical problems including recent DVT, history of bilateral lung transplant, and chronic renal insufficiency 5. Poor IV access RECOMMENDATIONS 1. Correction of coagulopathy 2. Transfuse to hemoglobin of 8 3. IV PPI bolus followed by continuous PPI infusion 4. Admitted to stepdown or ICU for closer observation 5. Improve IV access. May need PICC line. Primary service to address 6. Issues regarding DVT. Does she need IVC filter? This is out of my area of expertise. Primary service to address. 7. Upper endoscopy when coagulopathy corrected. Hopefully sooner rather than later. Dr. Benson Norway on call for GI this evening and this weekend to follow patient. Discussed with patient  and daughter. Thank you  Docia Chuck. Geri Seminole., M.D. [facility] Division of Gastroenterology

## 2014-09-04 NOTE — Progress Notes (Signed)
Dear Doctor: Nancy Blair This patient has been identified as a candidate for PICC for the following reason (s): IV therapy over 48 hours and poor veins/poor circulatory system (CHF, COPD, emphysema, diabetes, steroid use, IV drug abuse, etc.) If you agree, please write an order for the indicated device. For any questions contact the Vascular Access Team at [phone] if no answer, please leave a message.  Thank you for supporting the early vascular access assessment program.

## 2014-09-04 NOTE — H&P (Addendum)
PCP:   Annye Asa, MD   Chief Complaint:  Diarrhea  HPI: 79 year old female who   has a past medical history of Emphysema of lung; Hypertension; Hyperlipidemia; Allergy; Anemia; GERD (gastroesophageal reflux disease); Adrenal insufficiency; Squamous acanthoma of skin (06/2011); Hypothyroidism; CKD (chronic kidney disease) stage 4, GFR 15-29 ml/min; Lung transplant status, bilateral; Acute on chronic diastolic heart failure; Ejection fraction; Diabetes mellitus without complication; and DVT (deep venous thrombosis) (08/2014). Patient was recently discharged from the hospital on 08/28/2014 after she was diagnosed with right leg DVT. At that time patient was discharged on Lovenox as well as Coumadin, she has a history of bilateral lung transplants and is on multiple immunosuppressants including CellCept, hydrocortisone 20 mg twice a day. Last night patient developed abdominal pain and loose bowel movements. Patient was sent to the hospital for further evaluation. In the ED she was found to be anemic with hemoglobin 6.1, her baseline hemoglobin is 8. FOBT was positive. GI is being consulted by ED physician. Patient denies any pain at this time, denies nausea vomiting. Says that she had 4-5 loose bowel movements last night. She denies chest pain or shortness of breath, no fever no cough. Patient does not have any history of gastric ulcers. Patient had Nissen fundoplication done off to the lung cancer and surgery  Allergies:   Allergies  Allergen Reactions  . Grapefruit Extract Other (See Comments)    Cannot take with meds  . Oysters [Shellfish Allergy] Swelling  . Pineapple Swelling  . Bupropion Other (See Comments)    sleepy  . Neomycin-Bacitracin Zn-Polymyx Hives and Other (See Comments)    Blisters (pt has used small amounts without a reaction)  . Penicillins Swelling    Facial swelling  . Prednisone Other (See Comments)    crazy      Past Medical History  Diagnosis Date  .  Emphysema of lung   . Hypertension   . Hyperlipidemia   . Allergy   . Anemia   . GERD (gastroesophageal reflux disease)   . Adrenal insufficiency   . Squamous acanthoma of skin 06/2011    rt leg  . Hypothyroidism   . CKD (chronic kidney disease) stage 4, GFR 15-29 ml/min     since 2012 (per [facility] records)  . Lung transplant status, bilateral   . Acute on chronic diastolic heart failure   . Ejection fraction   . Diabetes mellitus without complication   . DVT (deep venous thrombosis) 08/2014    RT LEG    Past Surgical History  Procedure Laterality Date  . Appendectomy    . Abdominal hysterectomy    . Tonsillectomy    . Total lung replacement  2012    b/l lung transplant  . Esophagogastroduodenoscopy  05/26/2011    Procedure: ESOPHAGOGASTRODUODENOSCOPY (EGD);  Surgeon: Owens Loffler, MD;  Location: [facility];  Service: Endoscopy;  Laterality: N/A;  . Nissen fundoplication      Prior to Admission medications   Medication Sig Start Date End Date Taking? Authorizing Provider  amLODipine (NORVASC) 2.5 MG tablet Take 1 tablet (2.5 mg total) by mouth daily. Patient taking differently: Take 2.5 mg by mouth daily at 10 pm.  05/25/14  Yes Midge Minium, MD  aspirin 81 MG chewable tablet Chew 81 mg by mouth daily.   Yes Historical Provider, MD  carvedilol (COREG) 6.25 MG tablet Take 1 tablet (6.25 mg total) by mouth 2 (two) times daily with a meal. 02/07/12  Yes Bonnielee Haff, MD  citalopram (  CELEXA) 40 MG tablet TAKE 1 TABLET BY MOUTH DAILY Patient taking differently: TAKE 1 TABLET BY MOUTH DAILY AT BEDTIME 06/15/14  Yes Midge Minium, MD  cycloSPORINE modified (NEORAL) 25 MG capsule Take 125 mg by mouth 2 (two) times daily.    Yes Historical Provider, MD  diphenoxylate-atropine (LOMOTIL) 2.5-0.025 MG per tablet Take 1-2 tablets by mouth See admin instructions. Take 2 tablets daily at lunch (2pm), may take 1 more tablet later in the day as needed for diarrhea   Yes Historical  Provider, MD  fenofibrate 160 MG tablet TAKE ONE TABLET BY MOUTH EVERY DAY Patient taking differently: TAKE ONE TABLET BY MOUTH DAILY AT BEDTIME 08/14/14  Yes Midge Minium, MD  ferrous sulfate 325 (65 FE) MG tablet Take 325 mg by mouth daily.    Yes Historical Provider, MD  hydrocortisone (CORTEF) 20 MG tablet Take 20 mg by mouth 2 (two) times daily.  10/27/11  Yes Historical Provider, MD  isosorbide mononitrate (IMDUR) 30 MG 24 hr tablet Take 1 tablet (30 mg total) by mouth daily. 08/28/14  Yes Ripudeep Krystal Eaton, MD  levothyroxine (SYNTHROID, LEVOTHROID) 88 MCG tablet Take 88 mcg by mouth daily before breakfast.   Yes Historical Provider, MD  Multiple Vitamin (MULITIVITAMIN WITH MINERALS) TABS Take 1 tablet by mouth daily.   Yes Historical Provider, MD  mycophenolate (CELLCEPT) 500 MG tablet Take 500 mg by mouth 2 (two) times daily.  12/21/11  Yes Historical Provider, MD  potassium chloride SA (K-DUR,KLOR-CON) 20 MEQ tablet Take 1 tablet (20 mEq total) by mouth daily. 09/05/13  Yes Midge Minium, MD  pravastatin (PRAVACHOL) 20 MG tablet TAKE 1 TABLET BY MOUTH EVERY NIGHT AT BEDTIME 04/08/14  Yes Midge Minium, MD  sulfamethoxazole-trimethoprim (BACTRIM DS,SEPTRA DS) 800-160 MG per tablet Take 1 tablet by mouth every Monday, Wednesday, and Friday.   Yes Historical Provider, MD  valGANciclovir (VALCYTE) 450 MG tablet Take 450 mg by mouth every Monday, Wednesday, and Friday.    Yes Historical Provider, MD  warfarin (COUMADIN) 2 MG tablet Take 1 tablet (2 mg total) by mouth daily at 6 PM. Patient taking differently: Take 0.5 mg by mouth daily.  08/29/14  Yes Ripudeep K Rai, MD  enoxaparin (LOVENOX) 60 MG/0.6ML injection Inject 0.6 mLs (60 mg total) into the skin daily. Stop when INR is above 2 Patient not taking: Reported on 09/04/2014 08/29/14   Ripudeep Krystal Eaton, MD  furosemide (LASIX) 20 MG tablet Take 1 tablet (20 mg total) by mouth every other day. Takes on Monday, Wednesday, and Friday Patient  not taking: Reported on 09/04/2014 10/23/13   Midge Minium, MD  levothyroxine (SYNTHROID, LEVOTHROID) 100 MCG tablet Take 1 tablet (100 mcg total) by mouth daily. Patient not taking: Reported on 09/04/2014 05/20/14   Midge Minium, MD    Social History:  reports that she quit smoking about 21 years ago. She started smoking about 62 years ago. She has never used smokeless tobacco. She reports that she does not drink alcohol or use illicit drugs.  Family History  Problem Relation Age of Onset  . Heart disease Mother   . Heart disease Father   . Pancreatic cancer Sister   . Colon cancer Neg Hx   . Malignant hyperthermia Neg Hx   . Hypertension Mother   . Hypertension Father      All the positives are listed in BOLD  Review of Systems:  HEENT: Headache, blurred vision, runny nose, sore throat Neck: Hypothyroidism,  hyperthyroidism,,lymphadenopathy Chest : Shortness of breath, history of COPD, Asthma Heart : Chest pain, history of coronary arterey disease GI:  Nausea, vomiting, diarrhea, constipation, GERD GU: Dysuria, urgency, frequency of urination, hematuria Neuro: Stroke, seizures, syncope Psych: Depression, anxiety, hallucinations   Physical Exam: Blood pressure 169/66, pulse 89, temperature 98.3 F (36.8 C), temperature source Oral, resp. rate 22, SpO2 97 %. Constitutional:   Patient is a well-developed and well-nourished *female in no acute distress and cooperative with exam. Head: Normocephalic and atraumatic Mouth: Mucus membranes moist Eyes: PERRL, EOMI, conjunctivae normal Neck: Supple, No Thyromegaly Cardiovascular: RRR, S1 normal, S2 normal Pulmonary/Chest: CTAB, no wheezes, rales, or rhonchi Abdominal: Soft. Non-tender, non-distended, bowel sounds are normal, no masses, organomegaly, or guarding present.  Neurological: A&O x3, Strength is normal and symmetric bilaterally, cranial nerve II-XII are grossly intact, no focal motor deficit, sensory intact to light  touch bilaterally.  Extremities : No Cyanosis, Clubbing or Edema, ecchymosis noted in the lower extremities  Labs on Admission:  Basic Metabolic Panel:  Recent Labs Lab 09/04/14 1209  NA 142  K 4.3  CL 112*  CO2 18*  GLUCOSE 109*  BUN 84*  CREATININE 3.06*  CALCIUM 8.4*   CBC:  Recent Labs Lab 09/04/14 1209  WBC 18.4*  NEUTROABS 16.5*  HGB 6.1*  HCT 19.3*  MCV 103.2*  PLT 278      Assessment/Plan Active Problems:   Hypothyroidism   Lung transplant status, bilateral   Squamous cell carcinoma of leg   DVT of lower extremity (deep venous thrombosis)   Melena  Melena Likely from upper GI bleed, Dr. Venora Maples is going to contact [facility]. Patient has been started on Protonix infusion, will hold aspirin at this time. Patient's blood pressure is stable, will be admitted to step down.  Anemia Patient's hemoglobin is 6.1, will transfuse 2 units PRBC. We'll check H&H every 6 hours.  CKD stage IV Patient has C KD stage IV, creatinine is mildly elevated to 3.06, her baseline is around 2.8. Patient to get blood transfusion him a repeat BMP in a.m.  History of lung transplant Patient is on multiple suppressants, hydrocortisone, CellCept, cyclosporine. I will continue all these medications. It's possible the patient may have developed gastric lesions from the hydrocortisone. Will await GI recommendations  DVT right lower extremity Patient was diagnosed with DVT in the right lower extremity, Coumadin was started today INR is 4.06. Coumadin will be held vitamin K 5 mg IV 1 will be given. Follow PT/INR in a.m.  Hypertension Patient blood pressure is elevated, will continue home medications: Coreg 6.25 twice a day, Amlodipine 2.5 mg daily.  Code status: DNR  Family discussion: Admission, patients condition and plan of care including tests being ordered have been discussed with the patient and *her daughter* who indicate understanding and agree with the plan and Code  Status.   Time Spent on Admission: 60 min  [facility] Pager: [phone] 09/04/2014, 3:03 PM  If 7PM-7AM, please contact night-coverage  [email or web address]  Password [email or web address]

## 2014-09-04 NOTE — ED Notes (Addendum)
Nurse drawing labs. 

## 2014-09-04 NOTE — ED Notes (Signed)
Attempted to give report, RN is unavailable

## 2014-09-04 NOTE — ED Notes (Signed)
EMS called d/t patient c/o N/V/D, possible bloody stools starting yesterday. Patient is from [place] [place]. VS were variable 171/141, 76/47. Pt is on coumadin last dose Last night.

## 2014-09-04 NOTE — ED Notes (Signed)
Spoke with M.Hamby RN whom reported pt HGB 6.1, call placed to provider result reported

## 2014-09-04 NOTE — ED Notes (Signed)
Over all patiens skin is very thin, bruises to bilateral arms. Sacrum and buttocks red

## 2014-09-04 NOTE — ED Provider Notes (Signed)
CSN: [id number]     Arrival date & time 09/04/14  1122 History   First MD Initiated Contact with Patient 09/04/14 1128     Chief Complaint  Patient presents with  . Rectal Bleeding  . Emesis  . Diarrhea  . Nausea      HPI Patient presents to emergency department complaints of nausea as well as diarrhea with some blood in the stool.  Patient was recently started on Coumadin for a new lower extremity DVT.  Today her INR is 4.0.  She reports darker stools over the past 24 hours.  No history of ulcer.  She reports ongoing nausea without abdominal pain this time.  She reports intermittent upper abdominal discomfort.  No vomiting thus far.  No significant pain in this time.  Vital signs are normal.  She denies significant weakness at this time.   Past Medical History  Diagnosis Date  . Emphysema of lung   . Hypertension   . Hyperlipidemia   . Allergy   . Anemia   . GERD (gastroesophageal reflux disease)   . Adrenal insufficiency   . Squamous acanthoma of skin 06/2011    rt leg  . Hypothyroidism   . CKD (chronic kidney disease) stage 4, GFR 15-29 ml/min     since 2012 (per [facility] records)  . Lung transplant status, bilateral   . Acute on chronic diastolic heart failure   . Ejection fraction   . Diabetes mellitus without complication   . DVT (deep venous thrombosis) 08/2014    RT LEG   Past Surgical History  Procedure Laterality Date  . Appendectomy    . Abdominal hysterectomy    . Tonsillectomy    . Total lung replacement  2012    b/l lung transplant  . Esophagogastroduodenoscopy  05/26/2011    Procedure: ESOPHAGOGASTRODUODENOSCOPY (EGD);  Surgeon: Owens Loffler, MD;  Location: [facility];  Service: Endoscopy;  Laterality: N/A;  . Nissen fundoplication     Family History  Problem Relation Age of Onset  . Heart disease Mother   . Heart disease Father   . Pancreatic cancer Sister   . Colon cancer Neg Hx   . Malignant hyperthermia Neg Hx   . Hypertension Mother   .  Hypertension Father    History  Substance Use Topics  . Smoking status: Former Smoker -- 3.00 packs/day    Start date: 04/17/1952    Quit date: 07/16/1993  . Smokeless tobacco: Never Used     Comment: quit 25 years ago  . Alcohol Use: No   OB History    No data available     Review of Systems  All other systems reviewed and are negative.     Allergies  Grapefruit extract; Oysters; Pineapple; Bupropion; Neomycin-bacitracin zn-polymyx; Penicillins; and Prednisone  Home Medications   Prior to Admission medications   Medication Sig Start Date End Date Taking? Authorizing Provider  amLODipine (NORVASC) 2.5 MG tablet Take 1 tablet (2.5 mg total) by mouth daily. Patient taking differently: Take 2.5 mg by mouth daily at 10 pm.  05/25/14   Midge Minium, MD  aspirin 81 MG chewable tablet Chew 81 mg by mouth daily.    Historical Provider, MD  carvedilol (COREG) 6.25 MG tablet Take 1 tablet (6.25 mg total) by mouth 2 (two) times daily with a meal. 02/07/12   Bonnielee Haff, MD  citalopram (CELEXA) 40 MG tablet TAKE 1 TABLET BY MOUTH DAILY Patient taking differently: TAKE 1 TABLET BY MOUTH DAILY AT BEDTIME  06/15/14   Midge Minium, MD  cycloSPORINE modified (NEORAL) 25 MG capsule Take 125 mg by mouth 2 (two) times daily.     Historical Provider, MD  diphenoxylate-atropine (LOMOTIL) 2.5-0.025 MG per tablet Take 1-2 tablets by mouth See admin instructions. Take 2 tablets daily at lunch (2pm), may take 1 more tablet later in the day as needed for diarrhea    Historical Provider, MD  enoxaparin (LOVENOX) 60 MG/0.6ML injection Inject 0.6 mLs (60 mg total) into the skin daily. Stop when INR is above 2 08/29/14   Ripudeep Krystal Eaton, MD  fenofibrate 160 MG tablet TAKE ONE TABLET BY MOUTH EVERY DAY Patient taking differently: TAKE ONE TABLET BY MOUTH DAILY AT BEDTIME 08/14/14   Midge Minium, MD  ferrous sulfate 325 (65 FE) MG tablet Take 325 mg by mouth daily.     Historical Provider, MD   furosemide (LASIX) 20 MG tablet Take 1 tablet (20 mg total) by mouth every other day. Takes on Monday, Wednesday, and Friday Patient taking differently: Take 20 mg by mouth daily as needed for fluid or edema. Takes on Monday, Wednesday, and Frida 10/23/13   Midge Minium, MD  hydrocortisone (CORTEF) 20 MG tablet Take 20 mg by mouth 2 (two) times daily.  10/27/11   Historical Provider, MD  isosorbide mononitrate (IMDUR) 30 MG 24 hr tablet Take 1 tablet (30 mg total) by mouth daily. 08/28/14   Ripudeep Krystal Eaton, MD  levothyroxine (SYNTHROID, LEVOTHROID) 100 MCG tablet Take 1 tablet (100 mcg total) by mouth daily. 05/20/14   Midge Minium, MD  Multiple Vitamin (MULITIVITAMIN WITH MINERALS) TABS Take 1 tablet by mouth daily.    Historical Provider, MD  mycophenolate (CELLCEPT) 500 MG tablet Take 500 mg by mouth 2 (two) times daily.  12/21/11   Historical Provider, MD  potassium chloride SA (K-DUR,KLOR-CON) 20 MEQ tablet Take 1 tablet (20 mEq total) by mouth daily. 09/05/13   Midge Minium, MD  pravastatin (PRAVACHOL) 20 MG tablet TAKE 1 TABLET BY MOUTH EVERY NIGHT AT BEDTIME 04/08/14   Midge Minium, MD  valGANciclovir (VALCYTE) 450 MG tablet Take 450 mg by mouth every Monday, Wednesday, and Friday.     Historical Provider, MD  warfarin (COUMADIN) 2 MG tablet Take 1 tablet (2 mg total) by mouth daily at 6 PM. 08/29/14   Ripudeep K Rai, MD   Pulse 94  Temp(Src) 98.3 F (36.8 C) (Oral)  Resp 20  SpO2 98% Physical Exam  Constitutional: She is oriented to person, place, and time. She appears well-developed and well-nourished. No distress.  HENT:  Head: Normocephalic and atraumatic.  Eyes: EOM are normal.  Neck: Normal range of motion.  Cardiovascular: Normal rate, regular rhythm and normal heart sounds.   Pulmonary/Chest: Effort normal and breath sounds normal.  Abdominal: Soft. She exhibits no distension. There is no tenderness.  Genitourinary:  No gross blood on rectal examination.   Melena present.  Hemoccult-positive  Musculoskeletal: Normal range of motion.  Neurological: She is alert and oriented to person, place, and time.  Skin: Skin is warm and dry.  Psychiatric: She has a normal mood and affect. Judgment normal.  Nursing note and vitals reviewed.   ED Course  Procedures (including critical care time)  CRITICAL CARE Performed by: Hoy Morn Total critical care time: 32 Critical care time was exclusive of separately billable procedures and treating other patients. Critical care was necessary to treat or prevent imminent or life-threatening deterioration. Critical care was time  spent personally by me on the following activities: development of treatment plan with patient and/or surrogate as well as nursing, discussions with consultants, evaluation of patient's response to treatment, examination of patient, obtaining history from patient or surrogate, ordering and performing treatments and interventions, ordering and review of laboratory studies, ordering and review of radiographic studies, pulse oximetry and re-evaluation of patient's condition.   Labs Review Labs Reviewed  CBC WITH DIFFERENTIAL/PLATELET - Abnormal; Notable for the following:    WBC 18.4 (*)    RBC 1.87 (*)    Hemoglobin 6.1 (*)    HCT 19.3 (*)    MCV 103.2 (*)    RDW 17.0 (*)    Neutrophils Relative % 90 (*)    Lymphocytes Relative 7 (*)    Neutro Abs 16.5 (*)    All other components within normal limits  BASIC METABOLIC PANEL - Abnormal; Notable for the following:    Chloride 112 (*)    CO2 18 (*)    Glucose, Bld 109 (*)    BUN 84 (*)    Creatinine, Ser 3.06 (*)    Calcium 8.4 (*)    GFR calc non Af Amer 14 (*)    GFR calc Af Amer 16 (*)    All other components within normal limits  PROTIME-INR - Abnormal; Notable for the following:    Prothrombin Time 38.4 (*)    INR 4.06 (*)    All other components within normal limits  POC OCCULT BLOOD, ED - Abnormal; Notable for the  following:    Fecal Occult Bld POSITIVE (*)    All other components within normal limits  OCCULT BLOOD X 1 CARD TO LAB, STOOL  PREPARE RBC (CROSSMATCH)  TYPE AND SCREEN   BUN  Date Value Ref Range Status  09/04/2014 84* 6 - 20 mg/dL Final  08/28/2014 46* 6 - 20 mg/dL Final  08/27/2014 47* 6 - 20 mg/dL Final  08/26/2014 41* 6 - 20 mg/dL Final   CREAT  Date Value Ref Range Status  07/19/2012 3.68* 0.50 - 1.10 mg/dL Final  05/22/2011 2.01* 0.50 - 1.10 mg/dL Final   CREATININE, SER  Date Value Ref Range Status  09/04/2014 3.06* 0.44 - 1.00 mg/dL Final  08/28/2014 2.96* 0.44 - 1.00 mg/dL Final  08/27/2014 3.10* 0.44 - 1.00 mg/dL Final  08/26/2014 2.88* 0.44 - 1.00 mg/dL Final       Imaging Review No results found.   EKG Interpretation None      MDM   Final diagnoses:  Gastrointestinal hemorrhage with melena  Anemia, unspecified anemia type    Suspect upper GI bleed given melena as well as nausea and upper abdominal discomfort.  No hematemesis.  Patient be given vitamin K at this time for her INR 4.  Started on a Protonix drip.  Hospitalist and GI consultation.  Patient be admitted to stepdown unit.  Blood transfusion now for hemoglobin of 6.1.  2:54 PM Spoke with [facility] who will see the patient either in the ER now or upstairs.   Jola Schmidt, MD 09/04/14 1630

## 2014-09-04 NOTE — ED Notes (Signed)
Bed: FU83 Expected date:  Expected time:  Means of arrival:  Comments: 79 y/o F NVD

## 2014-09-05 DIAGNOSIS — K921 Melena: Secondary | ICD-10-CM | POA: Insufficient documentation

## 2014-09-05 DIAGNOSIS — E039 Hypothyroidism, unspecified: Secondary | ICD-10-CM

## 2014-09-05 DIAGNOSIS — N179 Acute kidney failure, unspecified: Secondary | ICD-10-CM

## 2014-09-05 LAB — PROTIME-INR
INR: 4.17 — ABNORMAL HIGH (ref 0.00–1.49)
PROTHROMBIN TIME: 39.2 s — AB (ref 11.6–15.2)

## 2014-09-05 LAB — TYPE AND SCREEN
ABO/RH(D): O POS
ANTIBODY SCREEN: NEGATIVE
UNIT DIVISION: 0
UNIT DIVISION: 0

## 2014-09-05 LAB — HEMOGLOBIN AND HEMATOCRIT, BLOOD
HCT: 24.2 % — ABNORMAL LOW (ref 36.0–46.0)
HCT: 24 % — ABNORMAL LOW (ref 36.0–46.0)
HCT: 23.3 % — ABNORMAL LOW (ref 36.0–46.0)
HEMOGLOBIN: 7.7 g/dL — AB (ref 12.0–15.0)
HEMOGLOBIN: 7.9 g/dL — AB (ref 12.0–15.0)
Hemoglobin: 7.8 g/dL — ABNORMAL LOW (ref 12.0–15.0)

## 2014-09-05 LAB — COMPREHENSIVE METABOLIC PANEL
ALBUMIN: 2.4 g/dL — AB (ref 3.5–5.0)
ALK PHOS: 24 U/L — AB (ref 38–126)
ALT: 18 U/L (ref 14–54)
AST: 17 U/L (ref 15–41)
Anion gap: 10 (ref 5–15)
BUN: 67 mg/dL — ABNORMAL HIGH (ref 6–20)
CALCIUM: 7.8 mg/dL — AB (ref 8.9–10.3)
CO2: 16 mmol/L — AB (ref 22–32)
Chloride: 117 mmol/L — ABNORMAL HIGH (ref 101–111)
Creatinine, Ser: 2.74 mg/dL — ABNORMAL HIGH (ref 0.44–1.00)
GFR calc Af Amer: 18 mL/min — ABNORMAL LOW (ref >60)
GFR, EST NON AFRICAN AMERICAN: 16 mL/min — AB (ref >60)
Glucose, Bld: 107 mg/dL — ABNORMAL HIGH (ref 65–99)
POTASSIUM: 3.9 mmol/L (ref 3.5–5.1)
Sodium: 143 mmol/L (ref 135–145)
Total Bilirubin: 1.7 mg/dL — ABNORMAL HIGH (ref 0.3–1.2)
Total Protein: 4.6 g/dL — ABNORMAL LOW (ref 6.5–8.1)

## 2014-09-05 LAB — CBC
HCT: 25.6 % — ABNORMAL LOW (ref 36.0–46.0)
HEMOGLOBIN: 8.5 g/dL — AB (ref 12.0–15.0)
MCH: 32 pg (ref 26.0–34.0)
MCHC: 33.2 g/dL (ref 30.0–36.0)
MCV: 96.2 fL (ref 78.0–100.0)
Platelets: 177 10*3/uL (ref 150–400)
RBC: 2.66 MIL/uL — ABNORMAL LOW (ref 3.87–5.11)
RDW: 19.3 % — ABNORMAL HIGH (ref 11.5–15.5)
WBC: 14.7 10*3/uL — AB (ref 4.0–10.5)

## 2014-09-05 MED ORDER — ISOSORBIDE MONONITRATE ER 30 MG PO TB24
30.0000 mg | ORAL_TABLET | Freq: Every day | ORAL | Status: DC
  Administered 2014-09-06 – 2014-09-08 (×3): 30 mg via ORAL
  Filled 2014-09-05 (×4): qty 1

## 2014-09-05 NOTE — Progress Notes (Signed)
Progress Note for [facility]  Subjective: No acute events.  No reports of any melena.  Objective: Vital signs in last 24 hours: Temp:  [98.1 F (36.7 C)-99.2 F (37.3 C)] 98.6 F (37 C) (05/21 0443) Pulse Rate:  [71-94] 71 (05/21 0600) Resp:  [13-23] 14 (05/21 0600) BP: (118-181)/(42-109) 121/54 mmHg (05/21 0600) SpO2:  [95 %-100 %] 98 % (05/21 0600) Weight:  [61.1 kg (134 lb 11.2 oz)-62 kg (136 lb 11 oz)] 62 kg (136 lb 11 oz) (05/21 0443) Last BM Date: 09/04/14  Intake/Output from previous day: 05/20 0701 - 05/21 0700 In: 3107.9 [I.V.:2400.4; Blood:707.5] Out: 250 [Urine:250] Intake/Output this shift:    General appearance: alert and no distress GI: soft, non-tender; bowel sounds normal; no masses,  no organomegaly  Lab Results:  Recent Labs  09/04/14 1209 09/04/14 1850 09/05/14 0500  WBC 18.4*  --  14.7*  HGB 6.1* 5.5* 8.5*  HCT 19.3* 17.3* 25.6*  PLT 278  --  177   BMET  Recent Labs  09/04/14 1209 09/05/14 0500  NA 142 143  K 4.3 3.9  CL 112* 117*  CO2 18* 16*  GLUCOSE 109* 107*  BUN 84* 67*  CREATININE 3.06* 2.74*  CALCIUM 8.4* 7.8*   LFT  Recent Labs  09/05/14 0500  PROT 4.6*  ALBUMIN 2.4*  AST 17  ALT 18  ALKPHOS 24*  BILITOT 1.7*   PT/INR  Recent Labs  09/04/14 1209 09/05/14 0500  LABPROT 38.4* 39.2*  INR 4.06* 4.17*   Hepatitis Panel No results for input(s): HEPBSAG, HCVAB, HEPAIGM, HEPBIGM in the last 72 hours. C-Diff No results for input(s): CDIFFTOX in the last 72 hours. Fecal Lactopherrin No results for input(s): FECLLACTOFRN in the last 72 hours.  Studies/Results: No results found.  Medications:  Scheduled: . amLODipine  2.5 mg Oral Q2200  . antiseptic oral rinse  7 mL Mouth Rinse q12n4p  . carvedilol  6.25 mg Oral BID WC  . chlorhexidine  15 mL Mouth Rinse BID  . citalopram  40 mg Oral Daily  . cycloSPORINE modified  125 mg Oral BID  . hydrocortisone  20 mg Oral BID  . isosorbide mononitrate  30 mg Oral Daily   . levothyroxine  100 mcg Oral QAC breakfast  . mycophenolate  500 mg Oral BID  . [START ON 09/08/2014] pantoprazole (PROTONIX) IV  40 mg Intravenous Q12H  . pravastatin  20 mg Oral QHS  . sodium chloride  3 mL Intravenous Q12H  . valGANciclovir  450 mg Oral Q M,W,F   Continuous: . sodium chloride Stopped (09/04/14 2001)  . pantoprozole (PROTONIX) infusion 8 mg/hr (09/04/14 2245)    Assessment/Plan: 1) Melena. 2) Supratherapeutic INR. 3) Epigastric pain. 4) Anemia - Improved with blood transfusion.   The patient is stable.  No further reports of melena, but her INR is still at 4.0.  Her HGB has increased to 8.5 g/dL.    Plan: 1) EGD once her INR is below 1.5. 2) Monitor HGB. 3) Continue with pantoprazole.  LOS: 1 day   Nancy Blair 09/05/2014, 7:30 AM

## 2014-09-05 NOTE — Progress Notes (Signed)
PATIENT DETAILS Name: Nancy Blair Age: 79 y.o. Sex: female Date of Birth: [birth year] Admit Date: 09/04/2014 Admitting Physician Oswald Hillock, MD [id number] [name], MD  Subjective: No further melena since admission. No epigastric pain  Assessment/Plan: Active Problems: Upper GI bleed: Admitted with melanotic stools-this is in a setting of supratherapeutic INR. Continue PPI, transfusing as needed. However bleeding seems to have stopped. GI plans endoscopy when INR close to 1.5.  Acute blood loss anemia: Secondary to above. Transfuse 2 units of PRBC. Hemoglobin stable at 8.5 this morning. Continue to monitor hemoglobin closely and transfuse as needed.  Acute on chronic kidney disease stage IV: Acute renal failure likely secondary to prerenal azotemia from upper GI bleeding. Creatinine now much improved and back to baseline.  Coagulopathy/supratherapeutic INR: Recently started on Coumadin for right lower extremity DVT. Given vitamin K on admission. GI bleeding seems to have resolved or slowed down significantly, patient's hemoglobin stable. Will allow INR to drift down-if GI bleeding reoccurs/worsens-will transfuse FFP/vitamin K.  Recent right lower extremity DVT: On Coumadin prior to admission. Suspect will need a IVC filter-have consulted interventional radiology. Will await EGD results-to determine how long she needs to be off Coumadin.  History of lung transplant: Continue steroids, cyclosporine, CellCept. On valganciclovir Monday Wednesday and Friday.   History of hypothyroidism: Continue levothyroxine  Hypertension: Cautiously continue with Coreg and Imdur, will stop amlodipine. If GI bleeding reoccurs may need to stop.  Disposition: Remain inpatient  Antimicrobial agents  See below  Anti-infectives    Start     Dose/Rate Route Frequency Ordered Stop   09/04/14 1800  valGANciclovir (VALCYTE) 450 MG tablet TABS 450 mg     450 mg Oral Every M-W-F  09/04/14 1755        DVT Prophylaxis: None needed  Code Status:  DNR-reconfirmed with patient this morning.  Family Communication None at bedside  Procedures: None  CONSULTS:  GI  Time spent 40 minutes-Greater than 50% of this time was spent in counseling, explanation of diagnosis, planning of further management, and coordination of care.  MEDICATIONS: Scheduled Meds: . amLODipine  2.5 mg Oral Q2200  . antiseptic oral rinse  7 mL Mouth Rinse q12n4p  . carvedilol  6.25 mg Oral BID WC  . chlorhexidine  15 mL Mouth Rinse BID  . citalopram  40 mg Oral Daily  . cycloSPORINE modified  125 mg Oral BID  . hydrocortisone  20 mg Oral BID  . isosorbide mononitrate  30 mg Oral Daily  . levothyroxine  100 mcg Oral QAC breakfast  . mycophenolate  500 mg Oral BID  . [START ON 09/08/2014] pantoprazole (PROTONIX) IV  40 mg Intravenous Q12H  . pravastatin  20 mg Oral QHS  . sodium chloride  3 mL Intravenous Q12H  . valGANciclovir  450 mg Oral Q M,W,F   Continuous Infusions: . sodium chloride Stopped (09/04/14 2001)  . pantoprozole (PROTONIX) infusion 8 mg/hr (09/05/14 1031)   PRN Meds:.sodium chloride, acetaminophen **OR** acetaminophen, ondansetron **OR** ondansetron (ZOFRAN) IV, sodium chloride    PHYSICAL EXAM: Vital signs in last 24 hours: Filed Vitals:   09/05/14 0600 09/05/14 0700 09/05/14 0800 09/05/14 0900  BP: 121/54 148/59 124/62 134/55  Pulse: 71 73 73 72  Temp:      TempSrc:      Resp: 14 14 22 18   Height:      Weight:      SpO2: 98%  96% 98% 98%    Weight change:  Filed Weights   09/04/14 1800 09/05/14 0443  Weight: 61.1 kg (134 lb 11.2 oz) 62 kg (136 lb 11 oz)   Body mass index is 26.69 kg/(m^2).   Gen Exam: Awake and alert with clear speech.   Neck: Supple, No JVD.   Chest: B/L Clear.   CVS: S1 S2 Regular, no murmurs.  Abdomen: soft, BS +, non tender, non distended.  Extremities: no edema, lower extremities warm to touch. Neurologic: Non Focal.     Skin: No Rash.   Wounds: N/A.    Intake/Output from previous day:  Intake/Output Summary (Last 24 hours) at 09/05/14 1156 Last data filed at 09/05/14 0900  Gross per 24 hour  Intake 3182.92 ml  Output    250 ml  Net 2932.92 ml     LAB RESULTS: CBC  Recent Labs Lab 09/04/14 1209 09/04/14 1850 09/05/14 0500  WBC 18.4*  --  14.7*  HGB 6.1* 5.5* 8.5*  HCT 19.3* 17.3* 25.6*  PLT 278  --  177  MCV 103.2*  --  96.2  MCH 32.6  --  32.0  MCHC 31.6  --  33.2  RDW 17.0*  --  19.3*  LYMPHSABS 1.3  --   --   MONOABS 0.6  --   --   EOSABS 0.0  --   --   BASOSABS 0.0  --   --     Chemistries   Recent Labs Lab 09/04/14 1209 09/05/14 0500  NA 142 143  K 4.3 3.9  CL 112* 117*  CO2 18* 16*  GLUCOSE 109* 107*  BUN 84* 67*  CREATININE 3.06* 2.74*  CALCIUM 8.4* 7.8*    CBG: No results for input(s): GLUCAP in the last 168 hours.  GFR Estimated Creatinine Clearance: 14.4 mL/min (by C-G formula based on Cr of 2.74).  Coagulation profile  Recent Labs Lab 09/04/14 1209 09/05/14 0500  INR 4.06* 4.17*    Cardiac Enzymes No results for input(s): CKMB, TROPONINI, MYOGLOBIN in the last 168 hours.  Invalid input(s): CK  Invalid input(s): POCBNP No results for input(s): DDIMER in the last 72 hours. No results for input(s): HGBA1C in the last 72 hours. No results for input(s): CHOL, HDL, LDLCALC, TRIG, CHOLHDL, LDLDIRECT in the last 72 hours. No results for input(s): TSH, T4TOTAL, T3FREE, THYROIDAB in the last 72 hours.  Invalid input(s): FREET3 No results for input(s): VITAMINB12, FOLATE, FERRITIN, TIBC, IRON, RETICCTPCT in the last 72 hours. No results for input(s): LIPASE, AMYLASE in the last 72 hours.  Urine Studies No results for input(s): UHGB, CRYS in the last 72 hours.  Invalid input(s): UACOL, UAPR, USPG, UPH, UTP, UGL, UKET, UBIL, UNIT, UROB, ULEU, UEPI, UWBC, URBC, UBAC, CAST, UCOM, BILUA  MICROBIOLOGY: Recent Results (from the past 240 hour(s))   MRSA PCR Screening     Status: None   Collection Time: 09/04/14  5:38 PM  Result Value Ref Range Status   MRSA by PCR NEGATIVE NEGATIVE Final    Comment:        The GeneXpert MRSA Assay (FDA approved for NASAL specimens only), is one component of a comprehensive MRSA colonization surveillance program. It is not intended to diagnose MRSA infection nor to guide or monitor treatment for MRSA infections.     RADIOLOGY STUDIES/RESULTS: US Venous Img Lower Unilateral Right  08/24/2014   CLINICAL DATA:  Varicose veins strip from both legs. History of bilateral lung transplant. Right lower leg swelling for 1  week.  EXAM: Right LOWER EXTREMITY VENOUS DOPPLER ULTRASOUND  TECHNIQUE: Gray-scale sonography with graded compression, as well as color Doppler and duplex ultrasound were performed to evaluate the lower extremity deep venous systems from the level of the common femoral vein and including the common femoral, femoral, profunda femoral, popliteal and calf veins including the posterior tibial, peroneal and gastrocnemius veins when visible. The superficial great saphenous vein was also interrogated. Spectral Doppler was utilized to evaluate flow at rest and with distal augmentation maneuvers in the common femoral, femoral and popliteal veins.  COMPARISON:  None.  FINDINGS: There is hypoechoic, expansile thrombus throughout the right deep venous system, extending from the calf veins veins through the femoral vein into the common femoral vein. The majority of the thrombosis is complete/occlusive. Mobile luminal clot present at the level of the common femoral vein. The contralateral common femoral vein has normal respiratory phasicity and no evidence of thrombus.  The patient returned to ordering physician's office to await results in a wheelchair. This study is marked for technologist call report.  IMPRESSION: Diffuse acute right lower extremity deep venous thrombosis with clot from the calf into the  common femoral vein. The majority of the thrombosis is occlusive, consider IR clinic referral for thrombolysis evaluation.   Electronically Signed   By: Monte Fantasia M.D.   On: 08/24/2014 13:29    Oren Binet, MD  [facility] Pager:[phone]  If 7PM-7AM, please contact night-coverage [email or web address] Password [email or web address] 09/05/2014, 11:56 AM   LOS: 1 day

## 2014-09-05 NOTE — Clinical Social Work Note (Signed)
Clinical Social Work Assessment  Patient Details  Name: Nancy Blair MRN: [id number] Date of Birth: [birth year]  Date of referral:  09/05/14               Reason for consult:  Facility Placement                Permission sought to share information with:  Facility Sport and exercise psychologist, Family Supports Permission granted to share information::  Yes, Verbal Permission Granted  Name::     [place]::  [facility]   Relationship::  adult daughter  Sport and exercise psychologist Information:     Housing/Transportation Living arrangements for the past 2 months:  Editor, commissioning, Charity fundraiser of Information:  Patient, Adult Children Patient Interpreter Needed:  None Criminal Activity/Legal Involvement Pertinent to Current Situation/Hospitalization:  No - Comment as needed Significant Relationships:  Adult Children Lives with:    Do you feel safe going back to the place where you live?  Yes Need for family participation in patient care:  Yes (Comment)  Care giving concerns:  Patient from independent living, may need higher level of care due to increased weakness. Pt family share that pt has become weaker prior to admission and pt was in process of transition to [facility].    Social Worker assessment / plan:  CSW met with pt and pt duaghters at bedside. Pt shares she lives in [place] independent living. Patient shares that she is open to skilled nursing facility placement if needed. Pt daughters interrupted and stated that patient must go to assisted living or skilled nursing as patient has declined gravely yet has been declinning home health services. Patient stated she was open to more help at this time. Patient open to skilled nursing for short term rehab in [place] or going to [facility] if alf is able to manage pt care needs.    Employment status:  Retired Nurse, adult PT Recommendations:  Not assessed  at this time [place] / Referral to community resources:  [place]  Patient/Family's Response to care:  Patient and pt family verbalized openess to higher level of care as recommended by treatment team.   Patient/Family's Understanding of and Emotional Response to Diagnosis, Current Treatment, and Prognosis:  Pt and pt family verbalized understanding of pt current treatment, prognosisis, and disposition process.  Emotional Assessment Appearance:  Appears stated age Attitude/Demeanor/Rapport:   (calm and resting ) Affect (typically observed):  Accepting Orientation:  Oriented to Self, Oriented to Place, Oriented to  Time, Oriented to Situation Alcohol / Substance use:  Not Applicable Psych involvement (Current and /or in the community):  No (Comment)  Discharge Needs  Concerns to be addressed:  Discharge Planning Concerns Readmission within the last 30 days:  No Current discharge risk:  None Barriers to Discharge:  No Barriers Identified   Story Vanvranken, [place], LCSW 09/05/2014, 8:06 PM

## 2014-09-05 NOTE — Clinical Social Work Placement (Signed)
   CLINICAL SOCIAL WORK PLACEMENT  NOTE  Date:  09/05/2014  Patient Details  Name: Nancy Blair MRN: [id number] Date of Birth: [birth year]  Clinical Social Work is seeking post-discharge placement for this patient at the   level of care (*CSW will initial, date and re-position this form in  chart as items are completed):      Patient/family provided with [facility] Work Department's list of facilities offering this level of care within the geographic area requested by the patient (or if unable, by the patient's family).      Patient/family informed of their freedom to choose among providers that offer the needed level of care, that participate in Medicare, Medicaid or managed care program needed by the patient, have an available bed and are willing to accept the patient.      Patient/family informed of [facility]'s ownership interest in [facility] and [facility], as well as of the fact that they are under no obligation to receive care at these facilities.  PASRR submitted to EDS on       PASRR number received on       Existing PASRR number confirmed on       FL2 transmitted to all facilities in geographic area requested by pt/family on       FL2 transmitted to all facilities within larger geographic area on       Patient informed that his/her managed care company has contracts with or will negotiate with certain facilities, including the following:            Patient/family informed of bed offers received.  Patient chooses bed at       Physician recommends and patient chooses bed at      Patient to be transferred to   on  .  Patient to be transferred to facility by       Patient family notified on   of transfer.  Name of family member notified:        PHYSICIAN       Additional Comment:    _______________________________________________ Edson Snowball, LCSW 09/05/2014, 8:09 PM

## 2014-09-05 NOTE — Consult Note (Signed)
Chief Complaint: Chief Complaint  Patient presents with  . Rectal Bleeding  . Emesis  . Diarrhea  . Nausea  RLE DVT On coumadin---now with bloody stools  Referring Physician(s): [facility]  History of Present Illness: Nancy Blair is a 79 y.o. female   Pt was dx with Rt extensive DVT at [facility] Dr [name] 08/24/14 Was started on heparin then home on coumadin Developed bloody stools just yesterday Supra therapeutic INR 4.17 5/20 Now off coumadin Plan for GI work up when INR lower  OP doppler does reveal extensive RLE DVT; common fem vein; saphenofem jxn; popliteal with minimal movement Report states to consider thrombolysis  Now request for possible Retrievable Inferior vena cava filter placement per [facility] I have seen and examined pt Discussed with Dr Vangie Bicker procedure when INR more normal I have discussed with pt and family at length---they want to talk with MD  Hx COPD- B lung transplant CKD  Past Medical History  Diagnosis Date  . Emphysema of lung   . Hypertension   . Hyperlipidemia   . Allergy   . Anemia   . GERD (gastroesophageal reflux disease)   . Adrenal insufficiency   . Squamous acanthoma of skin 06/2011    rt leg  . Hypothyroidism   . CKD (chronic kidney disease) stage 4, GFR 15-29 ml/min     since 2012 (per [facility] records)  . Lung transplant status, bilateral   . Acute on chronic diastolic heart failure   . Ejection fraction   . Diabetes mellitus without complication   . DVT (deep venous thrombosis) 08/2014    RT LEG    Past Surgical History  Procedure Laterality Date  . Appendectomy    . Abdominal hysterectomy    . Tonsillectomy    . Total lung replacement  2012    b/l lung transplant  . Esophagogastroduodenoscopy  05/26/2011    Procedure: ESOPHAGOGASTRODUODENOSCOPY (EGD);  Surgeon: Owens Loffler, MD;  Location: [facility];  Service: Endoscopy;  Laterality: N/A;  . Nissen fundoplication      Allergies: Grapefruit extract; Oysters;  Pineapple; Bupropion; Neomycin-bacitracin zn-polymyx; Penicillins; and Prednisone  Medications: Prior to Admission medications   Medication Sig Start Date End Date Taking? Authorizing Provider  amLODipine (NORVASC) 2.5 MG tablet Take 1 tablet (2.5 mg total) by mouth daily. Patient taking differently: Take 2.5 mg by mouth daily at 10 pm.  05/25/14  Yes Midge Minium, MD  aspirin 81 MG chewable tablet Chew 81 mg by mouth daily.   Yes Historical Provider, MD  carvedilol (COREG) 6.25 MG tablet Take 1 tablet (6.25 mg total) by mouth 2 (two) times daily with a meal. 02/07/12  Yes Bonnielee Haff, MD  citalopram (CELEXA) 40 MG tablet TAKE 1 TABLET BY MOUTH DAILY Patient taking differently: TAKE 1 TABLET BY MOUTH DAILY AT BEDTIME 06/15/14  Yes Midge Minium, MD  cycloSPORINE modified (NEORAL) 25 MG capsule Take 125 mg by mouth 2 (two) times daily.    Yes Historical Provider, MD  diphenoxylate-atropine (LOMOTIL) 2.5-0.025 MG per tablet Take 1-2 tablets by mouth See admin instructions. Take 2 tablets daily at lunch (2pm), may take 1 more tablet later in the day as needed for diarrhea   Yes Historical Provider, MD  fenofibrate 160 MG tablet TAKE ONE TABLET BY MOUTH EVERY DAY Patient taking differently: TAKE ONE TABLET BY MOUTH DAILY AT BEDTIME 08/14/14  Yes Midge Minium, MD  ferrous sulfate 325 (65 FE) MG tablet Take 325 mg by mouth daily.  Yes Historical Provider, MD  hydrocortisone (CORTEF) 20 MG tablet Take 20 mg by mouth 2 (two) times daily.  10/27/11  Yes Historical Provider, MD  isosorbide mononitrate (IMDUR) 30 MG 24 hr tablet Take 1 tablet (30 mg total) by mouth daily. 08/28/14  Yes Ripudeep Krystal Eaton, MD  levothyroxine (SYNTHROID, LEVOTHROID) 88 MCG tablet Take 88 mcg by mouth daily before breakfast.   Yes Historical Provider, MD  Multiple Vitamin (MULITIVITAMIN WITH MINERALS) TABS Take 1 tablet by mouth daily.   Yes Historical Provider, MD  mycophenolate (CELLCEPT) 500 MG tablet Take 500 mg  by mouth 2 (two) times daily.  12/21/11  Yes Historical Provider, MD  potassium chloride SA (K-DUR,KLOR-CON) 20 MEQ tablet Take 1 tablet (20 mEq total) by mouth daily. 09/05/13  Yes Midge Minium, MD  pravastatin (PRAVACHOL) 20 MG tablet TAKE 1 TABLET BY MOUTH EVERY NIGHT AT BEDTIME 04/08/14  Yes Midge Minium, MD  sulfamethoxazole-trimethoprim (BACTRIM DS,SEPTRA DS) 800-160 MG per tablet Take 1 tablet by mouth every Monday, Wednesday, and Friday.   Yes Historical Provider, MD  valGANciclovir (VALCYTE) 450 MG tablet Take 450 mg by mouth every Monday, Wednesday, and Friday.    Yes Historical Provider, MD  warfarin (COUMADIN) 2 MG tablet Take 1 tablet (2 mg total) by mouth daily at 6 PM. Patient taking differently: Take 0.5 mg by mouth daily.  08/29/14  Yes Ripudeep K Rai, MD  enoxaparin (LOVENOX) 60 MG/0.6ML injection Inject 0.6 mLs (60 mg total) into the skin daily. Stop when INR is above 2 Patient not taking: Reported on 09/04/2014 08/29/14   Ripudeep Krystal Eaton, MD  furosemide (LASIX) 20 MG tablet Take 1 tablet (20 mg total) by mouth every other day. Takes on Monday, Wednesday, and Friday Patient not taking: Reported on 09/04/2014 10/23/13   Midge Minium, MD  levothyroxine (SYNTHROID, LEVOTHROID) 100 MCG tablet Take 1 tablet (100 mcg total) by mouth daily. Patient not taking: Reported on 09/04/2014 05/20/14   Midge Minium, MD     Family History  Problem Relation Age of Onset  . Heart disease Mother   . Heart disease Father   . Pancreatic cancer Sister   . Colon cancer Neg Hx   . Malignant hyperthermia Neg Hx   . Hypertension Mother   . Hypertension Father     History   Social History  . Marital Status: Married    Spouse Name: N/A  . Number of Children: N/A  . Years of Education: N/A   Occupational History  . retired     Teacher, adult education   Social History Main Topics  . Smoking status: Former Smoker -- 3.00 packs/day    Start date: 04/17/1952    Quit date: 07/16/1993  .  Smokeless tobacco: Never Used     Comment: quit 25 years ago  . Alcohol Use: No  . Drug Use: No  . Sexual Activity: No   Other Topics Concern  . None   Social History Narrative   Lives with husband   Is a DNAR   Daughter who is an Rn [phone]    Review of Systems: A 12 point ROS discussed and pertinent positives are indicated in the HPI above.  All other systems are negative.  Review of Systems  Constitutional: Positive for activity change. Negative for fever.  Respiratory: Positive for chest tightness.   Cardiovascular: Positive for chest pain.  Gastrointestinal: Positive for abdominal pain and blood in stool.  Musculoskeletal: Negative for back pain.  Neurological: Positive  for weakness.  Psychiatric/Behavioral: Negative for behavioral problems and confusion.    Vital Signs: BP 134/55 mmHg  Pulse 72  Temp(Src) 98.6 F (37 C) (Oral)  Resp 18  Ht 5' (1.524 m)  Wt 62 kg (136 lb 11 oz)  BMI 26.69 kg/m2  SpO2 98%  Physical Exam  Constitutional: She is oriented to person, place, and time. She appears well-nourished.  Cardiovascular: Normal rate and regular rhythm.   No murmur heard. Pulmonary/Chest: Effort normal and breath sounds normal. She has no wheezes.  Abdominal: Soft. Bowel sounds are normal. There is no tenderness.  Musculoskeletal: Normal range of motion.  Neurological: She is alert and oriented to person, place, and time.  Skin: Skin is warm and dry.  Psychiatric: She has a normal mood and affect. Judgment and thought content normal.  Nursing note and vitals reviewed.   Mallampati Score:  MD Evaluation Airway: WNL Heart: WNL Abdomen: WNL Chest/ Lungs: WNL ASA  Classification: 3 Mallampati/Airway Score: One  Imaging: US Venous Img Lower Unilateral Right  08/24/2014   CLINICAL DATA:  Varicose veins strip from both legs. History of bilateral lung transplant. Right lower leg swelling for 1 week.  EXAM: Right LOWER EXTREMITY VENOUS DOPPLER  ULTRASOUND  TECHNIQUE: Gray-scale sonography with graded compression, as well as color Doppler and duplex ultrasound were performed to evaluate the lower extremity deep venous systems from the level of the common femoral vein and including the common femoral, femoral, profunda femoral, popliteal and calf veins including the posterior tibial, peroneal and gastrocnemius veins when visible. The superficial great saphenous vein was also interrogated. Spectral Doppler was utilized to evaluate flow at rest and with distal augmentation maneuvers in the common femoral, femoral and popliteal veins.  COMPARISON:  None.  FINDINGS: There is hypoechoic, expansile thrombus throughout the right deep venous system, extending from the calf veins veins through the femoral vein into the common femoral vein. The majority of the thrombosis is complete/occlusive. Mobile luminal clot present at the level of the common femoral vein. The contralateral common femoral vein has normal respiratory phasicity and no evidence of thrombus.  The patient returned to ordering physician's office to await results in a wheelchair. This study is marked for technologist call report.  IMPRESSION: Diffuse acute right lower extremity deep venous thrombosis with clot from the calf into the common femoral vein. The majority of the thrombosis is occlusive, consider IR clinic referral for thrombolysis evaluation.   Electronically Signed   By: Monte Fantasia M.D.   On: 08/24/2014 13:29    Labs:  CBC:  Recent Labs  08/26/14 0349 08/27/14 0338 09/04/14 1209 09/04/14 1850 09/05/14 0500 09/05/14 1149  WBC 7.2 8.5 18.4*  --  14.7*  --   HGB 8.3* 8.1* 6.1* 5.5* 8.5* 7.7*  HCT 26.0* 25.2* 19.3* 17.3* 25.6* 24.2*  PLT 192 197 278  --  177  --     COAGS:  Recent Labs  08/24/14 1819 08/27/14 0338 08/28/14 0253 09/04/14 1209 09/05/14 0500  INR 1.17 1.13 1.97* 4.06* 4.17*  APTT 40*  --   --   --   --     BMP:  Recent Labs  08/27/14 0338  08/28/14 0253 09/04/14 1209 09/05/14 0500  NA 139 136 142 143  K 3.9 3.3* 4.3 3.9  CL 108 107 112* 117*  CO2 23 20* 18* 16*  GLUCOSE 147* 131* 109* 107*  BUN 47* 46* 84* 67*  CALCIUM 8.1* 7.6* 8.4* 7.8*  CREATININE 3.10* 2.96* 3.06* 2.74*  GFRNONAA 14* 14* 14* 16*  GFRAA 16* 17* 16* 18*    LIVER FUNCTION TESTS:  Recent Labs  04/30/14 0605 08/24/14 1819 08/25/14 0220 09/05/14 0500  BILITOT 0.8 0.9 0.9 1.7*  AST 40* 22 21 17   ALT 38* 19 18 18   ALKPHOS 68 35* 32* 24*  PROT 5.6* 5.3* 4.7* 4.6*  ALBUMIN 2.7* 2.4* 2.4* 2.4*    TUMOR MARKERS: No results for input(s): AFPTM, CEA, CA199, CHROMGRNA in the last 8760 hours.  Assessment and Plan:  RLE DVT Extensive/occlusive-- Placed on coumadin 5/9 Developed bloody stools Now off coumadin Plan for GI work up Request for retrievable IVC filter placement Risks and Benefits discussed with the patient including, but not limited to bleeding, infection, contrast induced renal failure, filter fracture or migration which can lead to emergency surgery or even death, strut penetration with damage or irritation to adjacent structures and caval thrombosis. All of the patient's questions were answered, patient is agreeable to proceed. Consent signed and in chart.  Pt and family still would like to discuss with their MD We will check INR in am Plan ofr 5/22 or 5/23  Thank you for this interesting consult.  I greatly enjoyed meeting Nancy Blair and look forward to participating in their care.  Signed: Rockie Schnoor A 09/05/2014, 1:58 PM   I spent a total of 40 Minutes    in face to face in clinical consultation, greater than 50% of which was counseling/coordinating care for IVC filter

## 2014-09-06 DIAGNOSIS — K922 Gastrointestinal hemorrhage, unspecified: Secondary | ICD-10-CM

## 2014-09-06 LAB — CBC
HEMATOCRIT: 25.1 % — AB (ref 36.0–46.0)
Hemoglobin: 8.2 g/dL — ABNORMAL LOW (ref 12.0–15.0)
MCH: 31.4 pg (ref 26.0–34.0)
MCHC: 32.7 g/dL (ref 30.0–36.0)
MCV: 96.2 fL (ref 78.0–100.0)
Platelets: 192 10*3/uL (ref 150–400)
RBC: 2.61 MIL/uL — ABNORMAL LOW (ref 3.87–5.11)
RDW: 20.6 % — ABNORMAL HIGH (ref 11.5–15.5)
WBC: 11.4 10*3/uL — ABNORMAL HIGH (ref 4.0–10.5)

## 2014-09-06 LAB — BASIC METABOLIC PANEL
ANION GAP: 8 (ref 5–15)
BUN: 60 mg/dL — ABNORMAL HIGH (ref 6–20)
CALCIUM: 7.5 mg/dL — AB (ref 8.9–10.3)
CO2: 16 mmol/L — AB (ref 22–32)
Chloride: 117 mmol/L — ABNORMAL HIGH (ref 101–111)
Creatinine, Ser: 2.87 mg/dL — ABNORMAL HIGH (ref 0.44–1.00)
GFR calc non Af Amer: 15 mL/min — ABNORMAL LOW (ref >60)
GFR, EST AFRICAN AMERICAN: 17 mL/min — AB (ref >60)
Glucose, Bld: 204 mg/dL — ABNORMAL HIGH (ref 65–99)
Potassium: 3.9 mmol/L (ref 3.5–5.1)
Sodium: 141 mmol/L (ref 135–145)

## 2014-09-06 LAB — PROTIME-INR
INR: 6.64 (ref 0.00–1.49)
Prothrombin Time: 55.6 seconds — ABNORMAL HIGH (ref 11.6–15.2)

## 2014-09-06 MED ORDER — CETYLPYRIDINIUM CHLORIDE 0.05 % MT LIQD
7.0000 mL | Freq: Two times a day (BID) | OROMUCOSAL | Status: DC
  Administered 2014-09-07 – 2014-09-08 (×2): 7 mL via OROMUCOSAL

## 2014-09-06 MED ORDER — AMLODIPINE BESYLATE 5 MG PO TABS
5.0000 mg | ORAL_TABLET | Freq: Every day | ORAL | Status: DC
  Administered 2014-09-06 – 2014-09-07 (×2): 5 mg via ORAL
  Filled 2014-09-06 (×3): qty 1

## 2014-09-06 MED ORDER — PHYTONADIONE 5 MG PO TABS
5.0000 mg | ORAL_TABLET | Freq: Once | ORAL | Status: AC
  Administered 2014-09-06: 5 mg via ORAL
  Filled 2014-09-06: qty 1

## 2014-09-06 MED ORDER — HYDRALAZINE HCL 20 MG/ML IJ SOLN
10.0000 mg | Freq: Four times a day (QID) | INTRAMUSCULAR | Status: DC | PRN
  Administered 2014-09-08: 10 mg via INTRAVENOUS
  Filled 2014-09-06: qty 1

## 2014-09-06 NOTE — Progress Notes (Signed)
Patient ID: Nancy Blair, female   DOB: Dec 31, 1935, 79 y.o.   MRN: [id number]   INR 6.6 today Still HOLD IVC filter  Pt has been seen for IR Poss retrievable IVC filter 5/23 Consent in chart  Will check INR in am

## 2014-09-06 NOTE — Progress Notes (Signed)
Subjective: No complaints.  Feeling well at this time.  No overt evidence of bleeding, but daughter thinks she of had some last evening in her stool.  Objective: Vital signs in last 24 hours: Temp:  [97.4 F (36.3 C)-97.9 F (36.6 C)] 97.4 F (36.3 C) (05/22 0800) Pulse Rate:  [66-78] 66 (05/22 0400) Resp:  [10-21] 10 (05/22 0400) BP: (90-150)/(36-66) 132/53 mmHg (05/22 0400) SpO2:  [96 %-99 %] 96 % (05/22 0400) Last BM Date: 09/05/14  Intake/Output from previous day: 05/21 0701 - 05/22 0700 In: 670 [I.V.:670] Out: 150 [Urine:150] Intake/Output this shift:    General appearance: alert and no distress GI: soft, non-tender; bowel sounds normal; no masses,  no organomegaly  Lab Results:  Recent Labs  09/04/14 1209  09/05/14 0500  09/05/14 1815 09/05/14 2244 09/06/14 0515  WBC 18.4*  --  14.7*  --   --   --  11.4*  HGB 6.1*  < > 8.5*  < > 7.9* 7.8* 8.2*  HCT 19.3*  < > 25.6*  < > 24.0* 23.3* 25.1*  PLT 278  --  177  --   --   --  192  < > = values in this interval not displayed. BMET  Recent Labs  09/04/14 1209 09/05/14 0500 09/06/14 0515  NA 142 143 141  K 4.3 3.9 3.9  CL 112* 117* 117*  CO2 18* 16* 16*  GLUCOSE 109* 107* 204*  BUN 84* 67* 60*  CREATININE 3.06* 2.74* 2.87*  CALCIUM 8.4* 7.8* 7.5*   LFT  Recent Labs  09/05/14 0500  PROT 4.6*  ALBUMIN 2.4*  AST 17  ALT 18  ALKPHOS 24*  BILITOT 1.7*   PT/INR  Recent Labs  09/05/14 0500 09/06/14 0515  LABPROT 39.2* 55.6*  INR 4.17* 6.64*   Hepatitis Panel No results for input(s): HEPBSAG, HCVAB, HEPAIGM, HEPBIGM in the last 72 hours. C-Diff No results for input(s): CDIFFTOX in the last 72 hours. Fecal Lactopherrin No results for input(s): FECLLACTOFRN in the last 72 hours.  Studies/Results: No results found.  Medications:  Scheduled: . amLODipine  5 mg Oral Q2200  . antiseptic oral rinse  7 mL Mouth Rinse q12n4p  . carvedilol  6.25 mg Oral BID WC  . chlorhexidine  15 mL Mouth Rinse  BID  . citalopram  40 mg Oral Daily  . cycloSPORINE modified  125 mg Oral BID  . hydrocortisone  20 mg Oral BID  . isosorbide mononitrate  30 mg Oral Daily  . levothyroxine  100 mcg Oral QAC breakfast  . mycophenolate  500 mg Oral BID  . [START ON 09/08/2014] pantoprazole (PROTONIX) IV  40 mg Intravenous Q12H  . phytonadione  5 mg Oral Once  . pravastatin  20 mg Oral QHS  . sodium chloride  3 mL Intravenous Q12H  . valGANciclovir  450 mg Oral Q M,W,F   Continuous: . sodium chloride Stopped (09/04/14 2001)  . pantoprozole (PROTONIX) infusion 8 mg/hr (09/06/14 4008)    Assessment/Plan: 1) Anemia. 2) Melena. 3) Supratherapeutic INR.   INR has increased from 4 to 6.6.  HGB is at 8.2.  No blood in the bowel movement this AM upon my arrival.    Plan: 1) Continue with Pantoprazole. 2) Hold on EGD until INR corrected. 3) Follow HGB.  LOS: 2 days   Evan Osburn D 09/06/2014, 8:53 AM

## 2014-09-06 NOTE — Progress Notes (Signed)
PATIENT DETAILS Name: Nancy Blair Age: 79 y.o. Sex: female Date of Birth: [birth year] Admit Date: 09/04/2014 Admitting Physician Oswald Hillock, MD ALP:[id number] [name], MD  Subjective: Doing well-per patient no melena-but per RN could have some maroon colored stools last night  Assessment/Plan: Active Problems: Upper GI bleed: Admitted with melanotic stools-this is in a setting of supratherapeutic INR. Continue PPI. Hb stable. GI plans endoscopy when INR close to 1.5.Bleeding seems to have either stopped or slowed down significantly  Acute blood loss anemia: Secondary to above. Transfused 2 units of PRBC. Hemoglobin stable at 825 this morning. Continue to monitor hemoglobin closely and transfuse as needed.  Acute on chronic kidney disease stage IV: Acute renal failure likely secondary to prerenal azotemia from upper GI bleeding. Creatinine now much improved and back to baseline.  Coagulopathy/supratherapeutic INR: Recently started on Coumadin for right lower extremity DVT. Given vitamin K on admission-however INR back up to 6.64 today-will give another dose of Vit K.Follow INR  Recent right lower extremity DVT: On Coumadin prior to admission. Suspect will need a IVC filter-have consulted interventional radiology. Spoke with daughter (a vascular RN) at length on 5/21.Will await EGD results-to determine how long she needs to be off Coumadin.  History of lung transplant: Continue steroids, cyclosporine, CellCept. On valganciclovir Monday Wednesday and Friday.   History of hypothyroidism: Continue levothyroxine  Hypertension: Cautiously continue with Coreg and Imdur, and amlodipine. If GI bleeding reoccurs may need to stop/minimize  Palliative care: Long discussion with 2 daughters at bedside on 5/21. The realize that patient has a lot of chronic issues, and very clear on the goals of care. Patient is a DO NOT RESUSCITATE. They have already discussed with the nephrologist  and they do not desire hemodialysis if and when creatinine worsens further. They do not desire aggressive care, but okay with pursuing an IVC filter and EGD. Challenging as to how to manage anticoagulation in the long-term setting, per daughter-patient is very "sensitive" to Coumadin and even with minimal dosing gets supratherapeutic. For now will await results of EGD, patient will undergo IVC filter placement-we can then think about whether or not to continue Coumadin or? Start Eliquis or withhold anticoagulation altogether.  Disposition: Remain inpatient-transfer to telemetry  Antimicrobial agents  See below  Anti-infectives    Start     Dose/Rate Route Frequency Ordered Stop   09/04/14 1800  valGANciclovir (VALCYTE) 450 MG tablet TABS 450 mg     450 mg Oral Every M-W-F 09/04/14 1755        DVT Prophylaxis: None needed  Code Status:  DNR  Family Communication None at bedside this am  Procedures: None  CONSULTS:  GI  Time spent 35 minutes-Greater than 50% of this time was spent in counseling, explanation of diagnosis, planning of further management, and coordination of care.  MEDICATIONS: Scheduled Meds: . amLODipine  5 mg Oral Q2200  . antiseptic oral rinse  7 mL Mouth Rinse q12n4p  . carvedilol  6.25 mg Oral BID WC  . chlorhexidine  15 mL Mouth Rinse BID  . citalopram  40 mg Oral Daily  . cycloSPORINE modified  125 mg Oral BID  . hydrocortisone  20 mg Oral BID  . isosorbide mononitrate  30 mg Oral Daily  . levothyroxine  100 mcg Oral QAC breakfast  . mycophenolate  500 mg Oral BID  . [START ON 09/08/2014] pantoprazole (PROTONIX) IV  40 mg  Intravenous Q12H  . pravastatin  20 mg Oral QHS  . sodium chloride  3 mL Intravenous Q12H  . valGANciclovir  450 mg Oral Q M,W,F   Continuous Infusions: . sodium chloride Stopped (09/04/14 2001)  . pantoprozole (PROTONIX) infusion 8 mg/hr (09/06/14 0637)   PRN Meds:.sodium chloride, acetaminophen **OR** acetaminophen,  hydrALAZINE, ondansetron **OR** ondansetron (ZOFRAN) IV, sodium chloride    PHYSICAL EXAM: Vital signs in last 24 hours: Filed Vitals:   09/06/14 0000 09/06/14 0200 09/06/14 0400 09/06/14 0800  BP:  150/66 132/53   Pulse:  66 66   Temp: 97.6 F (36.4 C)  97.5 F (36.4 C) 97.4 F (36.3 C)  TempSrc: Oral  Oral Oral  Resp:  12 10   Height:      Weight:      SpO2:  98% 96%     Weight change:  Filed Weights   09/04/14 1800 09/05/14 0443  Weight: 61.1 kg (134 lb 11.2 oz) 62 kg (136 lb 11 oz)   Body mass index is 26.69 kg/(m^2).   Gen Exam: Awake and alert with clear speech.   Neck: Supple, No JVD.   Chest: B/L Clear.  No rales or rhonchi CVS: S1 S2 Regular, no murmurs.  Abdomen: soft, BS +, non tender, non distended.  Extremities: no edema, lower extremities warm to touch. Neurologic: Non Focal.   Skin: No Rash.   Wounds: N/A.    Intake/Output from previous day:  Intake/Output Summary (Last 24 hours) at 09/06/14 1113 Last data filed at 09/06/14 0500  Gross per 24 hour  Intake    570 ml  Output      0 ml  Net    570 ml     LAB RESULTS: CBC  Recent Labs Lab 09/04/14 1209  09/05/14 0500 09/05/14 1149 09/05/14 1815 09/05/14 2244 09/06/14 0515  WBC 18.4*  --  14.7*  --   --   --  11.4*  HGB 6.1*  < > 8.5* 7.7* 7.9* 7.8* 8.2*  HCT 19.3*  < > 25.6* 24.2* 24.0* 23.3* 25.1*  PLT 278  --  177  --   --   --  192  MCV 103.2*  --  96.2  --   --   --  96.2  MCH 32.6  --  32.0  --   --   --  31.4  MCHC 31.6  --  33.2  --   --   --  32.7  RDW 17.0*  --  19.3*  --   --   --  20.6*  LYMPHSABS 1.3  --   --   --   --   --   --   MONOABS 0.6  --   --   --   --   --   --   EOSABS 0.0  --   --   --   --   --   --   BASOSABS 0.0  --   --   --   --   --   --   < > = values in this interval not displayed.  Chemistries   Recent Labs Lab 09/04/14 1209 09/05/14 0500 09/06/14 0515  NA 142 143 141  K 4.3 3.9 3.9  CL 112* 117* 117*  CO2 18* 16* 16*  GLUCOSE 109* 107*  204*  BUN 84* 67* 60*  CREATININE 3.06* 2.74* 2.87*  CALCIUM 8.4* 7.8* 7.5*    CBG: No results for input(s): GLUCAP in the last  168 hours.  GFR Estimated Creatinine Clearance: 13.7 mL/min (by C-G formula based on Cr of 2.87).  Coagulation profile  Recent Labs Lab 09/04/14 1209 09/05/14 0500 09/06/14 0515  INR 4.06* 4.17* 6.64*    Cardiac Enzymes No results for input(s): CKMB, TROPONINI, MYOGLOBIN in the last 168 hours.  Invalid input(s): CK  Invalid input(s): POCBNP No results for input(s): DDIMER in the last 72 hours. No results for input(s): HGBA1C in the last 72 hours. No results for input(s): CHOL, HDL, LDLCALC, TRIG, CHOLHDL, LDLDIRECT in the last 72 hours. No results for input(s): TSH, T4TOTAL, T3FREE, THYROIDAB in the last 72 hours.  Invalid input(s): FREET3 No results for input(s): VITAMINB12, FOLATE, FERRITIN, TIBC, IRON, RETICCTPCT in the last 72 hours. No results for input(s): LIPASE, AMYLASE in the last 72 hours.  Urine Studies No results for input(s): UHGB, CRYS in the last 72 hours.  Invalid input(s): UACOL, UAPR, USPG, UPH, UTP, UGL, UKET, UBIL, UNIT, UROB, ULEU, UEPI, UWBC, URBC, UBAC, CAST, UCOM, BILUA  MICROBIOLOGY: Recent Results (from the past 240 hour(s))  MRSA PCR Screening     Status: None   Collection Time: 09/04/14  5:38 PM  Result Value Ref Range Status   MRSA by PCR NEGATIVE NEGATIVE Final    Comment:        The GeneXpert MRSA Assay (FDA approved for NASAL specimens only), is one component of a comprehensive MRSA colonization surveillance program. It is not intended to diagnose MRSA infection nor to guide or monitor treatment for MRSA infections.     RADIOLOGY STUDIES/RESULTS: US Venous Img Lower Unilateral Right  08/24/2014   CLINICAL DATA:  Varicose veins strip from both legs. History of bilateral lung transplant. Right lower leg swelling for 1 week.  EXAM: Right LOWER EXTREMITY VENOUS DOPPLER ULTRASOUND  TECHNIQUE: Gray-scale  sonography with graded compression, as well as color Doppler and duplex ultrasound were performed to evaluate the lower extremity deep venous systems from the level of the common femoral vein and including the common femoral, femoral, profunda femoral, popliteal and calf veins including the posterior tibial, peroneal and gastrocnemius veins when visible. The superficial great saphenous vein was also interrogated. Spectral Doppler was utilized to evaluate flow at rest and with distal augmentation maneuvers in the common femoral, femoral and popliteal veins.  COMPARISON:  None.  FINDINGS: There is hypoechoic, expansile thrombus throughout the right deep venous system, extending from the calf veins veins through the femoral vein into the common femoral vein. The majority of the thrombosis is complete/occlusive. Mobile luminal clot present at the level of the common femoral vein. The contralateral common femoral vein has normal respiratory phasicity and no evidence of thrombus.  The patient returned to ordering physician's office to await results in a wheelchair. This study is marked for technologist call report.  IMPRESSION: Diffuse acute right lower extremity deep venous thrombosis with clot from the calf into the common femoral vein. The majority of the thrombosis is occlusive, consider IR clinic referral for thrombolysis evaluation.   Electronically Signed   By: Monte Fantasia M.D.   On: 08/24/2014 13:29    Oren Binet, MD  [facility] Pager:[phone]  If 7PM-7AM, please contact night-coverage [email or web address] Password [email or web address] 09/06/2014, 11:13 AM   LOS: 2 days

## 2014-09-07 ENCOUNTER — Inpatient Hospital Stay (HOSPITAL_COMMUNITY): Payer: Medicare Other

## 2014-09-07 DIAGNOSIS — D649 Anemia, unspecified: Secondary | ICD-10-CM

## 2014-09-07 DIAGNOSIS — A0472 Enterocolitis due to Clostridium difficile, not specified as recurrent: Secondary | ICD-10-CM | POA: Insufficient documentation

## 2014-09-07 LAB — PROTIME-INR
INR: 3.6 — AB (ref 0.00–1.49)
PROTHROMBIN TIME: 35.1 s — AB (ref 11.6–15.2)

## 2014-09-07 LAB — BASIC METABOLIC PANEL
Anion gap: 6 (ref 5–15)
BUN: 50 mg/dL — ABNORMAL HIGH (ref 6–20)
CHLORIDE: 119 mmol/L — AB (ref 101–111)
CO2: 18 mmol/L — AB (ref 22–32)
CREATININE: 2.91 mg/dL — AB (ref 0.44–1.00)
Calcium: 7.6 mg/dL — ABNORMAL LOW (ref 8.9–10.3)
GFR calc non Af Amer: 15 mL/min — ABNORMAL LOW (ref >60)
GFR, EST AFRICAN AMERICAN: 17 mL/min — AB (ref >60)
Glucose, Bld: 175 mg/dL — ABNORMAL HIGH (ref 65–99)
Potassium: 3.8 mmol/L (ref 3.5–5.1)
Sodium: 143 mmol/L (ref 135–145)

## 2014-09-07 LAB — CBC
HEMATOCRIT: 24.5 % — AB (ref 36.0–46.0)
Hemoglobin: 8 g/dL — ABNORMAL LOW (ref 12.0–15.0)
MCH: 31.6 pg (ref 26.0–34.0)
MCHC: 32.7 g/dL (ref 30.0–36.0)
MCV: 96.8 fL (ref 78.0–100.0)
Platelets: 214 10*3/uL (ref 150–400)
RBC: 2.53 MIL/uL — ABNORMAL LOW (ref 3.87–5.11)
RDW: 20.2 % — ABNORMAL HIGH (ref 11.5–15.5)
WBC: 8.2 10*3/uL (ref 4.0–10.5)

## 2014-09-07 LAB — CLOSTRIDIUM DIFFICILE BY PCR: CDIFFPCR: POSITIVE — AB

## 2014-09-07 MED ORDER — VANCOMYCIN 50 MG/ML ORAL SOLUTION
125.0000 mg | Freq: Four times a day (QID) | ORAL | Status: DC
  Administered 2014-09-07 – 2014-09-08 (×7): 125 mg via ORAL
  Filled 2014-09-07 (×12): qty 2.5

## 2014-09-07 MED ORDER — PHYTONADIONE 5 MG PO TABS
2.5000 mg | ORAL_TABLET | Freq: Once | ORAL | Status: AC
  Administered 2014-09-07: 2.5 mg via ORAL
  Filled 2014-09-07: qty 1

## 2014-09-07 MED ORDER — FUROSEMIDE 80 MG PO TABS
80.0000 mg | ORAL_TABLET | Freq: Once | ORAL | Status: AC
  Administered 2014-09-07: 80 mg via ORAL
  Filled 2014-09-07: qty 1

## 2014-09-07 MED ORDER — FENTANYL CITRATE (PF) 100 MCG/2ML IJ SOLN
INTRAMUSCULAR | Status: AC
  Filled 2014-09-07: qty 2

## 2014-09-07 MED ORDER — MIDAZOLAM HCL 2 MG/2ML IJ SOLN
INTRAMUSCULAR | Status: AC
  Filled 2014-09-07: qty 4

## 2014-09-07 MED ORDER — LIDOCAINE HCL 1 % IJ SOLN
INTRAMUSCULAR | Status: AC
  Filled 2014-09-07: qty 20

## 2014-09-07 MED ORDER — FENTANYL CITRATE (PF) 100 MCG/2ML IJ SOLN
INTRAMUSCULAR | Status: AC | PRN
  Administered 2014-09-07: 25 ug via INTRAVENOUS

## 2014-09-07 MED ORDER — MIDAZOLAM HCL 2 MG/2ML IJ SOLN
INTRAMUSCULAR | Status: AC | PRN
  Administered 2014-09-07: 1 mg via INTRAVENOUS

## 2014-09-07 NOTE — Progress Notes (Signed)
PATIENT DETAILS Name: Nancy Blair Age: 79 y.o. Sex: female Date of Birth: 04/19/1935 Admit Date: 09/04/2014 Admitting Physician Oswald Hillock, MD UUE:[id number] Nancy Riddle, MD  Subjective: Head and diarrhea last night.  Assessment/Plan: Active Problems: Upper GI bleed: Admitted with melanotic stools-this is in a setting of supratherapeutic INR. Continue PPI. Hb stable. GI plans endoscopy when INR close to 1.5.Bleeding seems to have either stopped or slowed down significantly.  Acute blood loss anemia: Secondary to above. Transfused 2 units of PRBC. Hemoglobin stable this morning. Continue to monitor hemoglobin closely and transfuse as needed.  Acute on chronic kidney disease stage IV: Acute renal failure likely secondary to prerenal azotemia from upper GI bleeding. Creatinine now much improved and back to baseline. We will resume Lasix.  Coagulopathy/supratherapeutic INR: Recently started on Coumadin for right lower extremity DVT. INR continues to be elevated even though patient has received 2 doses of vitamin K since admission, and has been off Coumadin for the past 4 days.? Vitamin K deficiency. Will give another dose of vitamin K today and recheck INR tomorrow.   C. difficile colitis: Immunosuppressed, on Bactrim chronically-start vancomycin. Follow.  Recent right lower extremity DVT: On Coumadin prior to admission. Suspect will need a IVC filter-have consulted interventional radiology.  Spoke with daughter (a vascular RN) at length on 5/21.Apparently patient has had exaggerated responses with elevated INR even with very low doses of Coumadin. Very difficult situation, ideally given significant clot burden does need anticoagulation-however suspect that she needs an agent that has a more predictable level then Coumadin. I have proposed that we use low-dose Eliquis when we are able to resume anticoagulation. I have asked pharmacy to see if we can discuss/counsel patient and  daughter. For now and in the immediate future, we will need continue to hold off on starting any form of anticoagulation until we have results of determine when she can resume anticoagulation. Option of placing a filter and discontinuing anticoagulation Nancy Blair she has also been discussed with the daughter. She is contemplating both options.  History of lung transplant: Continue steroids, cyclosporine, CellCept. On valganciclovir Monday Wednesday and Friday.   History of hypothyroidism: Continue levothyroxine  Hypertension: Cautiously continue with Coreg and Imdur, and amlodipine. If GI bleeding reoccurs may need to stop/minimize  Palliative care: Long discussion with 2 daughters at bedside on 5/21-DO NOT RESUSCITATE in place. Family outpatient does not desire any aggressive care-but willing to consider EGD and IVC filter placement. Patient has a history of bilateral lung transplant and follows with [facility]-however for the past few months-patient has stopped getting her regular bronchoscopies because she has gotten more frail and has had problems with sedation/anesthesia. Family realizes that patient is slowly deteriorating and do not wish for a very aggressive care, goals are to keep her comfortable but they want to continue with some medical management at this time. Suspect that if discharged home, may need home hospice to start following her.  Disposition: Remain inpatient-home when EGD/IVC filter placed.  Antimicrobial agents  See below  Anti-infectives    Start     Dose/Rate Route Frequency Ordered Stop   09/07/14 1000  vancomycin (VANCOCIN) 50 mg/mL oral solution 125 mg     125 mg Oral 4 times daily 09/07/14 0915 09/21/14 0959   09/04/14 1800  valGANciclovir (VALCYTE) 450 MG tablet TABS 450 mg     450 mg Oral Every M-W-F 09/04/14 1755  DVT Prophylaxis: None needed  Code Status:  DNR  Family Communication None at bedside this am  Procedures: None  CONSULTS:  GI  Time  spent 35 minutes-Greater than 50% of this time was spent in counseling, explanation of diagnosis, planning of further management, and coordination of care.  MEDICATIONS: Scheduled Meds: . amLODipine  5 mg Oral Q2200  . antiseptic oral rinse  7 mL Mouth Rinse q12n4p  . carvedilol  6.25 mg Oral BID WC  . chlorhexidine  15 mL Mouth Rinse BID  . citalopram  40 mg Oral Daily  . cycloSPORINE modified  125 mg Oral BID  . hydrocortisone  20 mg Oral BID  . isosorbide mononitrate  30 mg Oral Daily  . levothyroxine  100 mcg Oral QAC breakfast  . mycophenolate  500 mg Oral BID  . [START ON 09/08/2014] pantoprazole (PROTONIX) IV  40 mg Intravenous Q12H  . pravastatin  20 mg Oral QHS  . sodium chloride  3 mL Intravenous Q12H  . valGANciclovir  450 mg Oral Q M,W,F  . vancomycin  125 mg Oral QID   Continuous Infusions: . sodium chloride Stopped (09/04/14 2001)  . pantoprozole (PROTONIX) infusion 8 mg/hr (09/07/14 0331)   PRN Meds:.sodium chloride, acetaminophen **OR** acetaminophen, hydrALAZINE, ondansetron **OR** ondansetron (ZOFRAN) IV, sodium chloride    PHYSICAL EXAM: Vital signs in last 24 hours: Filed Vitals:   09/06/14 1627 09/06/14 2103 09/07/14 0540 09/07/14 1114  BP: 110/50 133/56 138/80 130/53  Pulse: 72 66 66 73  Temp:  97.6 F (36.4 C) 97.8 F (36.6 C)   TempSrc:  Oral Oral   Resp:  18 16   Height:      Weight:      SpO2:  100% 98%     Weight change:  Filed Weights   09/04/14 1800 09/05/14 0443  Weight: 61.1 kg (134 lb 11.2 oz) 62 kg (136 lb 11 oz)   Body mass index is 26.69 kg/(m^2).   Gen Exam: Awake and alert with clear speech.   Neck: Supple, No JVD.   Chest: B/L Clear.  No rales or rhonchi CVS: S1 S2 Regular, no murmurs.  Abdomen: soft, BS +, non tender, non distended.  Extremities: no edema, lower extremities warm to touch. Neurologic: Non Focal.   Skin: No Rash.   Wounds: N/A.    Intake/Output from previous day:  Intake/Output Summary (Last 24 hours)  at 09/07/14 1209 Last data filed at 09/07/14 0600  Gross per 24 hour  Intake    450 ml  Output      0 ml  Net    450 ml     LAB RESULTS: CBC  Recent Labs Lab 09/04/14 1209  09/05/14 0500 09/05/14 1149 09/05/14 1815 09/05/14 2244 09/06/14 0515 09/07/14 0530  WBC 18.4*  --  14.7*  --   --   --  11.4* 8.2  HGB 6.1*  < > 8.5* 7.7* 7.9* 7.8* 8.2* 8.0*  HCT 19.3*  < > 25.6* 24.2* 24.0* 23.3* 25.1* 24.5*  PLT 278  --  177  --   --   --  192 214  MCV 103.2*  --  96.2  --   --   --  96.2 96.8  MCH 32.6  --  32.0  --   --   --  31.4 31.6  MCHC 31.6  --  33.2  --   --   --  32.7 32.7  RDW 17.0*  --  19.3*  --   --   --  20.6* 20.2*  LYMPHSABS 1.3  --   --   --   --   --   --   --   MONOABS 0.6  --   --   --   --   --   --   --   EOSABS 0.0  --   --   --   --   --   --   --   BASOSABS 0.0  --   --   --   --   --   --   --   < > = values in this interval not displayed.  Chemistries   Recent Labs Lab 09/04/14 1209 09/05/14 0500 09/06/14 0515 09/07/14 0530  NA 142 143 141 143  K 4.3 3.9 3.9 3.8  CL 112* 117* 117* 119*  CO2 18* 16* 16* 18*  GLUCOSE 109* 107* 204* 175*  BUN 84* 67* 60* 50*  CREATININE 3.06* 2.74* 2.87* 2.91*  CALCIUM 8.4* 7.8* 7.5* 7.6*    CBG: No results for input(s): GLUCAP in the last 168 hours.  GFR Estimated Creatinine Clearance: 13.5 mL/min (by C-G formula based on Cr of 2.91).  Coagulation profile  Recent Labs Lab 09/04/14 1209 09/05/14 0500 09/06/14 0515 09/07/14 0530  INR 4.06* 4.17* 6.64* 3.60*    Cardiac Enzymes No results for input(s): CKMB, TROPONINI, MYOGLOBIN in the last 168 hours.  Invalid input(s): CK  Invalid input(s): POCBNP No results for input(s): DDIMER in the last 72 hours. No results for input(s): HGBA1C in the last 72 hours. No results for input(s): CHOL, HDL, LDLCALC, TRIG, CHOLHDL, LDLDIRECT in the last 72 hours. No results for input(s): TSH, T4TOTAL, T3FREE, THYROIDAB in the last 72 hours.  Invalid input(s):  FREET3 No results for input(s): VITAMINB12, FOLATE, FERRITIN, TIBC, IRON, RETICCTPCT in the last 72 hours. No results for input(s): LIPASE, AMYLASE in the last 72 hours.  Urine Studies No results for input(s): UHGB, CRYS in the last 72 hours.  Invalid input(s): UACOL, UAPR, USPG, UPH, UTP, UGL, UKET, UBIL, UNIT, UROB, ULEU, UEPI, UWBC, URBC, UBAC, CAST, UCOM, BILUA  MICROBIOLOGY: Recent Results (from the past 240 hour(s))  MRSA PCR Screening     Status: None   Collection Time: 09/04/14  5:38 PM  Result Value Ref Range Status   MRSA by PCR NEGATIVE NEGATIVE Final    Comment:        The GeneXpert MRSA Assay (FDA approved for NASAL specimens only), is one component of a comprehensive MRSA colonization surveillance program. It is not intended to diagnose MRSA infection nor to guide or monitor treatment for MRSA infections.   Clostridium Difficile by PCR     Status: Abnormal   Collection Time: 09/06/14 10:00 PM  Result Value Ref Range Status   C difficile by pcr POSITIVE (A) NEGATIVE Final    Comment: CRITICAL RESULT CALLED TO, READ BACK BY AND VERIFIED WITH: J GOOD RN @ [phone] ON 09/07/14 BY C DAVIS     RADIOLOGY STUDIES/RESULTS: US Venous Img Lower Unilateral Right  08/24/2014   CLINICAL DATA:  Varicose veins strip from both legs. History of bilateral lung transplant. Right lower leg swelling for 1 week.  EXAM: Right LOWER EXTREMITY VENOUS DOPPLER ULTRASOUND  TECHNIQUE: Gray-scale sonography with graded compression, as well as color Doppler and duplex ultrasound were performed to evaluate the lower extremity deep venous systems from the level of the common femoral vein and including the common femoral, femoral, profunda femoral, popliteal and calf veins including the  posterior tibial, peroneal and gastrocnemius veins when visible. The superficial great saphenous vein was also interrogated. Spectral Doppler was utilized to evaluate flow at rest and with distal augmentation maneuvers in  the common femoral, femoral and popliteal veins.  COMPARISON:  None.  FINDINGS: There is hypoechoic, expansile thrombus throughout the right deep venous system, extending from the calf veins veins through the femoral vein into the common femoral vein. The majority of the thrombosis is complete/occlusive. Mobile luminal clot present at the level of the common femoral vein. The contralateral common femoral vein has normal respiratory phasicity and no evidence of thrombus.  The patient returned to ordering physician's office to await results in a wheelchair. This study is marked for technologist call report.  IMPRESSION: Diffuse acute right lower extremity deep venous thrombosis with clot from the calf into the common femoral vein. The majority of the thrombosis is occlusive, consider IR clinic referral for thrombolysis evaluation.   Electronically Signed   By: Monte Fantasia M.D.   On: 08/24/2014 13:29    Oren Binet, MD  [facility] Pager:[phone]  If 7PM-7AM, please contact night-coverage [email or web address] Password [email or web address] 09/07/2014, 12:09 PM   LOS: 3 days

## 2014-09-07 NOTE — Evaluation (Signed)
Physical Therapy Evaluation Patient Details Name: Nancy Blair MRN: [id number] DOB: 1935-05-17 Today's Date: 09/07/2014   History of Present Illness  79 year old female who  has a past medical history of Emphysema of lung; Hypertension; Hyperlipidemia; Allergy; Anemia; GERD (gastroesophageal reflux disease); Adrenal insufficiency; Squamous acanthoma of skin (06/2011); Hypothyroidism; CKD (chronic kidney disease) stage 4, GFR 15-29 ml/min; Lung transplant status, bilateral; Acute on chronic diastolic heart failure; Ejection fraction; Diabetes mellitus without complication; and DVT (deep venous thrombosis) (08/2014). Pt admitted with upper GIB, anemia, supratherapeutic INR.   Clinical Impression  Pt admitted with above diagnosis. Pt currently with functional limitations due to the deficits listed below (see PT Problem List). Pt ambulated 200' with min A for balance. Encouraged pt to consider use of a RW, however she is currently refusing. ST-SNF recommended.  Pt will benefit from skilled PT to increase their independence and safety with mobility to allow discharge to the venue listed below.       Follow Up Recommendations Home health PT    Equipment Recommendations  Other (comment) (encouraged pt to consider rollator, she's resistant)    Recommendations for Other Services       Precautions / Restrictions Precautions Precautions: Fall Precaution Comments: 3 recent falls Restrictions Weight Bearing Restrictions: No      Mobility  Bed Mobility Overal bed mobility: Modified Independent             General bed mobility comments: with rail  Transfers Overall transfer level: Modified independent Equipment used: None Transfers: Sit to/from Stand Sit to Stand: Supervision         General transfer comment: supervision for safety. mild sway but no physical assist needed from lowest bed setting  Ambulation/Gait Ambulation/Gait assistance: Min assist;Min guard Ambulation  Distance (Feet): 200 Feet Assistive device: 1 person hand held assist   Gait velocity: decreased   General Gait Details: Minor sway noted at times and did require  physical assist x1 when she scissored. reaches for rail in hall intermittently as she fatigues. Required 2 standing rest breaks to complete distance. 3/4 dyspna. Dyspnea improves with rest and cues for pursed lip breathing.Per daughter dyspnea is baseline.  Stairs            Wheelchair Mobility    Modified Rankin (Stroke Patients Only)       Balance     Sitting balance-Leahy Scale: Good       Standing balance-Leahy Scale: Fair                               Pertinent Vitals/Pain Pain Assessment: No/denies pain    Home Living Family/patient expects to be discharged to:: Skilled nursing facility (from [facility] IL[facility]) Living Arrangements: Alone Available Help at Discharge: Family (sister and daughter live near-by) Type of Home: Independent living facility Home Access: Level entry     Home Layout: One level Home Equipment: Shower seat      Prior Function Level of Independence: Independent         Comments: daughter stated nursing is checking in on pt to supervise ADLs, doesn't participate in activities at facility, walks to dining room for meals but otherwise isn't active, pt has refused HHPT several times recently,     Hand Dominance   Dominant Hand: Left    Extremity/Trunk Assessment   Upper Extremity Assessment: Defer to OT evaluation           Lower Extremity Assessment: Generalized  weakness RLE Deficits / Details: R knee extension +4/5, L 4/5; daughter stated pt doesn't have strength to step up onto a step    Cervical / Trunk Assessment: Normal  Communication   Communication: No difficulties  Cognition Arousal/Alertness: Awake/alert Behavior During Therapy: WFL for tasks assessed/performed Overall Cognitive Status: History of cognitive impairments - at baseline  (daughter reports pt has some early demenita, daughter provided some details of PLOF)       Memory: Decreased short-term memory              General Comments General comments (skin integrity, edema, etc.): reinforced importance of mobility    Exercises        Assessment/Plan    PT Assessment Patient needs continued PT services  PT Diagnosis Generalized weakness   PT Problem List Decreased strength;Decreased activity tolerance;Decreased balance;Decreased mobility;Decreased knowledge of use of DME  PT Treatment Interventions DME instruction;Gait training;Functional mobility training;Therapeutic exercise;Therapeutic activities;Balance training;Patient/family education   PT Goals (Current goals can be found in the Care Plan section) Acute Rehab PT Goals Patient Stated Goal: go home PT Goal Formulation: With patient Time For Goal Achievement: 09/21/14 Potential to Achieve Goals: Good    Frequency Min 3X/week   Barriers to discharge Decreased caregiver support family would like ST-SNF then return to ILF    Co-evaluation               End of Session Equipment Utilized During Treatment: Gait belt Activity Tolerance: Patient tolerated treatment well;No increased pain Patient left: with call bell/phone within reach;in chair Nurse Communication: Mobility status         Time: 1696-7893 PT Time Calculation (min) (ACUTE ONLY): 23 min   Charges:   PT Evaluation $Initial PT Evaluation Tier I: 1 Procedure PT Treatments $Gait Training: 8-22 mins   PT G Codes:        Philomena Doheny 09/07/2014, 12:40 PM [phone]

## 2014-09-07 NOTE — Progress Notes (Signed)
    Progress Note   Subjective  no abdominal pain. Want to eat   Objective   Vital signs in last 24 hours: Temp:  [97.6 F (36.4 C)-97.8 F (36.6 C)] 97.8 F (36.6 C) (05/23 0540) Pulse Rate:  [66-79] 66 (05/23 0540) Resp:  [14-24] 16 (05/23 0540) BP: (95-191)/(42-80) 138/80 mmHg (05/23 0540) SpO2:  [95 %-100 %] 98 % (05/23 0540) Last BM Date: 09/06/14 General:    white female in NAD Heart:  Regular rate and rhythm Abdomen:  Soft, nontender and nondistended. Normal bowel sounds. Extremities:  Without edema. Neurologic:  Alert and oriented,  grossly normal neurologically. Psych:  Cooperative. Normal mood and affect.    Lab Results:  Recent Labs  09/05/14 0500  09/05/14 2244 09/06/14 0515 09/07/14 0530  WBC 14.7*  --   --  11.4* 8.2  HGB 8.5*  < > 7.8* 8.2* 8.0*  HCT 25.6*  < > 23.3* 25.1* 24.5*  PLT 177  --   --  192 214  < > = values in this interval not displayed. BMET  Recent Labs  09/05/14 0500 09/06/14 0515 09/07/14 0530  NA 143 141 143  K 3.9 3.9 3.8  CL 117* 117* 119*  CO2 16* 16* 18*  GLUCOSE 107* 204* 175*  BUN 67* 60* 50*  CREATININE 2.74* 2.87* 2.91*  CALCIUM 7.8* 7.5* 7.6*   LFT  Recent Labs  09/05/14 0500  PROT 4.6*  ALBUMIN 2.4*  AST 17  ALT 18  ALKPHOS 24*  BILITOT 1.7*   PT/INR  Recent Labs  09/06/14 0515 09/07/14 0530  LABPROT 55.6* 35.1*  INR 6.64* 3.60*      Assessment / Plan:   24. 79 year old female with complex medical history not limited to double lung transplant on chronic immunosuppressants, CKD, HTN, [facility].  2. Upper GI bleed. Acute epigastric pain with melena Friday and Saturday in setting of supratherapeutic INR. No further bleeding or pain in two days. Rule out PUD, especially in setting of steroids and aspirin at home. Will need EGD when INR below 2. She is getting Vitamin K. Spoke with Hospitalist who is thinking about IVC filter and Eliquis ( too risky to resume coumadin, INR rose  to 6 after just one week)  Of note, we patient in 2013 for "dark stools". EGD at that time revealed only gastritis.    3. C-diff. She has chronic diarrhea which recently became much worse. She received Cipro earlier this month. Abdomen non-tender, WBC normal. Afebrile. Will start her on oral Vanco given renal disease.   4. Acute on chronic anemia, s/p 2 units of blood. Hgb rose appropriately, it is stable at 8.0 today.     LOS: 3 days   Tye Savoy  09/07/2014, 8:44 AM

## 2014-09-07 NOTE — Progress Notes (Signed)
PHARMACY BRIEF NOTE:  ANTICOAGULANT SELECTION AND COUNSELING  Visited patient and daughter at request of attending MD to discuss potentially starting Eliquis.  Patient is a 79 y/o F with complex PMH including lung transplant (requires immunosuppressant therapy), HF, DM, CKD and a RLE DVT diagnosed earlier this month.   The DVT was treated with Lovenox followed by transition to Coumadin, but her INR was supratherapeutic despite small Coumadin doses and she experienced an UGI bleed.  Plan is for EGD when INR < 2 and for IVC filter placement, but seeking to continue a low-dose anticoagulant of some type if possible.    Given age < 22 yr, wt > 60 kg, but SCr > 1.5, patient technically meets the guidelines in prescribing information to use Eliquis at a standard dosage.  However, with the CKD her CrCl is approximately 10 to 15 mL/min, and patients with CrCl < 25 mL/min were excluded from clinical trials.    Attending MD has proposed using Eliquis at a reduced dosage of 2.5 mg BID.  Reviewed above in detail with patient and daughter.   They are agreeable with using Eliquis 2.5 mg BID when safe to resume anticoagulation, realizing that there are no clinical trial data for use of the medication in the setting of this degree of CKD. Daughter asks if patient can remain off ASA rather than resuming if Eliquis is used.  She states patient has no known CAD (this is consistent with medical record) so appears indication may have been primary cardiovascular prophylaxis in setting of HF and DM.  Recommend: When safe to resume anticoagulation, consider using Eliquis 2.5 mg BID and not resuming ASA.  Clayburn Pert, PharmD, BCPS Pager: [phone] 09/07/2014  10:18 AM

## 2014-09-07 NOTE — Plan of Care (Signed)
Problem: Consults Goal: Diagnosis - Venous Thromboembolism (VTE) Choose a selection Outcome: Completed/Met Date Met:  09/07/14 DVT (Deep Vein Thrombosis)

## 2014-09-07 NOTE — Progress Notes (Signed)
CSW assisting with d/c planning. Pt out of room for procedure. Pt recommends HHPT following hospital d/c.Pt's daughter reports that pt will return to her Independent living community at [facility]. RNCM will assist with d/c planning needs.  Werner Lean LCSW [phone]

## 2014-09-07 NOTE — Procedures (Signed)
IVCgram and IVC filter placement No complication No blood loss. See complete dictation in [facility].

## 2014-09-08 ENCOUNTER — Encounter (HOSPITAL_COMMUNITY): Payer: Self-pay | Admitting: Internal Medicine

## 2014-09-08 ENCOUNTER — Encounter (HOSPITAL_COMMUNITY)
Admission: EM | Disposition: A | Discharge status: Home Health | Payer: Self-pay | Source: Home / Self Care | Attending: Internal Medicine

## 2014-09-08 HISTORY — PX: ESOPHAGOGASTRODUODENOSCOPY: SHX5428

## 2014-09-08 LAB — BASIC METABOLIC PANEL
ANION GAP: 8 (ref 5–15)
BUN: 50 mg/dL — ABNORMAL HIGH (ref 6–20)
CALCIUM: 7.6 mg/dL — AB (ref 8.9–10.3)
CHLORIDE: 113 mmol/L — AB (ref 101–111)
CO2: 20 mmol/L — ABNORMAL LOW (ref 22–32)
Creatinine, Ser: 2.97 mg/dL — ABNORMAL HIGH (ref 0.44–1.00)
GFR calc Af Amer: 17 mL/min — ABNORMAL LOW (ref >60)
GFR calc non Af Amer: 14 mL/min — ABNORMAL LOW (ref >60)
GLUCOSE: 124 mg/dL — AB (ref 65–99)
Potassium: 3.2 mmol/L — ABNORMAL LOW (ref 3.5–5.1)
SODIUM: 141 mmol/L (ref 135–145)

## 2014-09-08 LAB — CBC
HCT: 24.9 % — ABNORMAL LOW (ref 36.0–46.0)
Hemoglobin: 7.9 g/dL — ABNORMAL LOW (ref 12.0–15.0)
MCH: 30.6 pg (ref 26.0–34.0)
MCHC: 31.7 g/dL (ref 30.0–36.0)
MCV: 96.5 fL (ref 78.0–100.0)
PLATELETS: 210 10*3/uL (ref 150–400)
RBC: 2.58 MIL/uL — ABNORMAL LOW (ref 3.87–5.11)
RDW: 19.7 % — AB (ref 11.5–15.5)
WBC: 7.5 10*3/uL (ref 4.0–10.5)

## 2014-09-08 LAB — PROTIME-INR
INR: 1.59 — AB (ref 0.00–1.49)
Prothrombin Time: 19 seconds — ABNORMAL HIGH (ref 11.6–15.2)

## 2014-09-08 SURGERY — EGD (ESOPHAGOGASTRODUODENOSCOPY)
Anesthesia: Moderate Sedation

## 2014-09-08 MED ORDER — MIDAZOLAM HCL 10 MG/2ML IJ SOLN
INTRAMUSCULAR | Status: AC
  Filled 2014-09-08: qty 2

## 2014-09-08 MED ORDER — FENTANYL CITRATE (PF) 100 MCG/2ML IJ SOLN
INTRAMUSCULAR | Status: DC | PRN
  Administered 2014-09-08 (×2): 25 ug via INTRAVENOUS

## 2014-09-08 MED ORDER — FENTANYL CITRATE (PF) 100 MCG/2ML IJ SOLN
INTRAMUSCULAR | Status: AC
  Filled 2014-09-08: qty 2

## 2014-09-08 MED ORDER — MIDAZOLAM HCL 10 MG/2ML IJ SOLN
INTRAMUSCULAR | Status: DC | PRN
  Administered 2014-09-08 (×2): 2 mg via INTRAVENOUS

## 2014-09-08 MED ORDER — BUTAMBEN-TETRACAINE-BENZOCAINE 2-2-14 % EX AERO
INHALATION_SPRAY | CUTANEOUS | Status: DC | PRN
  Administered 2014-09-08: 2 via TOPICAL

## 2014-09-08 MED ORDER — AMLODIPINE BESYLATE 10 MG PO TABS
10.0000 mg | ORAL_TABLET | Freq: Every day | ORAL | Status: DC
  Administered 2014-09-08: 10 mg via ORAL
  Filled 2014-09-08 (×2): qty 1

## 2014-09-08 NOTE — Care Management Note (Signed)
Case Management Note  Patient Details  Name: Nancy Blair MRN: [id number] Date of Birth: [birth year]  Subjective/Objective:         Admitted with GI bleed            Action/Plan: Discharge planning. Spoke with daughter at bedside, plans are to return to [place] [facility] with 24h caregivers. Resume RN and PT with [place].   Expected Discharge Date:   (UNKNOWN)               Expected Discharge Plan:  [place]  In-House Referral:  NA  Discharge planning Services  CM Consult  Post Acute Care Choice:    Choice offered to:  Adult Children  DME Arranged:    DME Agency:     HH Arranged:  RN, PT HH Agency:  [facility]  Status of Service:  Completed, signed off  Medicare Important Message Given:  Yes Date Medicare IM Given:  09/08/14 Medicare IM give by:  Sunday Spillers RN CM  Date Additional Medicare IM Given:    Additional Medicare Important Message give by:     If discussed at [place] of Stay Meetings, dates discussed:    Additional Comments:  Guadalupe Maple, RN 09/08/2014, 3:43 PM

## 2014-09-08 NOTE — Progress Notes (Signed)
Physical Therapy Treatment Patient Details Name: Nancy Blair MRN: [id number] DOB: Aug 22, 1935 Today's Date: 09/08/2014    History of Present Illness 79 year old female adm with upper GIB, anemia, supratherapeutic INR;    PMHx:  HTN,  Allergy,  Anemia, GERD,  Hypothyroidism, CKD, Lung transplant status, Acute on chronic diastolic heart failure; Ejection fraction; Diabetes mellitus without complication; and DVT     PT Comments    Pt cooperative after much encouragement; Pt attempted to refuse but then agreed to mobility; son present and encouraging; Pt  Requires min to Min/guard with gait d/t decr balance but refuses to use AD   Follow Up Recommendations  Home health PT;Supervision for mobility/OOB (pt is very unsafe with gait, refuses walker)     Equipment Recommendations  Other (comment) (refuses assistive device)    Recommendations for Other Services       Precautions / Restrictions Precautions Precautions: Fall Precaution Comments: 3 recent falls Restrictions Weight Bearing Restrictions: No    Mobility  Bed Mobility Overal bed mobility: Modified Independent             General bed mobility comments: with rail  Transfers Overall transfer level: Needs assistance Equipment used: None Transfers: Sit to/from Stand Sit to Stand: Supervision         General transfer comment: supervision for safety, postural sway upon standing  Ambulation/Gait Ambulation/Gait assistance: Min guard;Min assist Ambulation Distance (Feet): 250 Feet Assistive device: None;1 person hand held assist Gait Pattern/deviations: Step-through pattern;Decreased stride length;Drifts right/left;Narrow base of support (occasional scissoring) Gait velocity: pt refuses to use any assistive device   General Gait Details: pt unsteady initially, pt requires HHA of 1    Stairs            Wheelchair Mobility    Modified Rankin (Stroke Patients Only)       Balance Overall balance  assessment: Needs assistance;History of Falls   Sitting balance-Leahy Scale: Good     Standing balance support: During functional activity;No upper extremity supported Standing balance-Leahy Scale: Fair               High level balance activites: Direction changes;Turns;Head turns High Level Balance Comments: pt requires support to maintain balance, prevent falls; decr balance reactions    Cognition Arousal/Alertness: Awake/alert Behavior During Therapy: WFL for tasks assessed/performed Overall Cognitive Status: Within Functional Limits for tasks assessed ((early dementia per dtr))                      Exercises      General Comments        Pertinent Vitals/Pain Pain Assessment: No/denies pain    Home Living                      Prior Function            PT Goals (current goals can now be found in the care plan section) Acute Rehab PT Goals Patient Stated Goal: go home PT Goal Formulation: With patient Time For Goal Achievement: 09/21/14 Potential to Achieve Goals: Good Progress towards PT goals: Progressing toward goals    Frequency  Min 3X/week    PT Plan Current plan remains appropriate    Co-evaluation             End of Session Equipment Utilized During Treatment: Gait belt Activity Tolerance: Patient tolerated treatment well;No increased pain Patient left: in bed;with call bell/phone within reach;with family/visitor present     Time: 4193-7902  PT Time Calculation (min) (ACUTE ONLY): 17 min  Charges:  $Gait Training: 8-22 mins                    G Codes:      Nancy Blair 19-Sep-2014, 12:18 PM

## 2014-09-08 NOTE — Progress Notes (Signed)
    Spoke with patient and daughter in room. INR down to 1.5, they are interested in proceeding with the EGD today. Will make NPO now. The EGD will be around 2pm today.   Tye Savoy, NP-C

## 2014-09-08 NOTE — Progress Notes (Addendum)
PATIENT DETAILS Name: Nancy Blair Age: 79 y.o. 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 INR supratherapeutic on presentation. INR reversed with vitamin K, subsequently underwent IVC filter placement, due for EGD on 5/24. Has required 2 units of PRBC. Plans are to discharge home once okay with GI, after extensive discussion with family/pharmacy, we are contemplating resuming anticoagulation but with Eliquis (for more predictable anticoagulation-see below) when okay with GI  Subjective: No diarrhea.Doing well.  Assessment/Plan: Active Problems: Upper GI bleed: Admitted with melanotic stools-this is in a setting of supratherapeutic INR. Continue PPI. Hb stable. GI plans endoscopy on 5/24. GI bleeding seems to have resolved.  Acute blood loss anemia: Secondary to above. Transfused 2 units of PRBC so for. Hemoglobin stable this morning. Continue to monitor hemoglobin closely and transfuse as needed.  Acute on chronic kidney disease stage IV: Acute renal failure likely secondary to prerenal azotemia from upper GI bleeding. Creatinine close to baseline. Continue Lasix. Follows with Dr Nancy Blair family, they do not desire dialysis because of overall poor health.  Coagulopathy/supratherapeutic INR: Recently started on Coumadin for right lower extremity DVT. INR finally down to 1.5 after multiple doses of vitamin K since admission. Please see below regarding anticoagulation   C. difficile colitis: Immunosuppressed, on Bactrim chronically-started vancomycin on 5/23-will plan a 2 week course. Diarrhea seems to have resolved-but per family she has 2-3 loose stools on a regular basis.  Recent right lower  extremity DVT: On Coumadin prior to admission. Consulted interventional radiology, now is status post IVC filter placement on 5/23  Spoke with daughter (a vascular RN) at length on 5/21.Apparently patient has had exaggerated responses with elevated INR even with very low doses of Coumadin. Very difficult situation, ideally given significant clot burden does need anticoagulation-however suspect that she needs an agent that has a more predictable anticoagulation effect than Coumadin. Patient is mostly sedentary-now with an IVC filter, suspect that she could continue to increase her clot burden in the right lower extremity. I have proposed that we use low-dose Eliquis when we are able to resume anticoagulation. I have asked pharmacy to see if we can discuss/counsel patient and daughter. For now and in the immediate future, we will need continue to hold off on starting any form of anticoagulation until EGD can be completed. Timing of resumption of anticoagulation will depend on GI recommendation. Please note,option of placing a filter and discontinuing anticoagulation permanently has also been discussed with the daughter. She is contemplating both options.  History of lung transplant: Continue steroids, cyclosporine, CellCept. On valganciclovir Monday Wednesday and Friday. Initially followed at [facility] since no longer tolerating periodic bronchoscopies-only follows when necessary. At the request of family, will check cyclosporine level-can be followed by PCP or nephrology on discharge  History of hypothyroidism: Continue levothyroxine  Hypertension: Uncontrolled today, continue Coreg, Imdur, increase amlodipine to 10 mg.  If GI bleeding reoccurs may need to stop/minimize  Palliative care: Long discussion with 2 daughters at bedside on 5/21, and on multiple locations since then. Patient is a DNR. Family does not desire any aggressive care-but willing to consider EGD and IVC filter placement. Patient has a  history of bilateral lung transplant and follows  with [facility]-however for the past few months-patient has stopped getting her regular bronchoscopies because she has gotten more frail and has had problems with sedation/anesthesia. Family realizes that patient is slowly deteriorating and do not wish for a very aggressive care, goals are to keep her comfortable but they want to continue with some medical management at this time. Suspect that if discharged home, may need home hospice to start following her.See above regarding anticoagulation  Disposition: Remain inpatient-home on 5/25  Antimicrobial agents  See below  Anti-infectives    Start     Dose/Rate Route Frequency Ordered Stop   09/07/14 1000  vancomycin (VANCOCIN) 50 mg/mL oral solution 125 mg     125 mg Oral 4 times daily 09/07/14 0915 09/21/14 0959   09/04/14 1800  valGANciclovir (VALCYTE) 450 MG tablet TABS 450 mg     450 mg Oral Every M-W-F 09/04/14 1755        DVT Prophylaxis: None needed  Code Status:  DNR  Family Communication None at bedside this am  Procedures: None  CONSULTS:  GI  Time spent 35 minutes-Greater than 50% of this time was spent in counseling, explanation of diagnosis, planning of further management, and coordination of care.  MEDICATIONS: Scheduled Meds: . amLODipine  5 mg Oral Q2200  . antiseptic oral rinse  7 mL Mouth Rinse q12n4p  . carvedilol  6.25 mg Oral BID WC  . chlorhexidine  15 mL Mouth Rinse BID  . citalopram  40 mg Oral Daily  . cycloSPORINE modified  125 mg Oral BID  . hydrocortisone  20 mg Oral BID  . isosorbide mononitrate  30 mg Oral Daily  . levothyroxine  100 mcg Oral QAC breakfast  . mycophenolate  500 mg Oral BID  . pantoprazole (PROTONIX) IV  40 mg Intravenous Q12H  . pravastatin  20 mg Oral QHS  . sodium chloride  3 mL Intravenous Q12H  . valGANciclovir  450 mg Oral Q M,W,F  . vancomycin  125 mg Oral QID   Continuous Infusions: . sodium chloride Stopped (09/04/14  2001)   PRN Meds:.sodium chloride, acetaminophen **OR** acetaminophen, hydrALAZINE, ondansetron **OR** ondansetron (ZOFRAN) IV, sodium chloride    PHYSICAL EXAM: Vital signs in last 24 hours: Filed Vitals:   09/07/14 2320 09/08/14 0416 09/08/14 0421 09/08/14 0728  BP: 134/68 184/88 170/68 191/82  Pulse: 70 70    Temp: 97.9 F (36.6 C) 98.4 F (36.9 C)    TempSrc: Oral Oral    Resp: 16 16    Height:      Weight:      SpO2: 100% 99%      Weight change:  Filed Weights   09/04/14 1800 09/05/14 0443  Weight: 61.1 kg (134 lb 11.2 oz) 62 kg (136 lb 11 oz)   Body mass index is 26.69 kg/(m^2).   Gen Exam: Awake and alert with clear speech.   Neck: Supple, No JVD.   Chest: B/L Clear.  No rales or rhonchi CVS: S1 S2 Regular, no murmurs.  Abdomen: soft, BS +, non tender, non distended.  Extremities: no edema, lower extremities warm to touch. Neurologic: Non Focal.   Skin: No Rash.   Wounds: N/A.    Intake/Output from previous day:  Intake/Output Summary (Last 24 hours) at 09/08/14 1332 Last data filed at 09/08/14 1015  Gross per 24 hour  Intake    450 ml  Output    500 ml  Net    -50 ml     LAB RESULTS:  CBC  Recent Labs Lab 09/04/14 1209  09/05/14 0500  09/05/14 1815 09/05/14 2244 09/06/14 0515 09/07/14 0530 09/08/14 0450  WBC 18.4*  --  14.7*  --   --   --  11.4* 8.2 7.5  HGB 6.1*  < > 8.5*  < > 7.9* 7.8* 8.2* 8.0* 7.9*  HCT 19.3*  < > 25.6*  < > 24.0* 23.3* 25.1* 24.5* 24.9*  PLT 278  --  177  --   --   --  192 214 210  MCV 103.2*  --  96.2  --   --   --  96.2 96.8 96.5  MCH 32.6  --  32.0  --   --   --  31.4 31.6 30.6  MCHC 31.6  --  33.2  --   --   --  32.7 32.7 31.7  RDW 17.0*  --  19.3*  --   --   --  20.6* 20.2* 19.7*  LYMPHSABS 1.3  --   --   --   --   --   --   --   --   MONOABS 0.6  --   --   --   --   --   --   --   --   EOSABS 0.0  --   --   --   --   --   --   --   --   BASOSABS 0.0  --   --   --   --   --   --   --   --   < > = values in  this interval not displayed.  Chemistries   Recent Labs Lab 09/04/14 1209 09/05/14 0500 09/06/14 0515 09/07/14 0530 09/08/14 0450  NA 142 143 141 143 141  K 4.3 3.9 3.9 3.8 3.2*  CL 112* 117* 117* 119* 113*  CO2 18* 16* 16* 18* 20*  GLUCOSE 109* 107* 204* 175* 124*  BUN 84* 67* 60* 50* 50*  CREATININE 3.06* 2.74* 2.87* 2.91* 2.97*  CALCIUM 8.4* 7.8* 7.5* 7.6* 7.6*    CBG: No results for input(s): GLUCAP in the last 168 hours.  GFR Estimated Creatinine Clearance: 13.3 mL/min (by C-G formula based on Cr of 2.97).  Coagulation profile  Recent Labs Lab 09/04/14 1209 09/05/14 0500 09/06/14 0515 09/07/14 0530 09/08/14 0450  INR 4.06* 4.17* 6.64* 3.60* 1.59*    Cardiac Enzymes No results for input(s): CKMB, TROPONINI, MYOGLOBIN in the last 168 hours.  Invalid input(s): CK  Invalid input(s): POCBNP No results for input(s): DDIMER in the last 72 hours. No results for input(s): HGBA1C in the last 72 hours. No results for input(s): CHOL, HDL, LDLCALC, TRIG, CHOLHDL, LDLDIRECT in the last 72 hours. No results for input(s): TSH, T4TOTAL, T3FREE, THYROIDAB in the last 72 hours.  Invalid input(s): FREET3 No results for input(s): VITAMINB12, FOLATE, FERRITIN, TIBC, IRON, RETICCTPCT in the last 72 hours. No results for input(s): LIPASE, AMYLASE in the last 72 hours.  Urine Studies No results for input(s): UHGB, CRYS in the last 72 hours.  Invalid input(s): UACOL, UAPR, USPG, UPH, UTP, UGL, UKET, UBIL, UNIT, UROB, ULEU, UEPI, UWBC, URBC, UBAC, CAST, UCOM, BILUA  MICROBIOLOGY: Recent Results (from the past 240 hour(s))  MRSA PCR Screening     Status: None   Collection Time: 09/04/14  5:38 PM  Result Value Ref Range Status   MRSA by PCR NEGATIVE NEGATIVE Final    Comment:  The GeneXpert MRSA Assay (FDA approved for NASAL specimens only), is one component of a comprehensive MRSA colonization surveillance program. It is not intended to diagnose MRSA infection  nor to guide or monitor treatment for MRSA infections.   Clostridium Difficile by PCR     Status: Abnormal   Collection Time: 09/06/14 10:00 PM  Result Value Ref Range Status   C difficile by pcr POSITIVE (A) NEGATIVE Final    Comment: CRITICAL RESULT CALLED TO, READ BACK BY AND VERIFIED WITH: J GOOD RN @ [phone] ON 09/07/14 BY C DAVIS     RADIOLOGY STUDIES/RESULTS: Ir Ivc Filter Plmt / S&i /img Guid/mod Sed  09/07/2014   CLINICAL DATA:  Lower extremity DVT. Exacerbation of lower GI bleed on anticoagulation, a relative contraindication. Caval filtration is requested. Renal insufficiency.  EXAM: INFERIOR VENACAVOGRAM  IVC FILTER PLACEMENT UNDER FLUOROSCOPY  FLUOROSCOPY TIME:  0.7 minutes, 153.79 uGym2 DAP  TECHNIQUE: The procedure, risks (including but not limited to bleeding, infection, organ damage ), benefits, and alternatives were explained to the patient. Questions regarding the procedure were encouraged and answered. The patient understands and consents to the procedure. Patency of the right IJ vein was confirmed with ultrasound with image documentation. An appropriate skin site was determined. Skin site was marked, prepped with chlorhexidine, and draped using maximum barrier technique. The region was infiltrated locally with 1% lidocaine.  Intravenous Fentanyl and Versed were administered as conscious sedation during continuous cardiorespiratory monitoring by the radiology RN, with a total moderate sedation time of 8 minutes.  Under real-time ultrasound guidance, the right IJ vein was accessed with a 21 gauge micropuncture needle; the needle tip within the vein was confirmed with ultrasound image documentation. The needle was exchanged over a 018 guidewire for a transitional dilator, which allow advancement of the [facility] wire into the IVC. A long 6 French vascular sheath was placed for inferior venacavography using CO2. This demonstrated no caval thrombus. Renal vein inflows were evident.  The  [facility] IVC filter was advanced through the sheath and successfully deployed under fluoroscopy at the L1-2 level. Followup CO2 cavagram demonstrates stable filter position and no evident complication. The sheath was removed and hemostasis achieved at the site. No immediate complication.  IMPRESSION: 1. Normal IVC. No thrombus or significant anatomic variation. 2. Technically successful infrarenal IVC filter placement. This is a retrievable model.   Electronically Signed   By: Lucrezia Europe M.D.   On: 09/07/2014 16:36   US Venous Img Lower Unilateral Right  08/24/2014   CLINICAL DATA:  Varicose veins strip from both legs. History of bilateral lung transplant. Right lower leg swelling for 1 week.  EXAM: Right LOWER EXTREMITY VENOUS DOPPLER ULTRASOUND  TECHNIQUE: Gray-scale sonography with graded compression, as well as color Doppler and duplex ultrasound were performed to evaluate the lower extremity deep venous systems from the level of the common femoral vein and including the common femoral, femoral, profunda femoral, popliteal and calf veins including the posterior tibial, peroneal and gastrocnemius veins when visible. The superficial great saphenous vein was also interrogated. Spectral Doppler was utilized to evaluate flow at rest and with distal augmentation maneuvers in the common femoral, femoral and popliteal veins.  COMPARISON:  None.  FINDINGS: There is hypoechoic, expansile thrombus throughout the right deep venous system, extending from the calf veins veins through the femoral vein into the common femoral vein. The majority of the thrombosis is complete/occlusive. Mobile luminal clot present at the level of the common femoral vein. The contralateral common  femoral vein has normal respiratory phasicity and no evidence of thrombus.  The patient returned to ordering physician's office to await results in a wheelchair. This study is marked for technologist call report.  IMPRESSION: Diffuse acute right lower  extremity deep venous thrombosis with clot from the calf into the common femoral vein. The majority of the thrombosis is occlusive, consider IR clinic referral for thrombolysis evaluation.   Electronically Signed   By: Monte Fantasia M.D.   On: 08/24/2014 13:29    Oren Binet, MD  [facility] Pager:[phone]  If 7PM-7AM, please contact night-coverage [email or web address] Password [email or web address] 09/08/2014, 1:32 PM   LOS: 4 days

## 2014-09-08 NOTE — Progress Notes (Signed)
Patient did not want to walk in the evening said she will today.  Roland Rack, RN 09/08/14 [phone]

## 2014-09-08 NOTE — Op Note (Signed)
[facility] [place], [address]   ENDOSCOPY PROCEDURE REPORT  PATIENT: Nancy Blair, Nancy Blair  MR#: [id number] BIRTHDATE: 1936/03/25 , 78  yrs. old GENDER: female ENDOSCOPIST: Jerene Bears, MD REFERRED BY:  [facility] PROCEDURE DATE:  09/08/2014 PROCEDURE:  EGD, diagnostic and EGD w/ biopsy ASA CLASS:     Class III INDICATIONS:  melena and acute post hemorrhagic anemia. MEDICATIONS: Versed 4 mg IV and Fentanyl 50 mcg IV TOPICAL ANESTHETIC: Cetacaine Spray  DESCRIPTION OF PROCEDURE: After the risks benefits and alternatives of the procedure were thoroughly explained, informed consent was obtained.  The EG [id number] ( [id number] ) endoscope was introduced through the mouth and advanced to the second portion of the duodenum , Without limitations.  The instrument was slowly withdrawn as the mucosa was fully examined.   ESOPHAGUS: There is and esophageal stricture with associated ulceration and in a separate location mucosal tear seen in the distal esophagus at approximately 32 cm from the incisors. The stricture is located proximal to a recurrent hiatal hernia, after prior Nissen fundoplication.  The stricture is traversable without resistance using the adult upper endoscope.   This is likely the source of recent melena, though no active bleeding today.  There is an [place] of salmon-colored mucosa just proximal to the esophageal stricture which is likely a small patch of Barrett's mucosa.  STOMACH: A prior Nissen fundoplication was found.  Recurrent 3 cm hiatal hernia above the wrap.  There is an area of mucosal thickening at the wrap site which had overlying normal mucosa felt to be postsurgical change. There was mild antral gastropathy noted.   DUODENUM: A sessile polypoid lesion measuring 10 mm in size was found in the 2nd part of the duodenum.  Multiple biopsies was performed using cold forceps to rule out adenoma.  The remaining examined duodenum  and the bulb and second portion revealed unremarkable mucosa.  Retroflexed views revealed a hiatal hernia.     The scope was then withdrawn from the patient and the procedure completed.  COMPLICATIONS: There were no immediate complications.  ENDOSCOPIC IMPRESSION: 1.   Esophageal stricture at 32 cm with esophagitis, ulceration and mucosal tear.  Likely source of recent melanoma 2.   Small island of probable Barrett's esophagus 3.   Prior Nissen fundoplication was found in the proximal stomach with 2-3 cm recurrent hiatal hernia 4.   There was mild antral gastropathy noted 5.   Sessile polypoid lesion was found in the 2nd part of the duodenum; multiple biopsies was performed  RECOMMENDATIONS: 1.  Twice daily PPI 2.  Avoid NSAIDs 3.  Liquid Carafate 1 g before meals and at bedtime 4.  Reflux precautions, remain upright 90 minutes after eating and drinking 5.  Await pathology results 6.  Recommend repeat endoscopy in 8-12 weeks to document healing   eSigned:  Jerene Bears, MD 09/08/2014 3:06 PM    CC: the patient  PATIENT NAME:  Nancy Blair, Nancy Blair MR#: [id number]

## 2014-09-09 ENCOUNTER — Telehealth: Payer: Self-pay | Admitting: *Deleted

## 2014-09-09 ENCOUNTER — Encounter (HOSPITAL_COMMUNITY): Payer: Self-pay | Admitting: Internal Medicine

## 2014-09-09 DIAGNOSIS — Z942 Lung transplant status: Secondary | ICD-10-CM

## 2014-09-09 DIAGNOSIS — I82401 Acute embolism and thrombosis of unspecified deep veins of right lower extremity: Secondary | ICD-10-CM

## 2014-09-09 DIAGNOSIS — A047 Enterocolitis due to Clostridium difficile: Secondary | ICD-10-CM

## 2014-09-09 LAB — CBC
HEMATOCRIT: 24.5 % — AB (ref 36.0–46.0)
HEMOGLOBIN: 8.1 g/dL — AB (ref 12.0–15.0)
MCH: 31.6 pg (ref 26.0–34.0)
MCHC: 33.1 g/dL (ref 30.0–36.0)
MCV: 95.7 fL (ref 78.0–100.0)
PLATELETS: 237 10*3/uL (ref 150–400)
RBC: 2.56 MIL/uL — ABNORMAL LOW (ref 3.87–5.11)
RDW: 19.9 % — AB (ref 11.5–15.5)
WBC: 7.4 10*3/uL (ref 4.0–10.5)

## 2014-09-09 MED ORDER — PANTOPRAZOLE SODIUM 40 MG PO TBEC: 40.0000 mg | DELAYED_RELEASE_TABLET | Freq: Every day | ORAL | Status: DC

## 2014-09-09 MED ORDER — POTASSIUM CHLORIDE CRYS ER 20 MEQ PO TBCR: 20.0000 meq | EXTENDED_RELEASE_TABLET | Freq: Every day | ORAL | Status: DC

## 2014-09-09 MED ORDER — PANTOPRAZOLE SODIUM 40 MG PO TBEC: 40.0000 mg | DELAYED_RELEASE_TABLET | Freq: Two times a day (BID) | ORAL | Status: DC

## 2014-09-09 MED ORDER — VANCOMYCIN 50 MG/ML ORAL SOLUTION: 125.0000 mg | Freq: Four times a day (QID) | ORAL | Status: AC

## 2014-09-09 MED ORDER — SUCRALFATE 1 GM/10ML PO SUSP: 1.0000 g | Freq: Three times a day (TID) | ORAL | Status: DC

## 2014-09-09 NOTE — Progress Notes (Signed)
Patient is alert and oriented. VS stable. No s/s of acute distress. Patient discharged to independent living, Nancy Blair. Reviewed patient education, medications and instructions with patient and son. Both state understanding. She is discharged with son to take her back.

## 2014-09-09 NOTE — Discharge Summary (Addendum)
Physician Discharge Summary  Nancy Blair VEH:[id number] DOB: 12-28-1935 DOA: 09/04/2014  PCP: Annye Asa, MD  Admit date: 09/04/2014 Discharge date: 09/09/2014  Recommendations for Outpatient Follow-up:  1. Continue vancomycin for next 13 days on discharge as prescribed for treatment of C. difficile colitis. 2. Continue Protonix twice daily and sucralfate as prescribed. 3. As noted, to not take Coumadin until seen by primary care physician at which time it will be decided was anticoagulation will be used and a fourth dose.  Discharge Diagnoses:  Active Problems:   Hypothyroidism   Lung transplant status, bilateral   Squamous cell carcinoma of leg   DVT of lower extremity (deep venous thrombosis)   Melena   Gastrointestinal hemorrhage with melena   Absolute anemia   C. difficile diarrhea    Discharge Condition: stable   Diet recommendation: as tolerated   History of present illness:  79 year old female with history of lung transplant on chronic immunosuppressive therapy, recent history of DVT on anticoagulation with Coumadin who presented to [facility] with melanotic stools, supratherapeutic INR and hemoglobin of 6.1.  Patient is status post EGD with findings of esophageal stricture at 32 cm with esophagitis, ulceration and mucosal tear, small island of probable Barrett's esophagus, prior Nissen fundoplication in the proximal stomach with recurrent hiatal hernia, mild antral gastropathy, sessile lipoid lesion in the second part of the duodenum with biopsies obtained.  Hospital Course:   Assessment/Plan:  Principal problem:  Acute upper GI bleed / acute blood loss anemia / anemia of chronic kidney disease  - Patient presented with melanotic stools in the setting of supratherapeutic INR. Patient is on Coumadin for anticoagulation because of recent diagnosis of DVT.  - Coumadin was held at the time of the admission.  - Patient underwent EGD with findings of  esophageal stricture at 32 cm with esophagitis, ulceration and mucosal tear, small island of probable Barrett's esophagus, prior Nissen fundoplication in the proximal stomach with recurrent hiatal hernia, mild antral gastropathy, sessile lipoid lesion in the second part of the duodenum with biopsies obtained. - Per GI recommendations, patient will continue Protonix twice daily, sucralfate 4 times a day and will have to follow-up with them about pathology results. - Coumadin will be on hold for next 2 weeks to allow for mucosal healing. At that time it will be decided which anticoagulation would be better for patient, most likely except on at the low dose per family request. - Hemoglobin is 8.1 this morning. No reports of bleeding. - Patient tolerates regular diet.  Active problems:  Acute on chronic kidney disease stage IV - Most recent baseline 3.1. - Creatinine is 2.97 prior to discharge. - Off note, patient is following with nephrology, does not wish to have dialysis because of overall poor health.  Coagulopathy/supratherapeutic INR - Secondary to Coumadin which was placed on hold at the time of the admission.  - Patient will continue to be off off blood thinners because of ulcerative esophagitis. She will follow-up with primary care physician in about 1-2 weeks and see when is it safe to resume anticoagulation and which type of anticoagulation.  C. difficile colitis - Immunosuppressed, on Bactrim chronically - Patient will continue vancomycin for total of 13 days at the time of discharge. - She will follow-up with primary care physician to decide when is it safe to get back on Bactrim.  Recent right lower extremity DVT - As mentioned above, anticoagulation on hold for at least another 2 weeks after discharge to allow  for mucosal healing. - IVC filter was placed on 09/07/2014.  History of lung transplant - Continue steroids, cyclosporine, CellCept.  - On valganciclovir Monday Wednesday  and Friday.  History of hypothyroidism - Continue levothyroxine  Essential hypertension - Continue Coreg, Imdur, amlodipine  Code status: DNR/DNI Family communication: update the family at the bedside   Signed:  Leisa Lenz, MD  [facility] 09/09/2014, 9:02 AM  Pager #: [phone]  Time spent in minutes: more than 30 minutes   Discharge Exam: Filed Vitals:   09/09/14 0525  BP: 143/66  Pulse: 76  Temp: 98.3 F (36.8 C)  Resp: 14   Filed Vitals:   09/08/14 1510 09/08/14 1524 09/08/14 2202 09/09/14 0525  BP: 110/54 107/60 157/67 143/66  Pulse: 68 69 74 76  Temp:  97.6 F (36.4 C) 98.3 F (36.8 C) 98.3 F (36.8 C)  TempSrc:  Oral Oral Oral  Resp: 10 12 14 14   Height:      Weight:      SpO2: 100% 98% 98% 100%    General: Pt is alert, follows commands appropriately, not in acute distress Cardiovascular: Regular rate and rhythm, S1/S2 + Respiratory: Clear to auscultation bilaterally, no wheezing, no crackles, no rhonchi Abdominal: Soft, non tender, non distended, bowel sounds +, no guarding Extremities: no edema, no cyanosis, pulses palpable bilaterally DP and PT Neuro: Grossly nonfocal  Discharge Instructions  Discharge Instructions    Call MD for:  difficulty breathing, headache or visual disturbances    Complete by:  As directed      Call MD for:  persistant nausea and vomiting    Complete by:  As directed      Call MD for:  severe uncontrolled pain    Complete by:  As directed      Diet - low sodium heart healthy    Complete by:  As directed      Discharge instructions    Complete by:  As directed   1. Continue vancomycin for next 13 days on discharge as prescribed for treatment of C. difficile colitis. 2. Continue Protonix twice daily and sucralfate as prescribed. 3. As noted, to not take Coumadin until seen by primary care physician at which time it will be decided was anticoagulation will be used and a fourth dose.     Increase activity  slowly    Complete by:  As directed             Medication List    STOP taking these medications        aspirin 81 MG chewable tablet     furosemide 20 MG tablet  Commonly known as:  LASIX     warfarin 2 MG tablet  Commonly known as:  COUMADIN      TAKE these medications        amLODipine 2.5 MG tablet  Commonly known as:  NORVASC  Take 1 tablet (2.5 mg total) by mouth daily.     carvedilol 6.25 MG tablet  Commonly known as:  COREG  Take 1 tablet (6.25 mg total) by mouth 2 (two) times daily with a meal.     citalopram 40 MG tablet  Commonly known as:  CELEXA  TAKE 1 TABLET BY MOUTH DAILY     cycloSPORINE modified 25 MG capsule  Commonly known as:  NEORAL  Take 125 mg by mouth 2 (two) times daily.     diphenoxylate-atropine 2.5-0.025 MG per tablet  Commonly known as:  LOMOTIL  Take 1-2  tablets by mouth See admin instructions. Take 2 tablets daily at lunch (2pm), may take 1 more tablet later in the day as needed for diarrhea     fenofibrate 160 MG tablet  TAKE ONE TABLET BY MOUTH EVERY DAY     ferrous sulfate 325 (65 FE) MG tablet  Take 325 mg by mouth daily.     hydrocortisone 20 MG tablet  Commonly known as:  CORTEF  Take 20 mg by mouth 2 (two) times daily.     isosorbide mononitrate 30 MG 24 hr tablet  Commonly known as:  IMDUR  Take 1 tablet (30 mg total) by mouth daily.     levothyroxine 88 MCG tablet  Commonly known as:  SYNTHROID, LEVOTHROID  Take 88 mcg by mouth daily before breakfast.     multivitamin with minerals Tabs tablet  Take 1 tablet by mouth daily.     mycophenolate 500 MG tablet  Commonly known as:  CELLCEPT  Take 500 mg by mouth 2 (two) times daily.     pantoprazole 40 MG tablet  Commonly known as:  PROTONIX  Take 1 tablet (40 mg total) by mouth 2 (two) times daily.     potassium chloride SA 20 MEQ tablet  Commonly known as:  K-DUR,KLOR-CON  Take 1 tablet (20 mEq total) by mouth daily.     pravastatin 20 MG tablet  Commonly  known as:  PRAVACHOL  TAKE 1 TABLET BY MOUTH EVERY NIGHT AT BEDTIME     sucralfate 1 GM/10ML suspension  Commonly known as:  CARAFATE  Take 10 mLs (1 g total) by mouth 4 (four) times daily -  with meals and at bedtime.     sulfamethoxazole-trimethoprim 800-160 MG per tablet  Commonly known as:  BACTRIM DS,SEPTRA DS  Take 1 tablet by mouth every Monday, Wednesday, and Friday.     valGANciclovir 450 MG tablet  Commonly known as:  VALCYTE  Take 450 mg by mouth every Monday, Wednesday, and Friday.     vancomycin 50 mg/mL oral solution  Commonly known as:  VANCOCIN  Take 2.5 mLs (125 mg total) by mouth 4 (four) times daily.             Follow-up Information    Follow up with Annye Asa, MD. Schedule an appointment as soon as possible for a visit in 1 week.   Specialty:  Family Medicine   Why:  Follow up appt after recent hospitalization   Contact information:   [address] [place] [address] [phone]        The results of significant diagnostics from this hospitalization (including imaging, microbiology, ancillary and laboratory) are listed below for reference.    Significant Diagnostic Studies: Ir Ivc Filter Plmt / S&i /img Guid/mod Sed  09/07/2014   CLINICAL DATA:  Lower extremity DVT. Exacerbation of lower GI bleed on anticoagulation, a relative contraindication. Caval filtration is requested. Renal insufficiency.  EXAM: INFERIOR VENACAVOGRAM  IVC FILTER PLACEMENT UNDER FLUOROSCOPY  FLUOROSCOPY TIME:  0.7 minutes, 153.79 uGym2 DAP  TECHNIQUE: The procedure, risks (including but not limited to bleeding, infection, organ damage ), benefits, and alternatives were explained to the patient. Questions regarding the procedure were encouraged and answered. The patient understands and consents to the procedure. Patency of the right IJ vein was confirmed with ultrasound with image documentation. An appropriate skin site was determined. Skin site was marked,  prepped with chlorhexidine, and draped using maximum barrier technique. The region was infiltrated locally  with 1% lidocaine.  Intravenous Fentanyl and Versed were administered as conscious sedation during continuous cardiorespiratory monitoring by the radiology RN, with a total moderate sedation time of 8 minutes.  Under real-time ultrasound guidance, the right IJ vein was accessed with a 21 gauge micropuncture needle; the needle tip within the vein was confirmed with ultrasound image documentation. The needle was exchanged over a 018 guidewire for a transitional dilator, which allow advancement of the [facility] wire into the IVC. A long 6 French vascular sheath was placed for inferior venacavography using CO2. This demonstrated no caval thrombus. Renal vein inflows were evident.  The [facility] IVC filter was advanced through the sheath and successfully deployed under fluoroscopy at the L1-2 level. Followup CO2 cavagram demonstrates stable filter position and no evident complication. The sheath was removed and hemostasis achieved at the site. No immediate complication.  IMPRESSION: 1. Normal IVC. No thrombus or significant anatomic variation. 2. Technically successful infrarenal IVC filter placement. This is a retrievable model.   Electronically Signed   By: Lucrezia Europe M.D.   On: 09/07/2014 16:36   US Venous Img Lower Unilateral Right  08/24/2014   CLINICAL DATA:  Varicose veins strip from both legs. History of bilateral lung transplant. Right lower leg swelling for 1 week.  EXAM: Right LOWER EXTREMITY VENOUS DOPPLER ULTRASOUND  TECHNIQUE: Gray-scale sonography with graded compression, as well as color Doppler and duplex ultrasound were performed to evaluate the lower extremity deep venous systems from the level of the common femoral vein and including the common femoral, femoral, profunda femoral, popliteal and calf veins including the posterior tibial, peroneal and gastrocnemius veins when visible. The superficial  great saphenous vein was also interrogated. Spectral Doppler was utilized to evaluate flow at rest and with distal augmentation maneuvers in the common femoral, femoral and popliteal veins.  COMPARISON:  None.  FINDINGS: There is hypoechoic, expansile thrombus throughout the right deep venous system, extending from the calf veins veins through the femoral vein into the common femoral vein. The majority of the thrombosis is complete/occlusive. Mobile luminal clot present at the level of the common femoral vein. The contralateral common femoral vein has normal respiratory phasicity and no evidence of thrombus.  The patient returned to ordering physician's office to await results in a wheelchair. This study is marked for technologist call report.  IMPRESSION: Diffuse acute right lower extremity deep venous thrombosis with clot from the calf into the common femoral vein. The majority of the thrombosis is occlusive, consider IR clinic referral for thrombolysis evaluation.   Electronically Signed   By: Monte Fantasia M.D.   On: 08/24/2014 13:29    Microbiology: Recent Results (from the past 240 hour(s))  MRSA PCR Screening     Status: None   Collection Time: 09/04/14  5:38 PM  Result Value Ref Range Status   MRSA by PCR NEGATIVE NEGATIVE Final    Comment:        The GeneXpert MRSA Assay (FDA approved for NASAL specimens only), is one component of a comprehensive MRSA colonization surveillance program. It is not intended to diagnose MRSA infection nor to guide or monitor treatment for MRSA infections.   Clostridium Difficile by PCR     Status: Abnormal   Collection Time: 09/06/14 10:00 PM  Result Value Ref Range Status   C difficile by pcr POSITIVE (A) NEGATIVE Final    Comment: CRITICAL RESULT CALLED TO, READ BACK BY AND VERIFIED WITH: J GOOD RN @ [phone] ON 09/07/14 BY C DAVIS  Labs: Basic Metabolic Panel:  Recent Labs Lab 09/04/14 1209 09/05/14 0500 09/06/14 0515 09/07/14 0530  09/08/14 0450  NA 142 143 141 143 141  K 4.3 3.9 3.9 3.8 3.2*  CL 112* 117* 117* 119* 113*  CO2 18* 16* 16* 18* 20*  GLUCOSE 109* 107* 204* 175* 124*  BUN 84* 67* 60* 50* 50*  CREATININE 3.06* 2.74* 2.87* 2.91* 2.97*  CALCIUM 8.4* 7.8* 7.5* 7.6* 7.6*   Liver Function Tests:  Recent Labs Lab 09/05/14 0500  AST 17  ALT 18  ALKPHOS 24*  BILITOT 1.7*  PROT 4.6*  ALBUMIN 2.4*   No results for input(s): LIPASE, AMYLASE in the last 168 hours. No results for input(s): AMMONIA in the last 168 hours. CBC:  Recent Labs Lab 09/04/14 1209  09/05/14 0500  09/05/14 2244 09/06/14 0515 09/07/14 0530 09/08/14 0450 09/09/14 0515  WBC 18.4*  --  14.7*  --   --  11.4* 8.2 7.5 7.4  NEUTROABS 16.5*  --   --   --   --   --   --   --   --   HGB 6.1*  < > 8.5*  < > 7.8* 8.2* 8.0* 7.9* 8.1*  HCT 19.3*  < > 25.6*  < > 23.3* 25.1* 24.5* 24.9* 24.5*  MCV 103.2*  --  96.2  --   --  96.2 96.8 96.5 95.7  PLT 278  --  177  --   --  192 214 210 237  < > = values in this interval not displayed. Cardiac Enzymes: No results for input(s): CKTOTAL, CKMB, CKMBINDEX, TROPONINI in the last 168 hours. BNP: BNP (last 3 results) No results for input(s): BNP in the last 8760 hours.  ProBNP (last 3 results) No results for input(s): PROBNP in the last 8760 hours.  CBG: No results for input(s): GLUCAP in the last 168 hours.

## 2014-09-09 NOTE — Discharge Instructions (Signed)
Esophagitis Esophagitis is inflammation of the esophagus. It can involve swelling, soreness, and pain in the esophagus. This condition can make it difficult and painful to swallow. CAUSES  Most causes of esophagitis are not serious. Many different factors can cause esophagitis, including:  Gastroesophageal reflux disease (GERD). This is when acid from your stomach flows up into the esophagus.  Recurrent vomiting.  An allergic-type reaction.  Certain medicines, especially those that come in large pills.  Ingestion of harmful chemicals, such as household cleaning products.  Heavy alcohol use.  An infection of the esophagus.  Radiation treatment for cancer.  Certain diseases such as sarcoidosis, Crohn's disease, and scleroderma. These diseases may cause recurrent esophagitis. SYMPTOMS   Trouble swallowing.  Painful swallowing.  Chest pain.  Difficulty breathing.  Nausea.  Vomiting.  Abdominal pain. DIAGNOSIS  Your caregiver will take your history and do a physical exam. Depending upon what your caregiver finds, certain tests may also be done, including:  Barium X-ray. You will drink a solution that coats the esophagus, and X-rays will be taken.  Endoscopy. A lighted tube is put down the esophagus so your caregiver can examine the area.  Allergy tests. These can sometimes be arranged through follow-up visits. TREATMENT  Treatment will depend on the cause of your esophagitis. In some cases, steroids or other medicines may be given to help relieve your symptoms or to treat the underlying cause of your condition. Medicines that may be recommended include:  Viscous lidocaine, to soothe the esophagus.  Antacids.  Acid reducers.  Proton pump inhibitors.  Antiviral medicines for certain viral infections of the esophagus.  Antifungal medicines for certain fungal infections of the esophagus.  Antibiotic medicines, depending on the cause of the esophagitis. HOME CARE  INSTRUCTIONS   Avoid foods and drinks that seem to make your symptoms worse.  Eat small, frequent meals instead of large meals.  Avoid eating for the 3 hours prior to your bedtime.  If you have trouble taking pills, use a pill splitter to decrease the size and likelihood of the pill getting stuck or injuring the esophagus on the way down. Drinking water after taking a pill also helps.  Stop smoking if you smoke.  Maintain a healthy weight.  Wear loose-fitting clothing. Do not wear anything tight around your waist that causes pressure on your stomach.  Raise the head of your bed 6 to 8 inches with wood blocks to help you sleep. Extra pillows will not help.  Only take over-the-counter or prescription medicines as directed by your caregiver. SEEK IMMEDIATE MEDICAL CARE IF:  You have severe chest pain that radiates into your arm, neck, or jaw.  You feel sweaty, dizzy, or lightheaded.  You have shortness of breath.  You vomit blood.  You have difficulty or pain with swallowing.  You have bloody or black, tarry stools.  You have a fever.  You have a burning sensation in the chest more than 3 times a week for more than 2 weeks.  You cannot swallow, drink, or eat.  You drool because you cannot swallow your saliva. MAKE SURE YOU:  Understand these instructions.  Will watch your condition.  Will get help right away if you are not doing well or get worse. Document Released: 05/11/2004 Document Revised: 06/26/2011 Document Reviewed: 12/02/2010 [facility] Patient Information 2015 [place], [place]. This information is not intended to replace advice given to you by your health care provider. Make sure you discuss any questions you have with your health care provider.  Clostridium Difficile Infection Clostridium difficile (C. difficile) is a bacteria found in the intestinal tract or colon. Under certain conditions, it causes diarrhea and sometimes severe disease. The severe form of the  disease is known as pseudomembranous colitis (often called C. difficile colitis). This disease can damage the lining of the colon or cause the colon to become enlarged (toxic megacolon). CAUSES Your colon normally contains many different bacteria, including C. difficile. The balance of bacteria in your colon can change during illness. This is especially true when you take antibiotic medicine. Taking antibiotics may allow the C. difficile to grow, multiply excessively, and make a toxin that then causes illness. The elderly and people with certain medical conditions have a greater risk of getting C. difficile infections. SYMPTOMS  Watery diarrhea.  Fever.  Fatigue.  Loss of appetite.  Nausea.  Abdominal swelling, pain, or tenderness.  Dehydration. DIAGNOSIS Your symptoms may make your caregiver suspect a C. difficile infection, especially if you have used antibiotics in the preceding weeks. However, there are only 2 ways to know for certain whether you have a C. difficile infection:  A lab test that finds the toxin in your stool.  The specific appearance of an abnormality (pseudomembrane) in your colon. This can only be seen by doing a sigmoidoscopy or colonoscopy. These procedures involve passing an instrument through your rectum to look at the inside of your colon. Your caregiver will help determine if these tests are necessary. TREATMENT  Most people are successfully treated with one of two specific antibiotics, usually given by mouth. Other antibiotics you are receiving are stopped if possible.  Intravenous (IV) fluids and correction of electrolyte imbalance may be necessary.  Rarely, surgery may be needed to remove the infected part of the intestines.  Careful hand washing by you and your caregivers is important to prevent the spread of infection. In the hospital, your caregivers may also put on gowns and gloves to prevent the spread of the C. difficile bacteria. Your room is also  cleaned regularly with a solution containing bleach or a product that is known to kill C. difficile. HOME CARE INSTRUCTIONS  Drink enough fluids to keep your urine clear or pale yellow. Avoid milk, caffeine, and alcohol.  Ask your caregiver for specific rehydration instructions.  Try eating small, frequent meals rather than large meals.  Take your antibiotics as directed. Finish them even if you start to feel better.  Do not use medicines to slow diarrhea. This could delay healing or cause complications.  Wash your hands thoroughly after using the bathroom and before preparing food.  Make sure people who live with you wash their hands often, too.  Carefully disinfect all surfaces with a product that contains chlorine bleach. SEEK MEDICAL CARE IF:  Diarrhea persists longer than expected or recurs after completing your course of antibiotic treatment for the C. difficile infection.  You have trouble staying hydrated. SEEK IMMEDIATE MEDICAL CARE IF:  You develop a new fever.  You have increasing abdominal pain or tenderness.  There is blood in your stools, or your stools are dark black and tarry.  You cannot hold down food or liquids. MAKE SURE YOU:  Understand these instructions.  Will watch your condition.  Will get help right away if you are not doing well or get worse. Document Released: 01/11/2005 Document Revised: 08/18/2013 Document Reviewed: 09/09/2010 [facility] Patient Information 2015 [place], [place]. This information is not intended to replace advice given to you by your health care provider. Make sure  you discuss any questions you have with your health care provider.

## 2014-09-10 NOTE — Telephone Encounter (Signed)
Message    Pt needs [facility] call and appt. I am concerned that they d/c'd her w/o any anticoagulation in the setting of a large clot. She is not a candidate for any other types of anticoagulation (other than coumadin) due to her kidney issues. Please refer her urgently to Hematology so they can manage her anticoagulation in the setting of GI bleed. Thanks!      ----- Message -----    From: Robbie Lis, MD    Sent: 09/09/2014  9:27 AM     To: Midge Minium, MD      Hematology referral placed.   Unable to reach patient at time of TCM Call.  Left message for patient to return call when available.

## 2014-09-11 DIAGNOSIS — F0390 Unspecified dementia without behavioral disturbance: Secondary | ICD-10-CM | POA: Diagnosis not present

## 2014-09-11 DIAGNOSIS — N184 Chronic kidney disease, stage 4 (severe): Secondary | ICD-10-CM | POA: Diagnosis not present

## 2014-09-11 DIAGNOSIS — D649 Anemia, unspecified: Secondary | ICD-10-CM | POA: Diagnosis not present

## 2014-09-11 DIAGNOSIS — Z9181 History of falling: Secondary | ICD-10-CM | POA: Diagnosis not present

## 2014-09-11 DIAGNOSIS — J449 Chronic obstructive pulmonary disease, unspecified: Secondary | ICD-10-CM | POA: Diagnosis not present

## 2014-09-11 DIAGNOSIS — R252 Cramp and spasm: Secondary | ICD-10-CM | POA: Diagnosis not present

## 2014-09-11 DIAGNOSIS — I82401 Acute embolism and thrombosis of unspecified deep veins of right lower extremity: Secondary | ICD-10-CM | POA: Diagnosis not present

## 2014-09-11 DIAGNOSIS — E039 Hypothyroidism, unspecified: Secondary | ICD-10-CM | POA: Diagnosis not present

## 2014-09-11 DIAGNOSIS — I120 Hypertensive chronic kidney disease with stage 5 chronic kidney disease or end stage renal disease: Secondary | ICD-10-CM | POA: Diagnosis not present

## 2014-09-11 LAB — CYCLOSPORINE: Cyclosporine, LabCorp: 161 ng/mL (ref 100–400)

## 2014-09-11 NOTE — Addendum Note (Signed)
Addended by: Leticia Penna A on: 09/11/2014 01:14 PM   Modules accepted: Medications

## 2014-09-11 NOTE — Telephone Encounter (Signed)
Transition Care Management Follow-up Telephone Call Spoke with daughter, Nancy Blair    How have you been since you were released from the hospital? YES    Do you understand why you were in the hospital? YES    Do you understand the discharge instrcutions? YES   Items Reviewed:  Medications reviewed: YES  Allergies reviewed: YES   Dietary changes reviewed: YES   Referrals reviewed: YES- patient has already gone to GI referral and has cardiology appointment scheduled    Functional Questionnaire:  Activities of Daily Living (ADLs):   She states they are independent in the following: dressing, restroom States they require assistance with the following: transportation, medications- patient has home health RN and an aide as well as daughter helping    Any transportation issues/concerns?: NO   Any patient concerns?  YES- [facility] should be sending a dementia assessment to Dr. [name] and they would like help with dementia medications- daughter feels that it is getting worse and is contributing to other problems    Confirmed importance and date/time of follow-up visits scheduled: YES- scheduled 09/21/14 with Dr. [name] 09/21/14.    Confirmed with patient if condition begins to worsen call PCP or go to the ER.  Patient was given the Call-a-Nurse line [phone]: YES

## 2014-09-11 NOTE — Telephone Encounter (Signed)
Unable to reach patient at time of TCM Call. Left message for patient to return call when available.  

## 2014-09-15 DIAGNOSIS — F0390 Unspecified dementia without behavioral disturbance: Secondary | ICD-10-CM | POA: Diagnosis not present

## 2014-09-15 DIAGNOSIS — D649 Anemia, unspecified: Secondary | ICD-10-CM | POA: Diagnosis not present

## 2014-09-15 DIAGNOSIS — Z9181 History of falling: Secondary | ICD-10-CM | POA: Diagnosis not present

## 2014-09-15 DIAGNOSIS — R252 Cramp and spasm: Secondary | ICD-10-CM | POA: Diagnosis not present

## 2014-09-15 DIAGNOSIS — I120 Hypertensive chronic kidney disease with stage 5 chronic kidney disease or end stage renal disease: Secondary | ICD-10-CM | POA: Diagnosis not present

## 2014-09-15 DIAGNOSIS — E039 Hypothyroidism, unspecified: Secondary | ICD-10-CM | POA: Diagnosis not present

## 2014-09-15 DIAGNOSIS — J449 Chronic obstructive pulmonary disease, unspecified: Secondary | ICD-10-CM | POA: Diagnosis not present

## 2014-09-15 DIAGNOSIS — I82401 Acute embolism and thrombosis of unspecified deep veins of right lower extremity: Secondary | ICD-10-CM | POA: Diagnosis not present

## 2014-09-15 DIAGNOSIS — N184 Chronic kidney disease, stage 4 (severe): Secondary | ICD-10-CM | POA: Diagnosis not present

## 2014-09-16 ENCOUNTER — Telehealth: Payer: Self-pay | Admitting: Family Medicine

## 2014-09-16 MED ORDER — FUROSEMIDE 20 MG PO TABS: 20.0000 mg | ORAL_TABLET | ORAL | Status: DC | PRN

## 2014-09-16 NOTE — Telephone Encounter (Signed)
If pt is weak, she needs appt for evaluation.  I do not just want to draw labs w/o knowing what I'm checking.

## 2014-09-16 NOTE — Telephone Encounter (Signed)
Noted.  Agree w/ advice given 

## 2014-09-16 NOTE — Telephone Encounter (Signed)
Rx for PRN Lasix 20mg  sent and [name], home health nurse notified- she states she is concerned that the patient is weak and would like to know if MD would like any labs drawn?  Please advise.

## 2014-09-16 NOTE — Telephone Encounter (Signed)
Caller name: Jackelyn Poling [place] from [place] Relation to pt: Call back number: [phone] Pharmacy:  Reason for call:   States that patient has had three falls in the past few days. Has new bruising and abrasions. Patient is beginning to have ankle swelling, was taken on lasix when she was discharged from hospital.

## 2014-09-16 NOTE — Telephone Encounter (Signed)
Can you please call and triage pt: When were the falls? How severe? Any injuries besides the bruising?

## 2014-09-16 NOTE — Telephone Encounter (Signed)
Notified [name], home health nurse, who stated understanding and agreed that if symptoms become worse will take to ED.  [name] plans to check on patient tomorrow as well.

## 2014-09-16 NOTE — Telephone Encounter (Signed)
[place] states patient fell once on Monday and twice yesterday- All falls were witnessed by family and yesterday's fall was witnessed by [name].  The patient states that her pants were too long and caused her to trip.  She denies dizziness, BP was elevated after the fall but SpO2 was within normal range.  Per Home Health nurse, she has two new skin tears, one on elbow and one on knee which are dressed.  She does have 1+ edema per [name] bilateral in ankles.  No shortness of breath.  Her left ankle is sore but she is able to walk on it.    Spoke with daughter, [name], and offered earlier appointment, but Nancy Blair is having a baby tomorrow, so they can not bring patient in sooner.  Per [name], she has PRN Lasix she can give Nancy Blair and she will take her to ED if she becomes short of breath or any symptoms worsen.

## 2014-09-17 DIAGNOSIS — I82401 Acute embolism and thrombosis of unspecified deep veins of right lower extremity: Secondary | ICD-10-CM | POA: Diagnosis not present

## 2014-09-18 ENCOUNTER — Telehealth: Payer: Self-pay | Admitting: Family

## 2014-09-18 DIAGNOSIS — I82401 Acute embolism and thrombosis of unspecified deep veins of right lower extremity: Secondary | ICD-10-CM | POA: Diagnosis not present

## 2014-09-18 DIAGNOSIS — J449 Chronic obstructive pulmonary disease, unspecified: Secondary | ICD-10-CM | POA: Diagnosis not present

## 2014-09-18 DIAGNOSIS — F0390 Unspecified dementia without behavioral disturbance: Secondary | ICD-10-CM | POA: Diagnosis not present

## 2014-09-18 DIAGNOSIS — Z9181 History of falling: Secondary | ICD-10-CM | POA: Diagnosis not present

## 2014-09-18 DIAGNOSIS — E039 Hypothyroidism, unspecified: Secondary | ICD-10-CM | POA: Diagnosis not present

## 2014-09-18 DIAGNOSIS — D649 Anemia, unspecified: Secondary | ICD-10-CM | POA: Diagnosis not present

## 2014-09-18 DIAGNOSIS — I120 Hypertensive chronic kidney disease with stage 5 chronic kidney disease or end stage renal disease: Secondary | ICD-10-CM | POA: Diagnosis not present

## 2014-09-18 DIAGNOSIS — R252 Cramp and spasm: Secondary | ICD-10-CM | POA: Diagnosis not present

## 2014-09-18 DIAGNOSIS — N184 Chronic kidney disease, stage 4 (severe): Secondary | ICD-10-CM | POA: Diagnosis not present

## 2014-09-18 NOTE — Telephone Encounter (Signed)
Lt mess regarding new pt  appt for 10/05/14 at 1pm

## 2014-09-19 ENCOUNTER — Other Ambulatory Visit: Payer: Self-pay | Admitting: Family Medicine

## 2014-09-20 ENCOUNTER — Encounter: Payer: Self-pay | Admitting: Cardiology

## 2014-09-20 DIAGNOSIS — Z95828 Presence of other vascular implants and grafts: Secondary | ICD-10-CM | POA: Insufficient documentation

## 2014-09-21 ENCOUNTER — Telehealth: Payer: Self-pay | Admitting: Family

## 2014-09-21 ENCOUNTER — Ambulatory Visit (HOSPITAL_COMMUNITY): Payer: Medicare Other

## 2014-09-21 ENCOUNTER — Ambulatory Visit (INDEPENDENT_AMBULATORY_CARE_PROVIDER_SITE_OTHER): Payer: Medicare Other | Admitting: Family Medicine

## 2014-09-21 ENCOUNTER — Other Ambulatory Visit (HOSPITAL_COMMUNITY): Payer: Medicare Other

## 2014-09-21 ENCOUNTER — Ambulatory Visit: Payer: Medicare Other | Admitting: Family Medicine

## 2014-09-21 ENCOUNTER — Ambulatory Visit (INDEPENDENT_AMBULATORY_CARE_PROVIDER_SITE_OTHER): Payer: Medicare Other | Admitting: Cardiology

## 2014-09-21 ENCOUNTER — Encounter: Payer: Self-pay | Admitting: Family Medicine

## 2014-09-21 ENCOUNTER — Encounter: Payer: Self-pay | Admitting: Cardiology

## 2014-09-21 VITALS — BP 200/108 | HR 78 | Ht 60.0 in | Wt 134.0 lb

## 2014-09-21 VITALS — BP 188/102 | HR 90 | Temp 97.9°F | Resp 16 | Wt 134.5 lb

## 2014-09-21 DIAGNOSIS — F341 Dysthymic disorder: Secondary | ICD-10-CM

## 2014-09-21 DIAGNOSIS — A047 Enterocolitis due to Clostridium difficile: Secondary | ICD-10-CM

## 2014-09-21 DIAGNOSIS — I350 Nonrheumatic aortic (valve) stenosis: Secondary | ICD-10-CM | POA: Diagnosis not present

## 2014-09-21 DIAGNOSIS — A0472 Enterocolitis due to Clostridium difficile, not specified as recurrent: Secondary | ICD-10-CM

## 2014-09-21 DIAGNOSIS — I10 Essential (primary) hypertension: Secondary | ICD-10-CM

## 2014-09-21 DIAGNOSIS — Z95828 Presence of other vascular implants and grafts: Secondary | ICD-10-CM

## 2014-09-21 DIAGNOSIS — E119 Type 2 diabetes mellitus without complications: Secondary | ICD-10-CM | POA: Diagnosis not present

## 2014-09-21 DIAGNOSIS — Z9889 Other specified postprocedural states: Secondary | ICD-10-CM

## 2014-09-21 DIAGNOSIS — N184 Chronic kidney disease, stage 4 (severe): Secondary | ICD-10-CM

## 2014-09-21 DIAGNOSIS — K921 Melena: Secondary | ICD-10-CM

## 2014-09-21 DIAGNOSIS — I82401 Acute embolism and thrombosis of unspecified deep veins of right lower extremity: Secondary | ICD-10-CM

## 2014-09-21 DIAGNOSIS — Z942 Lung transplant status: Secondary | ICD-10-CM

## 2014-09-21 DIAGNOSIS — C44320 Squamous cell carcinoma of skin of unspecified parts of face: Secondary | ICD-10-CM | POA: Diagnosis not present

## 2014-09-21 DIAGNOSIS — D509 Iron deficiency anemia, unspecified: Secondary | ICD-10-CM | POA: Diagnosis not present

## 2014-09-21 MED ORDER — BUSPIRONE HCL 15 MG PO TABS: 15.0000 mg | ORAL_TABLET | Freq: Two times a day (BID) | ORAL | Status: AC

## 2014-09-21 NOTE — Assessment & Plan Note (Signed)
Chronic problem.  [facility] is recommending additional tx for anxiety/depression.  Daughter is asking to restart Buspar as pt has done well w/ this in the past.  Will add Buspar to pt's current Celexa.  Will continue to follow.

## 2014-09-21 NOTE — Assessment & Plan Note (Signed)
She had recent GI bleeding and she has a polyp that will need further follow-up.

## 2014-09-21 NOTE — Patient Instructions (Signed)
Follow up in 3-6 weeks to recheck depression Continue the Celexa Add the Buspar- start w/ 1/2 tab twice daily x2 weeks and then increase to 1 tab twice daily Ask Dr C about the Eliquis 2.5mg  daily and let me know We'll call you with your dermatology appt- please go! Palliative care will get in touch w/ you about setting up services Let the physical therapists work w/ you! Call with any questions or concerns Hang in there!!!

## 2014-09-21 NOTE — Assessment & Plan Note (Addendum)
The patient's aortic stenosis was moderately severe last year. Initially we had planned to proceed with an echo today. After careful review of all of her issues and after speaking with her family privately, I feel that it is not appropriate to proceed with an echo at this time. She is not having chest pain or shortness of breath. There is no syncope.   As part of today's evaluation I have spent an extensive amount of time reviewing her recent hospital data to fully understand her overall status. In addition I spent greater than 25 minutes today with her total care in person. More than half of this time has been with direct discussion with the patient and her family about all of the issues.

## 2014-09-21 NOTE — Assessment & Plan Note (Signed)
She had a recent DVT while traveling. She then received Coumadin. After that time she had GI bleeding and her Coumadin was stopped. Decisions about restarting her Coumadin will be up to primary care and her GI team. There is an IVC filter in place.

## 2014-09-21 NOTE — Assessment & Plan Note (Signed)
New to provider, dx'd during recent hospitalization.  Pt is completing her course of PO vanc.  Son reports no additional diarrhea this weekend.  Will follow.

## 2014-09-21 NOTE — Assessment & Plan Note (Signed)
Ongoing issue for pt.  She had [place] filter placed during recent hospitalization due to GI bleed and supratherapeutic coumadin.  She has been of anticoagulation since admission on 5/20.  Daughter is interested in starting pt on Eliquis 2.5mg  for anticoagulation.  Cannot do this w/o nephrology's input and agreement.  Pt and family to discuss w/ Nephrology later today.

## 2014-09-21 NOTE — Assessment & Plan Note (Signed)
Chronic problem.  Pt and family are not interested in dialysis when the time comes.  They are more interested in palliative measures.  Will refer to palliative care and defer to Dr Arty Baumgartner.

## 2014-09-21 NOTE — Assessment & Plan Note (Signed)
Chronic problem.  Pt has hx of hypotension and she just took her medication so I hesitate to adjust medication based on today's readings.  Pt reports she is currently asymptomatic.  Will follow.

## 2014-09-21 NOTE — Progress Notes (Signed)
Pre visit review using our clinic review tool, if applicable. No additional management support is needed unless otherwise documented below in the visit note. 

## 2014-09-21 NOTE — Progress Notes (Signed)
Cardiology Office Note   Date:  09/21/2014   ID:  Nancy Blair, Alferd Apa December 29, 1935, MRN [id number]  PCP:  Annye Asa, MD  Cardiologist:  Dola Argyle, MD   Chief Complaint  Patient presents with  . Appointment    Follow-up aortic stenosis      History of Present Illness: Nancy Blair is a 79 y.o. female who presents today to follow-up aortic stenosis. She has a complex history.  Most recently in early May, 2016, the patient traveled and then had DVT. She was treated with Coumadin. Unfortunately she presented with GI bleeding with an INR of 6. Upper endoscopy was done showing several findings. It is my understanding one of the findings included a polyp that was biopsied. There is ongoing follow-up as to how to approach this polyp after the pathology is back. Because she presented with GI bleeding, Coumadin had to be stopped despite her DVT. An IVC filter was placed. The timing of resumption of her Coumadin will be up to primary care and GI based on the stability of her GI tract.  Other problems include stage IV chronic kidney disease, recent C. difficile colitis that is being treated with vancomycin, history of lung transplant with antirejection medications. It is my understanding that the patient is followed by nephrology. At this time dialysis is not planned because of overall poor health.  History the patient has normal left regular function. There is aortic stenosis. In 2011 and S1 was mild. Echo in 2015 showed progression of her aortic stenosis. It was moderate at that time. Therefore she is seen for follow-up. Despite her multitude of other medical problems, she is not having chest pain or shortness of breath. There's been no syncope.  Additional problems include dementia and significant depression. The family tells me today that everyone is leaning towards not being overly aggressive.    Past Medical History  Diagnosis Date  . Emphysema of lung   . Hypertension   .  Hyperlipidemia   . Allergy   . Anemia   . GERD (gastroesophageal reflux disease)   . Adrenal insufficiency   . Squamous acanthoma of skin 06/2011    rt leg  . Hypothyroidism   . CKD (chronic kidney disease) stage 4, GFR 15-29 ml/min     since 2012 (per [facility] records)  . Lung transplant status, bilateral   . Acute on chronic diastolic heart failure   . Ejection fraction   . Diabetes mellitus without complication   . DVT (deep venous thrombosis) 08/2014    RT LEG    Past Surgical History  Procedure Laterality Date  . Appendectomy    . Abdominal hysterectomy    . Tonsillectomy    . Total lung replacement  2012    b/l lung transplant  . Esophagogastroduodenoscopy  05/26/2011    Procedure: ESOPHAGOGASTRODUODENOSCOPY (EGD);  Surgeon: Owens Loffler, MD;  Location: [facility];  Service: Endoscopy;  Laterality: N/A;  . Nissen fundoplication    . Esophagogastroduodenoscopy N/A 09/08/2014    Procedure: ESOPHAGOGASTRODUODENOSCOPY (EGD);  Surgeon: Jerene Bears, MD;  Location: [facility];  Service: Endoscopy;  Laterality: N/A;    Patient Active Problem List   Diagnosis Date Noted  . S/P IVC filter 09/20/2014  . C. difficile diarrhea   . Gastrointestinal hemorrhage with melena   . Lower leg edema 08/24/2014  . Fall in elderly patient 08/24/2014  . Skin tear of right lower leg without complication 70/48/8891  . Dementia 08/24/2014  . Right leg  DVT 08/24/2014  . Diarrhea 04/29/2014  . Gallstones 04/29/2014  . Transaminitis 04/29/2014  . GERD (gastroesophageal reflux disease) 08/11/2013  . CKD (chronic kidney disease) stage 4, GFR 15-29 ml/min 08/11/2013  . Ejection fraction   . Chronic diastolic heart failure   . Aortic stenosis 07/17/2013  . SOB (shortness of breath) 07/16/2013  . Hypoxia 07/16/2013  . Fatigue 07/19/2012  . Tremor 07/19/2012  . Long term current use of systemic steroids 06/25/2012  . Orthostatic hypotension 02/07/2012  . Syncopal episodes 02/06/2012  . Hip pain  01/10/2012  . ACI (adrenal cortical insufficiency) 11/09/2011  . Epidermoid carcinoma 11/09/2011  . Dizziness 09/02/2011  . Squamous cell carcinoma of leg 07/13/2011  . Anemia 05/26/2011  . Infected wound 04/27/2011  . Lung transplant status, bilateral 04/27/2011  . Polypharmacy 01/31/2011  . Positive culture findings in sputum 12/07/2010  . OSTEOPENIA 09/15/2009  . TOBACCO USE, QUIT 02/09/2009  . VITAMIN D DEFICIENCY 09/09/2008  . URINARY FREQUENCY 09/09/2008  . UNS ADVRS EFF OTH RX MEDICINAL&BIOLOGICAL SBSTNC 09/09/2008  . HYPOKALEMIA 07/22/2008  . LEG CRAMPS, NOCTURNAL 07/22/2008  . Hypothyroidism 06/30/2008  . ANXIETY DEPRESSION 06/30/2008  . Hyperlipidemia 02/08/2008  . Essential hypertension 12/24/2006      Current Outpatient Prescriptions  Medication Sig Dispense Refill  . amLODipine (NORVASC) 2.5 MG tablet Take 1 tablet (2.5 mg total) by mouth daily. (Patient taking differently: Take 2.5 mg by mouth daily at 10 pm. ) 30 tablet 6  . carvedilol (COREG) 6.25 MG tablet Take 1 tablet (6.25 mg total) by mouth 2 (two) times daily with a meal. 60 tablet 1  . citalopram (CELEXA) 40 MG tablet TAKE 1 TABLET BY MOUTH DAILY (Patient taking differently: TAKE 1 TABLET BY MOUTH DAILY AT BEDTIME) 30 tablet 3  . cycloSPORINE modified (NEORAL) 25 MG capsule Take 125 mg by mouth 2 (two) times daily.     . diphenoxylate-atropine (LOMOTIL) 2.5-0.025 MG per tablet Take 1-2 tablets by mouth See admin instructions. Take 2 tablets daily at lunch (2pm), may take 1 more tablet later in the day as needed for diarrhea    . fenofibrate 160 MG tablet TAKE ONE TABLET BY MOUTH EVERY DAY (Patient taking differently: TAKE ONE TABLET BY MOUTH DAILY AT BEDTIME) 90 tablet 1  . ferrous sulfate 325 (65 FE) MG tablet Take 325 mg by mouth daily.     . furosemide (LASIX) 20 MG tablet Take 1 tablet (20 mg total) by mouth as needed for edema. 30 tablet 0  . hydrocortisone (CORTEF) 20 MG tablet Take 20 mg by mouth 2  (two) times daily.     . isosorbide mononitrate (IMDUR) 30 MG 24 hr tablet Take 1 tablet (30 mg total) by mouth daily. 90 tablet 0  . levothyroxine (SYNTHROID, LEVOTHROID) 88 MCG tablet Take 88 mcg by mouth daily before breakfast.    . Multiple Vitamin (MULITIVITAMIN WITH MINERALS) TABS Take 1 tablet by mouth daily.    . mycophenolate (CELLCEPT) 500 MG tablet Take 500 mg by mouth 2 (two) times daily.     . pantoprazole (PROTONIX) 40 MG tablet Take 1 tablet (40 mg total) by mouth 2 (two) times daily. 60 tablet 1  . potassium chloride SA (K-DUR,KLOR-CON) 20 MEQ tablet Take 1 tablet (20 mEq total) by mouth daily. 5 tablet 0  . pravastatin (PRAVACHOL) 20 MG tablet TAKE 1 TABLET BY MOUTH EVERY NIGHT AT BEDTIME 30 tablet 4  . sucralfate (CARAFATE) 1 GM/10ML suspension Take 10 mLs (1 g total) by mouth  4 (four) times daily -  with meals and at bedtime. 420 mL 0  . sulfamethoxazole-trimethoprim (BACTRIM DS,SEPTRA DS) 800-160 MG per tablet Take 1 tablet by mouth every Monday, Wednesday, and Friday.    . valGANciclovir (VALCYTE) 450 MG tablet Take 450 mg by mouth every Monday, Wednesday, and Friday.     . vancomycin (VANCOCIN) 50 mg/mL oral solution Take 2.5 mLs (125 mg total) by mouth 4 (four) times daily. 130 mL 0   No current facility-administered medications for this visit.    Allergies:   Grapefruit extract; Oysters; Pineapple; Bupropion; Neomycin-bacitracin zn-polymyx; Penicillins; and Prednisone    Social History:  The patient  reports that she quit smoking about 21 years ago. She started smoking about 62 years ago. She has never used smokeless tobacco. She reports that she does not drink alcohol or use illicit drugs.   Family History:  The patient's family history includes Heart disease in her father and mother; Hypertension in her father and mother; Pancreatic cancer in her sister. There is no history of Colon cancer or Malignant hyperthermia.    ROS:  Please see the history of present illness.         PHYSICAL EXAM: VS:  There were no vitals taken for this visit. ,   EKG:      Recent Labs: 04/27/2014: Magnesium 2.4 05/13/2014: TSH 6.35* 09/05/2014: ALT 18 09/08/2014: BUN 50*; Creatinine 2.97*; Potassium 3.2*; Sodium 141 09/09/2014: Hemoglobin 8.1*; Platelets 237    Lipid Panel    Component Value Date/Time   CHOL 171 02/02/2014 1358   TRIG 263.0* 02/02/2014 1358   HDL 45.80 02/02/2014 1358   CHOLHDL 4 02/02/2014 1358   VLDL 52.6* 02/02/2014 1358   LDLCALC 43 09/05/2013 1337   LDLDIRECT 87.3 02/02/2014 1358      Wt Readings from Last 3 Encounters:  09/05/14 136 lb 11 oz (62 kg)  08/28/14 137 lb 14.4 oz (62.551 kg)  05/01/14 134 lb 9.6 oz (61.054 kg)      Current medicines are reviewed       ASSESSMENT AND PLAN:

## 2014-09-21 NOTE — Assessment & Plan Note (Signed)
Chronic problem.  Saw Cardiology this AM.  As pt is pursuing palliative approach to care, Cards decided to defer repeat ECHO.  Will follow.

## 2014-09-21 NOTE — Assessment & Plan Note (Signed)
Dimension also plays a role in the overall decision making.

## 2014-09-21 NOTE — Progress Notes (Signed)
   Subjective:    Patient ID: Nancy Blair, female    DOB: [birth year], 79 y.o.   MRN: [id number]  [facility] f/u- pt was admitted 5/20-5/25 w/ GI bleed and acute anemia due to supratherapeutic coumadin for DVT tx.  Pt was d/c'd home on Protonix BID and Carafate 4x/day.  She has been off coumadin x2 weeks and had IVC filter placed on 5/23.  She is not a candidate for novel anticoagulant due to renal insufficiency.  Pt was also found to have C diff and is now completing course of Vanc.  Son reports no diarrhea this weekend.  BP is very elevated today but she just took meds prior to arrival- typically takes meds at 10 am.  No CP, SOB above baseline, no additional bleeding, no N/V/D.  Daughter is interested in palliative care for mom as she is not interested in dialysis for her renal failure.  [facility] recommended treating her depression more aggressively to improve mood and energy level.  Daughter is a Marine scientist at vascular group in [place] and wants to know if mom can take Eliquis 2.5mg  off label rather than go back on coumadin.   Review of Systems For ROS see HPI     Objective:   Physical Exam  Constitutional: She is oriented to person, place, and time. She appears well-developed and well-nourished. No distress.  HENT:  Head: Normocephalic and atraumatic.  Eyes: Conjunctivae and EOM are normal. Pupils are equal, round, and reactive to light.  Neck: Normal range of motion. Neck supple. No thyromegaly present.  Cardiovascular: Normal rate, regular rhythm and intact distal pulses.   Murmur (II/VI SEM at RUSB) heard. Pulmonary/Chest: Effort normal and breath sounds normal. No respiratory distress.  Abdominal: Soft. She exhibits no distension. There is no tenderness.  Musculoskeletal: She exhibits edema (trace edema of LEs bilaterally).  Lymphadenopathy:    She has no cervical adenopathy.  Neurological: She is alert and oriented to person, place, and time.  Skin: Skin is warm and dry.  2 cm skin cancer  on pt's R cheek  Psychiatric: She has a normal mood and affect. Her behavior is normal.  Vitals reviewed.         Assessment & Plan:

## 2014-09-21 NOTE — Telephone Encounter (Signed)
Med filled.  

## 2014-09-21 NOTE — Patient Instructions (Signed)
Medication Instructions:  Same-no change  Labwork: None  Testing/Procedures: None  Follow-Up: Your physician wants you to follow-up in: 1 year. You will receive a reminder letter in the mail two months in advance. If you don't receive a letter, please call our office to schedule the follow-up appointment.      

## 2014-09-21 NOTE — Assessment & Plan Note (Signed)
She has significant renal insufficiency. Dialysis is not going to be considered.

## 2014-09-21 NOTE — Telephone Encounter (Signed)
Lt mess regarding 6/20 np appt.

## 2014-09-21 NOTE — Assessment & Plan Note (Signed)
New.  Pt w/ rapidly growing cancer on her face.  Needs derm appt ASAP.  Referral entered.

## 2014-09-21 NOTE — Assessment & Plan Note (Signed)
My understanding from the family is that this plays a major role.

## 2014-09-22 ENCOUNTER — Other Ambulatory Visit: Payer: Self-pay | Admitting: General Practice

## 2014-09-22 MED ORDER — PRAVASTATIN SODIUM 20 MG PO TABS: 20.0000 mg | ORAL_TABLET | Freq: Every day | ORAL | Status: AC

## 2014-09-22 MED ORDER — AMLODIPINE BESYLATE 2.5 MG PO TABS: 2.5000 mg | ORAL_TABLET | Freq: Every day | ORAL | Status: DC

## 2014-09-22 MED ORDER — CITALOPRAM HYDROBROMIDE 40 MG PO TABS: 40.0000 mg | ORAL_TABLET | Freq: Every day | ORAL | Status: DC

## 2014-09-23 ENCOUNTER — Telehealth: Payer: Self-pay | Admitting: General Practice

## 2014-09-23 ENCOUNTER — Other Ambulatory Visit: Payer: Self-pay | Admitting: Family Medicine

## 2014-09-23 MED ORDER — SULFAMETHOXAZOLE-TRIMETHOPRIM 800-160 MG PO TABS: 1.0000 | ORAL_TABLET | ORAL | Status: DC

## 2014-09-23 MED ORDER — APIXABAN 2.5 MG PO TABS: 2.5000 mg | ORAL_TABLET | Freq: Every day | ORAL | Status: AC

## 2014-09-23 NOTE — Telephone Encounter (Signed)
Med filled.  

## 2014-09-23 NOTE — Telephone Encounter (Signed)
Received a fax from [facility] advising that they would like for pt to be on Eliquis 2.5mg  daily. This medication was filled and papers sent to scanning.

## 2014-09-23 NOTE — Telephone Encounter (Signed)
Last OV 09-21-14 Bactrim was last filled by [facility], pt advised they were holding this medication due to vancomycin therapy. Notes in EPIC on historical med list is that pt is to stop taking at discharge.   Please advise?

## 2014-09-23 NOTE — Telephone Encounter (Signed)
Ok to refill but pt should not restart until after she has finished Radio broadcast assistant

## 2014-09-24 ENCOUNTER — Telehealth: Payer: Self-pay | Admitting: *Deleted

## 2014-09-24 DIAGNOSIS — I120 Hypertensive chronic kidney disease with stage 5 chronic kidney disease or end stage renal disease: Secondary | ICD-10-CM | POA: Diagnosis not present

## 2014-09-24 DIAGNOSIS — R252 Cramp and spasm: Secondary | ICD-10-CM | POA: Diagnosis not present

## 2014-09-24 DIAGNOSIS — D649 Anemia, unspecified: Secondary | ICD-10-CM | POA: Diagnosis not present

## 2014-09-24 DIAGNOSIS — J449 Chronic obstructive pulmonary disease, unspecified: Secondary | ICD-10-CM | POA: Diagnosis not present

## 2014-09-24 DIAGNOSIS — F0390 Unspecified dementia without behavioral disturbance: Secondary | ICD-10-CM | POA: Diagnosis not present

## 2014-09-24 DIAGNOSIS — Z9181 History of falling: Secondary | ICD-10-CM | POA: Diagnosis not present

## 2014-09-24 DIAGNOSIS — N184 Chronic kidney disease, stage 4 (severe): Secondary | ICD-10-CM | POA: Diagnosis not present

## 2014-09-24 DIAGNOSIS — I82401 Acute embolism and thrombosis of unspecified deep veins of right lower extremity: Secondary | ICD-10-CM | POA: Diagnosis not present

## 2014-09-24 DIAGNOSIS — E039 Hypothyroidism, unspecified: Secondary | ICD-10-CM | POA: Diagnosis not present

## 2014-09-24 NOTE — Telephone Encounter (Signed)
Prior authorization for Eliquis initiated. Awaiting determination. [id number]

## 2014-09-25 ENCOUNTER — Ambulatory Visit: Payer: Medicare Other | Admitting: Family Medicine

## 2014-09-28 ENCOUNTER — Inpatient Hospital Stay (HOSPITAL_COMMUNITY)
Admission: EM | Admit: 2014-09-28 | Discharge: 2014-09-29 | DRG: 291 | Disposition: A | Discharge status: Home / Self Care | Payer: Medicare Other | Attending: Internal Medicine | Admitting: Internal Medicine

## 2014-09-28 ENCOUNTER — Other Ambulatory Visit (HOSPITAL_COMMUNITY): Payer: Self-pay

## 2014-09-28 ENCOUNTER — Encounter (HOSPITAL_COMMUNITY): Payer: Self-pay | Admitting: *Deleted

## 2014-09-28 ENCOUNTER — Other Ambulatory Visit: Payer: Self-pay

## 2014-09-28 ENCOUNTER — Emergency Department (HOSPITAL_COMMUNITY): Payer: Medicare Other

## 2014-09-28 DIAGNOSIS — Z7982 Long term (current) use of aspirin: Secondary | ICD-10-CM

## 2014-09-28 DIAGNOSIS — I160 Hypertensive urgency: Secondary | ICD-10-CM | POA: Insufficient documentation

## 2014-09-28 DIAGNOSIS — Z942 Lung transplant status: Secondary | ICD-10-CM

## 2014-09-28 DIAGNOSIS — D631 Anemia in chronic kidney disease: Secondary | ICD-10-CM | POA: Diagnosis present

## 2014-09-28 DIAGNOSIS — Z7952 Long term (current) use of systemic steroids: Secondary | ICD-10-CM

## 2014-09-28 DIAGNOSIS — J9601 Acute respiratory failure with hypoxia: Secondary | ICD-10-CM | POA: Diagnosis present

## 2014-09-28 DIAGNOSIS — I129 Hypertensive chronic kidney disease with stage 1 through stage 4 chronic kidney disease, or unspecified chronic kidney disease: Secondary | ICD-10-CM | POA: Diagnosis present

## 2014-09-28 DIAGNOSIS — K219 Gastro-esophageal reflux disease without esophagitis: Secondary | ICD-10-CM | POA: Diagnosis not present

## 2014-09-28 DIAGNOSIS — Z515 Encounter for palliative care: Secondary | ICD-10-CM | POA: Diagnosis not present

## 2014-09-28 DIAGNOSIS — Z86718 Personal history of other venous thrombosis and embolism: Secondary | ICD-10-CM | POA: Diagnosis not present

## 2014-09-28 DIAGNOSIS — J449 Chronic obstructive pulmonary disease, unspecified: Secondary | ICD-10-CM | POA: Diagnosis not present

## 2014-09-28 DIAGNOSIS — Z87891 Personal history of nicotine dependence: Secondary | ICD-10-CM | POA: Diagnosis not present

## 2014-09-28 DIAGNOSIS — N184 Chronic kidney disease, stage 4 (severe): Secondary | ICD-10-CM | POA: Diagnosis not present

## 2014-09-28 DIAGNOSIS — D649 Anemia, unspecified: Secondary | ICD-10-CM | POA: Diagnosis present

## 2014-09-28 DIAGNOSIS — F0390 Unspecified dementia without behavioral disturbance: Secondary | ICD-10-CM | POA: Diagnosis present

## 2014-09-28 DIAGNOSIS — I350 Nonrheumatic aortic (valve) stenosis: Secondary | ICD-10-CM | POA: Diagnosis present

## 2014-09-28 DIAGNOSIS — E039 Hypothyroidism, unspecified: Secondary | ICD-10-CM | POA: Diagnosis present

## 2014-09-28 DIAGNOSIS — Z95828 Presence of other vascular implants and grafts: Secondary | ICD-10-CM

## 2014-09-28 DIAGNOSIS — Z66 Do not resuscitate: Secondary | ICD-10-CM | POA: Diagnosis present

## 2014-09-28 DIAGNOSIS — I10 Essential (primary) hypertension: Secondary | ICD-10-CM | POA: Diagnosis present

## 2014-09-28 DIAGNOSIS — Z79899 Other long term (current) drug therapy: Secondary | ICD-10-CM | POA: Diagnosis not present

## 2014-09-28 DIAGNOSIS — Z7901 Long term (current) use of anticoagulants: Secondary | ICD-10-CM | POA: Diagnosis not present

## 2014-09-28 DIAGNOSIS — I5033 Acute on chronic diastolic (congestive) heart failure: Secondary | ICD-10-CM | POA: Diagnosis not present

## 2014-09-28 DIAGNOSIS — E119 Type 2 diabetes mellitus without complications: Secondary | ICD-10-CM | POA: Diagnosis not present

## 2014-09-28 DIAGNOSIS — I5031 Acute diastolic (congestive) heart failure: Secondary | ICD-10-CM

## 2014-09-28 DIAGNOSIS — I509 Heart failure, unspecified: Secondary | ICD-10-CM | POA: Diagnosis not present

## 2014-09-28 DIAGNOSIS — D509 Iron deficiency anemia, unspecified: Secondary | ICD-10-CM | POA: Diagnosis not present

## 2014-09-28 DIAGNOSIS — F0391 Unspecified dementia with behavioral disturbance: Secondary | ICD-10-CM

## 2014-09-28 DIAGNOSIS — J811 Chronic pulmonary edema: Secondary | ICD-10-CM | POA: Diagnosis present

## 2014-09-28 DIAGNOSIS — E2740 Unspecified adrenocortical insufficiency: Secondary | ICD-10-CM | POA: Diagnosis present

## 2014-09-28 DIAGNOSIS — E785 Hyperlipidemia, unspecified: Secondary | ICD-10-CM | POA: Diagnosis not present

## 2014-09-28 DIAGNOSIS — R0602 Shortness of breath: Secondary | ICD-10-CM | POA: Diagnosis not present

## 2014-09-28 DIAGNOSIS — D638 Anemia in other chronic diseases classified elsewhere: Secondary | ICD-10-CM | POA: Diagnosis present

## 2014-09-28 HISTORY — DX: Reserved for inherently not codable concepts without codable children: IMO0001

## 2014-09-28 HISTORY — DX: Heart failure, unspecified: I50.9

## 2014-09-28 HISTORY — DX: Major depressive disorder, single episode, unspecified: F32.9

## 2014-09-28 HISTORY — DX: Anxiety disorder, unspecified: F41.9

## 2014-09-28 HISTORY — DX: Depression, unspecified: F32.A

## 2014-09-28 HISTORY — DX: Cardiac murmur, unspecified: R01.1

## 2014-09-28 LAB — CBC WITH DIFFERENTIAL/PLATELET
BASOS ABS: 0 10*3/uL (ref 0.0–0.1)
BASOS PCT: 0 % (ref 0–1)
EOS ABS: 0 10*3/uL (ref 0.0–0.7)
Eosinophils Relative: 0 % (ref 0–5)
HCT: 28.7 % — ABNORMAL LOW (ref 36.0–46.0)
HEMOGLOBIN: 9.2 g/dL — AB (ref 12.0–15.0)
LYMPHS ABS: 0.8 10*3/uL (ref 0.7–4.0)
Lymphocytes Relative: 8 % — ABNORMAL LOW (ref 12–46)
MCH: 31.9 pg (ref 26.0–34.0)
MCHC: 32.1 g/dL (ref 30.0–36.0)
MCV: 99.7 fL (ref 78.0–100.0)
Monocytes Absolute: 0.6 10*3/uL (ref 0.1–1.0)
Monocytes Relative: 6 % (ref 3–12)
NEUTROS PCT: 86 % — AB (ref 43–77)
Neutro Abs: 8.1 10*3/uL — ABNORMAL HIGH (ref 1.7–7.7)
Platelets: 213 10*3/uL (ref 150–400)
RBC: 2.88 MIL/uL — ABNORMAL LOW (ref 3.87–5.11)
RDW: 18.1 % — ABNORMAL HIGH (ref 11.5–15.5)
WBC: 9.5 10*3/uL (ref 4.0–10.5)

## 2014-09-28 LAB — I-STAT TROPONIN, ED: Troponin i, poc: 0.05 ng/mL (ref 0.00–0.08)

## 2014-09-28 LAB — BASIC METABOLIC PANEL
Anion gap: 7 (ref 5–15)
BUN: 39 mg/dL — AB (ref 6–20)
CALCIUM: 8.3 mg/dL — AB (ref 8.9–10.3)
CO2: 22 mmol/L (ref 22–32)
CREATININE: 2.96 mg/dL — AB (ref 0.44–1.00)
Chloride: 109 mmol/L (ref 101–111)
GFR calc non Af Amer: 14 mL/min — ABNORMAL LOW (ref >60)
GFR, EST AFRICAN AMERICAN: 17 mL/min — AB (ref >60)
Glucose, Bld: 105 mg/dL — ABNORMAL HIGH (ref 65–99)
Potassium: 4.2 mmol/L (ref 3.5–5.1)
Sodium: 138 mmol/L (ref 135–145)

## 2014-09-28 LAB — BRAIN NATRIURETIC PEPTIDE

## 2014-09-28 MED ORDER — ASPIRIN EC 81 MG PO TBEC
81.0000 mg | DELAYED_RELEASE_TABLET | Freq: Every day | ORAL | Status: DC
Start: 2014-09-28 — End: 2014-09-29
  Administered 2014-09-28 – 2014-09-29 (×2): 81 mg via ORAL
  Filled 2014-09-28 (×2): qty 1

## 2014-09-28 MED ORDER — SODIUM CHLORIDE 0.9 % IV SOLN: 250.0000 mL | INTRAVENOUS | Status: DC | PRN

## 2014-09-28 MED ORDER — HYDROCORTISONE 20 MG PO TABS
20.0000 mg | ORAL_TABLET | Freq: Two times a day (BID) | ORAL | Status: DC
Start: 2014-09-28 — End: 2014-09-29
  Administered 2014-09-28 – 2014-09-29 (×3): 20 mg via ORAL
  Filled 2014-09-28 (×4): qty 1

## 2014-09-28 MED ORDER — SODIUM CHLORIDE 0.9 % IJ SOLN
3.0000 mL | Freq: Two times a day (BID) | INTRAMUSCULAR | Status: DC
  Administered 2014-09-28 – 2014-09-29 (×2): 3 mL via INTRAVENOUS

## 2014-09-28 MED ORDER — PRAVASTATIN SODIUM 20 MG PO TABS
20.0000 mg | ORAL_TABLET | Freq: Every day | ORAL | Status: DC
  Administered 2014-09-28: 20 mg via ORAL
  Filled 2014-09-28 (×2): qty 1

## 2014-09-28 MED ORDER — FERROUS SULFATE 325 (65 FE) MG PO TABS
325.0000 mg | ORAL_TABLET | Freq: Every day | ORAL | Status: DC
  Administered 2014-09-28 – 2014-09-29 (×2): 325 mg via ORAL
  Filled 2014-09-28 (×2): qty 1

## 2014-09-28 MED ORDER — IPRATROPIUM-ALBUTEROL 0.5-2.5 (3) MG/3ML IN SOLN
3.0000 mL | Freq: Once | RESPIRATORY_TRACT | Status: AC
  Administered 2014-09-28: 3 mL via RESPIRATORY_TRACT
  Filled 2014-09-28: qty 3

## 2014-09-28 MED ORDER — ONDANSETRON HCL 4 MG/2ML IJ SOLN: 4.0000 mg | Freq: Four times a day (QID) | INTRAMUSCULAR | Status: DC | PRN

## 2014-09-28 MED ORDER — CARVEDILOL 6.25 MG PO TABS
6.2500 mg | ORAL_TABLET | Freq: Once | ORAL | Status: AC
  Administered 2014-09-28: 6.25 mg via ORAL
  Filled 2014-09-28: qty 1

## 2014-09-28 MED ORDER — POTASSIUM CHLORIDE CRYS ER 20 MEQ PO TBCR
20.0000 meq | EXTENDED_RELEASE_TABLET | Freq: Every day | ORAL | Status: DC
  Administered 2014-09-28 – 2014-09-29 (×2): 20 meq via ORAL
  Filled 2014-09-28 (×2): qty 1

## 2014-09-28 MED ORDER — FENOFIBRATE 160 MG PO TABS
160.0000 mg | ORAL_TABLET | Freq: Every day | ORAL | Status: DC
  Administered 2014-09-28: 160 mg via ORAL
  Filled 2014-09-28 (×2): qty 1

## 2014-09-28 MED ORDER — VANCOMYCIN 50 MG/ML ORAL SOLUTION
125.0000 mg | Freq: Four times a day (QID) | ORAL | Status: DC
  Administered 2014-09-28: 125 mg via ORAL
  Filled 2014-09-28 (×3): qty 2.5

## 2014-09-28 MED ORDER — MYCOPHENOLATE MOFETIL 250 MG PO CAPS
500.0000 mg | ORAL_CAPSULE | Freq: Two times a day (BID) | ORAL | Status: DC
  Administered 2014-09-28 – 2014-09-29 (×2): 500 mg via ORAL
  Filled 2014-09-28 (×3): qty 2

## 2014-09-28 MED ORDER — ACETAMINOPHEN 325 MG PO TABS: 650.0000 mg | ORAL_TABLET | ORAL | Status: DC | PRN

## 2014-09-28 MED ORDER — AMLODIPINE BESYLATE 2.5 MG PO TABS: 2.5000 mg | ORAL_TABLET | Freq: Every day | ORAL | Status: DC

## 2014-09-28 MED ORDER — FUROSEMIDE 10 MG/ML IJ SOLN
20.0000 mg | Freq: Once | INTRAMUSCULAR | Status: AC
  Administered 2014-09-28: 20 mg via INTRAVENOUS
  Filled 2014-09-28: qty 2

## 2014-09-28 MED ORDER — LEVOTHYROXINE SODIUM 88 MCG PO TABS
88.0000 ug | ORAL_TABLET | Freq: Every day | ORAL | Status: DC
  Administered 2014-09-28 – 2014-09-29 (×2): 88 ug via ORAL
  Filled 2014-09-28 (×2): qty 1

## 2014-09-28 MED ORDER — PANTOPRAZOLE SODIUM 40 MG PO TBEC
40.0000 mg | DELAYED_RELEASE_TABLET | Freq: Two times a day (BID) | ORAL | Status: DC
  Administered 2014-09-28 – 2014-09-29 (×3): 40 mg via ORAL
  Filled 2014-09-28 (×3): qty 1

## 2014-09-28 MED ORDER — ISOSORBIDE MONONITRATE 15 MG HALF TABLET
15.0000 mg | ORAL_TABLET | Freq: Every day | ORAL | Status: DC
  Administered 2014-09-28: 15 mg via ORAL
  Filled 2014-09-28: qty 1

## 2014-09-28 MED ORDER — LABETALOL HCL 5 MG/ML IV SOLN: 5.0000 mg | Freq: Once | INTRAVENOUS | Status: DC

## 2014-09-28 MED ORDER — FUROSEMIDE 40 MG PO TABS
40.0000 mg | ORAL_TABLET | Freq: Every day | ORAL | Status: DC
  Filled 2014-09-28: qty 1

## 2014-09-28 MED ORDER — FUROSEMIDE 10 MG/ML IJ SOLN
60.0000 mg | Freq: Once | INTRAMUSCULAR | Status: AC
  Administered 2014-09-28: 60 mg via INTRAVENOUS
  Filled 2014-09-28: qty 6

## 2014-09-28 MED ORDER — ISOSORBIDE MONONITRATE ER 30 MG PO TB24
30.0000 mg | ORAL_TABLET | Freq: Every day | ORAL | Status: DC
  Administered 2014-09-29: 30 mg via ORAL
  Filled 2014-09-28: qty 1

## 2014-09-28 MED ORDER — APIXABAN 2.5 MG PO TABS
2.5000 mg | ORAL_TABLET | Freq: Every day | ORAL | Status: DC
  Administered 2014-09-28: 2.5 mg via ORAL
  Filled 2014-09-28 (×2): qty 1

## 2014-09-28 MED ORDER — DIPHENOXYLATE-ATROPINE 2.5-0.025 MG PO TABS: 1.0000 | ORAL_TABLET | ORAL | Status: DC

## 2014-09-28 MED ORDER — CARVEDILOL 6.25 MG PO TABS
6.2500 mg | ORAL_TABLET | Freq: Two times a day (BID) | ORAL | Status: DC
  Administered 2014-09-28 – 2014-09-29 (×3): 6.25 mg via ORAL
  Filled 2014-09-28 (×4): qty 1

## 2014-09-28 MED ORDER — AMLODIPINE BESYLATE 2.5 MG PO TABS
2.5000 mg | ORAL_TABLET | Freq: Every day | ORAL | Status: DC
  Administered 2014-09-28 – 2014-09-29 (×2): 2.5 mg via ORAL
  Filled 2014-09-28 (×2): qty 1

## 2014-09-28 MED ORDER — SULFAMETHOXAZOLE-TRIMETHOPRIM 400-80 MG PO TABS
1.0000 | ORAL_TABLET | ORAL | Status: DC
  Administered 2014-09-28: 1 via ORAL
  Filled 2014-09-28 (×2): qty 1

## 2014-09-28 MED ORDER — ZOLPIDEM TARTRATE 5 MG PO TABS: 5.0000 mg | ORAL_TABLET | Freq: Every evening | ORAL | Status: DC | PRN

## 2014-09-28 MED ORDER — FUROSEMIDE 10 MG/ML IJ SOLN
40.0000 mg | Freq: Once | INTRAMUSCULAR | Status: AC
  Administered 2014-09-28: 40 mg via INTRAVENOUS
  Filled 2014-09-28: qty 4

## 2014-09-28 MED ORDER — CITALOPRAM HYDROBROMIDE 20 MG PO TABS
20.0000 mg | ORAL_TABLET | Freq: Every day | ORAL | Status: DC
Start: 2014-09-28 — End: 2014-09-29
  Administered 2014-09-28 – 2014-09-29 (×2): 20 mg via ORAL
  Filled 2014-09-28 (×2): qty 1

## 2014-09-28 MED ORDER — SODIUM CHLORIDE 0.9 % IJ SOLN: 3.0000 mL | INTRAMUSCULAR | Status: DC | PRN

## 2014-09-28 MED ORDER — VALGANCICLOVIR HCL 450 MG PO TABS
450.0000 mg | ORAL_TABLET | ORAL | Status: DC
  Administered 2014-09-28: 450 mg via ORAL
  Filled 2014-09-28: qty 1

## 2014-09-28 MED ORDER — MYCOPHENOLATE MOFETIL 500 MG PO TABS
500.0000 mg | ORAL_TABLET | Freq: Two times a day (BID) | ORAL | Status: DC
  Administered 2014-09-28: 500 mg via ORAL
  Filled 2014-09-28 (×3): qty 1

## 2014-09-28 MED ORDER — AMLODIPINE BESYLATE 5 MG PO TABS
2.5000 mg | ORAL_TABLET | Freq: Once | ORAL | Status: AC
  Administered 2014-09-28: 2.5 mg via ORAL
  Filled 2014-09-28: qty 1

## 2014-09-28 MED ORDER — BUSPIRONE HCL 15 MG PO TABS
15.0000 mg | ORAL_TABLET | Freq: Two times a day (BID) | ORAL | Status: DC
  Administered 2014-09-28 – 2014-09-29 (×3): 15 mg via ORAL
  Filled 2014-09-28 (×4): qty 1

## 2014-09-28 MED ORDER — ADULT MULTIVITAMIN W/MINERALS CH
1.0000 | ORAL_TABLET | Freq: Every day | ORAL | Status: DC
  Administered 2014-09-28 – 2014-09-29 (×2): 1 via ORAL
  Filled 2014-09-28 (×2): qty 1

## 2014-09-28 MED ORDER — CYCLOSPORINE MODIFIED (NEORAL) 25 MG PO CAPS
125.0000 mg | ORAL_CAPSULE | Freq: Two times a day (BID) | ORAL | Status: DC
  Administered 2014-09-28 – 2014-09-29 (×3): 125 mg via ORAL
  Filled 2014-09-28 (×4): qty 5

## 2014-09-28 NOTE — Consult Note (Addendum)
CARDIOLOGY CONSULT NOTE   Patient ID: Nancy Blair MRN: [id number], DOB/AGE: 1935-05-19   Admit date: 09/28/2014 Date of Consult: 09/28/2014  Primary Physician: Annye Asa, MD Primary Cardiologist: Dr Dola Argyle  Reason for consult:  SOB  Problem List  Past Medical History  Diagnosis Date  . Emphysema of lung   . Hypertension   . Hyperlipidemia   . Allergy   . Anemia   . GERD (gastroesophageal reflux disease)   . Adrenal insufficiency   . Squamous acanthoma of skin 06/2011    rt leg  . Hypothyroidism   . CKD (chronic kidney disease) stage 4, GFR 15-29 ml/min     since 2012 (per [facility] records)  . Lung transplant status, bilateral   . Acute on chronic diastolic heart failure   . Ejection fraction   . Diabetes mellitus without complication   . DVT (deep venous thrombosis) 08/2014    RT LEG    Past Surgical History  Procedure Laterality Date  . Appendectomy    . Abdominal hysterectomy    . Tonsillectomy    . Total lung replacement  2012    b/l lung transplant  . Esophagogastroduodenoscopy  05/26/2011    Procedure: ESOPHAGOGASTRODUODENOSCOPY (EGD);  Surgeon: Owens Loffler, MD;  Location: [facility];  Service: Endoscopy;  Laterality: N/A;  . Nissen fundoplication    . Esophagogastroduodenoscopy N/A 09/08/2014    Procedure: ESOPHAGOGASTRODUODENOSCOPY (EGD);  Surgeon: Jerene Bears, MD;  Location: [facility];  Service: Endoscopy;  Laterality: N/A;    Allergies  Allergies  Allergen Reactions  . Grapefruit Extract Other (See Comments)    Cannot take with meds  . Oysters [Shellfish Allergy] Swelling  . Pineapple Swelling  . Bupropion Other (See Comments)    sleepy  . Neomycin-Bacitracin Zn-Polymyx Hives and Other (See Comments)    Blisters (pt has used small amounts without a reaction)  . Penicillins Swelling    Facial swelling  . Prednisone Other (See Comments)    crazy   HPI   Nancy Blair is a 79 y.o. female who presents today to follow-up  aortic stenosis. She has a complex history. Most recently in early May, 2016, the patient traveled and then had DVT. She was treated with Coumadin. Unfortunately she presented with GI bleeding with an INR of 6. Upper endoscopy was done showing several findings included a polyp that was biopsied. Coumadin had to be stopped despite her DVT. An IVC filter was placed on 09/07/2014. She is currently on Eliquis. Other problems include stage IV chronic kidney disease, s/p B/L lung transplant, recent C. difficile colitis  history of lung transplant with antirejection medications. It is my understanding that the patient is followed by nephrology. At this time dialysis is not planned because of overall poor health. History the patient has normal left regular function. There is aortic stenosis. In 2011 it was mild. Echo in 2015 showed progression of her aortic stenosis. It was moderate to severe at that time. Therefore she is seen for follow-up. Despite her multitude of other medical problems, she is not having chest pain or shortness of breath.  The patient felt her normal self until the last night, this morning she woke with an acute SOB, she came to the ER and was found to have significantly elevated BP up to 650 systolic and also to be wheezing. She has received breathing treatment and feels significantly better. She was also given iv Lasix.  She walks minimally and usually doesn't have any SOB,  denies CP, syncope.  Inpatient Medications  . apixaban  2.5 mg Oral Daily  . aspirin EC  81 mg Oral Daily  . cycloSPORINE modified  125 mg Oral BID  . isosorbide mononitrate  15 mg Oral Daily  . levothyroxine  88 mcg Oral Daily  . mycophenolate  500 mg Oral BID  . valGANciclovir  450 mg Oral Q M,W,F   Family History Family History  Problem Relation Age of Onset  . Heart disease Mother   . Heart disease Father   . Pancreatic cancer Sister   . Colon cancer Neg Hx   . Malignant hyperthermia Neg Hx   .  Hypertension Mother   . Hypertension Father     Social History History   Social History  . Marital Status: Married    Spouse Name: N/A  . Number of Children: N/A  . Years of Education: N/A   Occupational History  . retired     Teacher, adult education   Social History Main Topics  . Smoking status: Former Smoker -- 3.00 packs/day    Start date: 04/17/1952    Quit date: 07/16/1993  . Smokeless tobacco: Never Used     Comment: quit 25 years ago  . Alcohol Use: No  . Drug Use: No  . Sexual Activity: No   Other Topics Concern  . Not on file   Social History Narrative   Lives with husband   Is a DNAR   Daughter who is an Rn [phone]    Review of Systems  General:  No chills, fever, night sweats or weight changes.  Cardiovascular:  No chest pain, dyspnea on exertion, edema, orthopnea, palpitations, paroxysmal nocturnal dyspnea. Dermatological: No rash, lesions/masses Respiratory: No cough, dyspnea Urologic: No hematuria, dysuria Abdominal:   No nausea, vomiting, diarrhea, bright red blood per rectum, melena, or hematemesis Neurologic:  No visual changes, wkns, changes in mental status. All other systems reviewed and are otherwise negative except as noted above.  Physical Exam  Blood pressure 214/99, pulse 86, temperature 98 F (36.7 C), temperature source Oral, resp. rate 35, height 5' (1.524 m), weight 134 lb (60.782 kg), SpO2 94 %.  General: Pleasant, NAD Psych: Normal affect. Neuro: Alert and oriented X 3. Moves all extremities spontaneously. HEENT: Normal  Neck: Supple without bruits or JVD. Lungs:  Resp regular and unlabored, minimal rales at bases. Heart: irregularly irregular, no s3, s4, 5/6 holosystolic murmur, no S2. Abdomen: Soft, non-tender, non-distended, BS + x 4.  Extremities: No clubbing, cyanosis or edema. DP/PT/Radials 2+ and equal bilaterally.  Labs  No results for input(s): CKTOTAL, CKMB, TROPONINI in the last 72 hours. Lab Results  Component Value Date     WBC 9.5 09/28/2014   HGB 9.2* 09/28/2014   HCT 28.7* 09/28/2014   MCV 99.7 09/28/2014   PLT 213 09/28/2014    Recent Labs Lab 09/28/14 0858  NA 138  K 4.2  CL 109  CO2 22  BUN 39*  CREATININE 2.96*  CALCIUM 8.3*  GLUCOSE 105*   Lab Results  Component Value Date   CHOL 171 02/02/2014   HDL 45.80 02/02/2014   LDLCALC 43 09/05/2013   TRIG 263.0* 02/02/2014   Radiology/Studies  Dg Chest 2 View  09/28/2014   CLINICAL DATA:  Shortness of breath, COPD   IMPRESSION: Central mild vascular congestion and mild perihilar interstitial prominence suspicious for mild interstitial edema. Small bilateral loculated pleural effusion with bilateral basilar atelectasis or infiltrate. Small amount of fluid noted in right major fissure.  Mild compression deformity upper lumbar spine of indeterminate age.   Echocardiogram - 07/16/2013 - Left ventricle: The cavity size was normal. Wall thickness was increased in a pattern of mild LVH. Systolic function was normal. The estimated ejection fraction was in the range of 60% to 65%. Wall motion was normal; there were no regional wall motion abnormalities. Doppler parameters are consistent with abnormal left ventricular relaxation (grade 1 diastolic dysfunction). Doppler parameters are consistent with high ventricular filling pressure. - Aortic valve: There was moderate to severe stenosis. Valve area: 0.75cm^2(VTI). Valve area: 0.86cm^2 (Vmax). - Mitral valve: Calcified annulus. Mild regurgitation. - Left atrium: The atrium was moderately dilated. - Pulmonary arteries: Systolic pressure was mildly increased. Impressions:  - Normal LV function; calcified aortic valve with moderate to severe AS (mean gradient 36 mmHg).  ECG: SR, LVH with repolarization abnormalities, unchanged from 07/16/2013     ASSESSMENT AND PLAN  79 year old female  1. SOB - the patient has several reasons for SOB - h/o lung transplant, wheezing on  admission, improved with breathing treatment, ? obstructive disease in transplanted lungs,  Also hypertensive urgency and aortic stenosis, CHF - diastolic and valvular in origin  2. Moderate to severe aortic stenosis - mean gradient 36 in 2015, we will repeat echo now, based on physical exam she now has severe AS, once we have the results from echo, we will ask for TAVR considertaion, she is absolutely not a surgical candidate considering comorbidities - like CKD stage 4, dementia and h/o major thoracic surgery - lung transplant.  3. Chronic diastolic CHF - the patient appears mildly fluid overloaded, she received iv lasix already, her weight today 134, what is her baseline. She is on low dose of lasix 20 mg po three times a week, I would start 40 mg po daily, monitor Crea closely.  4. Hypertensive urgency - restart amlodipine 2.5 mg po daily, Imdur 30 mg po daily, hold carvedilol as she was wheezing on admission.  5. H/o DVT - on Eliquis  6. CKD stage 4 Crea 2.9 - at her baseline   Signed, Dorothy Spark, MD, [facility] 09/28/2014, 11:24 AM

## 2014-09-28 NOTE — H&P (Addendum)
Patient was seen, examined, treatment plan was discussed with the Physician extender. I have directly reviewed the clinical findings, lab, imaging studies and management of this patient in detail. I have made the necessary changes to the above noted documentation, and agree with the documentation, as recorded by the Physician extender.  Brief narrative: 79 year old female with past medical history significant for bilateral lung transplant in 5956, chronic diastolic CHF, severe aortic stenosis, chronic kidney disease stage IV, hypertension, COPD, right lower extremity DVT, recent hospitalization from 09/04/2014 through 09/09/2014 for GI bleed and at that time Coumadin was placed on hold. IVC filter was placed 09/07/2014. Of note, last office visit with primary care was 09/21/2014. During that visit the discussion was about the palliative care consultation as patient and her daughter did not feel that there should be aggressive intervention especially when it comes to management of chronic kidney disease, specifically no dialysis. Patient presented to [facility] ED with reports of worsening shortness of breath which woke her up from sleep the night prior to this admission. She did not have reports of chest pain at that time. No fevers or cough. In ED, patient was hypoxic with oxygen saturation of 89% in room air. This has improved with nasal cannula oxygen support up to 98%. Blood pressure initially was 232/109 but has subsequently improved to 140/116 with Lasix 40 mg IV 1 time dose. Chest x-ray on the admission showed mild vascular congestion, small bilateral loculated pleural effusion. Blood work was significant for hemoglobin of 9.2, creatinine 2.96, BNP more than 4500, troponin level 0.05. Patient was admitted for further evaluation of shortness of breath.  Assessment and plan:  Principal problem: Acute respiratory failure with hypoxia / acute diastolic CHF / pulmonary vascular congestion - Patient  presented with hypoxia and shortness of breath. This is likely secondary to acute diastolic CHF, pulmonary edema - Last 2 D ECHO in 07/2013 with grade 1 diastolic dysfunction and preserved EF - Patient was given one dose of Lasix 40 mg IV in ER and once seen by [facility] she received additional 60 mg of IV Lasix. - We subsequently resumed her lasix per home dose, 40 mg daily  - Continue carvedilol 6.25 mg PO BID, imdur 30 mg daily - Continue statin therapy - Appreciate cardiology consult and recommendations. - Monitor on telemetry. - Replete electrolytes as needed  - As mentioned above, daughter has had a conversation with primary care physician about palliative care. Patient is a DO NOT RESUSCITATE. If she doesn't feel better in next couple of days it may be worthwhile obtaining palliative care consultation to further address goals of care.  Active problems: Accelerated hypertension - Patient reports that she missed her medications this morning. - We will resume patient's medications which include Norvasc, carvedilol, Imdur  - Off note, as mentioned she already received Lasix in ED  History of lung transplant - On chronic immunosuppressive therapy - Resume cortef 20 mg daily - Resume cyclosporine and mycophenolate - Resume prophylactic bactrim and acyclovir   Dyslipidemia - Continue fenofibrate and Pravachol   Anemia of chronic kidney disease / Iron deficiency anemia - Hemoglobins table - No current indications for transfusion  - Resume iron supplementation   Nancy Blair [facility] [phone]  *For further details please refer to admission note done by physician extender below.    [facility] History and Physical  Nancy Blair, is a 79 y.o. female  MRN: [id number]   DOB - 1936/02/18  Admit Date - 09/28/2014  Outpatient Primary MD for the patient is Annye Asa, MD  Referring MD: Canary Brim /  ER  Consulting MD: [facility]  With History of -  Past Medical History  Diagnosis Date  . Emphysema of lung   . Hypertension   . Hyperlipidemia   . Allergy   . Anemia   . GERD (gastroesophageal reflux disease)   . Adrenal insufficiency   . Squamous acanthoma of skin 06/2011    rt leg  . Hypothyroidism   . CKD (chronic kidney disease) stage 4, GFR 15-29 ml/min     since 2012 (per [facility] records)  . Lung transplant status, bilateral   . Acute on chronic diastolic heart failure   . Ejection fraction   . Diabetes mellitus without complication   . DVT (deep venous thrombosis) 08/2014    RT LEG      Past Surgical History  Procedure Laterality Date  . Appendectomy    . Abdominal hysterectomy    . Tonsillectomy    . Total lung replacement  2012    b/l lung transplant  . Esophagogastroduodenoscopy  05/26/2011    Procedure: ESOPHAGOGASTRODUODENOSCOPY (EGD);  Surgeon: Owens Loffler, MD;  Location: [facility];  Service: Endoscopy;  Laterality: N/A;  . Nissen fundoplication    . Esophagogastroduodenoscopy N/A 09/08/2014    Procedure: ESOPHAGOGASTRODUODENOSCOPY (EGD);  Surgeon: Jerene Bears, MD;  Location: [facility];  Service: Endoscopy;  Laterality: N/A;    in for   Chief Complaint  Patient presents with  . Shortness of Breath     HPI This is a 79 year old female patient known history of chronic diastolic dysfunction grade 1 and moderate to severe aortic stenosis followed as an outpatient by cardiology, hypertension, hypothyroidism, chronic kidney disease stage 3-4, prior bilateral lung transplant on chronic immunosuppression, adrenal insufficiency on chronic Solu-Cortef, previous lower extremity DVT status post IVC filter, history of GI bleeding now off anticoagulation. Patient presents to the ER after awakening this morning with shortness of breath. Daughter in room assisting with history. Patient typically takes low-dose Lasix 20 mg Monday Wednesday Fridays and takes  occasional extra doses based on weight gain or edema. Daughter reported last week patient had significant edema involving the feet and lower extremities and took several extra doses of Lasix with apparent improvement in her edema. Unfortunately as mentioned patient awakened this morning with shortness of breath that persisted so she presented to the ER. Before presenting to the ER patient did not have the opportunity to take her morning anti-hypertensive medications. Patient has been having mild orthopnea and dyspnea on exertion without any chest pain. She also has a history of dietary indiscretion according to her daughter. She was last evaluated on an outpatient basis by her cardiologist on 6/6 regarding her aortic stenosis; echocardiogram to further time because patient was asymptomatic and not short of breath. Daughter also reinforces that patient is a DO NOT RESUSCITATE and they are discussing future palliative options and therefore patient is not a candidate for dialysis or operative repair (including TAVR) her known aortic stenosis.  In the ER patient was afebrile and intermittently tachypneic and was requiring 2 L nasal cannula oxygen. Room air oxygen saturation was not documented in triage but patient's presenting blood pressure was 232/109. On exam she appeared to be volume overloaded and this was confirmed on chest x-ray noting bilateral pulmonary edema. She was started on  oxygen and was given a dose of Norvasc 2.5 mg as well as a dose of IV Lasix 20 mg. Patient reports some improvement in symptoms but not resolution. Blood flow panel was unremarkable noting BUN and creatinine at baseline with a BUN of 39 and creatinine at 2.96, BNP was greater than 4500, troponin 0.05, hemoglobin at baseline around 9.2 with mildly elevated neutrophils of 86% and a patient who is on chronic immunosuppression as well as chronic Solu-Cortef.   Review of Systems   In addition to the HPI above,  No Fever-chills,  myalgias or other constitutional symptoms No Headache, changes with Vision or hearing, new weakness, tingling, numbness in any extremity, No problems swallowing food or Liquids, indigestion/reflux No Chest pain, Cough, palpitations No Abdominal pain, N/V; no melena or hematochezia, no dark tarry stools, Bowel movements are regular, No dysuria, hematuria or flank pain No new skin rashes, lesions, masses or bruises, No new joints pains-aches No polyuria, polydypsia or polyphagia,  *A full 10 point Review of Systems was done, except as stated above, all other Review of Systems were negative.  Social History History  Substance Use Topics  . Smoking status: Former Smoker -- 3.00 packs/day    Start date: 04/17/1952    Quit date: 07/16/1993  . Smokeless tobacco: Never Used     Comment: quit 25 years ago  . Alcohol Use: No    Resides at: Private residence  Lives with: Daughter  Ambulatory status: Mostly independent but occasionally with walker   Family History Family History  Problem Relation Age of Onset  . Heart disease Mother   . Heart disease Father   . Pancreatic cancer Sister   . Colon cancer Neg Hx   . Malignant hyperthermia Neg Hx   . Hypertension Mother   . Hypertension Father      Prior to Admission medications (these medications had not been reconciled by the pharmacy department at time of admission)  Medication Sig Start Date End Date Taking? Authorizing Provider  amLODipine (NORVASC) 2.5 MG tablet Take 1 tablet (2.5 mg total) by mouth daily at 10 pm. 09/22/14  Yes Midge Minium, MD  apixaban (ELIQUIS) 2.5 MG TABS tablet Take 1 tablet (2.5 mg total) by mouth daily. 09/23/14  Yes Midge Minium, MD  aspirin 81 MG tablet Take 81 mg by mouth daily.   Yes Historical Provider, MD  busPIRone (BUSPAR) 15 MG tablet Take 1 tablet (15 mg total) by mouth 2 (two) times daily. 09/21/14  Yes Midge Minium, MD  carvedilol (COREG) 6.25 MG tablet Take 1 tablet (6.25 mg  total) by mouth 2 (two) times daily with a meal. 02/07/12  Yes Bonnielee Haff, MD  citalopram (CELEXA) 20 MG tablet Take 20 mg by mouth daily.   Yes Historical Provider, MD  cycloSPORINE modified (NEORAL) 25 MG capsule Take 125 mg by mouth 2 (two) times daily.    Yes Historical Provider, MD  diphenoxylate-atropine (LOMOTIL) 2.5-0.025 MG per tablet Take 1-2 tablets by mouth See admin instructions. Take 2 tablets daily at lunch (2pm), may take 1 more tablet later in the day as needed for diarrhea   Yes Historical Provider, MD  fenofibrate 160 MG tablet TAKE ONE TABLET BY MOUTH EVERY DAY Patient taking differently: TAKE ONE TABLET BY MOUTH DAILY AT BEDTIME 08/14/14  Yes Midge Minium, MD  ferrous sulfate 325 (65 FE) MG tablet Take 325 mg by mouth daily.    Yes Historical Provider, MD  furosemide (LASIX) 20 MG tablet  Take 1 tablet (20 mg total) by mouth as needed for edema. Patient taking differently: Take 20 mg by mouth 3 (three) times a week.  09/16/14  Yes Midge Minium, MD  hydrocortisone (CORTEF) 20 MG tablet Take 20 mg by mouth 2 (two) times daily.  10/27/11  Yes Historical Provider, MD  isosorbide mononitrate (IMDUR) 30 MG 24 hr tablet Take 1 tablet (30 mg total) by mouth daily. Patient taking differently: Take 15 mg by mouth daily.  08/28/14  Yes Ripudeep Krystal Eaton, MD  levothyroxine (SYNTHROID, LEVOTHROID) 88 MCG tablet Take 88 mcg by mouth daily.   Yes Historical Provider, MD  Multiple Vitamin (MULITIVITAMIN WITH MINERALS) TABS Take 1 tablet by mouth daily.   Yes Historical Provider, MD  mycophenolate (CELLCEPT) 500 MG tablet Take 500 mg by mouth 2 (two) times daily.  12/21/11  Yes Historical Provider, MD  pantoprazole (PROTONIX) 40 MG tablet Take 1 tablet (40 mg total) by mouth 2 (two) times daily. 09/09/14  Yes Robbie Lis, MD  potassium chloride SA (K-DUR,KLOR-CON) 20 MEQ tablet Take 1 tablet (20 mEq total) by mouth daily. 09/09/14  Yes Robbie Lis, MD  pravastatin (PRAVACHOL) 20 MG tablet  Take 1 tablet (20 mg total) by mouth at bedtime. 09/22/14  Yes Midge Minium, MD  sulfamethoxazole-trimethoprim (BACTRIM,SEPTRA) 400-80 MG per tablet TAKE 1 TABLET BY MOUTH ONCE EVERY [place], [place], [place] 09/23/14  Yes Historical Provider, MD  valGANciclovir (VALCYTE) 450 MG tablet Take 450 mg by mouth every Monday, Wednesday, and Friday.    Yes Historical Provider, MD  vancomycin (VANCOCIN) 50 mg/mL oral solution Take 2.5 mLs (125 mg total) by mouth 4 (four) times daily. 09/09/14  Yes Robbie Lis, MD  zaleplon (SONATA) 5 MG capsule Take 5 mg by mouth daily at 6 PM.   Yes Historical Provider, MD  citalopram (CELEXA) 40 MG tablet Take 1 tablet (40 mg total) by mouth at bedtime. Patient not taking: Reported on 09/28/2014 09/22/14   Midge Minium, MD  levothyroxine (SYNTHROID, LEVOTHROID) 100 MCG tablet TAKE 1 TABLET BY MOUTH DAILY Patient not taking: Reported on 09/28/2014 09/23/14   Midge Minium, MD  Potassium Chloride ER 20 MEQ TBCR TAKE 1 TABLET BY MOUTH ONCE DAILY Patient not taking: Reported on 09/28/2014 09/23/14   Midge Minium, MD  sulfamethoxazole-trimethoprim (BACTRIM DS,SEPTRA DS) 800-160 MG per tablet Take 1 tablet by mouth every Monday, Wednesday, and Friday. Patient not taking: Reported on 09/28/2014 09/23/14   Midge Minium, MD    Allergies  Allergen Reactions  . Grapefruit Extract Other (See Comments)    Cannot take with meds  . Oysters [Shellfish Allergy] Swelling  . Pineapple Swelling  . Bupropion Other (See Comments)    sleepy  . Neomycin-Bacitracin Zn-Polymyx Hives and Other (See Comments)    Blisters (pt has used small amounts without a reaction)  . Penicillins Swelling    Facial swelling  . Prednisone Other (See Comments)    crazy    Physical Exam  Vitals  Blood pressure 140/116, pulse 74, temperature 98 F (36.7 C), temperature source Oral, resp. rate 21, height 5' (1.524 m), weight 134 lb (60.782 kg), SpO2 96 %.   General:  In no acute  distress, appears chronically ill  Psych:  Normal affect, Denies Suicidal or Homicidal ideations, Awake Alert, Oriented X 3. Speech and thought patterns are clear and appropriate, minimal short-term memory deficits  Neuro:   No focal neurological deficits, CN II through XII intact,  Strength 5/5 all 4 extremities, Sensation intact all 4 extremities.  ENT:  Ears and Eyes appear Normal, Conjunctivae clear, PER. Moist oral mucosa without erythema or exudates.  Neck:  Supple, No lymphadenopathy appreciated  Respiratory:  Symmetrical chest wall movement, Good air movement bilaterally, fuse bilateral crackles posteriorly with scattered wheezes, inspiratory pleural rub right upper lung field anteriorly. 2 L  Cardiac:  RRR, very tight sounding grade 3/6 systolic murmur best heard secondary intercostal space right sternal border, 2+ bilateral LE edema noted, no JVD, No carotid bruits, peripheral pulses palpable at 2+  Abdomen:  Positive bowel sounds, Soft, Non tender, Non distended,  No masses appreciated, no obvious hepatosplenomegaly  Skin:  No Cyanosis, Normal Skin Turgor, No Skin Rash-she does have scattered circumferential bruises involving all of her extremities as well as chest and shoulder region  Extremities: Symmetrical without obvious trauma or injury,  no effusions.  Data Review  CBC  Recent Labs Lab 09/28/14 0858  WBC 9.5  HGB 9.2*  HCT 28.7*  PLT 213  MCV 99.7  MCH 31.9  MCHC 32.1  RDW 18.1*  LYMPHSABS 0.8  MONOABS 0.6  EOSABS 0.0  BASOSABS 0.0    Chemistries   Recent Labs Lab 09/28/14 0858  NA 138  K 4.2  CL 109  CO2 22  GLUCOSE 105*  BUN 39*  CREATININE 2.96*  CALCIUM 8.3*    estimated creatinine clearance is 13.2 mL/min (by C-G formula based on Cr of 2.96).  No results for input(s): TSH, T4TOTAL, T3FREE, THYROIDAB in the last 72 hours.  Invalid input(s): FREET3  Coagulation profile No results for input(s): INR, PROTIME in the last 168 hours.  No  results for input(s): DDIMER in the last 72 hours.  Cardiac Enzymes No results for input(s): CKMB, TROPONINI, MYOGLOBIN in the last 168 hours.  Invalid input(s): CK  Invalid input(s): POCBNP  Urinalysis    Component Value Date/Time   COLORURINE YELLOW 04/28/2014 1132   APPEARANCEUR TURBID* 04/28/2014 1132   LABSPEC 1.016 04/28/2014 1132   PHURINE 5.0 04/28/2014 1132   GLUCOSEU NEGATIVE 04/28/2014 1132   HGBUR SMALL* 04/28/2014 1132   HGBUR negative 09/15/2009 0000   BILIRUBINUR NEGATIVE 04/28/2014 1132   BILIRUBINUR neg 09/02/2012 1441   KETONESUR NEGATIVE 04/28/2014 1132   PROTEINUR 30* 04/28/2014 1132   PROTEINUR 30(1+) 09/02/2012 1441   UROBILINOGEN 0.2 04/28/2014 1132   UROBILINOGEN 0.2 09/02/2012 1441   NITRITE POSITIVE* 04/28/2014 1132   NITRITE neg 09/02/2012 1441   LEUKOCYTESUR LARGE* 04/28/2014 1132    Imaging results:   Dg Chest 2 View  09/28/2014   CLINICAL DATA:  Shortness of breath, COPD  EXAM: CHEST  2 VIEW  COMPARISON:  04/27/2014  FINDINGS: Cardiomegaly is noted. Status post median sternotomy. Central mild vascular congestion and mild perihilar interstitial prominence suspicious for mild interstitial edema. Small bilateral loculated pleural effusion with bilateral basilar atelectasis or infiltrate. Small amount of fluid noted in right major fissure.  Again noted atherosclerotic calcifications of thoracic aorta. Thoracic spine osteopenia. There is mild compression deformity in upper lumbar spine of indeterminate age.  IMPRESSION: Central mild vascular congestion and mild perihilar interstitial prominence suspicious for mild interstitial edema. Small bilateral loculated pleural effusion with bilateral basilar atelectasis or infiltrate. Small amount of fluid noted in right major fissure. Mild compression deformity upper lumbar spine of indeterminate age. Clinical correlation is necessary   Electronically Signed   By: Lahoma Crocker M.D.   On: 09/28/2014 09:21   Ir Ivc  Filter Plmt /  S&i /img Guid/mod Sed  09/07/2014   CLINICAL DATA:  Lower extremity DVT. Exacerbation of lower GI bleed on anticoagulation, a relative contraindication. Caval filtration is requested. Renal insufficiency.  EXAM: INFERIOR VENACAVOGRAM  IVC FILTER PLACEMENT UNDER FLUOROSCOPY  FLUOROSCOPY TIME:  0.7 minutes, 153.79 uGym2 DAP  TECHNIQUE: The procedure, risks (including but not limited to bleeding, infection, organ damage ), benefits, and alternatives were explained to the patient. Questions regarding the procedure were encouraged and answered. The patient understands and consents to the procedure. Patency of the right IJ vein was confirmed with ultrasound with image documentation. An appropriate skin site was determined. Skin site was marked, prepped with chlorhexidine, and draped using maximum barrier technique. The region was infiltrated locally with 1% lidocaine.  Intravenous Fentanyl and Versed were administered as conscious sedation during continuous cardiorespiratory monitoring by the radiology RN, with a total moderate sedation time of 8 minutes.  Under real-time ultrasound guidance, the right IJ vein was accessed with a 21 gauge micropuncture needle; the needle tip within the vein was confirmed with ultrasound image documentation. The needle was exchanged over a 018 guidewire for a transitional dilator, which allow advancement of the [facility] wire into the IVC. A long 6 French vascular sheath was placed for inferior venacavography using CO2. This demonstrated no caval thrombus. Renal vein inflows were evident.  The [facility] IVC filter was advanced through the sheath and successfully deployed under fluoroscopy at the L1-2 level. Followup CO2 cavagram demonstrates stable filter position and no evident complication. The sheath was removed and hemostasis achieved at the site. No immediate complication.  IMPRESSION: 1. Normal IVC. No thrombus or significant anatomic variation. 2. Technically successful  infrarenal IVC filter placement. This is a retrievable model.   Electronically Signed   By: Lucrezia Europe M.D.   On: 09/07/2014 16:36     EKG: (Independently reviewed) sinus rhythm with voltage criteria met for LVH   Assessment & Plan  Principal Problem:   Acute respiratory failure with hypoxia:   A) Pulmonary edema   B) Acute diastolic heart failure, NYHA class 1 -Admit to telemetry -Given an additional 60 mg IV Lasix in ER and plan to give at least 1 more dose tonight at bedtime and monitor response; given severity of aortic stenosis wish to avoid overdiuresis and will follow patient's weights and clinical response/UOP -Cont supportive care with oxygen -Suspect aortic stenosis contributing; will obtain echocardiogram to see if stenosis has progressed noting this will aid patient and family regarding their continuing palliative care discussions-may also help with comfort related medical management -Consult cardiology  Active Problems:   Accelerated hypertension -Suspect partially related to missed medications this morning but more evidence of patient's volume status -Continue preadmission Norvasc and carvedilol as well as Imdur-adjust if indicated -Hopefully extra doses of IV Lasix will improve blood pressure      Hypothyroidism -Continue Synthroid    Hyperlipidemia -Continue Pravachol    Aortic stenosis, severe -Suspect contributed to current respiratory symptoms -Echocardiogram -Per discussion with daughter and patient not a candidate for repair based on desire for palliative approach    CKD (chronic kidney disease) stage 4, GFR 15-29 ml/min -Renal function stable and at baseline -Given lower GFR will require higher doses of Lasix when given -Previously expressed desire not to proceed with dialysis    Lung transplant status, bilateral -cont usual immunosuppressive medications as well as anti-bacterial and anti-viral prophylaxis    Anemia -Hemoglobin around 9 and stable/at  baseline -2 new oral iron replacement  ACI (adrenal cortical insufficiency) -Continue preadmission Solu-Cortef    Dementia -Pleasant with minimal short-term memory deficits and no evidence of pending acute delirium but monitor closely    History of DVT right leg-S/P IVC filter -History of lower GI bleeding so anticoagulation discontinued    DVT Prophylaxis: Medication reconciliation listed eliquis as home medicine but had not been confirmed by pharmacy team, medical record documents patient with prior GI bleeding and off anti-coagulation and patient is also status post IVC filter therefore will not order eliquis and will utilize SCDs  Family Communication:  Daughter at bedside   Code Status: DO NOT RESUSCITATE   Condition:  Stable  Discharge disposition: Anticipate when acute hypoxemia resolved and heart failure compensated patient will return home with daughter  Time spent in minutes : 60      ELLIS,ALLISON L. ANP on 09/28/2014 at 11:39 AM  Between 7am to 7pm - Pager - [phone]  After 7pm go to [email or web address] - password [email or web address]  And look for the night coverage person covering me after hours  [facility]

## 2014-09-28 NOTE — ED Notes (Signed)
Pt woke up this AM and felt SOB, trouble catching breath.  Hx COPD, emphasema and lung transplant 2012.  Vitals with ems 76 pulse, 100% 4 l, 96% RA., 20 resp, BP 2210/110.  Was seen two weeks ago for same.

## 2014-09-28 NOTE — ED Notes (Signed)
MD at bedside. 

## 2014-09-28 NOTE — ED Notes (Signed)
Daughter would like to leave phone number [phone] as contact number

## 2014-09-28 NOTE — ED Notes (Signed)
Attempted report 

## 2014-09-28 NOTE — ED Notes (Signed)
Off unit with xray. 

## 2014-09-28 NOTE — ED Notes (Signed)
Requested pt meds from pharmacy. 

## 2014-09-28 NOTE — ED Provider Notes (Signed)
CSN: [id number]     Arrival date & time 09/28/14  0803 History   First MD Initiated Contact with Patient 09/28/14 [phone]     Chief Complaint  Patient presents with  . Shortness of Breath   HPI Nancy Blair is a 79yo woman with PMHx of HTN, COPD, s/p bilateral lung transplant (2012), anemia, diastolic HF, CKD Stage IV, and RLE DVT on Eliquis who presents to the ED with shortness of breath. Patient reports her SOB started this morning after she got up from bed. She states she has dyspnea with both exertion and rest. She reports wheezing, but denies cough, congestion, sore throat, chest pain, abdominal pain, dark or bloody stools. She reports she does not use inhalers at home.    Past Medical History  Diagnosis Date  . Emphysema of lung   . Hypertension   . Hyperlipidemia   . Allergy   . Anemia   . GERD (gastroesophageal reflux disease)   . Adrenal insufficiency   . Squamous acanthoma of skin 06/2011    rt leg  . Hypothyroidism   . CKD (chronic kidney disease) stage 4, GFR 15-29 ml/min     since 2012 (per [facility] records)  . Lung transplant status, bilateral   . Acute on chronic diastolic heart failure   . Ejection fraction   . Diabetes mellitus without complication   . DVT (deep venous thrombosis) 08/2014    RT LEG   Past Surgical History  Procedure Laterality Date  . Appendectomy    . Abdominal hysterectomy    . Tonsillectomy    . Total lung replacement  2012    b/l lung transplant  . Esophagogastroduodenoscopy  05/26/2011    Procedure: ESOPHAGOGASTRODUODENOSCOPY (EGD);  Surgeon: Owens Loffler, MD;  Location: [facility];  Service: Endoscopy;  Laterality: N/A;  . Nissen fundoplication    . Esophagogastroduodenoscopy N/A 09/08/2014    Procedure: ESOPHAGOGASTRODUODENOSCOPY (EGD);  Surgeon: Jerene Bears, MD;  Location: [facility];  Service: Endoscopy;  Laterality: N/A;   Family History  Problem Relation Age of Onset  . Heart disease Mother   . Heart disease Father   . Pancreatic  cancer Sister   . Colon cancer Neg Hx   . Malignant hyperthermia Neg Hx   . Hypertension Mother   . Hypertension Father    History  Substance Use Topics  . Smoking status: Former Smoker -- 3.00 packs/day    Start date: 04/17/1952    Quit date: 07/16/1993  . Smokeless tobacco: Never Used     Comment: quit 25 years ago  . Alcohol Use: No   OB History    No data available     Review of Systems General: Denies fever, chills, night sweats, changes in weight, changes in appetite HEENT: Denies headaches, ear pain, changes in vision, rhinorrhea, sore throat CV: Denies palpitations, orthopnea Pulm: See HPI GI: Denies nausea, vomiting, diarrhea, constipation GU: Denies dysuria, hematuria, frequency Msk: Denies muscle cramps, joint pains Neuro: Denies weakness, numbness, tingling Skin: Denies rashes, bruising   Allergies  Grapefruit extract; Oysters; Pineapple; Bupropion; Neomycin-bacitracin zn-polymyx; Penicillins; and Prednisone  Home Medications   Prior to Admission medications   Medication Sig Start Date End Date Taking? Authorizing Provider  sulfamethoxazole-trimethoprim (BACTRIM,SEPTRA) 400-80 MG per tablet TAKE 1 TABLET BY MOUTH ONCE EVERY MONDAY, [place], AND FRIDAY 09/23/14  Yes Historical Provider, MD  amLODipine (NORVASC) 2.5 MG tablet Take 1 tablet (2.5 mg total) by mouth daily at 10 pm. 09/22/14   Midge Minium, MD  apixaban (ELIQUIS) 2.5 MG TABS tablet Take 1 tablet (2.5 mg total) by mouth daily. 09/23/14   Midge Minium, MD  busPIRone (BUSPAR) 15 MG tablet Take 1 tablet (15 mg total) by mouth 2 (two) times daily. 09/21/14   Midge Minium, MD  carvedilol (COREG) 6.25 MG tablet Take 1 tablet (6.25 mg total) by mouth 2 (two) times daily with a meal. 02/07/12   Bonnielee Haff, MD  citalopram (CELEXA) 40 MG tablet Take 1 tablet (40 mg total) by mouth at bedtime. 09/22/14   Midge Minium, MD  cycloSPORINE modified (NEORAL) 25 MG capsule Take 125 mg by mouth 2  (two) times daily.     Historical Provider, MD  diphenoxylate-atropine (LOMOTIL) 2.5-0.025 MG per tablet Take 1-2 tablets by mouth See admin instructions. Take 2 tablets daily at lunch (2pm), may take 1 more tablet later in the day as needed for diarrhea    Historical Provider, MD  fenofibrate 160 MG tablet TAKE ONE TABLET BY MOUTH EVERY DAY Patient taking differently: TAKE ONE TABLET BY MOUTH DAILY AT BEDTIME 08/14/14   Midge Minium, MD  ferrous sulfate 325 (65 FE) MG tablet Take 325 mg by mouth daily.     Historical Provider, MD  furosemide (LASIX) 20 MG tablet Take 1 tablet (20 mg total) by mouth as needed for edema. 09/16/14   Midge Minium, MD  hydrocortisone (CORTEF) 20 MG tablet Take 20 mg by mouth 2 (two) times daily.  10/27/11   Historical Provider, MD  isosorbide mononitrate (IMDUR) 30 MG 24 hr tablet Take 1 tablet (30 mg total) by mouth daily. 08/28/14   Ripudeep Krystal Eaton, MD  levothyroxine (SYNTHROID, LEVOTHROID) 100 MCG tablet TAKE 1 TABLET BY MOUTH DAILY 09/23/14   Midge Minium, MD  Multiple Vitamin (MULITIVITAMIN WITH MINERALS) TABS Take 1 tablet by mouth daily.    Historical Provider, MD  mycophenolate (CELLCEPT) 500 MG tablet Take 500 mg by mouth 2 (two) times daily.  12/21/11   Historical Provider, MD  pantoprazole (PROTONIX) 40 MG tablet Take 1 tablet (40 mg total) by mouth 2 (two) times daily. 09/09/14   Robbie Lis, MD  Potassium Chloride ER 20 MEQ TBCR TAKE 1 TABLET BY MOUTH ONCE DAILY 09/23/14   Midge Minium, MD  potassium chloride SA (K-DUR,KLOR-CON) 20 MEQ tablet Take 1 tablet (20 mEq total) by mouth daily. 09/09/14   Robbie Lis, MD  pravastatin (PRAVACHOL) 20 MG tablet Take 1 tablet (20 mg total) by mouth at bedtime. 09/22/14   Midge Minium, MD  sulfamethoxazole-trimethoprim (BACTRIM DS,SEPTRA DS) 800-160 MG per tablet Take 1 tablet by mouth every Monday, Wednesday, and Friday. 09/23/14   Midge Minium, MD  valGANciclovir (VALCYTE) 450 MG tablet Take 450  mg by mouth every Monday, Wednesday, and Friday.     Historical Provider, MD  vancomycin (VANCOCIN) 50 mg/mL oral solution Take 2.5 mLs (125 mg total) by mouth 4 (four) times daily. 09/09/14   Robbie Lis, MD   BP 232/109 mmHg  Pulse 90  Temp(Src) 98 F (36.7 C) (Oral)  Resp 23  Ht 5' (1.524 m)  Wt 134 lb (60.782 kg)  BMI 26.17 kg/m2  SpO2 95% Physical Exam General: elderly woman sitting up in bed, NAD HEENT: [place]/AT, EOMI, sclera anicteric, mucus membranes moist CV: RRR, 2/6 systolic murmur heard best at RUSB Pulm: bilateral expiratory wheezes, breathing comfortably on 2 L oxygen via [place] Abd: BS+, soft, non-tender, non-distended Ext: warm, trace-1+ edema bilaterally.  RLE with bruising Neuro: alert and oriented x 3, no focal deficits  ED Course  Procedures (including critical care time) Labs Review Labs Reviewed  BASIC METABOLIC PANEL - Abnormal; Notable for the following:    Glucose, Bld 105 (*)    BUN 39 (*)    Creatinine, Ser 2.96 (*)    Calcium 8.3 (*)    GFR calc non Af Amer 14 (*)    GFR calc Af Amer 17 (*)    All other components within normal limits  CBC WITH DIFFERENTIAL/PLATELET - Abnormal; Notable for the following:    RBC 2.88 (*)    Hemoglobin 9.2 (*)    HCT 28.7 (*)    RDW 18.1 (*)    Neutrophils Relative % 86 (*)    Neutro Abs 8.1 (*)    Lymphocytes Relative 8 (*)    All other components within normal limits  BRAIN NATRIURETIC PEPTIDE - Abnormal; Notable for the following:    B Natriuretic Peptide >4500.0 (*)    All other components within normal limits  I-STAT TROPOININ, ED    Imaging Review Dg Chest 2 View  09/28/2014   CLINICAL DATA:  Shortness of breath, COPD  EXAM: CHEST  2 VIEW  COMPARISON:  04/27/2014  FINDINGS: Cardiomegaly is noted. Status post median sternotomy. Central mild vascular congestion and mild perihilar interstitial prominence suspicious for mild interstitial edema. Small bilateral loculated pleural effusion with bilateral basilar  atelectasis or infiltrate. Small amount of fluid noted in right major fissure.  Again noted atherosclerotic calcifications of thoracic aorta. Thoracic spine osteopenia. There is mild compression deformity in upper lumbar spine of indeterminate age.  IMPRESSION: Central mild vascular congestion and mild perihilar interstitial prominence suspicious for mild interstitial edema. Small bilateral loculated pleural effusion with bilateral basilar atelectasis or infiltrate. Small amount of fluid noted in right major fissure. Mild compression deformity upper lumbar spine of indeterminate age. Clinical correlation is necessary   Electronically Signed   By: Lahoma Crocker M.D.   On: 09/28/2014 09:21    EKG Interpretation Sinus rhythm, LVH  MDM   Final diagnoses:  Acute on chronic diastolic heart failure  Hypertensive urgency    79yo woman presenting with dyspnea that started this morning. She has multiple reasons for SOB, including COPD, hx bilateral lung transplant, diastolic HF, anemia, elevated BP (232/109 on exam), CKD Stage IV, and hx DVT on Eliquis. She does have significant wheezing on exam so could be related to COPD, will give a duoneb treatment. EKG shows sinus rhythm, no changes concerning for ischemia. Will check CBC, bmet, BNP, troponin, and CXR. Will give home BP meds since patient did not take them this morning and has significantly elevated BP.   Hbg above baseline at 9.2. Troponin negative. BNP elevated at >4500 and CXR with pulmonary edema and small bilateral loculated pleural effusion. Lasix 20 mg IV given to help with BP and volume overload. Will admit to [facility] for IV diuresis and BP control.   Juliet Rude, MD 09/28/14 [place], MD 09/28/14 1137

## 2014-09-28 NOTE — Progress Notes (Signed)
CCMD notified with confirmation of telemetry box [id number] for [employer]

## 2014-09-28 NOTE — ED Notes (Signed)
Waiting for Coreg from Pharmacy.

## 2014-09-29 ENCOUNTER — Telehealth: Payer: Self-pay | Admitting: Family Medicine

## 2014-09-29 ENCOUNTER — Telehealth: Payer: Self-pay | Admitting: Hematology & Oncology

## 2014-09-29 ENCOUNTER — Other Ambulatory Visit: Payer: Self-pay | Admitting: Physician Assistant

## 2014-09-29 ENCOUNTER — Ambulatory Visit (HOSPITAL_COMMUNITY): Payer: Medicare Other

## 2014-09-29 DIAGNOSIS — E2740 Unspecified adrenocortical insufficiency: Secondary | ICD-10-CM

## 2014-09-29 DIAGNOSIS — I509 Heart failure, unspecified: Secondary | ICD-10-CM

## 2014-09-29 DIAGNOSIS — J9601 Acute respiratory failure with hypoxia: Secondary | ICD-10-CM

## 2014-09-29 DIAGNOSIS — I350 Nonrheumatic aortic (valve) stenosis: Secondary | ICD-10-CM

## 2014-09-29 DIAGNOSIS — I5031 Acute diastolic (congestive) heart failure: Secondary | ICD-10-CM

## 2014-09-29 DIAGNOSIS — I10 Essential (primary) hypertension: Secondary | ICD-10-CM

## 2014-09-29 DIAGNOSIS — I5033 Acute on chronic diastolic (congestive) heart failure: Secondary | ICD-10-CM

## 2014-09-29 LAB — BASIC METABOLIC PANEL
Anion gap: 12 (ref 5–15)
BUN: 47 mg/dL — ABNORMAL HIGH (ref 6–20)
CHLORIDE: 99 mmol/L — AB (ref 101–111)
CO2: 27 mmol/L (ref 22–32)
Calcium: 8.5 mg/dL — ABNORMAL LOW (ref 8.9–10.3)
Creatinine, Ser: 3.49 mg/dL — ABNORMAL HIGH (ref 0.44–1.00)
GFR calc Af Amer: 14 mL/min — ABNORMAL LOW (ref >60)
GFR calc non Af Amer: 12 mL/min — ABNORMAL LOW (ref >60)
Glucose, Bld: 216 mg/dL — ABNORMAL HIGH (ref 65–99)
POTASSIUM: 4.1 mmol/L (ref 3.5–5.1)
SODIUM: 138 mmol/L (ref 135–145)

## 2014-09-29 MED ORDER — FUROSEMIDE 40 MG PO TABS: 40.0000 mg | ORAL_TABLET | Freq: Every day | ORAL | Status: DC

## 2014-09-29 NOTE — Discharge Summary (Signed)
Physician Discharge Summary  Nancy Blair DDU:[id number] DOB: 10-27-1935 DOA: 09/28/2014  PCP: Annye Asa, MD  Admit date: 09/28/2014 Discharge date: 09/29/2014  Time spent: 40 minutes  Recommendations for Outpatient Follow-up:  1. Follow-up with cardiology on 10/02/2014. 2. Check CBC/BMP on the next office visit.  Discharge Diagnoses:  Principal Problem:   Acute respiratory failure with hypoxia Active Problems:   Hypothyroidism   Hyperlipidemia   Accelerated hypertension   Lung transplant status, bilateral   Aortic stenosis, severe   CKD (chronic kidney disease) stage 4, GFR 15-29 ml/min   ACI (adrenal cortical insufficiency)   Long term current use of systemic steroids   Dementia   History of DVT right leg   S/P IVC filter   Pulmonary edema   Acute on chronic diastolic heart failure   Anemia of chronic disease   Discharge Condition: Stable  Diet recommendation: Heart healthy  Filed Weights   09/28/14 0817 09/28/14 1345 09/29/14 0636  Weight: 60.782 kg (134 lb) 59.5 kg (131 lb 2.8 oz) 57.834 kg (127 lb 8 oz)    History of present illness:  79 year old female with past medical history significant for bilateral lung transplant in 2376, chronic diastolic CHF, severe aortic stenosis, chronic kidney disease stage IV, hypertension, COPD, right lower extremity DVT, recent hospitalization from 09/04/2014 through 09/09/2014 for GI bleed and at that time Coumadin was placed on hold. IVC filter was placed 09/07/2014. Of note, last office visit with primary care was 09/21/2014. During that visit the discussion was about the palliative care consultation as patient and her daughter did not feel that there should be aggressive intervention especially when it comes to management of chronic kidney disease, specifically no dialysis. Patient presented to [facility] ED with reports of worsening shortness of breath which woke her up from sleep the night prior to this admission. She did  not have reports of chest pain at that time. No fevers or cough. In ED, patient was hypoxic with oxygen saturation of 89% in room air. This has improved with nasal cannula oxygen support up to 98%. Blood pressure initially was 232/109 but has subsequently improved to 140/116 with Lasix 40 mg IV 1 time dose. Chest x-ray on the admission showed mild vascular congestion, small bilateral loculated pleural effusion. Blood work was significant for hemoglobin of 9.2, creatinine 2.96, BNP more than 4500, troponin level 0.05. Patient was admitted for further evaluation of shortness of breath.  Hospital Course:   Acute respiratory failure with hypoxia / acute diastolic CHF / pulmonary vascular congestion - Patient presented with hypoxia and shortness of breath. This is likely secondary to acute diastolic CHF, pulmonary edema - Last 2 D ECHO in 07/2013 with grade 1 diastolic dysfunction and preserved EF - Patient was given one dose of Lasix 40 mg IV in ER and once seen by [facility] she received additional 60 mg of IV Lasix. - We subsequently resumed her lasix per home dose, 40 mg daily  - Continue carvedilol 6.25 mg PO BID, imdur 30 mg daily - Continue statin therapy - Appreciate cardiology consult and recommendations. - Patient received about 120 mg of IV Lasix in the ED, with diuresis over 4 L and total net negative of 3.3 L - Cardiology recommended to hold diuresis for today start 40 mg of Lasix. - Patient wants to go home, she will follow up in cardiology office in 3 days.  Active problems: Accelerated hypertension - Patient reports that she missed her medications this morning. - We will  resume patient's medications which include Norvasc, carvedilol, Imdur  - This is resolved, she is back to normal.  History of lung transplant - On chronic immunosuppressive therapy - Resume cortef 20 mg daily - Resume cyclosporine and mycophenolate - Resume prophylactic bactrim and acyclovir   Dyslipidemia -  Continue fenofibrate and Pravachol   Anemia of chronic kidney disease / Iron deficiency anemia - Hemoglobins table - No current indications for transfusion  - Resume iron supplementation    Procedures:  None  Consultations:  Cardiology  Discharge Exam: Filed Vitals:   09/29/14 0636  BP: 154/81  Pulse: 75  Temp: 98.1 F (36.7 C)  Resp: 16   General: Alert and awake, oriented x3, not in any acute distress. HEENT: anicteric sclera, pupils reactive to light and accommodation, EOMI CVS: S1-S2 clear, no murmur rubs or gallops Chest: clear to auscultation bilaterally, no wheezing, rales or rhonchi Abdomen: soft nontender, nondistended, normal bowel sounds, no organomegaly Extremities: no cyanosis, clubbing or edema noted bilaterally Neuro: Cranial nerves II-XII intact, no focal neurological deficits  Discharge Instructions   Discharge Instructions    Diet - low sodium heart healthy    Complete by:  As directed      Increase activity slowly    Complete by:  As directed           Current Discharge Medication List    CONTINUE these medications which have CHANGED   Details  furosemide (LASIX) 40 MG tablet Take 1 tablet (40 mg total) by mouth daily. Qty: 30 tablet, Refills: 0      CONTINUE these medications which have NOT CHANGED   Details  amLODipine (NORVASC) 2.5 MG tablet Take 1 tablet (2.5 mg total) by mouth daily at 10 pm. Qty: 30 tablet, Refills: 6    apixaban (ELIQUIS) 2.5 MG TABS tablet Take 1 tablet (2.5 mg total) by mouth daily. Qty: 30 tablet, Refills: 3    aspirin 81 MG tablet Take 81 mg by mouth daily.    busPIRone (BUSPAR) 15 MG tablet Take 1 tablet (15 mg total) by mouth 2 (two) times daily. Qty: 60 tablet, Refills: 6    carvedilol (COREG) 6.25 MG tablet Take 1 tablet (6.25 mg total) by mouth 2 (two) times daily with a meal. Qty: 60 tablet, Refills: 1    !! citalopram (CELEXA) 20 MG tablet Take 20 mg by mouth daily.    cycloSPORINE modified  (NEORAL) 25 MG capsule Take 125 mg by mouth 2 (two) times daily.     diphenoxylate-atropine (LOMOTIL) 2.5-0.025 MG per tablet Take 1-2 tablets by mouth See admin instructions. Take 2 tablets daily at lunch (2pm), may take 1 more tablet later in the day as needed for diarrhea    fenofibrate 160 MG tablet TAKE ONE TABLET BY MOUTH EVERY DAY Qty: 90 tablet, Refills: 1    ferrous sulfate 325 (65 FE) MG tablet Take 325 mg by mouth daily.     hydrocortisone (CORTEF) 20 MG tablet Take 20 mg by mouth 2 (two) times daily.     isosorbide mononitrate (IMDUR) 30 MG 24 hr tablet Take 1 tablet (30 mg total) by mouth daily. Qty: 90 tablet, Refills: 0    !! levothyroxine (SYNTHROID, LEVOTHROID) 88 MCG tablet Take 88 mcg by mouth daily.    Multiple Vitamin (MULITIVITAMIN WITH MINERALS) TABS Take 1 tablet by mouth daily.    mycophenolate (CELLCEPT) 500 MG tablet Take 500 mg by mouth 2 (two) times daily.     pantoprazole (PROTONIX) 40 MG  tablet Take 1 tablet (40 mg total) by mouth 2 (two) times daily. Qty: 60 tablet, Refills: 1    potassium chloride SA (K-DUR,KLOR-CON) 20 MEQ tablet Take 1 tablet (20 mEq total) by mouth daily. Qty: 5 tablet, Refills: 0    pravastatin (PRAVACHOL) 20 MG tablet Take 1 tablet (20 mg total) by mouth at bedtime. Qty: 30 tablet, Refills: 6    sulfamethoxazole-trimethoprim (BACTRIM,SEPTRA) 400-80 MG per tablet TAKE 1 TABLET BY MOUTH ONCE EVERY MONDAY, WEDNESDAY, AND FRIDAY    valGANciclovir (VALCYTE) 450 MG tablet Take 450 mg by mouth every Monday, Wednesday, and Friday.     vancomycin (VANCOCIN) 50 mg/mL oral solution Take 2.5 mLs (125 mg total) by mouth 4 (four) times daily. Qty: 130 mL, Refills: 0    zaleplon (SONATA) 5 MG capsule Take 5 mg by mouth daily at 6 PM.    !! citalopram (CELEXA) 40 MG tablet Take 1 tablet (40 mg total) by mouth at bedtime. Qty: 30 tablet, Refills: 3    !! levothyroxine (SYNTHROID, LEVOTHROID) 100 MCG tablet TAKE 1 TABLET BY MOUTH  DAILY Qty: 30 tablet, Refills: 6    Potassium Chloride ER 20 MEQ TBCR TAKE 1 TABLET BY MOUTH ONCE DAILY Qty: 30 tablet, Refills: 6    sulfamethoxazole-trimethoprim (BACTRIM DS,SEPTRA DS) 800-160 MG per tablet Take 1 tablet by mouth every Monday, Wednesday, and Friday. Qty: 36 tablet, Refills: 1     !! - Potential duplicate medications found. Please discuss with provider.     Allergies  Allergen Reactions  . Grapefruit Extract Other (See Comments)    Cannot take with meds  . Oysters [Shellfish Allergy] Swelling  . Pineapple Swelling  . Bupropion Other (See Comments)    sleepy  . Neomycin-Bacitracin Zn-Polymyx Hives and Other (See Comments)    Blisters (pt has used small amounts without a reaction)  . Penicillins Swelling    Facial swelling  . Prednisone Other (See Comments)    crazy   Follow-up Information    Follow up with Richardson Dopp, PA-C On 10/06/2014.   Specialties:  Physician Assistant, Radiology, Interventional Cardiology   Why:  11:30am   Contact information:   [address] [place] [address] [phone]       Follow up with [facility] Office On 10/02/2014.   Specialty:  Cardiology   Why:  4PM to obtain Echocardiogram to assess severe AS. Obtain lab BMET on the same day to monitor renal function   Contact information:   [address] [place] [phone]       The results of significant diagnostics from this hospitalization (including imaging, microbiology, ancillary and laboratory) are listed below for reference.    Significant Diagnostic Studies: Dg Chest 2 View  09/28/2014   CLINICAL DATA:  Shortness of breath, COPD  EXAM: CHEST  2 VIEW  COMPARISON:  04/27/2014  FINDINGS: Cardiomegaly is noted. Status post median sternotomy. Central mild vascular congestion and mild perihilar interstitial prominence suspicious for mild interstitial edema. Small bilateral loculated pleural  effusion with bilateral basilar atelectasis or infiltrate. Small amount of fluid noted in right major fissure.  Again noted atherosclerotic calcifications of thoracic aorta. Thoracic spine osteopenia. There is mild compression deformity in upper lumbar spine of indeterminate age.  IMPRESSION: Central mild vascular congestion and mild perihilar interstitial prominence suspicious for mild interstitial edema. Small bilateral loculated pleural effusion with bilateral basilar atelectasis or infiltrate. Small amount of fluid noted in right major  fissure. Mild compression deformity upper lumbar spine of indeterminate age. Clinical correlation is necessary   Electronically Signed   By: Lahoma Crocker M.D.   On: 09/28/2014 09:21   Ir Ivc Filter Plmt / S&i /img Guid/mod Sed  09/07/2014   CLINICAL DATA:  Lower extremity DVT. Exacerbation of lower GI bleed on anticoagulation, a relative contraindication. Caval filtration is requested. Renal insufficiency.  EXAM: INFERIOR VENACAVOGRAM  IVC FILTER PLACEMENT UNDER FLUOROSCOPY  FLUOROSCOPY TIME:  0.7 minutes, 153.79 uGym2 DAP  TECHNIQUE: The procedure, risks (including but not limited to bleeding, infection, organ damage ), benefits, and alternatives were explained to the patient. Questions regarding the procedure were encouraged and answered. The patient understands and consents to the procedure. Patency of the right IJ vein was confirmed with ultrasound with image documentation. An appropriate skin site was determined. Skin site was marked, prepped with chlorhexidine, and draped using maximum barrier technique. The region was infiltrated locally with 1% lidocaine.  Intravenous Fentanyl and Versed were administered as conscious sedation during continuous cardiorespiratory monitoring by the radiology RN, with a total moderate sedation time of 8 minutes.  Under real-time ultrasound guidance, the right IJ vein was accessed with a 21 gauge micropuncture needle; the needle tip within  the vein was confirmed with ultrasound image documentation. The needle was exchanged over a 018 guidewire for a transitional dilator, which allow advancement of the [facility] wire into the IVC. A long 6 French vascular sheath was placed for inferior venacavography using CO2. This demonstrated no caval thrombus. Renal vein inflows were evident.  The [facility] IVC filter was advanced through the sheath and successfully deployed under fluoroscopy at the L1-2 level. Followup CO2 cavagram demonstrates stable filter position and no evident complication. The sheath was removed and hemostasis achieved at the site. No immediate complication.  IMPRESSION: 1. Normal IVC. No thrombus or significant anatomic variation. 2. Technically successful infrarenal IVC filter placement. This is a retrievable model.   Electronically Signed   By: Lucrezia Europe M.D.   On: 09/07/2014 16:36    Microbiology: No results found for this or any previous visit (from the past 240 hour(s)).   Labs: Basic Metabolic Panel:  Recent Labs Lab 09/28/14 0858 09/29/14 0411  NA 138 138  K 4.2 4.1  CL 109 99*  CO2 22 27  GLUCOSE 105* 216*  BUN 39* 47*  CREATININE 2.96* 3.49*  CALCIUM 8.3* 8.5*   Liver Function Tests: No results for input(s): AST, ALT, ALKPHOS, BILITOT, PROT, ALBUMIN in the last 168 hours. No results for input(s): LIPASE, AMYLASE in the last 168 hours. No results for input(s): AMMONIA in the last 168 hours. CBC:  Recent Labs Lab 09/28/14 0858  WBC 9.5  NEUTROABS 8.1*  HGB 9.2*  HCT 28.7*  MCV 99.7  PLT 213   Cardiac Enzymes: No results for input(s): CKTOTAL, CKMB, CKMBINDEX, TROPONINI in the last 168 hours. BNP: BNP (last 3 results)  Recent Labs  09/28/14 0858  BNP >4500.0*    ProBNP (last 3 results) No results for input(s): PROBNP in the last 8760 hours.  CBG: No results for input(s): GLUCAP in the last 168 hours.     Signed:  Landyn Buckalew A  [facility] 09/29/2014, 11:23 AM

## 2014-09-29 NOTE — Telephone Encounter (Signed)
Caller name: Gwinda Passe at [facility]osp[facility] Relation to pt: Call back number: [phone] Pharmacy:  Reason for call:   Has questions for a nurse before referral can be processed.

## 2014-09-29 NOTE — Telephone Encounter (Signed)
Spoke with vickie at Hospice she advised that their physician's will not be attending, however they will assist with symptom management. Verbal order given for evaluation.

## 2014-09-29 NOTE — Telephone Encounter (Signed)
Nurse would like to know if you will be attending for patient for symptom management.  She will need an order for hospice evaluation please.

## 2014-09-29 NOTE — Progress Notes (Signed)
Pt has order for enteric precautions. Pt has been having formed stools, no complaints of diarrhea. Unable to get Cdiff sample at this time. Per chart pt has a hx of Cdiff positive stool sample on 5/22. Pt remains on enteric precautions at this time. Pt educated.

## 2014-09-29 NOTE — Evaluation (Signed)
Physical Therapy Evaluation/ Discharge Patient Details Name: Nancy Blair MRN: [id number] DOB: 03-13-1936 Today's Date: 09/29/2014   History of Present Illness  79 year old female with past medical history significant for bilateral lung transplant in 5093, chronic diastolic CHF, severe aortic stenosis, chronic kidney disease stage IV, hypertension, COPD, right lower extremity DVT, recent hospitalization from 09/04/2014 through 09/09/2014 for GI bleed. Pt admitted with CHF and respiratory distress  Clinical Impression  Pt very pleasant and moving very well. Pt with history of falls and encouraged rollator use for safety with ambulation at home. Pt encouraged to continue ambulating with staff supervision acutely and reports no strength, functional or gait deficits currently. No further needs with pt aware and agreeable to therapy signing off.     Follow Up Recommendations No PT follow up    Equipment Recommendations  None recommended by PT    Recommendations for Other Services       Precautions / Restrictions Precautions Precautions: Fall Restrictions Weight Bearing Restrictions: No      Mobility  Bed Mobility Overal bed mobility: Modified Independent                Transfers Overall transfer level: Modified independent                  Ambulation/Gait Ambulation/Gait assistance: Modified independent (Device/Increase time) Ambulation Distance (Feet): 300 Feet Assistive device: None Gait Pattern/deviations: Step-through pattern;Decreased stride length   Gait velocity interpretation: at or above normal speed for age/gender General Gait Details: steady gait without use of RW and recommended continued use of rollator at home due to risk of falls  Stairs            Wheelchair Mobility    Modified Rankin (Stroke Patients Only)       Balance     Sitting balance-Leahy Scale: Good       Standing balance-Leahy Scale: Good                                Pertinent Vitals/Pain Pain Assessment: No/denies pain    Home Living Family/patient expects to be discharged to:: Assisted living Living Arrangements: Alone   Type of Home: Assisted living Home Access: Level entry     Home Layout: One level Home Equipment: Clinical cytogeneticist - 4 wheels      Prior Function Level of Independence: Independent with assistive device(s)         Comments: pt performs her own ADLs with use of rollator of the the apt and Staff at [employer] for all meals and housework     Hand Dominance        Extremity/Trunk Assessment   Upper Extremity Assessment: Overall WFL for tasks assessed           Lower Extremity Assessment: Overall WFL for tasks assessed         Communication   Communication: No difficulties  Cognition Arousal/Alertness: Awake/alert Behavior During Therapy: WFL for tasks assessed/performed                        General Comments      Exercises        Assessment/Plan    PT Assessment Patent does not need any further PT services  PT Diagnosis     PT Problem List    PT Treatment Interventions     PT Goals (Current goals can be found in the Care  Plan section) Acute Rehab PT Goals PT Goal Formulation: All assessment and education complete, DC therapy    Frequency     Barriers to discharge        Co-evaluation               End of Session   Activity Tolerance: Patient tolerated treatment well Patient left: in bed;with call bell/phone within reach (returned to bed for test) Nurse Communication: Mobility status         Time: 0037-0488 PT Time Calculation (min) (ACUTE ONLY): 11 min   Charges:   PT Evaluation $Initial PT Evaluation Tier I: 1 Procedure     PT G CodesMelford Aase 09/29/2014, 11:42 AM Elwyn Reach, [place]

## 2014-09-29 NOTE — Progress Notes (Signed)
Patient referred to [facility] by Dr. [name]. This RN spoke with pt's RN Mearl Latin and both daughters by phone. Pt. being discharged back to [facility] this afternoon and hospice will admit patient to hospice services on 10/03/14 per dtr. [name]'s request. Family declines any DME at this time. [facility] contact information given to [facility] for questions. Family reports pt. Has a DNR in place. Please call with any questions.   [facility] Liaison [phone]

## 2014-09-29 NOTE — Telephone Encounter (Signed)
Caller name: Nancy Blair with [facility] Can be reached: [phone]  Reason for call: Initial referral sent was for palliative care. Pt is currently at [facility]. She has been evaluated by palliative care team and is eligible for hospice. Nancy Blair is needing to know if it is ok to admit pt under Hospice care. Please notify her.

## 2014-09-29 NOTE — Progress Notes (Signed)
Echocardiogram 2D Echocardiogram has been performed.  Nancy Blair 09/29/2014, 12:57 PM

## 2014-09-29 NOTE — Progress Notes (Signed)
Patient Name: Nancy Blair Date of Encounter: 09/29/2014  Primary Cardiologist: Dr Ron Parker  Principal Problem:   Acute respiratory failure with hypoxia Active Problems:   Hypothyroidism   Hyperlipidemia   Accelerated hypertension   Lung transplant status, bilateral   Aortic stenosis, severe   CKD (chronic kidney disease) stage 4, GFR 15-29 ml/min   ACI (adrenal cortical insufficiency)   Long term current use of systemic steroids   Dementia   History of DVT right leg   S/P IVC filter   Pulmonary edema   Acute on chronic diastolic heart failure   Anemia of chronic disease    SUBJECTIVE  Denies any CP. SOB resolved. No LE edema.  CURRENT MEDS . amLODipine  2.5 mg Oral Daily  . aspirin EC  81 mg Oral Daily  . busPIRone  15 mg Oral BID  . carvedilol  6.25 mg Oral BID WC  . citalopram  20 mg Oral Daily  . cycloSPORINE modified  125 mg Oral BID  . diphenoxylate-atropine  1-2 tablet Oral See admin instructions  . fenofibrate  160 mg Oral QHS  . ferrous sulfate  325 mg Oral Daily  . furosemide  40 mg Oral Daily  . hydrocortisone  20 mg Oral BID  . isosorbide mononitrate  30 mg Oral Daily  . levothyroxine  88 mcg Oral Daily  . multivitamin with minerals  1 tablet Oral Daily  . mycophenolate  500 mg Oral BID  . pantoprazole  40 mg Oral BID  . potassium chloride SA  20 mEq Oral Daily  . pravastatin  20 mg Oral QHS  . sodium chloride  3 mL Intravenous Q12H  . sulfamethoxazole-trimethoprim  1 tablet Oral Once per day on Mon Wed Fri  . valGANciclovir  450 mg Oral Q M,W,F    OBJECTIVE  Filed Vitals:   09/28/14 2012 09/28/14 2053 09/29/14 0238 09/29/14 0636  BP: 130/70 130/67 136/69 154/81  Pulse:  76 79 75  Temp:  97.8 F (36.6 C) 98 F (36.7 C) 98.1 F (36.7 C)  TempSrc:  Oral Oral Oral  Resp:  18 17 16   Height:      Weight:    127 lb 8 oz (57.834 kg)  SpO2:  93% 95% 94%    Intake/Output Summary (Last 24 hours) at 09/29/14 0900 Last data filed at 09/29/14  0841  Gross per 24 hour  Intake    620 ml  Output   4000 ml  Net  -3380 ml   Filed Weights   09/28/14 0817 09/28/14 1345 09/29/14 0636  Weight: 134 lb (60.782 kg) 131 lb 2.8 oz (59.5 kg) 127 lb 8 oz (57.834 kg)    PHYSICAL EXAM  General: Pleasant, NAD. Neuro: Alert and oriented X 3. Moves all extremities spontaneously. Psych: Normal affect. HEENT:  Normal  Neck: Supple without bruits or JVD. Lungs:  Resp regular and unlabored, CTA. Heart: RRR no s3, s4. 3/6 systolic murmur at Upper sternal border Abdomen: Soft, non-tender, non-distended, BS + x 4.  Extremities: No clubbing, cyanosis or edema. DP/PT/Radials 2+ and equal bilaterally.  Accessory Clinical Findings  CBC  Recent Labs  09/28/14 0858  WBC 9.5  NEUTROABS 8.1*  HGB 9.2*  HCT 28.7*  MCV 99.7  PLT 258   Basic Metabolic Panel  Recent Labs  09/28/14 0858 09/29/14 0411  NA 138 138  K 4.2 4.1  CL 109 99*  CO2 22 27  GLUCOSE 105* 216*  BUN 39* 47*  CREATININE 2.96*  3.49*  CALCIUM 8.3* 8.5*    TELE NSR without significant ventricular ectopy    ECG  No new EKG  Echocardiogram 07/16/2013  LV EF: 60% -  65%  ------------------------------------------------------------ Indications:   Dyspnea 786.09.  ------------------------------------------------------------ History:  PMH: Bilateral lung transplant Chronic obstructive pulmonary disease. Risk factors: Hypertension. Diabetes mellitus. Dyslipidemia.  ------------------------------------------------------------ Study Conclusions  - Left ventricle: The cavity size was normal. Wall thickness was increased in a pattern of mild LVH. Systolic function was normal. The estimated ejection fraction was in the range of 60% to 65%. Wall motion was normal; there were no regional wall motion abnormalities. Doppler parameters are consistent with abnormal left ventricular relaxation (grade 1 diastolic dysfunction). Doppler parameters  are consistent with high ventricular filling pressure. - Aortic valve: There was moderate to severe stenosis. Valve area: 0.75cm^2(VTI). Valve area: 0.86cm^2 (Vmax). - Mitral valve: Calcified annulus. Mild regurgitation. - Left atrium: The atrium was moderately dilated. - Pulmonary arteries: Systolic pressure was mildly increased. Impressions:  - Normal LV function; calcified aortic valve with moderate to severe AS (mean gradient 36 mmHg).    Radiology/Studies  Dg Chest 2 View  09/28/2014   CLINICAL DATA:  Shortness of breath, COPD  EXAM: CHEST  2 VIEW  COMPARISON:  04/27/2014  FINDINGS: Cardiomegaly is noted. Status post median sternotomy. Central mild vascular congestion and mild perihilar interstitial prominence suspicious for mild interstitial edema. Small bilateral loculated pleural effusion with bilateral basilar atelectasis or infiltrate. Small amount of fluid noted in right major fissure.  Again noted atherosclerotic calcifications of thoracic aorta. Thoracic spine osteopenia. There is mild compression deformity in upper lumbar spine of indeterminate age.  IMPRESSION: Central mild vascular congestion and mild perihilar interstitial prominence suspicious for mild interstitial edema. Small bilateral loculated pleural effusion with bilateral basilar atelectasis or infiltrate. Small amount of fluid noted in right major fissure. Mild compression deformity upper lumbar spine of indeterminate age. Clinical correlation is necessary   Electronically Signed   By: Lahoma Crocker M.D.   On: 09/28/2014 09:21   Ir Ivc Filter Plmt / S&i /img Guid/mod Sed  09/07/2014   CLINICAL DATA:  Lower extremity DVT. Exacerbation of lower GI bleed on anticoagulation, a relative contraindication. Caval filtration is requested. Renal insufficiency.  EXAM: INFERIOR VENACAVOGRAM  IVC FILTER PLACEMENT UNDER FLUOROSCOPY  FLUOROSCOPY TIME:  0.7 minutes, 153.79 uGym2 DAP  TECHNIQUE: The procedure, risks (including but  not limited to bleeding, infection, organ damage ), benefits, and alternatives were explained to the patient. Questions regarding the procedure were encouraged and answered. The patient understands and consents to the procedure. Patency of the right IJ vein was confirmed with ultrasound with image documentation. An appropriate skin site was determined. Skin site was marked, prepped with chlorhexidine, and draped using maximum barrier technique. The region was infiltrated locally with 1% lidocaine.  Intravenous Fentanyl and Versed were administered as conscious sedation during continuous cardiorespiratory monitoring by the radiology RN, with a total moderate sedation time of 8 minutes.  Under real-time ultrasound guidance, the right IJ vein was accessed with a 21 gauge micropuncture needle; the needle tip within the vein was confirmed with ultrasound image documentation. The needle was exchanged over a 018 guidewire for a transitional dilator, which allow advancement of the [facility] 352 wire into the IVC. A long 6 French vascular sheath was placed for inferior venacavography using CO2. This demonstrated no caval thrombus. Renal vein inflows were evident.  The [facility] IVC filter was advanced through the sheath and successfully deployed under fluoroscopy  at the L1-2 level. Followup CO2 cavagram demonstrates stable filter position and no evident complication. The sheath was removed and hemostasis achieved at the site. No immediate complication.  IMPRESSION: 1. Normal IVC. No thrombus or significant anatomic variation. 2. Technically successful infrarenal IVC filter placement. This is a retrievable model.   Electronically Signed   By: Lucrezia Europe M.D.   On: 09/07/2014 16:36    ASSESSMENT AND PLAN  1. Dyspnea: multifactorial, h/o lung transplant, wheezing, diastolic CHF, HTN urgency and likely severe AS  2. Moderate to severe AS: likely severe AS based on physical exam, echo pending  - may need TAVR, she is not candidate  for AVR  3. Acute on chronic diastolic HF  - -3L after aggressive diuresis with 120mg  total IV lasix yesterday, Cr trended up.   - lung clear this morning, no JVD, no LE edema. She is euvolemic. Will hold PO lasix, and start tomorrow, allow her renal function to recover first.  - avoid over diuresis in pt with severe AS which may result in significant drop in perfusion.  Addendum: talked with Dr. Hartford Poli, patient insist on going home, still would recommend hold off on 40mg  daily lasix until tomorrow. Will arrange 1 week BMET and outpatient followup. Ideally need inpatient echo to assess degree to severe AS, however since patient insist on leaving, will arrange as outpatient.  4. HTN urgency  - continue Imdur and amlodipine. Coreg held initially due to wheezing, restarted last night, wheezing resolved.   - can consider addition of hydralazine as well to help decrease afterload  5. H/o DVT s/p IVC filter 09/07/2014 on eliquis  6. Acute on chronic renal insufficiency stage IV/V  - likely related to aggressive diuresis, received 20+40+60mg  of IV lasix yesterday  Signed, Woodward Ku Pager: [phone]   Agree with note by Almyra Deforest PA-C  Pt admitted for HTN urgency, SOB. CHF. She has severe AS with preserved LV fxn. Good diuresis. CRI. SCr increased. She is A DNR. Not a surgical candidate. May be a TAVR candidate. Her Cardiologist is Daryel November. Prob OK for DC home on Lasix 40 mg PO qDay. Will need TOC 7, follow up with Dr. Ron Parker after that and appointment with TAVR MDs for evaluation.    Lorretta Harp, M.D., [place], [facility], [place] [address] [place], [place]  [address]  [phone] 09/29/2014 10:58 AM

## 2014-09-29 NOTE — Telephone Encounter (Signed)
Lft msg for pt confirming NP, FC/labs/ov .Marland Kitchen... KJ

## 2014-09-29 NOTE — Consult Note (Signed)
   [facility] CM Inpatient Consult   09/29/2014  Nancy Blair 01-Jun-1935 [id number] Patient evaluated for community based chronic disease management services with [place] [facility] Program as a benefit of patient's [facility] Medicare Insurance. Spoke with patient at bedside to explain [place] Management services. Consent form signed. Patient states she has had Home Health in the past and anticipate [place] when she returns as well.   Patient will receive post discharge transition of care call and will be evaluated for monthly home visits for assessments and disease process education.  Left contact information and [facility] literature at bedside. Made Inpatient Case Manager aware that [place] Management following. Of note, [facility] Management services does not replace or interfere with any services that are arranged by inpatient case management or social work.  For additional questions or referrals please contact:   Natividad Brood, RN BSN [facility] Liaison  [phone] business mobile phone

## 2014-09-29 NOTE — Telephone Encounter (Signed)
Can you please find out what questions nurse has?

## 2014-09-29 NOTE — Telephone Encounter (Signed)
If they have a physician on staff, I would prefer them to be the attending but otherwise I can certainly do it.  And OK for order for Hospice evaluation

## 2014-09-29 NOTE — Progress Notes (Signed)
OT Cancellation Note  Patient Details Name: Nancy Blair MRN: [id number] DOB: [birth year]   Cancelled Treatment:    Reason Eval/Treat Not Completed: OT screened, no needs identified, will sign off. Pt is performing ADL and mobility at her baseline and is independent in mobility per PT note and conversation with pt.  Pt to discharge this afternoon.  [name] 09/29/2014, 3:29 PM

## 2014-09-30 NOTE — Telephone Encounter (Signed)
Please advise 

## 2014-09-30 NOTE — Telephone Encounter (Signed)
Already taken care of by CMA

## 2014-09-30 NOTE — Telephone Encounter (Signed)
Per phone note on 09-29-14, this verbal order was given to [place] at [facility]. Called and spoke with Loletha Carrow today and she advised that this was ok, pt will be admitted to hospice on Saturday when her daughter from [place] can be in attendance.

## 2014-10-02 ENCOUNTER — Other Ambulatory Visit: Payer: Medicare Other

## 2014-10-02 ENCOUNTER — Telehealth: Payer: Self-pay | Admitting: Hematology & Oncology

## 2014-10-02 ENCOUNTER — Other Ambulatory Visit: Payer: Self-pay

## 2014-10-02 ENCOUNTER — Other Ambulatory Visit (HOSPITAL_COMMUNITY): Payer: Medicare Other

## 2014-10-02 NOTE — Patient Outreach (Signed)
Unsuccessful attempt made to contact patient for transition of care call. Per EPIC, patient was discharged from acute care yesterday, June 16.   Plan: Make another attempt to contact patient for transition of care on Monday, June 20.

## 2014-10-02 NOTE — Telephone Encounter (Signed)
DAUGHTER  CALLED TO CANCEL DUE TO MOTHER GOING ON PALLIATIVE CARE

## 2014-10-05 ENCOUNTER — Ambulatory Visit: Payer: Medicare Other | Admitting: Family

## 2014-10-05 ENCOUNTER — Other Ambulatory Visit: Payer: Medicare Other

## 2014-10-05 ENCOUNTER — Other Ambulatory Visit: Payer: Self-pay

## 2014-10-05 ENCOUNTER — Ambulatory Visit: Payer: Medicare Other

## 2014-10-05 NOTE — Patient Outreach (Signed)
Unsuccessful attempts x2 made to contact patient today, as well as Friday, June 17. Per EPIC EHR, patient has a referral to Palliative Care and is currently a resident at [facility].  Plan: Make another attempt to contact patient via telephone on October 07, 2014

## 2014-10-06 ENCOUNTER — Encounter: Payer: Medicare Other | Admitting: Physician Assistant

## 2014-10-06 NOTE — Patient Outreach (Signed)
[facility]) Care Management  10/06/2014  Nancy Blair 05-09-1935 [id number]   Notification patient was admitted to Hospice/Pallactive care.  Closed at this time for [facility] Care Management.  Ronnell Freshwater. [place], [place] Management [place] Assistant Phone: [phone] Fax: [phone]

## 2014-10-07 ENCOUNTER — Telehealth: Payer: Self-pay | Admitting: Family Medicine

## 2014-10-07 ENCOUNTER — Other Ambulatory Visit: Payer: Self-pay

## 2014-10-07 NOTE — Telephone Encounter (Signed)
Caller name: Dayton Martes with [facility] Can be reached: [phone]  Reason for call: Pt had a fall and has fairly significant skin tears on her left hand. Milus Banister would like to discuss treatment options and verbal order. Please call asap.

## 2014-10-07 NOTE — Patient Outreach (Signed)
Unsuccessful attempt to make contact patient via telephone by using all telephone numbers listed as patient contacts and emergency contacts.   This is #3 attempt.  Plan: Send patient letter requesting contact. Contact primary care physician to advise of inability to contact patient.

## 2014-10-08 ENCOUNTER — Telehealth: Payer: Self-pay | Admitting: Behavioral Health

## 2014-10-08 ENCOUNTER — Telehealth: Payer: Self-pay

## 2014-10-08 NOTE — Telephone Encounter (Signed)
Orders faxed.  Orders numbered and placed in Dr. Virgil Benedict red folder for review and signature.

## 2014-10-08 NOTE — Telephone Encounter (Signed)
Caller: Dayton Martes, nurse, Hospice & Palliative at [facility]  Reason for call: Follow-up advice regarding the patient's skin tear  Nancy Blair reported that the patient's knuckles are not deformed, as well as no signs or symptoms of pain. Writer informed the nurse to continue applying vaseline gauze  per the provider's note; she understood the instructions and did not have any further questions or concerns.

## 2014-10-08 NOTE — Telephone Encounter (Signed)
Nurse called back and spoke to [place], Therapist, sports. See telephone note for details.

## 2014-10-08 NOTE — Telephone Encounter (Signed)
If laceration is minor, can continue w/ Vasoline gauze.  Does she have a lot of pain or deformity of the knuckle?  She may need imaging to r/o a fracture

## 2014-10-08 NOTE — Telephone Encounter (Signed)
Pt fell on Tuesday and injured middle finger and knuckle on left hand on a sliding glass door.  She did not fall through door, but may have injured finger on the metal.  She sustain a minor laceration on the middle finger and a skin tear on the knuckle.  Nurse there cleansed the area and placed a dry bandage on injured site.  Clarene Critchley came in and removed dry gauze, reopening wound.  Clarene Critchley applied a Vaseline gauze and wrapped it with Kerlix.  She is calling today because she wants to know should she continue this treatment or provide an alternative treatment option.    Please advise.

## 2014-10-08 NOTE — Telephone Encounter (Signed)
Can you please call and triage. Need to know how bad cut is before we can find options

## 2014-10-08 NOTE — Telephone Encounter (Signed)
Left a message on nurse's phone for call back.

## 2014-10-12 ENCOUNTER — Other Ambulatory Visit: Payer: Medicare Other

## 2014-10-12 NOTE — Telephone Encounter (Signed)
PA approved.

## 2014-11-09 NOTE — Telephone Encounter (Signed)
Orders signed and faxed back.

## 2014-11-11 ENCOUNTER — Other Ambulatory Visit: Payer: Self-pay | Admitting: Family Medicine

## 2014-11-11 NOTE — Telephone Encounter (Signed)
Pt has been taking 1/2 tab- 15mg  daily

## 2014-11-11 NOTE — Telephone Encounter (Signed)
I reviewed last OV notes from 09/21/2014 regarding Pt's Isosorbide. Just wanted to clarify if Pt should be taking 15 mg or 30 mg. Please advise. Thank you.

## 2014-11-11 NOTE — Telephone Encounter (Signed)
Rx refilled, #45 0RF or 3 month supply.

## 2014-11-12 ENCOUNTER — Other Ambulatory Visit: Payer: Self-pay

## 2014-11-12 DIAGNOSIS — C44329 Squamous cell carcinoma of skin of other parts of face: Secondary | ICD-10-CM | POA: Diagnosis not present

## 2014-11-12 MED ORDER — PANTOPRAZOLE SODIUM 40 MG PO TBEC: 40.0000 mg | DELAYED_RELEASE_TABLET | Freq: Two times a day (BID) | ORAL | Status: DC

## 2014-11-26 ENCOUNTER — Encounter: Payer: Self-pay | Admitting: Family Medicine

## 2014-11-26 NOTE — Progress Notes (Signed)
Noted  

## 2014-12-08 ENCOUNTER — Telehealth: Payer: Self-pay | Admitting: Family Medicine

## 2014-12-08 NOTE — Telephone Encounter (Signed)
Spoke with Milus Banister and she advised that Dr. Shelbie Proctor will handle all symptom management from here out, including x-ray and abx. Natasha Mead advised that family was notified that a bed was available at assisted living and they will make that call within the next few days wether or not she will place.

## 2014-12-08 NOTE — Telephone Encounter (Signed)
Called and left messages with both of Pt's daughter's to inform that due to findings Dr. [name] would like pt to come in for evaluation to rule out Pneumonia and other infections. This appt can be with any of the providers here. Pt should be seen in the next 1-2 days.

## 2014-12-08 NOTE — Telephone Encounter (Signed)
Please call and get more info- there are no BPs, O2 sats, or pulse rates recorded.  If pt is symptomatically SOB w/ exertion, can use 2L via nasal cannula.

## 2014-12-08 NOTE — Telephone Encounter (Signed)
FYI, please advise if O2 is needed, should that come from you or hospice provider?

## 2014-12-08 NOTE — Telephone Encounter (Signed)
Spoke with [facility] hospice nurse. She advised that vitals today were:   BP - 130/84 O2 - 93 P- 88-90 and irregular T - 99.4  Pt is complaining of unproductive cough, shortness of breath with exertion from moving on the couch. Nancy Blair states she heard a fine wheeze in pt back and coarser wheeze in the chest.   Please advise.

## 2014-12-08 NOTE — Telephone Encounter (Signed)
Noted.  If not able to take BP please document O2 sats, pulse.  If pt is symptomatically short of breath w/ exertion, should wear O2 via [place] at 2 L to improve symptoms.

## 2014-12-08 NOTE — Telephone Encounter (Signed)
Therese from hospice is calling back stating that the dr. Earlie Counts states that the effort of getting the pt to the office is not good based on pt's condition, so wants to know if dr. [name] is ok with getting a portable chest x-ray at the facility and if she needs an antibiotic they can order that as well, states the x-ray will show what ever else they need to do to care for the pt but wanted to know if Dr. Darene Lamer is okay with that. However states if the daughter is not agreeable with that then of course they will change the plan. The doctor's concern was having to move the pt at this time. Please call her with any questions.

## 2014-12-08 NOTE — Telephone Encounter (Signed)
Caller name: Nancy Blair with Hospice Can be reached: [phone]  Reason for call: Pt got up and dressed yesterday and got short of breath. EMS was called to the home. Pt did not want to go to ER. Hospice doc out to home yesterday. 2+ to right leg edema. Did not increase lasix. After 30 minutes pt was settled, 22-24 respirations, some wheezing. After 10 feet pt was very tired with exertion. Doc feels like progression of renal disease. Very tired. Oxycodone was ordered for comfort 2.5mg  dose 2 x day standing order. & 2.5mg  q4h prn for sob or pain. Tolerance to activity has decreased. Working on left knee tear that is healing. Hospice nurse coming to home today. Daughter is looking into private care. Face has a grayish look typical of kidney patients. [name] watching to see if oxygen needs added with exertion and to see how pt responds to oxycodone.

## 2014-12-08 NOTE — Telephone Encounter (Signed)
I am fine w/ portable chest xray and hospice ordering abx if needed.  If Dr Shelbie Proctor is able to write those orders, it would be better for her to manage her Hospice care if possible

## 2014-12-08 NOTE — Telephone Encounter (Signed)
Spoke with nurse, she advised that she will be with Ms. Fawcett in a few minutes. Asked for her to document BP, O2, and pulse and call and let us know, verbal order given for 2L of O2 through nasal cannula.   Nurse advised that pt has been intolerant of BP cuff in the last few days due to bruising and pain. Pulse the last few days has been running in the 64-66 range.

## 2014-12-08 NOTE — Telephone Encounter (Signed)
Based on RN's assessment, pt needs OV to r/o PNA or other infxn.  Please call family and encourage them to schedule

## 2014-12-11 ENCOUNTER — Telehealth: Payer: Self-pay | Admitting: Family Medicine

## 2014-12-11 NOTE — Telephone Encounter (Signed)
FYI,

## 2014-12-11 NOTE — Telephone Encounter (Signed)
Caller name:The[facility] Relation to K[facility] Call back number:[phone] Pharmacy:  Reason for call: 12/08/14 hospice ordered a portable chest x-ray pt had some beginning fluid in lungs and beginning pnuemonia the dr put pt on 20 mg daily of lasix and started levaquin antibiotic 500 mg tab 1 daily for 10 days. Pt states she is feeling better with no fever/

## 2014-12-14 DIAGNOSIS — C44329 Squamous cell carcinoma of skin of other parts of face: Secondary | ICD-10-CM | POA: Diagnosis not present

## 2014-12-21 ENCOUNTER — Other Ambulatory Visit: Payer: Self-pay | Admitting: Family Medicine

## 2014-12-22 NOTE — Telephone Encounter (Signed)
Medication filled to pharmacy as requested.   

## 2014-12-28 ENCOUNTER — Telehealth: Payer: Self-pay | Admitting: Family Medicine

## 2014-12-28 NOTE — Telephone Encounter (Signed)
[facility] Ph# [phone]  Pt is having increase confusion. Hosp/Pall doctor gave pt cipro 250mg  2x day for 7 days. This started on 12/23/14. Pts daughter did not want cath. They did hold Bactrim while on cipro. Bactrim to start back on 9/15. Pt is very sleepy, sob at time, have given neb treatments. She is weakening and declining but no change in pts confusion. No drinking much at all. Resting and sleeping more. Lasix 20mg  3xweek.

## 2014-12-28 NOTE — Telephone Encounter (Signed)
Noted  

## 2014-12-30 ENCOUNTER — Telehealth: Payer: Self-pay | Admitting: Family Medicine

## 2014-12-30 NOTE — Telephone Encounter (Signed)
Caller name: Milus Banister from [facility] [place] Relation to pt: RN Call back number: [phone]  Reason for call:  RN wanted to inform PCP patient is requiring 24 hour care due to falls and weakness. RN would like to discuss medication changes please follow up.

## 2014-12-30 NOTE — Telephone Encounter (Signed)
Talked to [facility] who advised that Bp was 100/60.   They are reducing Coreg to 6.25mg  celexa 20mg  daily Stop Norvasc Stop Imdur Stop Synthroid Stop multivitamin Stop Lasix Stop Potassium  Chart updated to reflect

## 2014-12-30 NOTE — Telephone Encounter (Signed)
Noted! Thank you

## 2015-01-06 ENCOUNTER — Encounter: Payer: Self-pay | Admitting: Gastroenterology

## 2015-01-12 ENCOUNTER — Telehealth: Payer: Self-pay | Admitting: Family Medicine

## 2015-01-12 NOTE — Telephone Encounter (Signed)
FYI

## 2015-01-12 NOTE — Telephone Encounter (Signed)
Caller name: Christianne Borrow Hospice Therapist   Relationship to patient:   Can be reached: [phone] Pharmacy:  Reason for call: Last week BP dropped to 85 systolic . Daughter discussed anti regection meds, but kept [place]. Yesterday seen pt she seemed fine. She walked around by her self. She will send over a message from the office as well.

## 2015-01-27 ENCOUNTER — Telehealth: Payer: Self-pay | Admitting: Family Medicine

## 2015-01-27 NOTE — Telephone Encounter (Signed)
Caller name: Dayton Martes Can be reached: [phone]  Reason for call: [facility] report, made med changes this week for pt. Took her off antirejection and BP meds. She had a good week of eating well and being more active, but this week she has been in the bed or on the couch all week. She's had increased wheezing but pt is not taking neb treatments (supposed to be TID but only uses prn) or using oxygen. Her o2 sat has been around 91%. BP 01/26/15 160/104 and pulse 120-140. Pt was started back on lasix 20mg  daily and back on coreg 3.125mg  daily. Nurse will f/u end of this week to check BP and may increase med to 2/day. She may be transferred to assisted living soon. All wounds are closed up and has new dressings.

## 2015-01-27 NOTE — Telephone Encounter (Signed)
FYI

## 2015-01-28 NOTE — Patient Outreach (Signed)
[place] [facility]) Care Management  01/28/2015  ROCHELE LUECK 03/31/1936 [id number]   Referral from [place] assigned Mariann Laster, RN to outreach for [place] [facility] services.  Thanks, Ronnell Freshwater. [place], [place] Assistant Phone: [phone] Fax: [phone]

## 2015-02-04 ENCOUNTER — Other Ambulatory Visit: Payer: Self-pay

## 2015-02-04 NOTE — Patient Outreach (Signed)
[facility]) Care Management  02/04/2015  KAILEE ESSMAN 07/27/1935 [id number]   Outreach call to patient's caregiver/daughter/[name] Nyoka Cowden; not reached.   RN CM left HIPAA compliant voice message with name and number requesting call back.  RN CM will schedule for next outreach call.   Mariann Laster, RN, BSN, [facility], CCM  [employer] Management Coordinator [phone] Direct [phone] Cell [phone] Office [phone] Fax

## 2015-02-04 NOTE — Patient Outreach (Signed)
[place] [facility]) Care Management  02/04/2015  Nancy Blair November 20, 1935 [id number]  Referral Date:  01/28/2015 Referral Source:  [facility] Referral Issue:  CHF, CKD, COPD, DM HTN 2 admissions during report period June 2015 - May 2016  Patient states she lives in a ALF but is not able to recall the name of the facility.   Caregiver:   Daughter, Bennie Dallas Patient confirms she has the above health conditions but states she is doing just fine and does not need anything.   Patient is unable to give specifics on assessment today due to poor memory.  Patient states she does have [place] services and repeated again she was doing fine.   Consent Patient gives consent for RN CM to contact daughter to discuss [facility] services and PHI.    Plan: Per Epic MR:  H/o active with [facility] [place].   RN CM will continue outreach attempts for [facility] screening to daughter, Bennie Dallas.    Mariann Laster, RN, BSN, [facility], CCM  [employer] Management Coordinator [phone] Direct [phone] Cell [phone] Office [phone] Fax

## 2015-02-09 ENCOUNTER — Telehealth: Payer: Self-pay | Admitting: Family Medicine

## 2015-02-09 NOTE — Telephone Encounter (Signed)
Noted  

## 2015-02-09 NOTE — Telephone Encounter (Signed)
Caller name: Clarene Critchley  Relation to pt: RN hospice of [employer] back number: [phone]    Reason for call:  Hospice RN would like to discuss patient condition and new medications prescribed, please follow up.

## 2015-02-09 NOTE — Telephone Encounter (Signed)
FYI  Called and spoke with Nancy Blair with Hospice. Kidney failure is predominant due to stopping anti-rejection medication. Nancy Blair that Nancy Blair, Nancy Blair, Nancy 3/125 (bp was 189/90), Nancy Blair.   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 Hospital bed is being ordered for Nancy so she can sit up to sleep. Nancy has not been given fluent nebulizer treatments, this was due to family not knowing the machine was broken. Rn for hospice had this fixed. Nancy is to be moved to assisted living on November 4th. Nancy did have a home health caregiver Blair for 2 weeks after 2 weeks family seen improvement in Nancy and cancelled the caregiver. New caregiver comes in 1/week just to check on Nancy. Nancy has steadily since Blair appt were cancelled, caregiver states that Nancy stays in room while they are there.

## 2015-02-11 ENCOUNTER — Other Ambulatory Visit: Payer: Self-pay

## 2015-02-11 NOTE — Patient Outreach (Signed)
[place] [facility]) Care Management  02/11/2015  CANDIDA VETTER [birth year] [id number]   Outreach call # 2 to patient's caregiver/daughter/[name] Nyoka Cowden; not reached. H/o Hospice per Epic MR review.   Plan:  RN CM left HIPAA compliant voice message with name and number requesting call back.  RN CM will schedule for next outreach call within one week.  RN CM will follow-up with patient or daughter to verify Palliative versus Hospice Services.   Mariann Laster, RN, BSN, [facility], CCM  [employer] Management Coordinator [phone] Direct [phone] Cell [phone] Office [phone] Fax

## 2015-02-12 ENCOUNTER — Other Ambulatory Visit: Payer: Self-pay

## 2015-02-12 NOTE — Patient Outreach (Signed)
[facility] [facility]) Care Management  02/12/2015  Nancy Blair 11/06/1935 [id number]   Outreach call # 3 to patient. H/o no response back from attempted calls to caregiver/daughter/Nancy Blair. H/o outreach to daughter per patient request and to verify Palliative versus Hospice Services.  H/o Hospice per Epic MR review.  Patient states she lives at [facility]F due to h/o past falls relating to being on medications.  Medications:  Patient states more than 10  Flu vaccine:  yes.  Morning BS:  115 and checking BS 3 times a day.  Weight: 130    Plan:  RN CM notified Day Assistant to close case:  Patient eligible for services but refused to participate.  RN CM sent case closure letter to MD:  Dr. Birdie Blair.   Nancy Laster, RN, BSN, [facility], CCM  [employer] Management Coordinator [phone] Direct [phone] Cell [phone] Office [phone] Fax

## 2015-02-17 ENCOUNTER — Telehealth: Payer: Self-pay | Admitting: Family Medicine

## 2015-02-17 NOTE — Telephone Encounter (Signed)
Caller name: Milus Banister  Relation to pt: [place] Call back number: [phone]    Reason for call:  Inform PCP patient will be transfer to [facility] for end of life.

## 2015-02-23 ENCOUNTER — Telehealth: Payer: Self-pay | Admitting: *Deleted

## 2015-02-23 NOTE — Telephone Encounter (Signed)
Forwarded to Dr. [name] for review/signature. JG//CMA

## 2015-02-24 NOTE — Telephone Encounter (Signed)
Signed forms faxed to [place] successfully. Sent for scanning. JG//CMA

## 2015-03-18 NOTE — Patient Outreach (Signed)
[place] [facility]) Care Management  2015/03/09  Nancy Blair 1936-03-17 [id number]   Notification from Mariann Laster, RN to close case due to patient refused [place] Management services.  Thanks, Ronnell Freshwater. [place], [place] Assistant Phone: [phone] Fax: [phone]

## 2015-03-18 DEATH — deceased

## 2015-12-08 IMAGING — XA IR IVC FILTER PLMT / S&I /IMG GUID/MOD SED
3 series · 11 of 11 positions shown · IV contrast (CARBON DIOXIDE)
Comparison: none

CLINICAL DATA: Lower extremity DVT. Exacerbation of lower GI bleed
on anticoagulation, a relative contraindication. Caval filtration is
requested. Renal insufficiency.

EXAM:
INFERIOR VENACAVOGRAM
IVC FILTER PLACEMENT UNDER FLUOROSCOPY
FLUOROSCOPY TIME:  0.7 minutes, 153.79 uWymO DAP
TECHNIQUE: The procedure, risks (including but not limited to bleeding,
infection, organ damage ), benefits, and alternatives were explained
to the patient. Questions regarding the procedure were encouraged
and answered. The patient understands and consents to the procedure.
Patency of the right IJ vein was confirmed with ultrasound with
image documentation. An appropriate skin site was determined. Skin
site was marked, prepped with chlorhexidine, and draped using
maximum barrier technique. The region was infiltrated locally with
1% lidocaine.

[Series 1: co 2 · 4 of 12 frames shown (1 of 2)]
[frame 2/12]
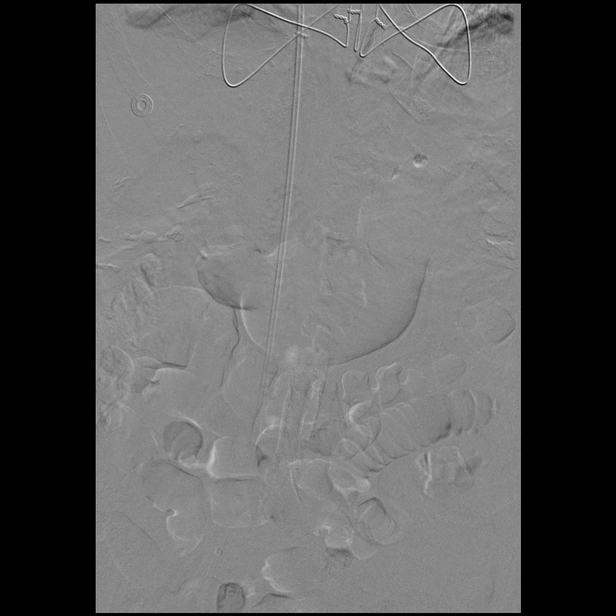
[frame 7/12]
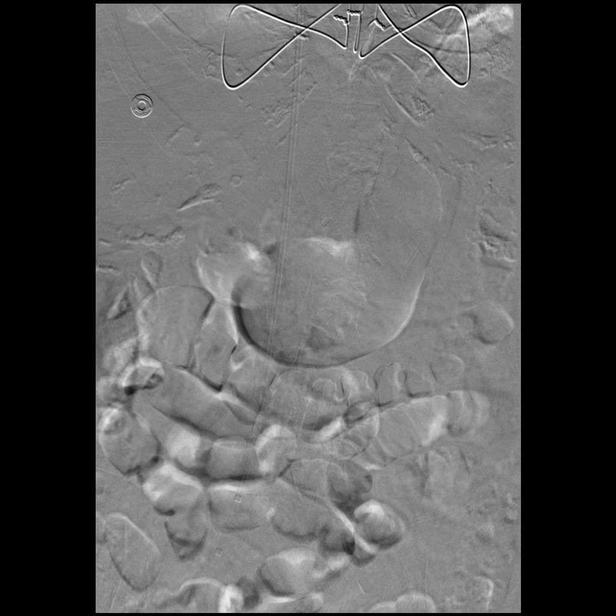
[frame 11/12]
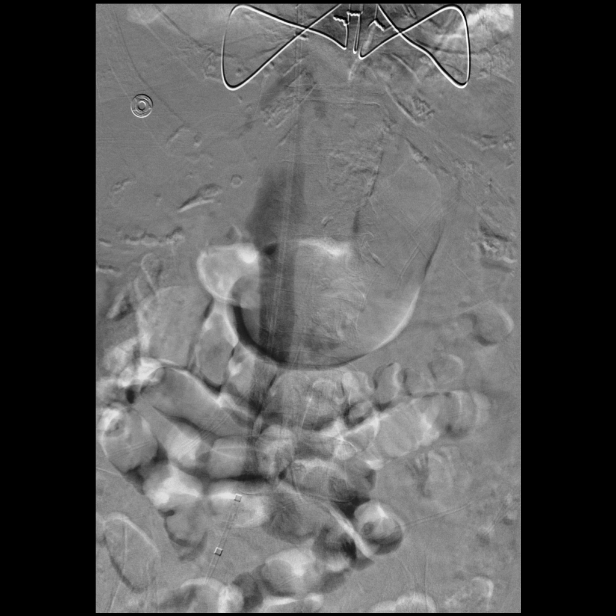
[frame 12/12]
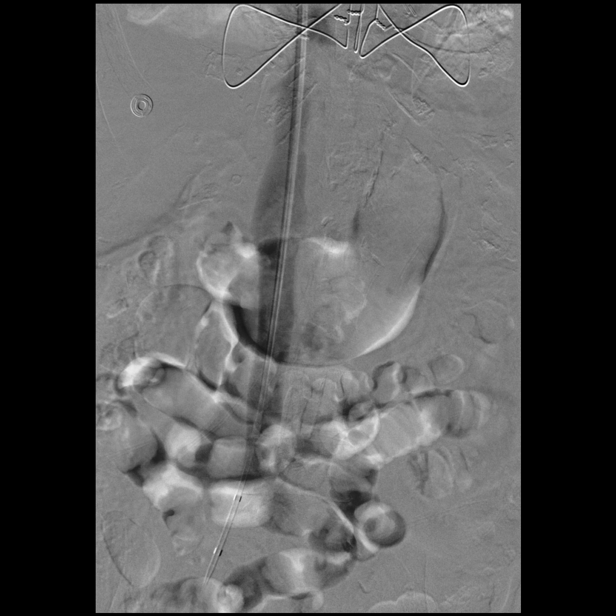

[Series 2: co 2 · 4 of 13 frames shown (2 of 2)]
[frame 2/13]
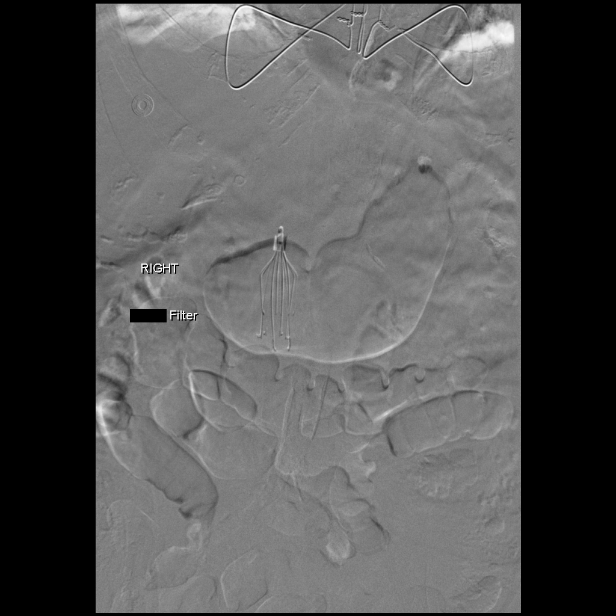
[frame 7/13]
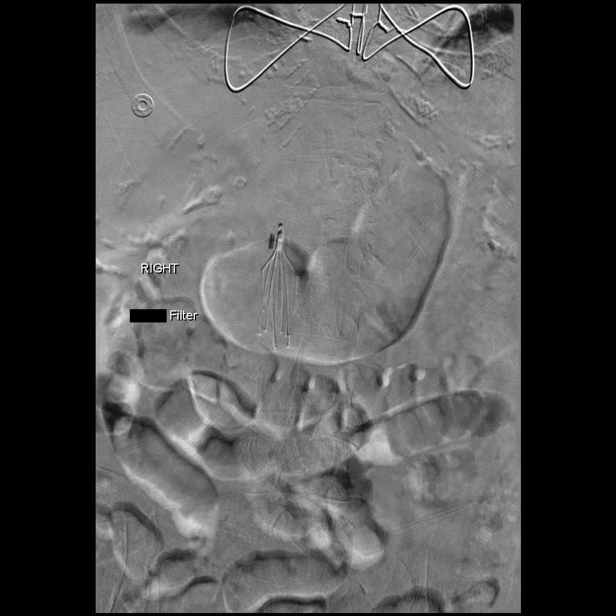
[frame 8/13]
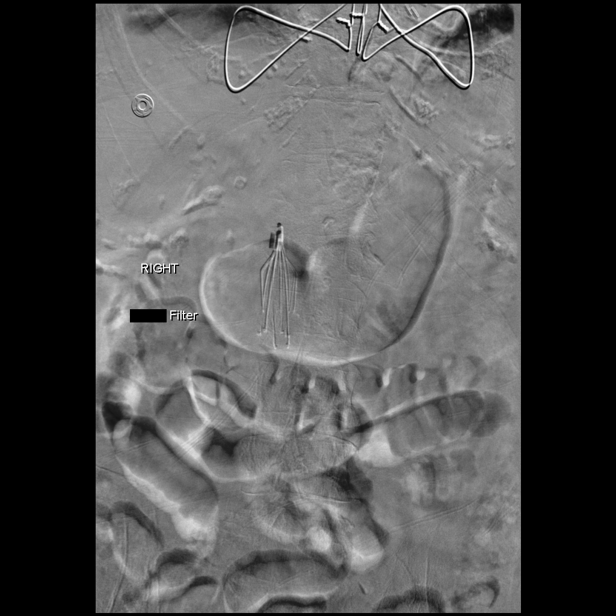
[frame 12/13]
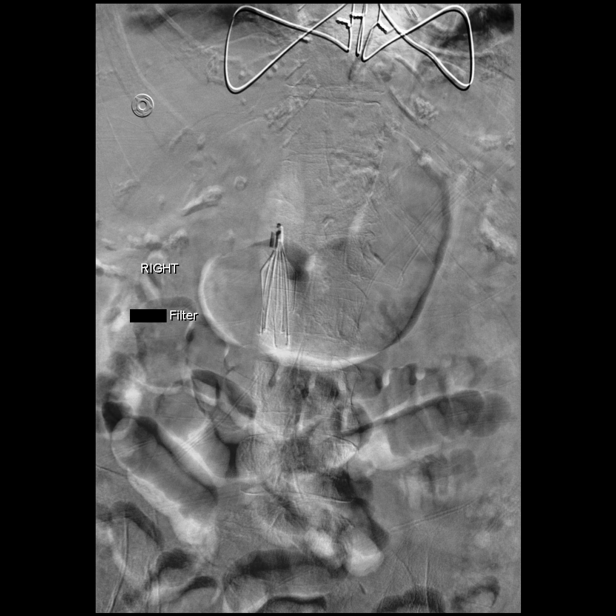

[Series 300: line placements · 3 of 3 slices shown]
[im 1/3]
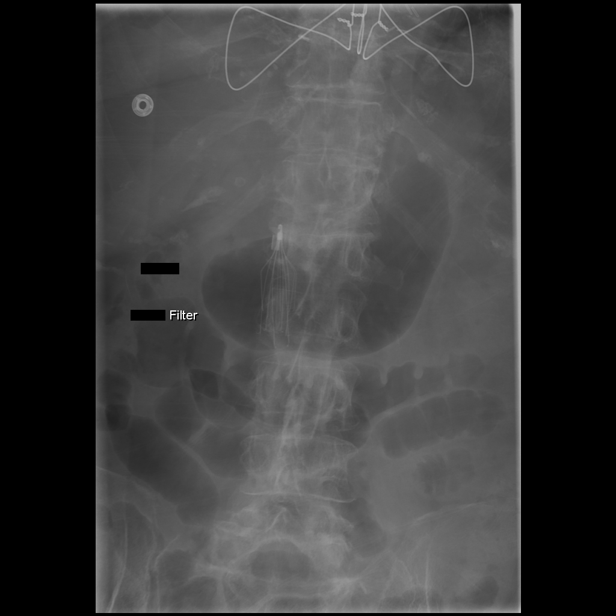
[im 2/3]
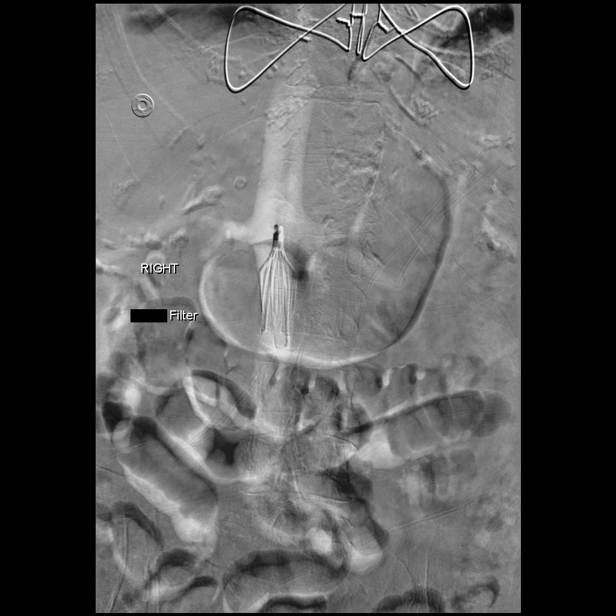
[im 3/3]
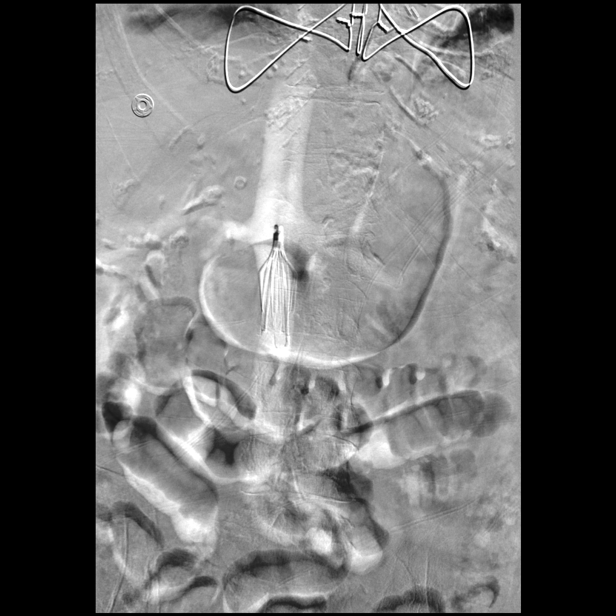

[11 of 11 positions shown; findings below may reference images not displayed]

Intravenous Fentanyl and Versed were administered as conscious
sedation during continuous cardiorespiratory monitoring by the
radiology RN, with a total moderate sedation time of 8 minutes.

Under real-time ultrasound guidance, the right IJ vein was accessed
with a 21 gauge micropuncture needle; the needle tip within the vein
was confirmed with ultrasound image documentation. The needle was
exchanged over a 018 guidewire for a transitional dilator, which
allow advancement of the Benson wire into the IVC. A long 6 French
vascular sheath was placed for inferior venacavography using CO2.
This demonstrated no caval thrombus. Renal vein inflows were
evident.

The Denali IVC filter was advanced through the sheath and
successfully deployed under fluoroscopy at the L1-2 level. Followup
CO2 cavagram demonstrates stable filter position and no evident
complication. The sheath was removed and hemostasis achieved at the
site. No immediate complication.
IMPRESSION: 1. Normal IVC. No thrombus or significant anatomic variation.
2. Technically successful infrarenal IVC filter placement. This is a
retrievable model.

## 2015-12-29 IMAGING — CR DG CHEST 2V
2 series · 2 of 2 positions shown · non-contrast
Comparison: 04/27/2014

CLINICAL DATA: Shortness of breath, COPD

EXAM:
CHEST  2 VIEW

[w chest pa]
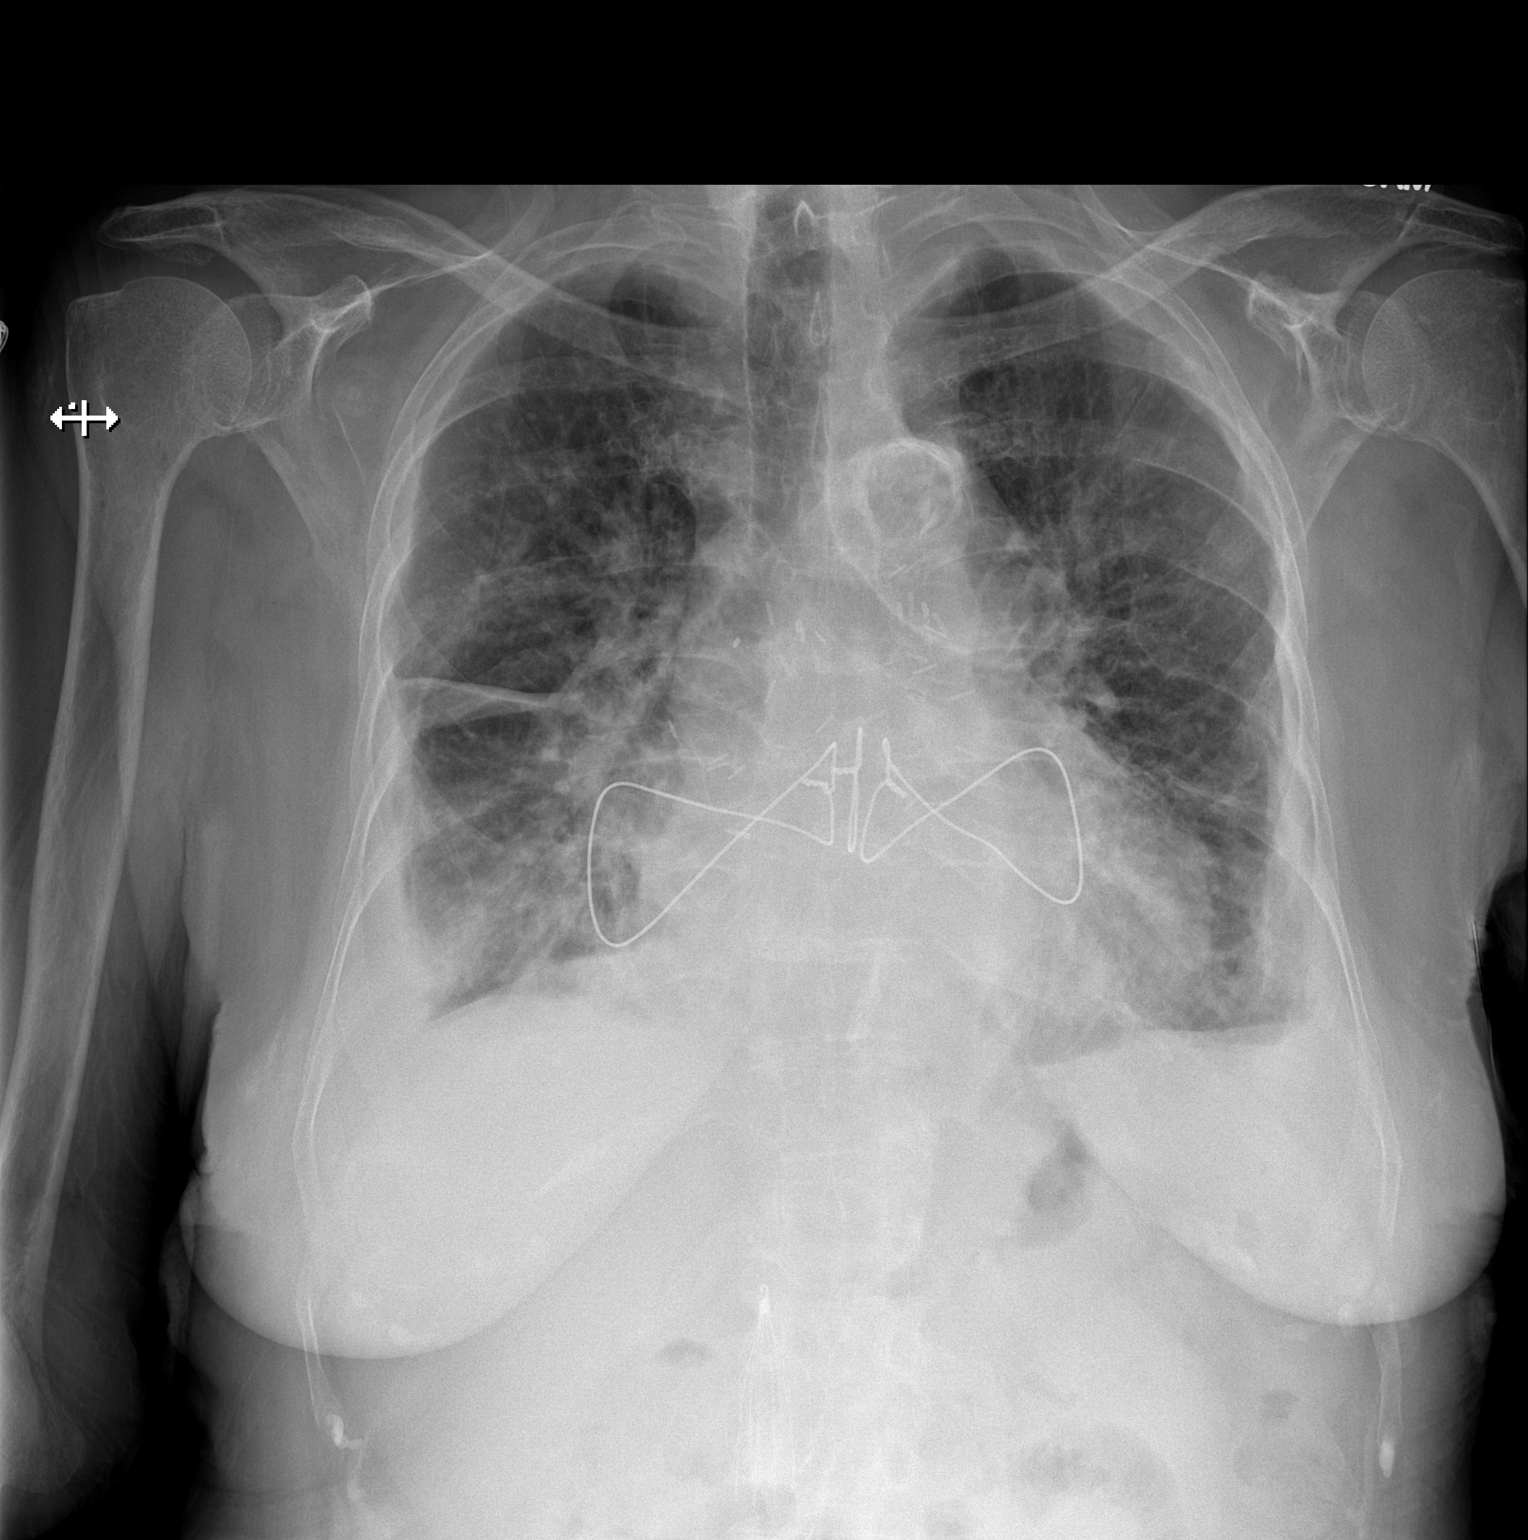

[w chest lat]
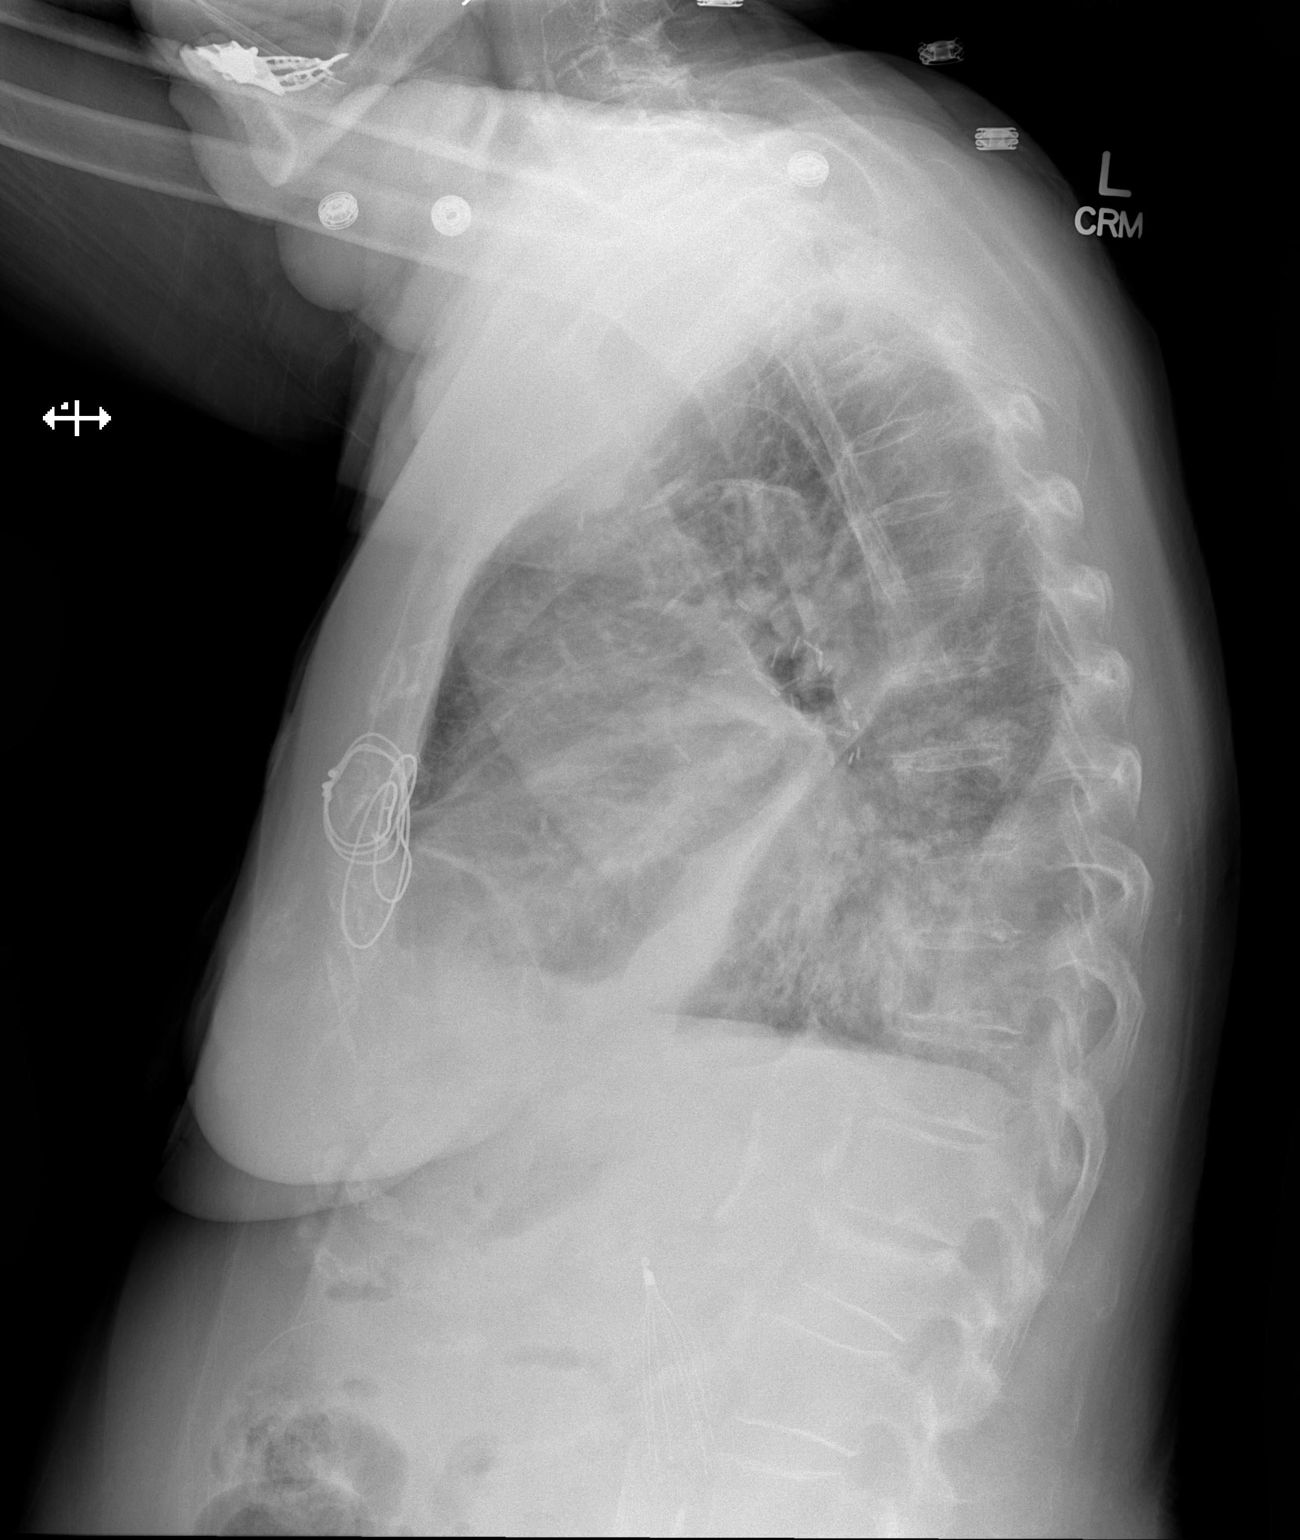

[2 of 2 positions shown; findings below may reference images not displayed]

FINDINGS: Cardiomegaly is noted. Status post median sternotomy. Central mild
vascular congestion and mild perihilar interstitial prominence
suspicious for mild interstitial edema. Small bilateral loculated
pleural effusion with bilateral basilar atelectasis or infiltrate.
Small amount of fluid noted in right major fissure.

Again noted atherosclerotic calcifications of thoracic aorta.
Thoracic spine osteopenia. There is mild compression deformity in
upper lumbar spine of indeterminate age.
IMPRESSION: Central mild vascular congestion and mild perihilar interstitial
prominence suspicious for mild interstitial edema. Small bilateral
loculated pleural effusion with bilateral basilar atelectasis or
infiltrate. Small amount of fluid noted in right major fissure. Mild
compression deformity upper lumbar spine of indeterminate age.
Clinical correlation is necessary
